# Patient Record
Sex: Female | Born: 1975 | Race: Black or African American | Hispanic: No | Marital: Married | Smoking: Never smoker
Health system: Southern US, Community
[De-identification: ages and names within clinical notes are randomized; demographics above are authoritative.]

## PROBLEM LIST (undated history)

## (undated) DIAGNOSIS — D573 Sickle-cell trait: Secondary | ICD-10-CM

## (undated) DIAGNOSIS — IMO0002 Reserved for concepts with insufficient information to code with codable children: Secondary | ICD-10-CM

## (undated) DIAGNOSIS — M1990 Unspecified osteoarthritis, unspecified site: Secondary | ICD-10-CM

## (undated) DIAGNOSIS — G43909 Migraine, unspecified, not intractable, without status migrainosus: Secondary | ICD-10-CM

## (undated) DIAGNOSIS — H269 Unspecified cataract: Secondary | ICD-10-CM

## (undated) DIAGNOSIS — K219 Gastro-esophageal reflux disease without esophagitis: Secondary | ICD-10-CM

## (undated) DIAGNOSIS — N63 Unspecified lump in unspecified breast: Secondary | ICD-10-CM

## (undated) HISTORY — PX: WISDOM TOOTH EXTRACTION: SHX21

## (undated) HISTORY — DX: Migraine, unspecified, not intractable, without status migrainosus: G43.909

## (undated) HISTORY — DX: Sickle-cell trait: D57.3

## (undated) HISTORY — DX: Unspecified osteoarthritis, unspecified site: M19.90

## (undated) HISTORY — DX: Unspecified cataract: H26.9

---

## 1999-06-09 ENCOUNTER — Encounter: Admission: RE | Admit: 1999-06-09 | Discharge: 1999-06-23 | Payer: Self-pay | Admitting: Internal Medicine

## 1999-09-04 ENCOUNTER — Encounter (INDEPENDENT_AMBULATORY_CARE_PROVIDER_SITE_OTHER): Payer: Self-pay | Admitting: *Deleted

## 1999-09-04 ENCOUNTER — Ambulatory Visit (HOSPITAL_BASED_OUTPATIENT_CLINIC_OR_DEPARTMENT_OTHER): Admission: RE | Admit: 1999-09-04 | Discharge: 1999-09-04 | Payer: Self-pay | Admitting: Orthopedic Surgery

## 1999-09-04 HISTORY — PX: WRIST GANGLION EXCISION: SHX840

## 2000-09-26 ENCOUNTER — Encounter: Payer: Self-pay | Admitting: Internal Medicine

## 2000-09-26 ENCOUNTER — Ambulatory Visit (HOSPITAL_COMMUNITY): Admission: RE | Admit: 2000-09-26 | Discharge: 2000-09-26 | Payer: Self-pay | Admitting: Internal Medicine

## 2001-09-18 ENCOUNTER — Other Ambulatory Visit: Admission: RE | Admit: 2001-09-18 | Discharge: 2001-09-18 | Payer: Self-pay | Admitting: Obstetrics and Gynecology

## 2002-01-14 ENCOUNTER — Inpatient Hospital Stay (HOSPITAL_COMMUNITY): Admission: AD | Admit: 2002-01-14 | Discharge: 2002-01-17 | Payer: Self-pay | Admitting: Obstetrics and Gynecology

## 2002-03-17 ENCOUNTER — Emergency Department (HOSPITAL_COMMUNITY): Admission: EM | Admit: 2002-03-17 | Discharge: 2002-03-17 | Payer: Self-pay | Admitting: Emergency Medicine

## 2002-08-25 ENCOUNTER — Other Ambulatory Visit: Admission: RE | Admit: 2002-08-25 | Discharge: 2002-08-25 | Payer: Self-pay | Admitting: Obstetrics and Gynecology

## 2003-05-25 ENCOUNTER — Ambulatory Visit (HOSPITAL_BASED_OUTPATIENT_CLINIC_OR_DEPARTMENT_OTHER): Admission: RE | Admit: 2003-05-25 | Discharge: 2003-05-25 | Payer: Self-pay | Admitting: Neurology

## 2003-09-06 ENCOUNTER — Encounter (INDEPENDENT_AMBULATORY_CARE_PROVIDER_SITE_OTHER): Payer: Self-pay | Admitting: *Deleted

## 2003-09-06 ENCOUNTER — Ambulatory Visit (HOSPITAL_BASED_OUTPATIENT_CLINIC_OR_DEPARTMENT_OTHER): Admission: RE | Admit: 2003-09-06 | Discharge: 2003-09-06 | Payer: Self-pay | Admitting: Orthopedic Surgery

## 2003-09-06 ENCOUNTER — Ambulatory Visit (HOSPITAL_COMMUNITY): Admission: RE | Admit: 2003-09-06 | Discharge: 2003-09-06 | Payer: Self-pay | Admitting: Orthopedic Surgery

## 2003-09-06 HISTORY — PX: WRIST GANGLION EXCISION: SHX840

## 2003-09-06 HISTORY — PX: DE QUERVAIN'S RELEASE: SHX1439

## 2003-09-09 ENCOUNTER — Other Ambulatory Visit: Admission: RE | Admit: 2003-09-09 | Discharge: 2003-09-09 | Payer: Self-pay | Admitting: Obstetrics and Gynecology

## 2004-06-28 ENCOUNTER — Ambulatory Visit: Payer: Self-pay | Admitting: Internal Medicine

## 2004-07-21 ENCOUNTER — Ambulatory Visit: Payer: Self-pay | Admitting: Internal Medicine

## 2004-07-28 ENCOUNTER — Ambulatory Visit: Payer: Self-pay | Admitting: Internal Medicine

## 2004-09-11 ENCOUNTER — Other Ambulatory Visit: Admission: RE | Admit: 2004-09-11 | Discharge: 2004-09-11 | Payer: Self-pay | Admitting: Obstetrics and Gynecology

## 2004-09-13 ENCOUNTER — Ambulatory Visit: Payer: Self-pay | Admitting: Internal Medicine

## 2004-10-27 ENCOUNTER — Ambulatory Visit: Payer: Self-pay | Admitting: Internal Medicine

## 2005-01-01 ENCOUNTER — Ambulatory Visit: Payer: Self-pay | Admitting: Internal Medicine

## 2005-01-17 ENCOUNTER — Ambulatory Visit: Payer: Self-pay | Admitting: Internal Medicine

## 2005-06-08 ENCOUNTER — Ambulatory Visit: Payer: Self-pay | Admitting: Internal Medicine

## 2005-12-26 ENCOUNTER — Inpatient Hospital Stay (HOSPITAL_COMMUNITY): Admission: AD | Admit: 2005-12-26 | Discharge: 2005-12-28 | Payer: Self-pay | Admitting: Obstetrics and Gynecology

## 2005-12-27 ENCOUNTER — Encounter (INDEPENDENT_AMBULATORY_CARE_PROVIDER_SITE_OTHER): Payer: Self-pay | Admitting: Specialist

## 2005-12-27 HISTORY — PX: TUBAL LIGATION: SHX77

## 2006-02-05 ENCOUNTER — Other Ambulatory Visit: Admission: RE | Admit: 2006-02-05 | Discharge: 2006-02-05 | Payer: Self-pay | Admitting: Obstetrics and Gynecology

## 2006-02-14 ENCOUNTER — Encounter: Admission: RE | Admit: 2006-02-14 | Discharge: 2006-02-14 | Payer: Self-pay | Admitting: Obstetrics and Gynecology

## 2006-04-03 ENCOUNTER — Ambulatory Visit: Payer: Self-pay | Admitting: Internal Medicine

## 2006-09-05 ENCOUNTER — Encounter: Admission: RE | Admit: 2006-09-05 | Discharge: 2006-09-05 | Payer: Self-pay | Admitting: Obstetrics and Gynecology

## 2006-10-02 ENCOUNTER — Ambulatory Visit: Payer: Self-pay | Admitting: Internal Medicine

## 2006-10-02 LAB — CONVERTED CEMR LAB
ALT: 14 units/L (ref 0–40)
Alkaline Phosphatase: 39 units/L (ref 39–117)
BUN: 11 mg/dL (ref 6–23)
Bilirubin Urine: NEGATIVE
Bilirubin, Direct: 0.1 mg/dL (ref 0.0–0.3)
Calcium: 9.2 mg/dL (ref 8.4–10.5)
Cholesterol: 155 mg/dL (ref 0–200)
Eosinophils Absolute: 0.1 10*3/uL (ref 0.0–0.6)
GFR calc Af Amer: 95 mL/min
GFR calc non Af Amer: 78 mL/min
Glucose, Bld: 95 mg/dL (ref 70–99)
Hemoglobin, Urine: NEGATIVE
Lymphocytes Relative: 34.1 % (ref 12.0–46.0)
MCV: 88.8 fL (ref 78.0–100.0)
Monocytes Relative: 6.7 % (ref 3.0–11.0)
Neutro Abs: 2.3 10*3/uL (ref 1.4–7.7)
Platelets: 212 10*3/uL (ref 150–400)
Potassium: 4.3 meq/L (ref 3.5–5.1)
Total CHOL/HDL Ratio: 2.5
Triglycerides: 31 mg/dL (ref 0–149)
Urine Glucose: NEGATIVE mg/dL

## 2006-10-07 ENCOUNTER — Ambulatory Visit: Payer: Self-pay | Admitting: Internal Medicine

## 2006-10-16 ENCOUNTER — Ambulatory Visit: Payer: Self-pay | Admitting: Internal Medicine

## 2006-10-16 LAB — CONVERTED CEMR LAB
Rhuematoid fact SerPl-aCnc: <20 intl units/mL — ABNORMAL LOW (ref 0.0–20.0)
Sed Rate: 9 mm/hr (ref 0–25)
Total CK: 66 units/L (ref 7–177)

## 2007-01-02 ENCOUNTER — Ambulatory Visit: Payer: Self-pay | Admitting: Endocrinology

## 2007-01-03 ENCOUNTER — Encounter: Payer: Self-pay | Admitting: Endocrinology

## 2007-03-06 ENCOUNTER — Ambulatory Visit: Payer: Self-pay | Admitting: Internal Medicine

## 2007-03-27 ENCOUNTER — Ambulatory Visit: Payer: Self-pay | Admitting: Internal Medicine

## 2007-05-20 ENCOUNTER — Ambulatory Visit: Payer: Self-pay | Admitting: Internal Medicine

## 2007-05-20 ENCOUNTER — Encounter: Payer: Self-pay | Admitting: Internal Medicine

## 2007-05-20 DIAGNOSIS — D573 Sickle-cell trait: Secondary | ICD-10-CM | POA: Insufficient documentation

## 2007-05-20 DIAGNOSIS — J309 Allergic rhinitis, unspecified: Secondary | ICD-10-CM | POA: Insufficient documentation

## 2007-05-20 DIAGNOSIS — G43009 Migraine without aura, not intractable, without status migrainosus: Secondary | ICD-10-CM | POA: Insufficient documentation

## 2007-05-31 ENCOUNTER — Encounter: Payer: Self-pay | Admitting: Endocrinology

## 2007-05-31 DIAGNOSIS — G4700 Insomnia, unspecified: Secondary | ICD-10-CM | POA: Insufficient documentation

## 2007-06-04 ENCOUNTER — Encounter: Payer: Self-pay | Admitting: Internal Medicine

## 2007-06-04 ENCOUNTER — Ambulatory Visit: Payer: Self-pay | Admitting: Internal Medicine

## 2007-06-18 ENCOUNTER — Telehealth (INDEPENDENT_AMBULATORY_CARE_PROVIDER_SITE_OTHER): Payer: Self-pay | Admitting: *Deleted

## 2007-07-03 ENCOUNTER — Ambulatory Visit: Payer: Self-pay | Admitting: Internal Medicine

## 2007-07-03 DIAGNOSIS — J019 Acute sinusitis, unspecified: Secondary | ICD-10-CM | POA: Insufficient documentation

## 2007-07-24 ENCOUNTER — Ambulatory Visit: Payer: Self-pay | Admitting: Internal Medicine

## 2007-07-31 ENCOUNTER — Encounter: Payer: Self-pay | Admitting: Internal Medicine

## 2007-09-01 ENCOUNTER — Telehealth (INDEPENDENT_AMBULATORY_CARE_PROVIDER_SITE_OTHER): Payer: Self-pay | Admitting: *Deleted

## 2007-09-03 ENCOUNTER — Ambulatory Visit: Payer: Self-pay | Admitting: Internal Medicine

## 2007-09-03 DIAGNOSIS — E559 Vitamin D deficiency, unspecified: Secondary | ICD-10-CM

## 2007-09-03 HISTORY — DX: Vitamin D deficiency, unspecified: E55.9

## 2007-09-03 LAB — CONVERTED CEMR LAB
ALT: 14 units/L (ref 0–35)
AST: 14 units/L (ref 0–37)
Albumin: 3.9 g/dL (ref 3.5–5.2)
Basophils Absolute: 0 10*3/uL (ref 0.0–0.1)
Bilirubin Urine: NEGATIVE
Bilirubin, Direct: 0.1 mg/dL (ref 0.0–0.3)
Calcium: 9.4 mg/dL (ref 8.4–10.5)
Chloride: 105 meq/L (ref 96–112)
Eosinophils Absolute: 0.1 10*3/uL (ref 0.0–0.6)
GFR calc Af Amer: 94 mL/min
GFR calc non Af Amer: 78 mL/min
Glucose, Bld: 113 mg/dL — ABNORMAL HIGH (ref 70–99)
Ketones, ur: NEGATIVE mg/dL
Lymphocytes Relative: 35.6 % (ref 12.0–46.0)
MCHC: 34.9 g/dL (ref 30.0–36.0)
MCV: 87.9 fL (ref 78.0–100.0)
Monocytes Relative: 6.5 % (ref 3.0–11.0)
Neutro Abs: 3.3 10*3/uL (ref 1.4–7.7)
Nitrite: NEGATIVE
Platelets: 242 10*3/uL (ref 150–400)
RBC: 3.98 M/uL (ref 3.87–5.11)
Urine Glucose: NEGATIVE mg/dL
Vitamin B-12: 415 pg/mL (ref 211–911)
WBC: 5.9 10*3/uL (ref 4.5–10.5)

## 2007-09-06 ENCOUNTER — Encounter: Payer: Self-pay | Admitting: Internal Medicine

## 2007-09-09 LAB — CONVERTED CEMR LAB
ALT: 14 units/L (ref 0–35)
AST: 14 units/L (ref 0–37)
Alkaline Phosphatase: 46 units/L (ref 39–117)
BUN: 10 mg/dL (ref 6–23)
Basophils Relative: 0.5 % (ref 0.0–1.0)
Bilirubin Urine: NEGATIVE
CO2: 28 meq/L (ref 19–32)
Calcium: 9.4 mg/dL (ref 8.4–10.5)
Chloride: 105 meq/L (ref 96–112)
Creatinine, Ser: 0.9 mg/dL (ref 0.4–1.2)
Crystals: NEGATIVE
Eosinophils Relative: 1.6 % (ref 0.0–5.0)
GFR calc Af Amer: 94 mL/min
Glucose, Bld: 113 mg/dL — ABNORMAL HIGH (ref 70–99)
Monocytes Relative: 6.5 % (ref 3.0–11.0)
Mucus, UA: NEGATIVE
Nitrite: NEGATIVE
Platelets: 242 10*3/uL (ref 150–400)
RBC: 3.98 M/uL (ref 3.87–5.11)
RDW: 12.8 % (ref 11.5–14.6)
Specific Gravity, Urine: 1.025 (ref 1.000–1.03)
Total Protein, Urine: NEGATIVE mg/dL
Total Protein: 6.5 g/dL (ref 6.0–8.3)
Urine Glucose: NEGATIVE mg/dL
Vit D, 1,25-Dihydroxy: 7 — ABNORMAL LOW (ref 30–89)
WBC: 5.9 10*3/uL (ref 4.5–10.5)
pH: 5 (ref 5.0–8.0)

## 2007-09-10 ENCOUNTER — Encounter: Payer: Self-pay | Admitting: Internal Medicine

## 2007-09-10 ENCOUNTER — Telehealth: Payer: Self-pay | Admitting: Internal Medicine

## 2007-10-21 ENCOUNTER — Telehealth: Payer: Self-pay | Admitting: Internal Medicine

## 2007-12-10 ENCOUNTER — Ambulatory Visit: Payer: Self-pay | Admitting: Internal Medicine

## 2007-12-10 DIAGNOSIS — R635 Abnormal weight gain: Secondary | ICD-10-CM | POA: Insufficient documentation

## 2008-01-23 ENCOUNTER — Ambulatory Visit: Payer: Self-pay | Admitting: Internal Medicine

## 2008-01-23 DIAGNOSIS — R110 Nausea: Secondary | ICD-10-CM | POA: Insufficient documentation

## 2008-03-26 ENCOUNTER — Ambulatory Visit: Payer: Self-pay | Admitting: Internal Medicine

## 2008-03-26 DIAGNOSIS — R079 Chest pain, unspecified: Secondary | ICD-10-CM | POA: Insufficient documentation

## 2008-05-10 ENCOUNTER — Ambulatory Visit: Payer: Self-pay | Admitting: Internal Medicine

## 2008-05-10 DIAGNOSIS — R059 Cough, unspecified: Secondary | ICD-10-CM | POA: Insufficient documentation

## 2008-05-10 DIAGNOSIS — R05 Cough: Secondary | ICD-10-CM | POA: Insufficient documentation

## 2008-05-10 DIAGNOSIS — J158 Pneumonia due to other specified bacteria: Secondary | ICD-10-CM | POA: Insufficient documentation

## 2008-05-11 ENCOUNTER — Encounter: Payer: Self-pay | Admitting: Internal Medicine

## 2008-05-19 ENCOUNTER — Telehealth: Payer: Self-pay | Admitting: Internal Medicine

## 2008-05-21 ENCOUNTER — Ambulatory Visit: Payer: Self-pay | Admitting: Internal Medicine

## 2008-05-21 ENCOUNTER — Encounter: Payer: Self-pay | Admitting: Internal Medicine

## 2008-06-15 ENCOUNTER — Telehealth: Payer: Self-pay | Admitting: Internal Medicine

## 2008-06-15 ENCOUNTER — Ambulatory Visit: Payer: Self-pay | Admitting: Internal Medicine

## 2008-07-02 ENCOUNTER — Encounter: Admission: RE | Admit: 2008-07-02 | Discharge: 2008-07-02 | Payer: Self-pay | Admitting: Internal Medicine

## 2008-08-01 ENCOUNTER — Telehealth: Payer: Self-pay | Admitting: Family Medicine

## 2008-09-21 ENCOUNTER — Telehealth: Payer: Self-pay | Admitting: Internal Medicine

## 2008-10-26 ENCOUNTER — Telehealth: Payer: Self-pay | Admitting: Internal Medicine

## 2008-10-27 ENCOUNTER — Ambulatory Visit: Payer: Self-pay | Admitting: Internal Medicine

## 2008-10-28 LAB — CONVERTED CEMR LAB
Albumin: 3.7 g/dL (ref 3.5–5.2)
Alkaline Phosphatase: 39 units/L (ref 39–117)
BUN: 9 mg/dL (ref 6–23)
Basophils Absolute: 0 10*3/uL (ref 0.0–0.1)
Basophils Relative: 0.3 % (ref 0.0–3.0)
CO2: 30 meq/L (ref 19–32)
Calcium: 9 mg/dL (ref 8.4–10.5)
Eosinophils Absolute: 0.1 10*3/uL (ref 0.0–0.7)
Glucose, Bld: 93 mg/dL (ref 70–99)
HCT: 34.3 % — ABNORMAL LOW (ref 36.0–46.0)
HDL: 70.7 mg/dL (ref >39.00)
Hemoglobin, Urine: NEGATIVE
Leukocytes, UA: NEGATIVE
Lymphocytes Relative: 49.7 % — ABNORMAL HIGH (ref 12.0–46.0)
Lymphs Abs: 1.7 10*3/uL (ref 0.7–4.0)
MCHC: 33.8 g/dL (ref 30.0–36.0)
Monocytes Absolute: 0.2 10*3/uL (ref 0.1–1.0)
Neutro Abs: 1.3 10*3/uL — ABNORMAL LOW (ref 1.4–7.7)
RBC: 3.86 M/uL — ABNORMAL LOW (ref 3.87–5.11)
RDW: 13.1 % (ref 11.5–14.6)
Sodium: 142 meq/L (ref 135–145)
Specific Gravity, Urine: 1.01 (ref 1.000–1.030)
TSH: 1.51 microintl units/mL (ref 0.35–5.50)
Total CHOL/HDL Ratio: 2
Total Protein: 6.5 g/dL (ref 6.0–8.3)
Urobilinogen, UA: 0.2 (ref 0.0–1.0)
VLDL: 6.2 mg/dL (ref 0.0–40.0)
WBC: 3.3 10*3/uL — ABNORMAL LOW (ref 4.5–10.5)

## 2008-11-01 ENCOUNTER — Ambulatory Visit: Payer: Self-pay | Admitting: Internal Medicine

## 2008-11-01 DIAGNOSIS — R209 Unspecified disturbances of skin sensation: Secondary | ICD-10-CM | POA: Insufficient documentation

## 2008-11-02 LAB — CONVERTED CEMR LAB
Folate: 9 ng/mL
Vitamin B-12: 417 pg/mL (ref 211–911)

## 2008-12-08 ENCOUNTER — Ambulatory Visit: Payer: Self-pay | Admitting: Internal Medicine

## 2009-01-05 ENCOUNTER — Encounter: Admission: RE | Admit: 2009-01-05 | Discharge: 2009-01-05 | Payer: Self-pay | Admitting: Obstetrics and Gynecology

## 2009-04-29 ENCOUNTER — Telehealth: Payer: Self-pay | Admitting: Internal Medicine

## 2009-05-28 ENCOUNTER — Ambulatory Visit: Payer: Self-pay | Admitting: Family Medicine

## 2009-06-20 ENCOUNTER — Ambulatory Visit: Payer: Self-pay | Admitting: Internal Medicine

## 2009-06-30 ENCOUNTER — Ambulatory Visit: Payer: Self-pay | Admitting: Internal Medicine

## 2009-06-30 DIAGNOSIS — J40 Bronchitis, not specified as acute or chronic: Secondary | ICD-10-CM | POA: Insufficient documentation

## 2009-10-14 ENCOUNTER — Ambulatory Visit: Payer: Self-pay | Admitting: Internal Medicine

## 2010-01-20 ENCOUNTER — Ambulatory Visit: Payer: Self-pay | Admitting: Internal Medicine

## 2010-02-16 ENCOUNTER — Ambulatory Visit: Payer: Self-pay | Admitting: Internal Medicine

## 2010-02-16 DIAGNOSIS — J069 Acute upper respiratory infection, unspecified: Secondary | ICD-10-CM | POA: Insufficient documentation

## 2010-02-16 DIAGNOSIS — R350 Frequency of micturition: Secondary | ICD-10-CM | POA: Insufficient documentation

## 2010-02-16 LAB — CONVERTED CEMR LAB
Bilirubin Urine: NEGATIVE
Hemoglobin, Urine: NEGATIVE
Nitrite: NEGATIVE
Total Protein, Urine: NEGATIVE mg/dL
Urine Glucose: NEGATIVE mg/dL
pH: 6 (ref 5.0–8.0)

## 2010-05-22 ENCOUNTER — Ambulatory Visit: Payer: Self-pay | Admitting: Internal Medicine

## 2010-05-22 LAB — CONVERTED CEMR LAB
BUN: 14 mg/dL (ref 6–23)
Basophils Relative: 0.7 % (ref 0.0–3.0)
CO2: 27 meq/L (ref 19–32)
Chloride: 107 meq/L (ref 96–112)
Creatinine, Ser: 0.9 mg/dL (ref 0.4–1.2)
Eosinophils Absolute: 0.1 10*3/uL (ref 0.0–0.7)
Eosinophils Relative: 1.8 % (ref 0.0–5.0)
HCT: 35.2 % — ABNORMAL LOW (ref 36.0–46.0)
Lymphs Abs: 1.7 10*3/uL (ref 0.7–4.0)
MCHC: 34.1 g/dL (ref 30.0–36.0)
MCV: 90.3 fL (ref 78.0–100.0)
Monocytes Absolute: 0.5 10*3/uL (ref 0.1–1.0)
Potassium: 4.8 meq/L (ref 3.5–5.1)
RBC: 3.89 M/uL (ref 3.87–5.11)
WBC: 5.9 10*3/uL (ref 4.5–10.5)

## 2010-05-26 ENCOUNTER — Ambulatory Visit: Payer: Self-pay | Admitting: Internal Medicine

## 2010-06-21 ENCOUNTER — Telehealth: Payer: Self-pay | Admitting: Internal Medicine

## 2010-08-30 ENCOUNTER — Telehealth: Payer: Self-pay | Admitting: Internal Medicine

## 2010-09-03 ENCOUNTER — Encounter: Payer: Self-pay | Admitting: Obstetrics and Gynecology

## 2010-09-14 NOTE — Assessment & Plan Note (Signed)
Summary: 4 MTH FU  STC   Vital Signs:  Patient profile:   35 year old female Height:      66 inches Weight:      176 pounds BMI:     28.51 Temp:     98.5 degrees F oral Pulse rate:   84 / minute Pulse rhythm:   regular Resp:     16 per minute BP sitting:   110 / 60  (left arm) Cuff size:   regular  Vitals Entered By: Lanier Prude, CMA(AAMA) (May 26, 2010 8:56 AM) CC: 4 mo f/u Is Patient Diabetic? No   Primary Care Provider:  Tresa Garter MD  CC:  4 mo f/u.  History of Present Illness: The patient presents for a follow up ofallergies, insomnia and headaches. Doing well   Current Medications (verified): 1)  Vitamin D3 1000 Unit  Tabs (Cholecalciferol) .Marland Kitchen.. 1 Once Daily By Mouth 2)  Tramadol Hcl 50 Mg  Tabs (Tramadol Hcl) .Marland Kitchen.. 1-2 By Mouth Two Times A Day As Needed Pain 3)  Triazolam 0.25 Mg Tabs (Triazolam) .Marland Kitchen.. 1 or 2 Tabs By Mouth At Bedtime As Needed Insomnia 4)  Cetirizine Hcl 10 Mg Tabs (Cetirizine Hcl) .Marland Kitchen.. 1 By Mouth Once Daily As Needed Allergies  Allergies (verified): 1)  Topamax (Topiramate) 2)  Imitrex (Sumatriptan Succinate) 3)  Naprosyn 4)  Trazodone Hcl (Trazodone Hcl) 5)  Phentermine Hcl (Phentermine Hcl)  Past History:  Past Medical History: Last updated: 12/10/2007 Allergic rhinitis Migraine HA Sickle cell trait Insomnia Vit D def 2009  Gyn Dr Su Hilt  Social History: Last updated: 12/08/2008 Never Smoked Alcohol use-no Married, 2 kids w/SS  Review of Systems  The patient denies fever, weight loss, weight gain, chest pain, abdominal pain, and depression.    Physical Exam  General:  alert and overweight-appearing, fatigued, mild ill .   Nose:  nasal dischargemucosal pallor and mucosal edema.   Mouth:  pharyngeal erythema and fair dentition.   Neck:  supple and cervical lymphadenopathy.   Lungs:  normal respiratory effort and normal breath sounds.   Heart:  normal rate and regular rhythm.   Abdomen:  soft and normal  bowel sounds.  with low mid abd tender without guarding or rebound Msk:  No deformity or scoliosis noted of thoracic or lumbar spine.   Neurologic:  No cranial nerve deficits noted. Station and gait are normal. Plantar reflexes are down-going bilaterally. DTRs are symmetrical throughout. Sensory, motor and coordinative functions appear intact. Skin:  Intact without suspicious lesions or rashes Psych:  Oriented X3, normally interactive, good eye contact, and subdued.     Impression & Recommendations:  Problem # 1:  INSOMNIA (ICD-780.52) Assessment Improved  Her updated medication list for this problem includes:    Triazolam 0.25 Mg Tabs (Triazolam) .Marland Kitchen... 1 or 2 tabs by mouth at bedtime as needed insomnia  Problem # 2:  ALLERGIC RHINITIS (ICD-477.9) Assessment: Unchanged  Her updated medication list for this problem includes:    Cetirizine Hcl 10 Mg Tabs (Cetirizine hcl) .Marland Kitchen... 1 by mouth once daily as needed allergies  Problem # 3:  FATIGUE (ICD-780.79) Assessment: Improved  Problem # 4:  COMMON MIGRAINE (ICD-346.10) Assessment: Improved  Her updated medication list for this problem includes:    Tramadol Hcl 50 Mg Tabs (Tramadol hcl) .Marland Kitchen... 1-2 by mouth two times a day as needed pain  Complete Medication List: 1)  Vitamin D3 1000 Unit Tabs (Cholecalciferol) .Marland Kitchen.. 1 once daily by mouth 2)  Tramadol Hcl  50 Mg Tabs (Tramadol hcl) .Marland Kitchen.. 1-2 by mouth two times a day as needed pain 3)  Triazolam 0.25 Mg Tabs (Triazolam) .Marland Kitchen.. 1 or 2 tabs by mouth at bedtime as needed insomnia 4)  Cetirizine Hcl 10 Mg Tabs (Cetirizine hcl) .Marland Kitchen.. 1 by mouth once daily as needed allergies  Other Orders: Admin 1st Vaccine (71062) Flu Vaccine 25yrs + (69485)  Patient Instructions: 1)  Please schedule a follow-up appointment in 6 months well w/labs. Prescriptions: CETIRIZINE HCL 10 MG TABS (CETIRIZINE HCL) 1 by mouth once daily as needed allergies  #90 x 3   Entered and Authorized by:   Tresa Garter  MD   Signed by:   Tresa Garter MD on 05/26/2010   Method used:   Print then Give to Patient   RxID:   [id number]   .lbflu   Flu Vaccine Consent Questions     Do you have a history of severe allergic reactions to this vaccine? no    Any prior history of allergic reactions to egg and/or gelatin? no    Do you have a sensitivity to the preservative Thimersol? no    Do you have a past history of Guillan-Barre Syndrome? no    Do you currently have an acute febrile illness? no    Have you ever had a severe reaction to latex? no    Vaccine information given and explained to patient? yes    Are you currently pregnant? no    Lot Number:[id number]   Exp Date:02/10/2011   Site Given  Left Deltoid IM Lanier Prude, [facility])  May 26, 2010 9:14 AM

## 2010-09-14 NOTE — Assessment & Plan Note (Signed)
Summary: 3 MO ROV /NWS #   Vital Signs:  Patient profile:   35 year old female Weight:      170 pounds Temp:     98.3 degrees F Pulse rate:   88 / minute BP sitting:   106 / 66  Primary Care Provider:  Tresa Garter MD   History of Present Illness: The patient presents for a follow up of hypertension, diabetes, hyperlipidemia   Allergies: 1)  Topamax (Topiramate) 2)  Imitrex (Sumatriptan Succinate) 3)  Naprosyn 4)  Trazodone Hcl (Trazodone Hcl) 5)  Phentermine Hcl (Phentermine Hcl)  Past History:  Past Medical History: Last updated: 12/10/2007 Allergic rhinitis Migraine HA Sickle cell trait Insomnia Vit D def 2009  Gyn Dr Su Hilt  Social History: Last updated: 12/08/2008 Never Smoked Alcohol use-no Married, 2 kids w/SS  Review of Systems  The patient denies fever, dyspnea on exertion, and abdominal pain.    Physical Exam  General:  overweight-appearing.   Nose:  WNL Mouth:  WNL Lungs:  normal respiratory effort, no intercostal retractions or use of accessory muscles; few scattered rhonchi but no crackles and no wheezes.    Heart:  normal rate, regular rhythm, no murmur, and no rub. BLE without edema.  Abdomen:  Bowel sounds positive,abdomen soft and non-tender without masses, organomegaly or hernias noted. Msk:  No deformity or scoliosis noted of thoracic or lumbar spine.   Neurologic:  No cranial nerve deficits noted. Station and gait are normal. Plantar reflexes are down-going bilaterally. DTRs are symmetrical throughout. Sensory, motor and coordinative functions appear intact. Skin:  Intact without suspicious lesions or rashes Psych:  Oriented X3, normally interactive, good eye contact, and subdued.     Impression & Recommendations:  Problem # 1:  SICKLE-CELL TRAIT (ICD-282.5) Assessment Unchanged  Problem # 2:  WEIGHT GAIN (ICD-783.1) Assessment: Improved On diet  Problem # 3:  VITAMIN D DEFICIENCY (ICD-268.9) Assessment:  Unchanged  Problem # 4:  INSOMNIA (ICD-780.52) Assessment: Unchanged  Her updated medication list for this problem includes:    Triazolam 0.25 Mg Tabs (Triazolam) .Marland Kitchen... 1 or 2 tabs by mouth at bedtime as needed insomnia  Complete Medication List: 1)  Vitamin D3 1000 Unit Tabs (Cholecalciferol) .Marland Kitchen.. 1 once daily by mouth 2)  Tramadol Hcl 50 Mg Tabs (Tramadol hcl) .Marland Kitchen.. 1-2 by mouth two times a day as needed pain 3)  Triazolam 0.25 Mg Tabs (Triazolam) .Marland Kitchen.. 1 or 2 tabs by mouth at bedtime as needed insomnia 4)  Cetirizine Hcl 10 Mg Tabs (Cetirizine hcl) .Marland Kitchen.. 1 by mouth once daily as needed allergies  Patient Instructions: 1)  Please schedule a follow-up appointment in 4 months. 2)  BMP prior to visit, ICD-9: 3)  CBC w/ Diff prior to visit, ICD-9:995.20 Prescriptions: VITAMIN D3 1000 UNIT  TABS (CHOLECALCIFEROL) 1 once daily by mouth  #100 x 3   Entered and Authorized by:   Tresa Garter MD   Signed by:   Tresa Garter MD on 01/20/2010   Method used:   Print then Give to Patient   RxID:   [id number] CETIRIZINE HCL 10 MG TABS (CETIRIZINE HCL) 1 by mouth once daily as needed allergies  #90 x 3   Entered and Authorized by:   Tresa Garter MD   Signed by:   Tresa Garter MD on 01/20/2010   Method used:   Print then Give to Patient   RxID:   [id number] TRIAZOLAM 0.25 MG TABS (TRIAZOLAM) 1 or 2 tabs by  mouth at bedtime as needed insomnia  #60 x 6   Entered and Authorized by:   Tresa Garter MD   Signed by:   Tresa Garter MD on 01/20/2010   Method used:   Print then Give to Patient   RxID:   [id number]

## 2010-09-14 NOTE — Assessment & Plan Note (Signed)
Summary: 4 MO ROV /NWS #   Vital Signs:  Patient profile:   35 year old female Weight:      174 pounds Temp:     97.6 degrees F oral Pulse rate:   59 / minute BP sitting:   92 / 70  (left arm)  Vitals Entered By: Tora Perches (October 14, 2009 8:52 AM) CC: f/u Is Patient Diabetic? No   Primary Care Provider:  Tresa Garter MD  CC:  f/u.  History of Present Illness: The patient presents for a follow up of insomnia and fatigue. C/o wt gain   Preventive Screening-Counseling & Management  Alcohol-Tobacco     Smoking Status: never  Current Medications (verified): 1)  Loratadine 10 Mg  Tabs (Loratadine) .... Once Daily As Needed Allergies 2)  Vitamin D3 1000 Unit  Tabs (Cholecalciferol) .Marland Kitchen.. 1 Qd 3)  Tramadol Hcl 50 Mg  Tabs (Tramadol Hcl) .Marland Kitchen.. 1-2 By Mouth Two Times A Day As Needed Pain  Allergies: 1)  Topamax (Topiramate) 2)  Imitrex (Sumatriptan Succinate) 3)  Naprosyn 4)  Trazodone Hcl (Trazodone Hcl)  Past History:  Past Medical History: Last updated: 12/10/2007 Allergic rhinitis Migraine HA Sickle cell trait Insomnia Vit D def 2009  Gyn Dr Su Hilt  Social History: Last updated: 12/08/2008 Never Smoked Alcohol use-no Married, 2 kids w/SS  Review of Systems       The patient complains of weight gain.  The patient denies fever, chest pain, and abdominal pain.    Physical Exam  General:  overweight-appearing.   Nose:  WNL Mouth:  WNL Neck:  No deformities, masses, or tenderness noted. Lungs:  normal respiratory effort, no intercostal retractions or use of accessory muscles; few scattered rhonchi but no crackles and no wheezes.    Heart:  normal rate, regular rhythm, no murmur, and no rub. BLE without edema.  Neurologic:  No cranial nerve deficits noted. Station and gait are normal. Plantar reflexes are down-going bilaterally. DTRs are symmetrical throughout. Sensory, motor and coordinative functions appear intact. Skin:  Intact without  suspicious lesions or rashes   Impression & Recommendations:  Problem # 1:  FATIGUE (ICD-780.79) Assessment Unchanged Rest more  Problem # 2:  INSOMNIA (ICD-780.52) Assessment: Unchanged  Her updated medication list for this problem includes:    Triazolam 0.25 Mg Tabs (Triazolam) .Marland Kitchen... 1 by mouth at bedtime as needed insomnia  Problem # 3:  SICKLE-CELL TRAIT (ICD-282.5) Assessment: Unchanged  Problem # 4:  COMMON MIGRAINE (ICD-346.10) Assessment: Improved  The following medications were removed from the medication list:    Tylenol 325 Mg Tabs (Acetaminophen) .Marland Kitchen... As needed Her updated medication list for this problem includes:    Tramadol Hcl 50 Mg Tabs (Tramadol hcl) .Marland Kitchen... 1-2 by mouth two times a day as needed pain  Problem # 5:  WEIGHT GAIN (ICD-783.1) Assessment: Deteriorated Given Phentermine Risks vs benefits and controversies of a long term wt loss meds use were discussed.  See "Patient Instructions".   Complete Medication List: 1)  Loratadine 10 Mg Tabs (Loratadine) .... Once daily as needed allergies 2)  Vitamin D3 1000 Unit Tabs (Cholecalciferol) .Marland Kitchen.. 1 qd 3)  Tramadol Hcl 50 Mg Tabs (Tramadol hcl) .Marland Kitchen.. 1-2 by mouth two times a day as needed pain 4)  Phentermine Hcl 37.5 Mg Tabs (Phentermine hcl) .Marland Kitchen.. 1 by mouth qam 5)  Triazolam 0.25 Mg Tabs (Triazolam) .Marland Kitchen.. 1 by mouth at bedtime as needed insomnia  Patient Instructions: 1)  Please schedule a follow-up appointment in  3 months. 2)  Try to eat more raw plant food, fresh and dry fruit, raw almonds, leafy vegetables, whole foods and less red meat, less animal fat. Poultry and fish is better for you than pork and beef. Avoid processed foods (canned soups, hot dogs, sausage, bacon , frozen dinners). Avoid corn syrup, high fructose syrup or aspartam and Splenda  containing drinks. Honey, Agave and Stevia are better sweeteners. Make your own  dressing with olive oil, wine vinegar, lemon juce, garlic etc. for your  salads. Prescriptions: TRIAZOLAM 0.25 MG TABS (TRIAZOLAM) 1 by mouth at bedtime as needed insomnia  #30 x 5   Entered and Authorized by:   Tresa Garter MD   Signed by:   Tresa Garter MD on 10/14/2009   Method used:   Print then Give to Patient   RxID:   [id number] PHENTERMINE HCL 37.5 MG TABS (PHENTERMINE HCL) 1 by mouth qam  #30 x 2   Entered and Authorized by:   Tresa Garter MD   Signed by:   Tresa Garter MD on 10/14/2009   Method used:   Print then Give to Patient   RxID:   [id number]

## 2010-09-14 NOTE — Letter (Signed)
Summary: Generic Letter  [facility]  [address] [place], [place] [address]   Phone: [phone]  Fax: [phone]    05/26/2010  Angel [name] [address] [place], [place]  [address]  The above named patient received her flu vaccine in our office on 05/26/10.  If you have any questions please contact our office.    Sincerely,   Lanier Prude, CMA(AAMA)for Dr. Mervyn Skeeters. Plotnikov

## 2010-09-14 NOTE — Progress Notes (Signed)
Summary: Diarrhea  Phone Note Call from Patient   Caller: Patient Summary of Call: c/o diarrhea after meals since eating [place] food monday.  She also c/o some abd cramping after meals. I advised pt to get Immodium and try and if this did not help or she develops new symptoms to call us. Pt understands and agrees. Initial call taken by: Lanier Prude, [facility]),  June 21, 2010 10:35 AM  Follow-up for Phone Call        agree Thank you!  Follow-up by: Tresa Garter MD,  June 22, 2010 1:39 PM

## 2010-09-14 NOTE — Assessment & Plan Note (Signed)
Summary: DIZZY--LOW BP--DR AVP PT/NO SLOT--STC   Vital Signs:  Patient profile:   35 year old female Height:      66 inches Weight:      173.25 pounds BMI:     28.06 O2 Sat:      98 % on Room air Temp:     99 degrees F oral Pulse rate:   75 / minute BP supine:   110 / 72 BP sitting:   100 / 64  (left arm) BP standing:   100 / 68 Cuff size:   regular  Vitals Entered By: Zella Ball Ewing CMA (AAMA) (February 16, 2010 4:26 PM)  O2 Flow:  Room air CC: Dizzy, Low BP, fatigue for 1 week/RE   Primary Care Provider:  Georgina Quint Plotnikov MD  CC:  Dizzy, Low BP, and fatigue for 1 week/RE.  History of Present Illness: here with acute onset fatigue and dizziness for approx 1 wk;  son with strep throat on antibx per peds, has felt somewhat warm but was unaware of any fever until she arrived here;  has some mild sinus pressure but no headache or earache;  did have some ST to start but improved;  has had some decreased by mouth intake as well, and today with mild orthostasis not on diuretic or recent outdoor activities to cause sweating.  no n/v/d, chills, abd pain , flank pain or bowel change or blood, but has had some mild urinary freq as well for several days, assoc with an episode of urinary incontinence when she awoke 2 days ago;  no clear dysuria, urgency or hematuria, flank pain or back pain.  Pt denies CP, sob, doe, wheezing, orthopnea, pnd, worsening LE edema, palps, or syncope .  Pt denies new neuro symptoms such as headache, facial or extremity weakness Overall good complaicne with meds, good tolerability.  Problems Prior to Update: 1)  Dehydration  (ICD-276.51) 2)  Fatigue  (ICD-780.79) 3)  Frequency, Urinary  (ICD-788.41) 4)  Uri  (ICD-465.9) 5)  Bronchitis  (ICD-490) 6)  Fatigue  (ICD-780.79) 7)  Paresthesia  (ICD-782.0) 8)  Well Adult Exam  (ICD-V70.0) 9)  Abdominal Pain, Right Upper Quadrant  (ICD-789.01) 10)  Pneumonia Due To Other Specified Bacteria  (ICD-482.89) 11)  Cough   (ICD-786.2) 12)  Chest Pain, Unspecified  (ICD-786.50) 13)  Nausea Alone  (ICD-787.02) 14)  Weight Gain  (ICD-783.1) 15)  Vitamin D Deficiency  (ICD-268.9) 16)  Fatigue  (ICD-780.79) 17)  Sinusitis- Acute-nos  (ICD-461.9) 18)  Sinusitis- Acute-nos  (ICD-461.9) 19)  Insomnia  (ICD-780.52) 20)  Sickle-cell Trait  (ICD-282.5) 21)  Common Migraine  (ICD-346.10) 22)  Allergic Rhinitis  (ICD-477.9)  Medications Prior to Update: 1)  Vitamin D3 1000 Unit  Tabs (Cholecalciferol) .Marland Kitchen.. 1 Once Daily By Mouth 2)  Tramadol Hcl 50 Mg  Tabs (Tramadol Hcl) .Marland Kitchen.. 1-2 By Mouth Two Times A Day As Needed Pain 3)  Triazolam 0.25 Mg Tabs (Triazolam) .Marland Kitchen.. 1 or 2 Tabs By Mouth At Bedtime As Needed Insomnia 4)  Cetirizine Hcl 10 Mg Tabs (Cetirizine Hcl) .Marland Kitchen.. 1 By Mouth Once Daily As Needed Allergies  Current Medications (verified): 1)  Vitamin D3 1000 Unit  Tabs (Cholecalciferol) .Marland Kitchen.. 1 Once Daily By Mouth 2)  Tramadol Hcl 50 Mg  Tabs (Tramadol Hcl) .Marland Kitchen.. 1-2 By Mouth Two Times A Day As Needed Pain 3)  Triazolam 0.25 Mg Tabs (Triazolam) .Marland Kitchen.. 1 or 2 Tabs By Mouth At Bedtime As Needed Insomnia 4)  Cetirizine Hcl 10 Mg  Tabs (Cetirizine Hcl) .Marland Kitchen.. 1 By Mouth Once Daily As Needed Allergies 5)  Cephalexin 500 Mg Caps (Cephalexin) .Marland Kitchen.. 1po Three Times A Day  Allergies (verified): 1)  Topamax (Topiramate) 2)  Imitrex (Sumatriptan Succinate) 3)  Naprosyn 4)  Trazodone Hcl (Trazodone Hcl) 5)  Phentermine Hcl (Phentermine Hcl)  Past History:  Past Medical History: Last updated: 12/10/2007 Allergic rhinitis Migraine HA Sickle cell trait Insomnia Vit D def 2009  Gyn Dr Su Hilt  Past Surgical History: Last updated: 05/20/2007 none  Social History: Last updated: 12/08/2008 Never Smoked Alcohol use-no Married, 2 kids w/SS  Risk Factors: Smoking Status: never (10/14/2009)  Review of Systems       all otherwise negative per pt -    Physical Exam  General:  alert and overweight-appearing, fatigued,  mild ill .   Head:  normocephalic and atraumatic.   Eyes:  vision grossly intact, pupils equal, and pupils round.   Ears:  bialt tm's mild erythema, nonbulgin, canals clear Nose:  nasal dischargemucosal pallor and mucosal edema.   Mouth:  pharyngeal erythema and fair dentition.   Neck:  supple and cervical lymphadenopathy.   Lungs:  normal respiratory effort and normal breath sounds.   Heart:  normal rate and regular rhythm.   Abdomen:  soft and normal bowel sounds.  with low mid abd tender without guarding or rebound Extremities:  no edema, no erythema    Impression & Recommendations:  Problem # 1:  URI (ICD-465.9)  Her updated medication list for this problem includes:    Cetirizine Hcl 10 Mg Tabs (Cetirizine hcl) .Marland Kitchen... 1 by mouth once daily as needed allergies treat as above, f/u any worsening signs or symptoms  - c/w prob viral   Problem # 2:  FREQUENCY, URINARY (ICD-788.41)  suspicious for UTI - to check urine studies, tx with cephalexin course  Orders: T-Culture, Urine ([id number]) TLB-Udip w/ Micro ([id number]-[id number])  Problem # 3:  DEHYDRATION (ICD-276.51) mild orthostatic today, taking by mouth well  - to push oral fluids  Problem # 4:  FATIGUE (ICD-780.79) most likely related to above;  declines further labs but should consider if not improved in 1-2 days  Complete Medication List: 1)  Vitamin D3 1000 Unit Tabs (Cholecalciferol) .Marland Kitchen.. 1 once daily by mouth 2)  Tramadol Hcl 50 Mg Tabs (Tramadol hcl) .Marland Kitchen.. 1-2 by mouth two times a day as needed pain 3)  Triazolam 0.25 Mg Tabs (Triazolam) .Marland Kitchen.. 1 or 2 tabs by mouth at bedtime as needed insomnia 4)  Cetirizine Hcl 10 Mg Tabs (Cetirizine hcl) .Marland Kitchen.. 1 by mouth once daily as needed allergies 5)  Cephalexin 500 Mg Caps (Cephalexin) .Marland Kitchen.. 1po three times a day  Patient Instructions: 1)  Please take all new medications as prescribed 2)  Continue all previous medications as before this visit  3)  Please go to the Lab in the basement  for your urine tests today  4)  please drink plenty of fluids over the next several days 5)  Please schedule a follow-up appointment as needed. Prescriptions: CEPHALEXIN 500 MG CAPS (CEPHALEXIN) 1po three times a day  #30 x 0   Entered and Authorized by:   Corwin Levins MD   Signed by:   Corwin Levins MD on 02/16/2010   Method used:   Print then Give to Patient   RxID:   [id number]

## 2010-09-14 NOTE — Progress Notes (Signed)
Summary: Rf Triazolam  Phone Note Refill Request Message from:  Fax from Pharmacy  Refills Requested: Medication #1:  TRIAZOLAM 0.25 MG TABS 1 or 2 tabs by mouth at bedtime as needed insomnia   Dosage confirmed as above?Dosage Confirmed   Supply Requested: 60   Last Refilled: 06/27/2010  Method Requested: Telephone to Pharmacy Next Appointment Scheduled: 07-01-11 Initial call taken by: Lanier Prude, [facility]),  August 30, 2010 11:42 AM  Follow-up for Phone Call        ok 3 ref Follow-up by: Tresa Garter MD,  August 31, 2010 7:41 AM    Prescriptions: TRIAZOLAM 0.25 MG TABS (TRIAZOLAM) 1 or 2 tabs by mouth at bedtime as needed insomnia  #60 x 3   Entered by:   Lamar Sprinkles, CMA   Authorized by:   Tresa Garter MD   Signed by:   Lamar Sprinkles, CMA on 08/31/2010   Method used:   Telephoned to ...       [facility] [phone]* (retail)       [address]       [place], [place]  [address]       Ph: [phone]       Fax: [phone]   RxID:   [id number]

## 2010-11-22 ENCOUNTER — Other Ambulatory Visit: Payer: Self-pay

## 2010-11-29 ENCOUNTER — Encounter: Payer: Self-pay | Admitting: Internal Medicine

## 2010-12-14 ENCOUNTER — Other Ambulatory Visit: Payer: Self-pay | Admitting: Internal Medicine

## 2010-12-14 ENCOUNTER — Other Ambulatory Visit (INDEPENDENT_AMBULATORY_CARE_PROVIDER_SITE_OTHER): Payer: Self-pay

## 2010-12-14 DIAGNOSIS — Z0000 Encounter for general adult medical examination without abnormal findings: Secondary | ICD-10-CM

## 2010-12-14 LAB — CBC WITH DIFFERENTIAL/PLATELET
Basophils Absolute: 0 10*3/uL (ref 0.0–0.1)
Eosinophils Relative: 0.9 % (ref 0.0–5.0)
HCT: 36.6 % (ref 36.0–46.0)
Hemoglobin: 12.4 g/dL (ref 12.0–15.0)
Lymphs Abs: 1.9 10*3/uL (ref 0.7–4.0)
MCV: 89.9 fl (ref 78.0–100.0)
Monocytes Absolute: 0.3 10*3/uL (ref 0.1–1.0)
Monocytes Relative: 5.2 % (ref 3.0–12.0)
Neutro Abs: 3.2 10*3/uL (ref 1.4–7.7)
RDW: 14.1 % (ref 11.5–14.6)

## 2010-12-14 LAB — URINALYSIS
Hgb urine dipstick: NEGATIVE
Nitrite: NEGATIVE
Specific Gravity, Urine: 1.015 (ref 1.000–1.030)
Total Protein, Urine: NEGATIVE
pH: 6 (ref 5.0–8.0)

## 2010-12-14 LAB — TSH: TSH: 1.51 u[IU]/mL (ref 0.35–5.50)

## 2010-12-14 LAB — BASIC METABOLIC PANEL
BUN: 13 mg/dL (ref 6–23)
Calcium: 9.3 mg/dL (ref 8.4–10.5)
Creatinine, Ser: 0.7 mg/dL (ref 0.4–1.2)
GFR: 131.35 mL/min (ref >60.00)
Glucose, Bld: 87 mg/dL (ref 70–99)

## 2010-12-14 LAB — HEPATIC FUNCTION PANEL
AST: 15 U/L (ref 0–37)
Alkaline Phosphatase: 42 U/L (ref 39–117)
Bilirubin, Direct: 0.1 mg/dL (ref 0.0–0.3)
Total Bilirubin: 0.7 mg/dL (ref 0.3–1.2)

## 2010-12-14 LAB — LIPID PANEL: Cholesterol: 174 mg/dL (ref 0–200)

## 2010-12-20 ENCOUNTER — Encounter: Payer: Self-pay | Admitting: Internal Medicine

## 2010-12-20 ENCOUNTER — Ambulatory Visit (INDEPENDENT_AMBULATORY_CARE_PROVIDER_SITE_OTHER): Payer: BC Managed Care – PPO | Admitting: Internal Medicine

## 2010-12-20 DIAGNOSIS — R5383 Other fatigue: Secondary | ICD-10-CM

## 2010-12-20 DIAGNOSIS — Z0000 Encounter for general adult medical examination without abnormal findings: Secondary | ICD-10-CM | POA: Insufficient documentation

## 2010-12-20 DIAGNOSIS — E559 Vitamin D deficiency, unspecified: Secondary | ICD-10-CM

## 2010-12-20 DIAGNOSIS — D573 Sickle-cell trait: Secondary | ICD-10-CM

## 2010-12-20 DIAGNOSIS — R5381 Other malaise: Secondary | ICD-10-CM

## 2010-12-20 DIAGNOSIS — G4700 Insomnia, unspecified: Secondary | ICD-10-CM

## 2010-12-20 DIAGNOSIS — G43009 Migraine without aura, not intractable, without status migrainosus: Secondary | ICD-10-CM

## 2010-12-20 DIAGNOSIS — E538 Deficiency of other specified B group vitamins: Secondary | ICD-10-CM

## 2010-12-20 DIAGNOSIS — R635 Abnormal weight gain: Secondary | ICD-10-CM

## 2010-12-20 MED ORDER — TRIAZOLAM 0.25 MG PO TABS: 0.2500 mg | ORAL_TABLET | Freq: Every evening | ORAL | Status: DC | PRN

## 2010-12-20 MED ORDER — TRAMADOL HCL 50 MG PO TABS: 50.0000 mg | ORAL_TABLET | Freq: Two times a day (BID) | ORAL | Status: DC | PRN

## 2010-12-20 NOTE — Progress Notes (Signed)
  Subjective:    Patient ID: Angel [name], female    DOB: 03-05-1976, 35 y.o.   MRN: [id number]  HPI The patient is here for a wellness exam. The patient has been doing well overall without major physical or psychological issues going on lately.  The patient presents for a follow-up of  chronic insomnia, pains, B12 def controlled with medicines.     Review of Systems Wt Readings from Last 3 Encounters:  12/20/10 170 lb (77.111 kg)  05/26/10 176 lb (79.833 kg)  02/16/10 173 lb 4 oz (78.586 kg)       Objective:   Physical Exam  Constitutional: She appears well-developed and well-nourished. No distress.  HENT:  Head: Normocephalic.  Right Ear: External ear normal.  Left Ear: External ear normal.  Nose: Nose normal.  Mouth/Throat: Oropharynx is clear and moist.  Eyes: Conjunctivae are normal. Pupils are equal, round, and reactive to light. Right eye exhibits no discharge. Left eye exhibits no discharge.  Neck: Normal range of motion. Neck supple. No JVD present. No tracheal deviation present. No thyromegaly present.  Cardiovascular: Normal rate, regular rhythm and normal heart sounds.   Pulmonary/Chest: No stridor. No respiratory distress. She has no wheezes.  Abdominal: Soft. Bowel sounds are normal. She exhibits no distension and no mass. There is no tenderness. There is no rebound and no guarding.  Musculoskeletal: She exhibits no edema and no tenderness.  Lymphadenopathy:    She has no cervical adenopathy.  Neurological: She displays normal reflexes. No cranial nerve deficit. She exhibits normal muscle tone. Coordination normal.  Skin: No rash noted. No erythema.  Psychiatric: She has a normal mood and affect. Her behavior is normal. Judgment and thought content normal.          Assessment & Plan:  COMMON MIGRAINE On Rx  SICKLE-CELL TRAIT Doing well  FATIGUE Chronic  WEIGHT GAIN Lost wt on diet  INSOMNIA On Rx   WELL EXAM We discussed age appropriate  health related issues, including available/recomended screening tests and vaccinations. We discussed a need for adhering to healthy diet and exercise. Labs/EKG were reviewed/ordered. All questions were answered.

## 2010-12-20 NOTE — Assessment & Plan Note (Signed)
On Rx 

## 2010-12-20 NOTE — Assessment & Plan Note (Signed)
Chronic. 

## 2010-12-20 NOTE — Assessment & Plan Note (Signed)
Doing well 

## 2010-12-20 NOTE — Assessment & Plan Note (Signed)
Lost wt on diet 

## 2010-12-20 NOTE — Patient Instructions (Signed)
Stretch neck  Yoga  Contour pillow

## 2010-12-21 ENCOUNTER — Encounter: Payer: Self-pay | Admitting: Internal Medicine

## 2010-12-26 NOTE — Assessment & Plan Note (Signed)
[facility]                                 ON-CALL NOTE   NAME:Choplin, Raylynne                       MRN:          [id number]  DATE:02/22/2007                            DOB:          16-Aug-1975    PRIMARY CARE PHYSICIAN:  Dr. Posey Rea   The patient calls in today complaining of congestion and sinus pressure  for the last week. She reports significant face pain for the last 2  days. The patient reports that she is currently at [facility]  with her child, who has been admitted to the pediatric service. The  patient reports that she has tried over-the-counter medication with no  significant improvement. I advised the patient to go ahead and go to the  [facility] to be assessed as she is nearby. The patient  expressed understanding and will head there in a few minutes.     Leanne Chang, M.D.  Electronically Signed    LA/MedQ  DD: 02/22/2007  DT: 02/24/2007  Job #: [id number]

## 2010-12-29 NOTE — Assessment & Plan Note (Signed)
[facility]                           PRIMARY CARE OFFICE NOTE   NAME:Angel [name]                     MRN:          [id number]  DATE:10/07/2006                            DOB:          07-Feb-1976    The patient is a 35 year old female who presents for a wellness  examination.   Past medical history, family history and social history as per September 25, 2000 note. 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 Married, does not smoke or drink.   CURRENT MEDICATIONS:  Zyrtec 10 mg a day, p.r.n. Darvocet was not  working for her back pain and thigh pain.   FAMILY HISTORY:  Both of her children have sickle cell and are monitored  at [facility].   REVIEW OF SYSTEMS:  Pain in both thighs and back usually at the end of  the day, quite severe at times. Worse with range of motion. Difficulty  sleeping. Occasionally the pain is very severe, Darvocet was not  helping. The rest of the 18 point review of systems is negative  including the absence of urinary symptoms.   PHYSICAL EXAMINATION:  VITAL SIGNS: Blood pressure 100/68, pulse 60,  temperature 98.7. Weight 182 pounds.  GENERAL APPEARANCE: She is in no acute distress, looks well.  HEENT: Moist mucosa.  NECK: Supple, no thyromegaly or bruits.  LUNGS: Clear to auscultation and percussion. No wheezes or rales.  HEART: S1, S2 no murmur, no gallop.  ABDOMEN: Soft, nontender, no organomegaly, no mass felt.  LOWER EXTREMITIES: Without edema.  NEURO: She is alert and oriented, and cooperative. Denies being  depressed.  LUMBOSACRAL SPINE: Without deformities. Decreased range of motion.  Tender over paraspinal muscles.   LABS:  On October 02, 2006: CBC normal. CMET normal. Cholesterol 165,  TSH normal. Urinalysis normal.   ASSESSMENT AND PLAN:  1. Normal wellness examination. Age/health-related issues discussed.      Healthy lifestyle discussed.  2. Regular GYN care with [facility]. To be reexamined in      12 months.  3. Low back pain and bilateral thigh pain, musculoskeletal; recurrent;      of unknown etiology. Possibly mechanical. Vicoprofen 7.5 mg/200 1-2      t.i.d. p.r.n. #90 with 3 refills. She will use it infrequently.  4. Osteopathic consultation with Dr. Artist Pais. Exercise, full range of      motion, stretching.  5. Insomnia. Ambien 10 mg 1/2-1 at h.s. p.r.n. Advised to have 7-8      hours of sleep per night.   I will see her back in 6 months.     Angel Quint. Plotnikov, MD  Electronically Signed    AVP/MedQ  DD: 10/10/2006  DT: 10/10/2006  Job #: [id number]

## 2010-12-29 NOTE — Assessment & Plan Note (Signed)
[facility]                                 ON-CALL NOTE   NAME:Angel [name], Angel [name]                     MRN:          [id number]  DATE:03/05/2008                            DOB:          Jan 13, 1976    REGULAR DOCTOR:  Georgina Quint. Plotnikov, MD.   TIME:  6:10 p.m.   CHIEF COMPLAINT:  Spider bite.   The patient said that she was folding clothes several hours ago, she saw  a spider land on the floor and she killed that.  She thinks she might  have gotten a bite on her arm, it just feels funny and a little tight.  There is a little bump there, but it is not any bigger than a dime, it  is not very swollen.  She has no itching, she has no shortness of  breath, swollen tongue, or swollen throat.  She wanted to know what to  do.  I advised her that if the bite gets much bigger or she develops  shortness of breath, throat swelling, increased pain, fever or any other  symptoms, to go on to the emergency room and get checked out tonight.  Otherwise, I instructed her to use some cold compresses like ice on it,  to take Benadryl over-the-counter orally if she develops itching, and if  it is still bothersome in the morning, to come into the [facility]  where I can evaluate her.  I also encouraged her to call back if she has  any more questions.     Marne A. Tower, MD  Electronically Signed    MAT/MedQ  DD: 03/05/2008  DT: 03/06/2008  Job #: [id number]
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
## 2010-12-29 NOTE — Op Note (Signed)
NAMETorre, Pikus Lyndsee                          ACCOUNT NO.:  [id number]   MEDICAL RECORD NO.:  [id number]                   PATIENT TYPE:  AMB   LOCATION:  DSC                                  FACILITY:  [facility]   PHYSICIAN:  Feliberto Gottron. Turner Daniels, M.D.                DATE OF BIRTH:  [birth year]   DATE OF PROCEDURE:  09/06/2003  DATE OF DISCHARGE:                                 OPERATIVE REPORT   PREOPERATIVE DIAGNOSES:  Right wrist de Quervain's stenosing tenosynovitis  and ganglion.   POSTOPERATIVE DIAGNOSES:  Right wrist de Quervain's stenosing tenosynovitis  and ganglion.   PROCEDURE:  Right wrist excision of a ganglion that was coming off of the  sheath over the extensor tendons of the first dorsal compartment, followed  by a formal de Quervain's release.   SURGEON:  Feliberto Gottron. Turner Daniels, M.D.   ASSISTANTLaural Benes. Su Hilt, P.A.-C.   ANESTHESIA:  General LMA.   ESTIMATED BLOOD LOSS:  Minimal.   FLUIDS REPLACED:  Ws 500 mL of crystalloid.   DRAINS PLACED:  None.   TOURNIQUET TIME:  Was 25 minutes.   INDICATIONS FOR PROCEDURE:  This 35 year old woman followed for a right  wrist de Quervain's stenosing tenosynovitis, with what appears to be a  ganglion present that is palpable and causing pain.  She has failed  conservative treatment and desires elective removal of the same.   DESCRIPTION OF PROCEDURE:  The patient is identified by arm band and was  taken to the operating room at [facility].  Appropriate anesthetic monitors were attached.  General LMA  anesthesia was induced with the patient in the supine position.  A  tourniquet was applied to the right forearm and the right upper extremity  prepped and draped in the usual sterile fashion from the finger tips to the  tourniquet.  The limb was wrapped with an Esmarch bandage.  The tourniquet  was inflated to 300 mmHg.  We began the procedure by making a 1.5 cm  incision over the first dorsal  compartment where there was the ganglion  palpable.  We immediately identified the ganglion which seemed to be filled  with blood about 0.5 cm in diameter, and this was shelled out and then  shaved off the sheath of the first dorsal compartment.  This was sent to  pathology for evaluation.  We then positively identified the pulley over the  first dorsal compartment and longitudinally incised it with a #15 blade and  took out a rectangular section of  the pulley, exposing the tendons of the  EPB and the APL which were noted to move freely, although there was some  evidence of inflammation as they went under the inflamed pulley.  The wound  was then washed out with normal saline solution and the skin only closed  with a  running #5-0 nylon suture.  A dressing of Xeroform, 4 x 4 dressings,  sponges, Webril and an Ace wrap were applied.  The patient was then awakened and taken to the recovery room without  difficulty.                                               Feliberto Gottron. Turner Daniels, M.D.    Ovid Curd  D:  09/06/2003  T:  09/06/2003  Job:  [id number]

## 2010-12-29 NOTE — Discharge Summary (Signed)
[name], [name]              ACCOUNT NO.:  [id number]   MEDICAL RECORD NO.:  [id number]          PATIENT TYPE:  INP   LOCATION:  9124                          FACILITY:  [facility]   PHYSICIAN:  Janine Limbo, M.D.DATE OF BIRTH:  1976/04/04   DATE OF ADMISSION:  12/26/2005  DATE OF DISCHARGE:  12/28/2005                                 DISCHARGE SUMMARY   ADMITTING DIAGNOSES:  1.  Intrauterine pregnancy at term.  2.  Early labor.  3.  The patient with sickle cell trait, father of baby with sickle trait,      and history of son with sickle cell disease.  4.  Desires tubal sterilization.   DISCHARGE DIAGNOSES:  1.  Intrauterine pregnancy at term.  2.  The patient with history of sickle cell trait and previous child with      sickle cell disease.  3.  Desires tubal sterilization.   PROCEDURES:  1.  Normal spontaneous vaginal birth.  2.  Repair first-degree perineal laceration.  3.  Bilateral tubal sterilization.  4.  Epidural anesthesia.   HOSPITAL COURSE:  Angel [name] is a 35 year old gravida 2, para 1-0-0-1 at  23 and 6/7 weeks who presented with uterine contractions in early labor  early morning of Dec 26, 2005.  Her pregnancy had been remarkable for:  #1.  History of sickle cell trait with the father of the baby with sickle  trait and the patient having a son with sickle cell disease.  She had  declined amino for sickle cell determination.  #2.  History of cryosurgery.  #3.  Desires tubal sterilization.  The patient was monitored in maternity admissions unit.  Her cervix was then  3-4 cm, and she was advancing in her contractions.  She also desired cord  blood collection via [employer].  Artificial rupture of membranes was  accomplished at 6:40 in the morning with clear fluid noted.  At that time,  she was 4 cm.  She received some Stadol with good relief.  Epidural was  placed as labor progressed.  She was placed on Pitocin for labor support as  her contractions spaced  out, approximately 9 a.m.  At 12:30, she was 4-5 cm,  90% vertex anterior.  She was complete at 1:30 p.m. with vertex at a +2  station, pushing was begun secondary to sensation of needing to push.  She  then delivered at 1:52 p.m. a viable female by the name of Angel [name].  This was a spontaneous vaginal delivery.  Apgar's were 9 and 9, weight was 6  pounds 12 ounces.  Cord blood was collected for private donation.  There was  a first-degree perineal laceration noted that was repaired in the usual  fashion.  The patient did continue to desire tubal sterilization.  She was  made n.p.o. after midnight and this was performed of postpartum day 1.  This  was performed by Dr. Osborn Coho.  The patient's hemoglobin on day 1  postpartum was 11.8, white blood cell count was 9.7, and platelets were 197.  The patient tolerated the tubal sterilization without difficulty and  was  taken to recovery in good condition.  By postpartum day 2 and postop day 1,  the patient was doing well.  She was up at ad lib, she was tolerating a  regular diet.  Her tubal incision was clean, dry, and intact.  She was  having minimal bleeding.  She was bottle feeding.  She was deemed to have  received the full benefit of her hospital stay and was discharged home.   DISCHARGE INSTRUCTIONS:  Per [facility] handout.   DISCHARGE MEDICATIONS:  1.  Motrin 600 mg p.o. q.6 h. p.r.n. pain.  2.  Tylox 1-2 q.3-4 h. p.r.n. pain.   FOLLOW UP:  Discharge followup will occur in 6 weeks at [facility]  or p.r.n.      Angel [name], C.N.M.      Janine Limbo, M.D.  Electronically Signed    VLL/MEDQ  D:  12/28/2005  T:  12/28/2005  Job:  [id number]

## 2010-12-29 NOTE — Op Note (Signed)
Angel [name], Angel [name]              ACCOUNT NO.:  [id number]   MEDICAL RECORD NO.:  [id number]          PATIENT TYPE:  INP   LOCATION:  9124                          FACILITY:  [facility]   PHYSICIAN:  Osborn Coho, M.D.   DATE OF BIRTH:  07/12/1976   DATE OF PROCEDURE:  12/27/2005  DATE OF DISCHARGE:                                 OPERATIVE REPORT   PREOPERATIVE DIAGNOSIS:  1.  Status post normal spontaneous vaginal delivery.  2.  Desires permanent sterilization.   POSTOPERATIVE DIAGNOSIS:  1.  Status post normal spontaneous vaginal delivery.  2.  Desires permanent sterilization.   PROCEDURE:  Postpartum bilateral tubal ligation.   ANESTHESIA:  Epidural.   SURGEON:  Osborn Coho, M.D.   FLUIDS:  600 mL.   URINE OUTPUT:  Voided prior to procedure.   ESTIMATED BLOOD LOSS:  Minimal, less than 10 mL   COMPLICATIONS:  None.   FINDINGS:  Normal appearing bilateral fallopian tubes.   PROCEDURE:  The patient was taken to the operating room after the risks,  benefits, and alternatives were reviewed with the patient.  The patient  verbalized understanding and consent signed and witnessed.  The patient was  given a surgical level of the epidural and prepped and draped in a normal  sterile fashion in the dorsal supine position.  An approximately 10 mm  incision was made at the umbilicus and carried down to the underlying layer  of fascia via sharp and blunt dissection.  The fascia was incised with the  scalpel and the peritoneum entered bluntly.  The right fallopian tube was  identified and carried out to its fimbriated end.  The tube was grasped in  its mid portion and ligated twice with 0 plain ties.  The tube was excised.  The remaining stump was cauterized with the Bovie.  The portion of the tube  was sent to pathology.  The same was done on the left fallopian tube after  carrying it out to its fimbriated end.  All instruments were removed and the  fascia was repaired with 0  Vicryl in a running fashion.  The  subcutaneous tissue was also reapproximated using 0 Vicryl.  The skin was  reapproximated using 3-0 Monocryl in a subcuticular stitch.  Sponge, lap and  needle count was correct.  The patient tolerated the procedure well and was  returned to the recovery room in good condition.      Osborn Coho, M.D.  Electronically Signed     AR/MEDQ  D:  12/27/2005  T:  12/27/2005  Job:  [id number]

## 2010-12-29 NOTE — Op Note (Signed)
[place]  Patient:    Angel [name]                          MRN: [id number] Proc. Date: 09/04/99 Adm. Date:  [id number] Attending:  Alinda Deem                           Operative Report  PREOPERATIVE DIAGNOSIS:  Left dorsal wrist ganglion.  POSTOPERATIVE DIAGNOSIS:  Left dorsal wrist ganglion.  OPERATION:  Excision of left dorsal wrist ganglion.  SURGEON:  Alinda Deem, M.D.  ASSISTANT:  None.  ANESTHESIA:  Local with IV sedation.  ESTIMATED BLOOD LOSS:  Minimal.  FLUID REPLACEMENT:  800 cc of crystalloid.  DRAINS:  None.  TOURNIQUET TIME:  12 minutes.  INDICATION:  A 35 year old woman with a six-month history of a left dorsal wrist ganglion that has gotten slowing bigger over time, although it does wax and wane some in size.  There is pain with dorsiflexion or palmar flexion and she desires elective removal of the mass.  She declined aspiration and cortisone injection.  Said that her mom had the same procedure and it needed surgery.  She wants the same.  This is reasonable.  Risks and benefits of the surgery are discussed with the patient.  DESCRIPTION OF PROCEDURE:  The patient identified by arm band.  Taken to the operating room at [facility].  Appropriate anesthetic monitors were attached and IV regional anesthesia with IV sedation was induced.  Left hand was then prepped and draped in the usual sterile fashion from the fingertips to a tourniquet at the midforearm.  We began the procedure by making a 2 cm longitudinal insertion centered over the dorsal mass which is near the scapholunate joint.  Small bleeders and subcutaneous tissue were identified and cauterized.  The extensor tendons were taken medially and laterally exposing the ganglion which was then excised with tenotomy scissors and traced back to the scapholunate joint.  Here the tissue was cauterized and sutured closed with 4-0  Vicryl suture, one zip one mattress-type stitch.  The wound was then washed out with normal saline solution and the skin only closed with running 5-0 nylon suture.  Dressing of xerofoam, 4 by 4 dressing, sponges,  Webril, and a two inch Ace wrap applied, followed by a Velcro splint.  The patient tolerated the procedure well.  She was then taken to the recovery room without difficulty. DD:  09/04/99 TD:  09/05/99 Job: [id number]

## 2010-12-29 NOTE — H&P (Signed)
NAMEKALICIA, [name]              ACCOUNT NO.:  [id number]   MEDICAL RECORD NO.:  [id number]          PATIENT TYPE:  INP   LOCATION:  9166                          FACILITY:  [facility]   PHYSICIAN:  Osborn Coho, M.D.   DATE OF BIRTH:  02/27/1976   DATE OF ADMISSION:  12/26/2005  DATE OF DISCHARGE:                                HISTORY & PHYSICAL   HISTORY OF PRESENT ILLNESS:  [name] is a 35 year old gravida 2, para 1-  0-0-1, at 37-6/7 weeks, who presented with uterine contractions of gradually  increasing frequency; initially on admission, they were 6-7 minutes.  She  denied leaking or bleeding and reported positive fetal movement.  Cervix had  been 3 cm in the office on Dec 25, 2005.  Pregnancy has been remarkable for:  #1 - History of sickle cell trait with father of the baby with sickle cell  trait, #2 - son with sickle cell disease, #3 - history of cryo, #4 - patient  declined amniocentesis for sickle cell determination of this baby.   PRENATAL LABORATORY DATA:  Blood type is A-positive, Rh-antibody negative.  Sickle cell trait is positive with positive hemoglobin electrophoresis.  RPR  is nonreactive.  Rubella titer positive.  Hepatitis B surface antigen  negative.  Hemoglobin upon entry into practice was 11.9; it was 10.3 at 26  weeks.  Glucola was normal.  RPR was nonreactive.  Group B strep culture was  negative at 36 weeks.  EDC of Jan 08, 2006 was established by last menstrual  period and is in agreement with ultrasound at approximately 7 and 18 weeks.  GC and Chlamydia cultures were negative in the first trimester.  Cystic  fibrosis and HIV were declined.  Quadruple screen was declined.   HISTORY OF PRESENT PREGNANCY:  The patient entered care at approximately 7  weeks.  She declined amniocentesis, despite the history of patient having  sickle cell trait and father of the baby with sickle cell trait and having a  son with sickle cell disease.  She declined quadruple  screen.  She had an  ultrasound at 17-18 weeks showing normal growth and development, no previa,  normal anatomy.  She had some right-sided abdominal pain at 20 weeks.  Her  hemoglobin was 10.3 at 26 weeks.  She was given samples of iron to take.  Her Glucola was normal.  She was considering tubal and did meet with Dr.  Su Hilt during her pregnancy; she did elect to desire tubal.   OBSTETRICAL HISTORY:  In 2003, she had a vaginal birth of a female infant,  weight 5 pounds 14 ounces, at 36 weeks.  She was in labor 26 hours.  She had  epidural anesthesia.  She had no complications.  That child does have sickle  cell anemia.   MEDICAL HISTORY:  1.  She had an IUD for about 3 years.  2.  She had an abnormal Pap over 10 years ago.  3.  She had bacterial vaginosis in 2003.  4.  She had occasional yeast infections.  5.  She reports the usual childhood illnesses.  6.  She does have sickle cell trait.  7.  She had 2 cysts removed off her hands in the past.   ALLERGIES:  None.   FAMILY HISTORY:  Paternal grandfather had heart disease.  Her father and  both sets of grandparents had hypertension.  Her son has sickle cell anemia.  Her paternal grandmother has Alzheimer's; maternal grandmother had throat  cancer and was treated.  Paternal grandfather had prostate cancer.  Maternal  first cousin had breast cancer at age 22.   GENETIC HISTORY:  Genetic history is remarkable for her uncle born with a  heart murmur, father has 2 cousins with mental retardation and the  previously noted father of the baby and the patient having sickle cell trait  and son having disease.  Father of the baby's nephew also has sickle cell  disease.   SOCIAL HISTORY:  The patient is married to the father of the baby; he is  involved and supportive; his name is Tamsen Snider.  The patient is college-  educated; she is employed as a Sports coach.  Her husband has 12 years of  education and he is employed as a Magazine features editor.  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  She has been followed by the certified nurse midwife  service at [facility].  She denies any alcohol, drug or tobacco use  during this pregnancy.   PHYSICAL EXAMINATION:  VITAL SIGNS:  Stable.  The patient is afebrile.  HEENT:  Within normal limits.  LUNGS:  Bilateral breath sounds are clear.  HEART:  Regular rate and rhythm without murmur.  BREASTS:  Soft and nontender.  ABDOMEN:  Fundal height is approximately 39 cm.  Estimated fetal weight is 7  to 7-1/2 pounds.  Uterine contractions are every 2-5 minutes, of moderate  quality.  Fetal monitoring shows a reactive fetal heart rate tracing with no  decelerations and uterine contractions now every 2-5 minutes.  PELVIC:  Cervical exam initially was 3 cm, posterior, 60%, vertex at a -1  station.  After walking for an hour, cervix was now 3-4 cm, 70%, vertex at a  -1 station.  EXTREMITIES:  Deep tendon reflexes are 2+ without clonus.  There is trace  edema noted.   IMPRESSION:  1.  Intrauterine pregnancy at 37-6/7 weeks.  2.  Early labor.  3.  Patient with sickle cell trait and father of the baby with sickle cell      trait, but declined amniocentesis.  4.  Desires tubal sterilization.  5.  Negative group B Streptococcus.   PLAN:  1.  Admit to birthing suite per consult with Dr. Osborn Coho as attending      physician.  2.  Routine certified nurse midwife orders.  3.  Plan IV pain medications or epidural as the patient desires.  4.  If no advancement of cervix over the next 2 hours, we will plan      artificial rupture of membranes.      Renaldo Reel Emilee Hero, C.N.M.      Osborn Coho, M.D.  Electronically Signed    VLL/MEDQ  D:  12/26/2005  T:  12/26/2005  Job:  [id number]

## 2011-05-16 ENCOUNTER — Other Ambulatory Visit: Payer: Self-pay | Admitting: Obstetrics and Gynecology

## 2011-05-16 DIAGNOSIS — N63 Unspecified lump in unspecified breast: Secondary | ICD-10-CM

## 2011-05-28 ENCOUNTER — Ambulatory Visit
Admission: RE | Admit: 2011-05-28 | Discharge: 2011-05-28 | Disposition: A | Discharge status: Home / Self Care | Payer: BC Managed Care – PPO | Source: Ambulatory Visit | Attending: Obstetrics and Gynecology | Admitting: Obstetrics and Gynecology

## 2011-05-28 DIAGNOSIS — N63 Unspecified lump in unspecified breast: Secondary | ICD-10-CM

## 2011-05-31 ENCOUNTER — Ambulatory Visit: Payer: BC Managed Care – PPO

## 2011-06-05 ENCOUNTER — Encounter: Payer: Self-pay | Admitting: *Deleted

## 2011-06-05 ENCOUNTER — Ambulatory Visit (INDEPENDENT_AMBULATORY_CARE_PROVIDER_SITE_OTHER): Payer: BC Managed Care – PPO

## 2011-06-05 DIAGNOSIS — Z23 Encounter for immunization: Secondary | ICD-10-CM

## 2011-06-18 ENCOUNTER — Ambulatory Visit (INDEPENDENT_AMBULATORY_CARE_PROVIDER_SITE_OTHER): Payer: BC Managed Care – PPO | Admitting: Internal Medicine

## 2011-06-18 ENCOUNTER — Encounter: Payer: Self-pay | Admitting: Internal Medicine

## 2011-06-18 ENCOUNTER — Other Ambulatory Visit (INDEPENDENT_AMBULATORY_CARE_PROVIDER_SITE_OTHER): Payer: BC Managed Care – PPO

## 2011-06-18 VITALS — BP 110/68 | HR 80 | Temp 98.4°F | Resp 16 | Wt 174.0 lb

## 2011-06-18 DIAGNOSIS — R5383 Other fatigue: Secondary | ICD-10-CM

## 2011-06-18 DIAGNOSIS — G4700 Insomnia, unspecified: Secondary | ICD-10-CM

## 2011-06-18 DIAGNOSIS — F4323 Adjustment disorder with mixed anxiety and depressed mood: Secondary | ICD-10-CM

## 2011-06-18 DIAGNOSIS — R5381 Other malaise: Secondary | ICD-10-CM

## 2011-06-18 DIAGNOSIS — F419 Anxiety disorder, unspecified: Secondary | ICD-10-CM | POA: Insufficient documentation

## 2011-06-18 DIAGNOSIS — N920 Excessive and frequent menstruation with regular cycle: Secondary | ICD-10-CM

## 2011-06-18 LAB — BASIC METABOLIC PANEL
BUN: 15 mg/dL (ref 6–23)
Calcium: 8.9 mg/dL (ref 8.4–10.5)
Creatinine, Ser: 0.8 mg/dL (ref 0.4–1.2)
GFR: 100.53 mL/min (ref >60.00)
Glucose, Bld: 91 mg/dL (ref 70–99)
Potassium: 3.9 mEq/L (ref 3.5–5.1)

## 2011-06-18 LAB — CBC WITH DIFFERENTIAL/PLATELET
Basophils Absolute: 0 10*3/uL (ref 0.0–0.1)
Basophils Relative: 0.2 % (ref 0.0–3.0)
Eosinophils Absolute: 0.1 10*3/uL (ref 0.0–0.7)
Eosinophils Relative: 1.5 % (ref 0.0–5.0)
HCT: 35.4 % — ABNORMAL LOW (ref 36.0–46.0)
Hemoglobin: 11.8 g/dL — ABNORMAL LOW (ref 12.0–15.0)
Lymphocytes Relative: 30.3 % (ref 12.0–46.0)
Lymphs Abs: 1.5 10*3/uL (ref 0.7–4.0)
MCHC: 33.3 g/dL (ref 30.0–36.0)
MCV: 90.6 fl (ref 78.0–100.0)
Monocytes Absolute: 0.4 10*3/uL (ref 0.1–1.0)
Monocytes Relative: 7.3 % (ref 3.0–12.0)
Neutro Abs: 3 10*3/uL (ref 1.4–7.7)
Neutrophils Relative %: 60.7 % (ref 43.0–77.0)
Platelets: 256 10*3/uL (ref 150.0–400.0)
RBC: 3.91 Mil/uL (ref 3.87–5.11)
RDW: 14 % (ref 11.5–14.6)
WBC: 5 10*3/uL (ref 4.5–10.5)

## 2011-06-18 MED ORDER — CLONAZEPAM 0.25 MG PO TBDP: 0.2500 mg | ORAL_TABLET | Freq: Two times a day (BID) | ORAL | Status: AC | PRN

## 2011-06-18 NOTE — Assessment & Plan Note (Signed)
Klonopin wafer prn

## 2011-06-18 NOTE — Progress Notes (Signed)
  Subjective:    Patient ID: Angel [name], female    DOB: August 03, 1976, 35 y.o.   MRN: [id number]  HPI  C/o lightheadedness and dizziness  X 1-2 d off and on BP was 94/67. She got better after eating a sandwich. She is stressed out about her 35 yo dtr's upcoming splenectomy on Fri. C/o heavier periods   Review of Systems  Constitutional: Negative for chills, activity change, appetite change, fatigue and unexpected weight change.  HENT: Negative for congestion, mouth sores and sinus pressure.   Eyes: Negative for visual disturbance.  Respiratory: Negative for cough and chest tightness.   Gastrointestinal: Negative for nausea and abdominal pain.  Genitourinary: Negative for frequency, difficulty urinating and vaginal pain.  Musculoskeletal: Negative for back pain and gait problem.  Skin: Negative for pallor and rash.  Neurological: Negative for dizziness, tremors, weakness, numbness and headaches.  Psychiatric/Behavioral: Negative for confusion and sleep disturbance. The patient is nervous/anxious (dtr is to have splenectomy).        Objective:   Physical Exam  Constitutional: She appears well-developed. No distress.       Obese  HENT:  Head: Normocephalic.  Right Ear: External ear normal.  Left Ear: External ear normal.  Nose: Nose normal.  Mouth/Throat: Oropharynx is clear and moist.  Eyes: Conjunctivae are normal. Pupils are equal, round, and reactive to light. Right eye exhibits no discharge. Left eye exhibits no discharge.  Neck: Normal range of motion. Neck supple. No JVD present. No tracheal deviation present. No thyromegaly present.  Cardiovascular: Normal rate, regular rhythm and normal heart sounds.   Pulmonary/Chest: No stridor. No respiratory distress. She has no wheezes.  Abdominal: Soft. Bowel sounds are normal. She exhibits no distension and no mass. There is no tenderness. There is no rebound and no guarding.  Musculoskeletal: She exhibits no edema and no  tenderness.  Lymphadenopathy:    She has no cervical adenopathy.  Neurological: She displays normal reflexes. No cranial nerve deficit. She exhibits normal muscle tone. Coordination normal.  Skin: No rash noted. No erythema.  Psychiatric: She has a normal mood and affect. Her behavior is normal. Judgment and thought content normal.   BP 115/75 supine and standing       Assessment & Plan:

## 2011-06-18 NOTE — Assessment & Plan Note (Signed)
Likely stress related Check labs

## 2011-06-18 NOTE — Assessment & Plan Note (Signed)
Ibuprofen prn Check labs

## 2011-06-18 NOTE — Assessment & Plan Note (Signed)
Continue with current prescription therapy as reflected on the Med list.  

## 2011-06-19 ENCOUNTER — Telehealth: Payer: Self-pay | Admitting: Internal Medicine

## 2011-06-19 NOTE — Telephone Encounter (Signed)
Angel [name], please, inform patient that all labs are normal except for a very mild anemia Thx

## 2011-06-19 NOTE — Telephone Encounter (Signed)
Pt informed

## 2011-06-23 ENCOUNTER — Encounter: Payer: Self-pay | Admitting: Internal Medicine

## 2011-08-30 ENCOUNTER — Telehealth: Payer: Self-pay | Admitting: *Deleted

## 2011-08-30 DIAGNOSIS — R040 Epistaxis: Secondary | ICD-10-CM

## 2011-08-30 NOTE — Telephone Encounter (Signed)
Called pt to see if she already has a ENT doctor. No answer/ Left mess for patient to call back.

## 2011-08-30 NOTE — Telephone Encounter (Signed)
ENT Referral entered.

## 2011-08-30 NOTE — Telephone Encounter (Signed)
Pt aware of below.

## 2011-08-30 NOTE — Telephone Encounter (Signed)
Left vm c/o frequent nose bleeds. She states it started Sunday while in church and lasted for approx 10 min. She had a warm (hot flash) when it happened. Everyday since then the right side bleeds for a few minutes then she is able to get it to stop. I advised her to not hold her head back but to place pressure on the nostril that is bleeding. Does she need any further evaluation/advisement?

## 2011-08-30 NOTE — Telephone Encounter (Signed)
Pls call ENT - they should work her in and fix it Thx

## 2011-09-11 ENCOUNTER — Telehealth: Payer: Self-pay | Admitting: *Deleted

## 2011-09-11 NOTE — Telephone Encounter (Signed)
Patient called and left voice message stating she has had a headache for the past 2 days., and she is requesting a return phone call.  Call returned to patient and she states she has had a bad headache since yesterday. She c/o blurred vision. She states she has tried ibuprofen and tylenol, and denies light and sound sensitivity. She is requesting evaluation.

## 2011-09-11 NOTE — Telephone Encounter (Signed)
Try Exedrin 2 tabs prn OV w/any MD I could see her on Thur- 1:30 pm or so Thx

## 2011-09-12 NOTE — Telephone Encounter (Signed)
Call placed to patient at [phone], she was informed per Dr Rodena Medin instructions, and has verbalized understanding. She was offered appointment and states she has an appointment scheduled for Monday already that she will keep.

## 2011-09-17 ENCOUNTER — Ambulatory Visit (INDEPENDENT_AMBULATORY_CARE_PROVIDER_SITE_OTHER): Payer: BC Managed Care – PPO | Admitting: Internal Medicine

## 2011-09-17 ENCOUNTER — Encounter: Payer: Self-pay | Admitting: Internal Medicine

## 2011-09-17 VITALS — BP 100/60 | HR 80 | Temp 99.3°F | Resp 16 | Wt 180.0 lb

## 2011-09-17 DIAGNOSIS — M79672 Pain in left foot: Secondary | ICD-10-CM

## 2011-09-17 DIAGNOSIS — M79609 Pain in unspecified limb: Secondary | ICD-10-CM

## 2011-09-17 DIAGNOSIS — J069 Acute upper respiratory infection, unspecified: Secondary | ICD-10-CM

## 2011-09-17 MED ORDER — PROMETHAZINE-CODEINE 6.25-10 MG/5ML PO SYRP: 5.0000 mL | ORAL_SOLUTION | ORAL | Status: AC | PRN

## 2011-09-17 MED ORDER — AZITHROMYCIN 250 MG PO TABS: ORAL_TABLET | ORAL | Status: AC

## 2011-09-17 NOTE — Patient Instructions (Signed)
Use over-the-counter  "cold" medicines  such as "Tylenol cold" , "Advil cold",  "Mucinex" or" Mucinex D"  for cough and congestion.   Avoid decongestants if you have high blood pressure and use "Afrin" nasal spray for nasal congestion as directed instead. Use" Delsym" or" Robitussin" cough syrup varietis for cough.  You can use plain "Tylenol" or "Advil" for fever, chills and achyness.  Please, make an appointment if you are not better or if you're worse.  

## 2011-09-17 NOTE — Assessment & Plan Note (Addendum)
Zpac Prom-cod 

## 2011-09-19 ENCOUNTER — Encounter: Payer: Self-pay | Admitting: Internal Medicine

## 2011-09-19 DIAGNOSIS — M79672 Pain in left foot: Secondary | ICD-10-CM | POA: Insufficient documentation

## 2011-09-19 NOTE — Assessment & Plan Note (Signed)
NSAID trial Podiatry consult

## 2011-09-19 NOTE — Progress Notes (Signed)
  Subjective:    Patient ID: Angel [name], female    DOB: 1976-02-03, 36 y.o.   MRN: [id number]  HPI C/o fatigue and URI sx's x 3 d - feeling bad. OTC meds did not help. Getting worse C/o L big toe hurts lately w/walking   .Review of Systems  Constitutional: Positive for fever, chills and fatigue.  HENT: Positive for ear pain, congestion, rhinorrhea, postnasal drip and sinus pressure.   Respiratory: Positive for cough.   Musculoskeletal: Positive for arthralgias and gait problem.       Objective:   Physical Exam  Constitutional: She appears well-developed. No distress.  HENT:  Head: Normocephalic.  Right Ear: External ear normal.  Left Ear: External ear normal.  Nose: Nose normal.       eryth throat   Eyes: Conjunctivae are normal. Pupils are equal, round, and reactive to light. Right eye exhibits no discharge. Left eye exhibits no discharge.  Neck: Normal range of motion. Neck supple. No JVD present. No tracheal deviation present. No thyromegaly present.  Cardiovascular: Normal rate, regular rhythm and normal heart sounds.   Pulmonary/Chest: No stridor. No respiratory distress. She has no wheezes.  Abdominal: Soft. Bowel sounds are normal. She exhibits no distension and no mass. There is no tenderness. There is no rebound and no guarding.  Musculoskeletal: She exhibits tenderness (L 1st MTP is tender, clicking). She exhibits no edema.  Lymphadenopathy:    She has no cervical adenopathy.  Neurological: She displays normal reflexes. No cranial nerve deficit. She exhibits normal muscle tone. Coordination normal.  Skin: No rash noted. No erythema.  Psychiatric: She has a normal mood and affect. Her behavior is normal. Judgment and thought content normal.          Assessment & Plan:

## 2011-11-23 ENCOUNTER — Telehealth: Payer: Self-pay | Admitting: *Deleted

## 2011-11-23 NOTE — Telephone Encounter (Signed)
Rf req for Triazolam 0.25 mg 1-2 po qhs prn. Ok to Rf?

## 2011-11-24 NOTE — Telephone Encounter (Signed)
OK to fill this prescription with additional refills x3 Thank you!  

## 2011-11-26 MED ORDER — TRIAZOLAM 0.25 MG PO TABS: 0.2500 mg | ORAL_TABLET | Freq: Every evening | ORAL | Status: DC | PRN

## 2011-11-26 NOTE — Telephone Encounter (Signed)
Done

## 2011-12-31 ENCOUNTER — Telehealth: Payer: Self-pay

## 2011-12-31 NOTE — Telephone Encounter (Signed)
[facility] Triage Call Report Triage Record Num: [id number] Operator: Judeen Hammans Patient Name: Angel [name] Call Date & Time: 12/28/2011 9:00:23PM Patient Phone: PCP: Sonda Primes Patient Gender: Female PCP Fax : [phone] Patient DOB: 1976/04/24 [facility] Name: [facility] Reason for Call: Caller: Synthia/Patient; PCP: Sonda Primes; CB#: [phone]; Call regarding Bad Headache; Onset 12/28/11. Afebrile. Pt took 2 Aleve this morning with no relief- was able to work through the day. Head was pounding with dizziness and lightheadednss; throbbing behind left eye and nose bleed (Pt has had a hx of nose bleeds). Pt states vision was a bit blurry as well. Took 4 Ibuprofen this evening with no relief. States she has never had a headache this severe before - has new onset of tenderness over her temple area- particularly the left side. All emergent sxs r/o per Headache Guideline except for "New tenderness over temporal area." Advised pt to be seen by MD within 4 hours per care advice - most likely will have to be the ED. Pt agrees to plan of care. Protocol(s) Used: Headache Recommended Outcome per Protocol: See Provider within 4 hours Reason for Outcome: New tenderness over temporal area Care Advice: ~ Another adult should drive. ~ Do not give the patient anything to eat or drink. ~ Do not take aspirin for headache until discussing with your provider. Call EMS 911 if any of the following develop: sudden changes in vision including blindness or double vision; new confusion or disorientation; any difficulty speaking, difficulty walking or using an arm or leg, severe numbness on one side of the face, body or any extremities. ~ ~ IMMEDIATE ACTION 12/28/2011 9:14:23PM Page 1 of 1 [id number]

## 2012-01-16 ENCOUNTER — Telehealth: Payer: Self-pay | Admitting: Obstetrics and Gynecology

## 2012-01-16 NOTE — Telephone Encounter (Signed)
TC to pt.  States is having increased vag D/C since 01/15/12.  Slight odor.   No itching. Sched for eval with EP 01/17/12. Marland Kitchen

## 2012-01-16 NOTE — Telephone Encounter (Signed)
Triage/cht received/pt last seen on 03/2011

## 2012-01-17 ENCOUNTER — Encounter: Payer: Self-pay | Admitting: Obstetrics and Gynecology

## 2012-01-17 ENCOUNTER — Ambulatory Visit (INDEPENDENT_AMBULATORY_CARE_PROVIDER_SITE_OTHER): Payer: BC Managed Care – PPO | Admitting: Obstetrics and Gynecology

## 2012-01-17 VITALS — BP 100/72 | HR 74 | Ht 66.0 in | Wt 183.0 lb

## 2012-01-17 DIAGNOSIS — N898 Other specified noninflammatory disorders of vagina: Secondary | ICD-10-CM

## 2012-01-17 DIAGNOSIS — G43909 Migraine, unspecified, not intractable, without status migrainosus: Secondary | ICD-10-CM

## 2012-01-17 DIAGNOSIS — D573 Sickle-cell trait: Secondary | ICD-10-CM

## 2012-01-17 DIAGNOSIS — G4700 Insomnia, unspecified: Secondary | ICD-10-CM

## 2012-01-17 LAB — POCT WET PREP (WET MOUNT): Whiff Test: NEGATIVE

## 2012-01-17 MED ORDER — AMBULATORY NON FORMULARY MEDICATION: 1.0000 | Status: DC

## 2012-01-17 NOTE — Progress Notes (Signed)
Odor: yes Fever: no Pelvic Pain: no  Itching: no Dyspareunia: no Desires GC/CT: no  Thin: yes History of PID: no Desires HIV,RPR,HbsAG: no  Thick: no History of STD: no Other: WHITE IN COLOR

## 2012-01-17 NOTE — Progress Notes (Signed)
35 YO complains of vaginal odor x 3 days. Denies itching or burning.   O: Pelvic: EGBUS-wnl, vagina-moderate beige discharge, cervix-no lesions, uteurs-normal size, adnexae-no masses or tenderness  Wet Prep:  pH 5.0  ; whiff-negative;   no clue, trich or yeast  A:  Shift in vaginal pH  P: Boric Acid Capsules 600 mg # 30 1 pv twice weekly x 8 weeks then prn  RTO-as scheduled   Angel Oconnor, PA-C

## 2012-01-17 NOTE — Patient Instructions (Signed)
Avoid: - excess soap on genital area (consider using plain oatmeal soap) - use of powder or sprays in genital area - douching - wearing underwear to bed (except with menses) - using more than is directed detergent when washing clothes - tight fitting garments around genital area - excess sugar intake  Use Boric Acid Capsules as directed twice weekly for 8 weeks then as needed.  [facility] pharmacies that carry Boric Acid Capsules/Suppositories:  [facility] [address] (only)  [phone]  [facility]  [address] [facility])  [phone]  [facility])  [address] [phone]  [facility]  [address] [phone]

## 2012-01-23 ENCOUNTER — Telehealth: Payer: Self-pay | Admitting: Obstetrics and Gynecology

## 2012-01-23 NOTE — Telephone Encounter (Signed)
Triage/epic 

## 2012-01-23 NOTE — Telephone Encounter (Signed)
Patient seen recently with complaints of vaginal odor and findings of an alkaline vaginal discharge complains of continued symptoms in spite of using Boric Acid Capsules prescribed to correct the vaginal pH.  Patient may have Tinidazole 500 mg  # 14 bid x 7 day with no refills to manage persistent BV symptoms. Effie Janoski,PA-C

## 2012-01-23 NOTE — Telephone Encounter (Signed)
Pt had appointment 01/17/12 and states that the Boric Acid does not seem to be helping. Can something different be prescribed or does pt need another appointment?

## 2012-01-24 ENCOUNTER — Other Ambulatory Visit: Payer: Self-pay

## 2012-01-24 ENCOUNTER — Telehealth: Payer: Self-pay

## 2012-01-24 DIAGNOSIS — N898 Other specified noninflammatory disorders of vagina: Secondary | ICD-10-CM

## 2012-01-24 MED ORDER — TINIDAZOLE 500 MG PO TABS: ORAL_TABLET | ORAL | Status: DC

## 2012-01-24 MED ORDER — TINIDAZOLE 500 MG PO TABS: ORAL_TABLET | ORAL | Status: AC

## 2012-01-24 NOTE — Telephone Encounter (Signed)
Ok to call in Tinidazole 500mg  per EP. Rx sent [facility] [address] per pt request. Pt notified and voiced understanding.

## 2012-04-30 ENCOUNTER — Other Ambulatory Visit (INDEPENDENT_AMBULATORY_CARE_PROVIDER_SITE_OTHER): Payer: BC Managed Care – PPO

## 2012-04-30 ENCOUNTER — Encounter: Payer: Self-pay | Admitting: Internal Medicine

## 2012-04-30 ENCOUNTER — Ambulatory Visit (INDEPENDENT_AMBULATORY_CARE_PROVIDER_SITE_OTHER): Payer: BC Managed Care – PPO | Admitting: Internal Medicine

## 2012-04-30 VITALS — BP 100/72 | HR 84 | Temp 98.5°F | Resp 16 | Wt 181.8 lb

## 2012-04-30 DIAGNOSIS — R140 Abdominal distension (gaseous): Secondary | ICD-10-CM

## 2012-04-30 DIAGNOSIS — R197 Diarrhea, unspecified: Secondary | ICD-10-CM

## 2012-04-30 DIAGNOSIS — D573 Sickle-cell trait: Secondary | ICD-10-CM

## 2012-04-30 DIAGNOSIS — R1011 Right upper quadrant pain: Secondary | ICD-10-CM

## 2012-04-30 DIAGNOSIS — R109 Unspecified abdominal pain: Secondary | ICD-10-CM

## 2012-04-30 DIAGNOSIS — R141 Gas pain: Secondary | ICD-10-CM

## 2012-04-30 DIAGNOSIS — G43009 Migraine without aura, not intractable, without status migrainosus: Secondary | ICD-10-CM

## 2012-04-30 DIAGNOSIS — F4323 Adjustment disorder with mixed anxiety and depressed mood: Secondary | ICD-10-CM

## 2012-04-30 DIAGNOSIS — R5383 Other fatigue: Secondary | ICD-10-CM

## 2012-04-30 DIAGNOSIS — R5381 Other malaise: Secondary | ICD-10-CM

## 2012-04-30 HISTORY — DX: Abdominal distension (gaseous): R14.0

## 2012-04-30 LAB — CBC WITH DIFFERENTIAL/PLATELET
Basophils Absolute: 0 10*3/uL (ref 0.0–0.1)
Basophils Relative: 0.4 % (ref 0.0–3.0)
Eosinophils Relative: 2.1 % (ref 0.0–5.0)
HCT: 38.3 % (ref 36.0–46.0)
Hemoglobin: 12.4 g/dL (ref 12.0–15.0)
Lymphocytes Relative: 26.9 % (ref 12.0–46.0)
Lymphs Abs: 1.6 10*3/uL (ref 0.7–4.0)
Monocytes Relative: 8.7 % (ref 3.0–12.0)
Neutro Abs: 3.6 10*3/uL (ref 1.4–7.7)
RBC: 4.21 Mil/uL (ref 3.87–5.11)
WBC: 5.8 10*3/uL (ref 4.5–10.5)

## 2012-04-30 LAB — URINALYSIS, ROUTINE W REFLEX MICROSCOPIC
Bilirubin Urine: NEGATIVE
Hgb urine dipstick: NEGATIVE
Nitrite: NEGATIVE
Total Protein, Urine: NEGATIVE
Urobilinogen, UA: 0.2 (ref 0.0–1.0)

## 2012-04-30 MED ORDER — ALIGN 4 MG PO CAPS: 1.0000 | ORAL_CAPSULE | Freq: Every day | ORAL | Status: DC

## 2012-04-30 MED ORDER — METRONIDAZOLE 500 MG PO TABS: 500.0000 mg | ORAL_TABLET | Freq: Three times a day (TID) | ORAL | Status: DC

## 2012-04-30 MED ORDER — OXYCODONE HCL 5 MG PO TABS: 5.0000 mg | ORAL_TABLET | ORAL | Status: DC | PRN

## 2012-04-30 NOTE — Progress Notes (Signed)
Patient ID: Angel [name], female   DOB: 03-21-1976, 36 y.o.   MRN: [id number]  Subjective:    Patient ID: Angel [name], female    DOB: [birth year], 36 y.o.   MRN: [id number]  Abdominal Pain This is a recurrent problem. The current episode started in the past 7 days. The onset quality is sudden. The most recent episode lasted 2 hours. The problem has been unchanged. The pain is located in the periumbilical region and generalized abdominal region. The pain is at a severity of 10/10. The pain is severe. The quality of the pain is colicky and sharp. The abdominal pain does not radiate. Associated symptoms include diarrhea. Pertinent negatives include no frequency, headaches or nausea. There is no history of abdominal surgery. tubal ligation 2006  Diarrhea  This is a new problem. The current episode started in the past 7 days. The problem has been unchanged. Associated symptoms include abdominal pain. Pertinent negatives include no chills, coughing or headaches. tubal ligation 2006     C/o heavier periods - LMP 8/28  Review of Systems  Constitutional: Negative for chills, activity change, appetite change, fatigue and unexpected weight change.  HENT: Negative for congestion, mouth sores and sinus pressure.   Eyes: Negative for visual disturbance.  Respiratory: Negative for cough and chest tightness.   Gastrointestinal: Positive for abdominal pain and diarrhea. Negative for nausea.  Genitourinary: Negative for frequency, difficulty urinating and vaginal pain.  Musculoskeletal: Negative for back pain and gait problem.  Skin: Negative for pallor and rash.  Neurological: Negative for dizziness, tremors, weakness, numbness and headaches.  Psychiatric/Behavioral: Negative for confusion and disturbed wake/sleep cycle. The patient is nervous/anxious (dtr is to have splenectomy).        Objective:   Physical Exam  Constitutional: She appears well-developed. No distress.       Obese  HENT:  Head:  Normocephalic.  Right Ear: External ear normal.  Left Ear: External ear normal.  Nose: Nose normal.  Mouth/Throat: Oropharynx is clear and moist.  Eyes: Conjunctivae normal are normal. Pupils are equal, round, and reactive to light. Right eye exhibits no discharge. Left eye exhibits no discharge.  Neck: Normal range of motion. Neck supple. No JVD present. No tracheal deviation present. No thyromegaly present.  Cardiovascular: Normal rate, regular rhythm and normal heart sounds.   Pulmonary/Chest: No stridor. No respiratory distress. She has no wheezes. She exhibits no tenderness.  Abdominal: Soft. Bowel sounds are normal. She exhibits distension (mild). She exhibits no mass. There is no tenderness. There is no rebound and no guarding.  Musculoskeletal: She exhibits no edema and no tenderness.  Lymphadenopathy:    She has no cervical adenopathy.  Neurological: She displays normal reflexes. No cranial nerve deficit. She exhibits normal muscle tone. Coordination normal.  Skin: No rash noted. No erythema.  Psychiatric: She has a normal mood and affect. Her behavior is normal. Judgment and thought content normal.          Assessment & Plan:

## 2012-04-30 NOTE — Assessment & Plan Note (Deleted)
9/13 2 hrs long cramping episodes associated w/diarrhea x 3 episodes

## 2012-04-30 NOTE — Assessment & Plan Note (Addendum)
9/13 2 hrs long cramping episodes associated w/diarrhea x 3 episodes Labs Flagyl [employer]

## 2012-04-30 NOTE — Assessment & Plan Note (Addendum)
9/13 2 hrs long cramping episodes associated w/diarrhea x 3 episodes Oxycodone prn R/o a viral illness, GS, pancreatitis and other dx's

## 2012-05-01 DIAGNOSIS — R109 Unspecified abdominal pain: Secondary | ICD-10-CM | POA: Insufficient documentation

## 2012-05-01 LAB — HEPATIC FUNCTION PANEL
ALT: 12 U/L (ref 0–35)
AST: 14 U/L (ref 0–37)
Total Bilirubin: 0.4 mg/dL (ref 0.3–1.2)
Total Protein: 7 g/dL (ref 6.0–8.3)

## 2012-05-01 LAB — BASIC METABOLIC PANEL
BUN: 14 mg/dL (ref 6–23)
CO2: 27 mEq/L (ref 19–32)
Chloride: 108 mEq/L (ref 96–112)
Glucose, Bld: 83 mg/dL (ref 70–99)
Potassium: 5.6 mEq/L — ABNORMAL HIGH (ref 3.5–5.1)

## 2012-05-01 LAB — LIPASE: Lipase: 30 U/L (ref 11.0–59.0)

## 2012-05-01 NOTE — Assessment & Plan Note (Signed)
9/13 2 hrs long cramping episodes associated w/diarrhea x 3 episodes Labs [place] if needed Oxycodone prn Prilosec

## 2012-05-01 NOTE — Assessment & Plan Note (Signed)
Continue with current prescription therapy as reflected on the Med list.  

## 2012-05-01 NOTE — Assessment & Plan Note (Signed)
I doubt abd pain is related 9/13

## 2012-05-01 NOTE — Assessment & Plan Note (Signed)
Discussed.

## 2012-05-06 ENCOUNTER — Encounter: Payer: Self-pay | Admitting: Internal Medicine

## 2012-05-07 ENCOUNTER — Ambulatory Visit (INDEPENDENT_AMBULATORY_CARE_PROVIDER_SITE_OTHER): Payer: BC Managed Care – PPO | Admitting: *Deleted

## 2012-05-07 ENCOUNTER — Telehealth: Payer: Self-pay

## 2012-05-07 DIAGNOSIS — Z23 Encounter for immunization: Secondary | ICD-10-CM

## 2012-05-07 NOTE — Telephone Encounter (Signed)
A user error has taken place: encounter opened in error, closed for administrative reasons.

## 2012-06-13 ENCOUNTER — Other Ambulatory Visit: Payer: Self-pay | Admitting: Internal Medicine

## 2012-06-13 NOTE — Telephone Encounter (Signed)
Last written 11/26/2011 #30 with 3 refills-please advise.

## 2012-06-20 MED ORDER — TRIAZOLAM 0.25 MG PO TABS: ORAL_TABLET | ORAL | Status: DC

## 2012-06-20 NOTE — Addendum Note (Signed)
Addended by: Carin Primrose on: 06/20/2012 09:18 AM   Modules accepted: Orders

## 2012-06-20 NOTE — Telephone Encounter (Signed)
Rx printed, awaiting MD's signature.  

## 2012-06-27 ENCOUNTER — Encounter: Payer: Self-pay | Admitting: Internal Medicine

## 2012-06-27 ENCOUNTER — Other Ambulatory Visit (INDEPENDENT_AMBULATORY_CARE_PROVIDER_SITE_OTHER): Payer: BC Managed Care – PPO

## 2012-06-27 ENCOUNTER — Ambulatory Visit (INDEPENDENT_AMBULATORY_CARE_PROVIDER_SITE_OTHER): Payer: BC Managed Care – PPO | Admitting: Internal Medicine

## 2012-06-27 VITALS — BP 100/70 | HR 80 | Temp 97.6°F | Resp 16 | Wt 183.0 lb

## 2012-06-27 DIAGNOSIS — M545 Low back pain, unspecified: Secondary | ICD-10-CM

## 2012-06-27 LAB — URINALYSIS, ROUTINE W REFLEX MICROSCOPIC
Bilirubin Urine: NEGATIVE
Hgb urine dipstick: NEGATIVE
Ketones, ur: NEGATIVE
Specific Gravity, Urine: 1.015 (ref 1.000–1.030)
Urine Glucose: NEGATIVE
Urobilinogen, UA: 0.2 (ref 0.0–1.0)

## 2012-06-27 MED ORDER — TRAMADOL HCL 50 MG PO TABS: 50.0000 mg | ORAL_TABLET | Freq: Two times a day (BID) | ORAL | Status: DC | PRN

## 2012-06-27 MED ORDER — TRIAZOLAM 0.25 MG PO TABS: ORAL_TABLET | ORAL | Status: DC

## 2012-06-27 NOTE — Assessment & Plan Note (Signed)
UA Abd US 

## 2012-06-27 NOTE — Progress Notes (Signed)
   Subjective:    Patient ID: Angel [name], female    DOB: [birth year], 36 y.o.   MRN: [id number]  Back Pain This is a recurrent problem. The current episode started 1 to 4 weeks ago. The problem occurs intermittently. The problem has been gradually worsening since onset. Pain location: L flank. The quality of the pain is described as aching. The pain does not radiate. The pain is at a severity of 8/10. The pain is moderate. Worse during: worse in am before urinating. Pertinent negatives include no numbness or weakness.     C/o heavier periods - LMP 8/28  Review of Systems  Constitutional: Negative for activity change, appetite change, fatigue and unexpected weight change.  HENT: Negative for congestion, mouth sores and sinus pressure.   Eyes: Negative for visual disturbance.  Respiratory: Negative for chest tightness.   Genitourinary: Negative for difficulty urinating and vaginal pain.  Musculoskeletal: Positive for back pain. Negative for gait problem.  Skin: Negative for pallor and rash.  Neurological: Negative for dizziness, tremors, weakness and numbness.  Psychiatric/Behavioral: Negative for confusion and sleep disturbance. The patient is nervous/anxious (dtr is to have splenectomy).        Objective:   Physical Exam  Constitutional: She appears well-developed. No distress.       Obese  HENT:  Head: Normocephalic.  Right Ear: External ear normal.  Left Ear: External ear normal.  Nose: Nose normal.  Mouth/Throat: Oropharynx is clear and moist.  Eyes: Conjunctivae normal are normal. Pupils are equal, round, and reactive to light. Right eye exhibits no discharge. Left eye exhibits no discharge.  Neck: Normal range of motion. Neck supple. No JVD present. No tracheal deviation present. No thyromegaly present.  Cardiovascular: Normal rate, regular rhythm and normal heart sounds.   Pulmonary/Chest: No stridor. No respiratory distress. She has no wheezes. She exhibits no  tenderness.  Abdominal: Soft. Bowel sounds are normal. She exhibits distension (mild). She exhibits no mass. There is no tenderness. There is no rebound and no guarding.  Musculoskeletal: She exhibits no edema and no tenderness.  Lymphadenopathy:    She has no cervical adenopathy.  Neurological: She displays normal reflexes. No cranial nerve deficit. She exhibits normal muscle tone. Coordination normal.  Skin: No rash noted. No erythema.  Psychiatric: She has a normal mood and affect. Her behavior is normal. Judgment and thought content normal.          Assessment & Plan:

## 2012-06-28 ENCOUNTER — Encounter: Payer: Self-pay | Admitting: Internal Medicine

## 2012-06-29 ENCOUNTER — Telehealth: Payer: Self-pay | Admitting: Internal Medicine

## 2012-06-29 DIAGNOSIS — M545 Low back pain, unspecified: Secondary | ICD-10-CM

## 2012-06-29 DIAGNOSIS — R350 Frequency of micturition: Secondary | ICD-10-CM

## 2012-06-29 DIAGNOSIS — E559 Vitamin D deficiency, unspecified: Secondary | ICD-10-CM

## 2012-06-29 MED ORDER — CIPROFLOXACIN HCL 500 MG PO TABS: 500.0000 mg | ORAL_TABLET | Freq: Two times a day (BID) | ORAL | Status: DC

## 2012-06-29 NOTE — Telephone Encounter (Signed)
Angel [name], please, inform patient that she may have a UTI - ols take Cipro Thx

## 2012-06-29 NOTE — Assessment & Plan Note (Signed)
UA abd [place] Abx if needed

## 2012-06-29 NOTE — Assessment & Plan Note (Signed)
UA

## 2012-06-29 NOTE — Assessment & Plan Note (Signed)
Continue with current prescription therapy as reflected on the Med list.  

## 2012-06-30 NOTE — Telephone Encounter (Signed)
Pt informed

## 2012-07-02 ENCOUNTER — Telehealth: Payer: Self-pay | Admitting: Internal Medicine

## 2012-07-02 ENCOUNTER — Ambulatory Visit
Admission: RE | Admit: 2012-07-02 | Discharge: 2012-07-02 | Disposition: A | Discharge status: Home / Self Care | Payer: BC Managed Care – PPO | Source: Ambulatory Visit | Attending: Internal Medicine | Admitting: Internal Medicine

## 2012-07-02 NOTE — Telephone Encounter (Signed)
Angel [name], please, inform patient that her abd [place] was nl Thx

## 2012-07-02 NOTE — Telephone Encounter (Signed)
Pt informed

## 2012-08-13 HISTORY — PX: BREAST CYST EXCISION: SHX579

## 2012-08-15 ENCOUNTER — Other Ambulatory Visit: Payer: Self-pay | Admitting: *Deleted

## 2012-08-15 NOTE — Telephone Encounter (Signed)
C/o back pain. NO relieve with Tramadol. She is taking 4 Ibuprofen. She is requesting something else to help. Please advise

## 2012-08-15 NOTE — Telephone Encounter (Signed)
Ok Vicodin #60. OV if not better Thx

## 2012-08-18 MED ORDER — HYDROCODONE-ACETAMINOPHEN 5-325 MG PO TABS
1.0000 | ORAL_TABLET | Freq: Two times a day (BID) | ORAL | Status: DC | PRN
Start: 2012-08-15 — End: 2012-10-22

## 2012-08-18 NOTE — Telephone Encounter (Signed)
Left detailed mess informing pt of below.  

## 2012-08-29 ENCOUNTER — Telehealth: Payer: Self-pay | Admitting: *Deleted

## 2012-08-29 DIAGNOSIS — Z0000 Encounter for general adult medical examination without abnormal findings: Secondary | ICD-10-CM

## 2012-08-29 NOTE — Telephone Encounter (Signed)
Message copied by Merrilyn Puma on Fri Aug 29, 2012 11:14 AM ------      Message from: Etheleen Sia      Created: Wed Aug 27, 2012 11:10 AM      Regarding: LABS       PHYSICAL LABS FOR FEB

## 2012-08-29 NOTE — Telephone Encounter (Signed)
Done

## 2012-09-22 ENCOUNTER — Other Ambulatory Visit (INDEPENDENT_AMBULATORY_CARE_PROVIDER_SITE_OTHER): Payer: BC Managed Care – PPO

## 2012-09-22 DIAGNOSIS — Z0000 Encounter for general adult medical examination without abnormal findings: Secondary | ICD-10-CM

## 2012-09-22 DIAGNOSIS — E538 Deficiency of other specified B group vitamins: Secondary | ICD-10-CM

## 2012-09-22 LAB — CBC WITH DIFFERENTIAL/PLATELET
Basophils Absolute: 0 10*3/uL (ref 0.0–0.1)
Eosinophils Absolute: 0 10*3/uL (ref 0.0–0.7)
Eosinophils Relative: 1.1 % (ref 0.0–5.0)
HCT: 38.3 % (ref 36.0–46.0)
Lymphs Abs: 1.6 10*3/uL (ref 0.7–4.0)
MCHC: 33 g/dL (ref 30.0–36.0)
MCV: 89.5 fl (ref 78.0–100.0)
Monocytes Absolute: 0.3 10*3/uL (ref 0.1–1.0)
Platelets: 232 10*3/uL (ref 150.0–400.0)
RDW: 13.7 % (ref 11.5–14.6)

## 2012-09-22 LAB — BASIC METABOLIC PANEL
BUN: 10 mg/dL (ref 6–23)
CO2: 26 mEq/L (ref 19–32)
Chloride: 104 mEq/L (ref 96–112)
Potassium: 3.5 mEq/L (ref 3.5–5.1)

## 2012-09-22 LAB — URINALYSIS, ROUTINE W REFLEX MICROSCOPIC
Bilirubin Urine: NEGATIVE
Nitrite: NEGATIVE
Total Protein, Urine: NEGATIVE
Urine Glucose: NEGATIVE

## 2012-09-22 LAB — HEPATIC FUNCTION PANEL
ALT: 13 U/L (ref 0–35)
AST: 17 U/L (ref 0–37)
Total Bilirubin: 0.6 mg/dL (ref 0.3–1.2)

## 2012-09-22 LAB — LIPID PANEL
Cholesterol: 171 mg/dL (ref 0–200)
LDL Cholesterol: 90 mg/dL (ref 0–99)

## 2012-09-22 LAB — VITAMIN B12: Vitamin B-12: 446 pg/mL (ref 211–911)

## 2012-09-23 ENCOUNTER — Telehealth: Payer: Self-pay | Admitting: Internal Medicine

## 2012-09-23 MED ORDER — ERGOCALCIFEROL 1.25 MG (50000 UT) PO CAPS: 50000.0000 [IU] | ORAL_CAPSULE | ORAL | Status: DC

## 2012-09-23 MED ORDER — CHOLECALCIFEROL 25 MCG (1000 UT) PO TABS: 2000.0000 [IU] | ORAL_TABLET | Freq: Every day | ORAL | Status: DC

## 2012-09-23 NOTE — Telephone Encounter (Signed)
Left detailed mess informing pt of below.  

## 2012-09-23 NOTE — Telephone Encounter (Signed)
Angel [name], please, inform patient that her vit d was low Stat Vit D Rx Thx

## 2012-09-26 ENCOUNTER — Ambulatory Visit: Payer: BC Managed Care – PPO | Admitting: Internal Medicine

## 2012-09-30 ENCOUNTER — Ambulatory Visit (INDEPENDENT_AMBULATORY_CARE_PROVIDER_SITE_OTHER): Payer: BC Managed Care – PPO | Admitting: Internal Medicine

## 2012-09-30 ENCOUNTER — Encounter: Payer: Self-pay | Admitting: Internal Medicine

## 2012-09-30 VITALS — BP 102/60 | HR 72 | Temp 97.0°F | Resp 16 | Ht 66.0 in | Wt 175.0 lb

## 2012-09-30 DIAGNOSIS — M545 Low back pain, unspecified: Secondary | ICD-10-CM

## 2012-09-30 DIAGNOSIS — E559 Vitamin D deficiency, unspecified: Secondary | ICD-10-CM

## 2012-09-30 DIAGNOSIS — F4323 Adjustment disorder with mixed anxiety and depressed mood: Secondary | ICD-10-CM

## 2012-09-30 DIAGNOSIS — Z0000 Encounter for general adult medical examination without abnormal findings: Secondary | ICD-10-CM

## 2012-09-30 DIAGNOSIS — G43009 Migraine without aura, not intractable, without status migrainosus: Secondary | ICD-10-CM

## 2012-09-30 DIAGNOSIS — R5381 Other malaise: Secondary | ICD-10-CM

## 2012-09-30 MED ORDER — TRIAZOLAM 0.25 MG PO TABS: ORAL_TABLET | ORAL | Status: DC

## 2012-09-30 NOTE — Assessment & Plan Note (Signed)
Started on Rx

## 2012-09-30 NOTE — Progress Notes (Signed)
Subjective:   The patient is here for a wellness exam. The patient has been doing well overall without major physical or psychological issues going on lately  .Back Pain This is a recurrent problem. The current episode started more than 1 year ago. The problem occurs intermittently. The problem has been gradually worsening since onset. The pain is present in the lumbar spine (L flank). The quality of the pain is described as aching. The pain does not radiate. The pain is at a severity of 8/10. The pain is moderate. Worse during: worse in am before urinating. Pertinent negatives include no abdominal pain, chest pain, dysuria, fever, numbness or weakness. The treatment provided moderate relief.     C/o heavier periods - first day  Wt Readings from Last 3 Encounters:  09/30/12 175 lb (79.379 kg)  06/27/12 183 lb (83.008 kg)  04/30/12 181 lb 12 oz (82.441 kg)   BP Readings from Last 3 Encounters:  09/30/12 102/60  06/27/12 100/70  04/30/12 100/72      Review of Systems  Constitutional: Negative for fever, chills, diaphoresis, activity change, appetite change, fatigue and unexpected weight change.  HENT: Negative for hearing loss, ear pain, congestion, sore throat, sneezing, mouth sores, neck pain, dental problem, voice change, postnasal drip and sinus pressure.   Eyes: Negative for pain and visual disturbance.  Respiratory: Negative for cough, chest tightness, wheezing and stridor.   Cardiovascular: Negative for chest pain, palpitations and leg swelling.  Gastrointestinal: Negative for nausea, vomiting, abdominal pain, blood in stool, abdominal distention and rectal pain.  Genitourinary: Negative for dysuria, hematuria, decreased urine volume, vaginal bleeding, vaginal discharge, difficulty urinating, vaginal pain and menstrual problem.  Musculoskeletal: Positive for back pain. Negative for joint swelling and gait problem.  Skin: Negative for color change, pallor, rash and wound.   Neurological: Negative for dizziness, tremors, syncope, speech difficulty, weakness, light-headedness and numbness.  Hematological: Negative for adenopathy.  Psychiatric/Behavioral: Negative for suicidal ideas, hallucinations, behavioral problems, confusion, sleep disturbance, dysphoric mood and decreased concentration. The patient is nervous/anxious (dtr is to have splenectomy). The patient is not hyperactive.        Objective:   Physical Exam  Constitutional: She appears well-developed. No distress.  Obese  HENT:  Head: Normocephalic.  Right Ear: External ear normal.  Left Ear: External ear normal.  Nose: Nose normal.  Mouth/Throat: Oropharynx is clear and moist.  Eyes: Conjunctivae are normal. Pupils are equal, round, and reactive to light. Right eye exhibits no discharge. Left eye exhibits no discharge.  Neck: Normal range of motion. Neck supple. No JVD present. No tracheal deviation present. No thyromegaly present.  Cardiovascular: Normal rate, regular rhythm and normal heart sounds.   Pulmonary/Chest: No stridor. No respiratory distress. She has no wheezes. She exhibits no tenderness.  Abdominal: Soft. Bowel sounds are normal. She exhibits distension (mild). She exhibits no mass. There is no tenderness. There is no rebound and no guarding.  Musculoskeletal: She exhibits no edema and no tenderness.  Lymphadenopathy:    She has no cervical adenopathy.  Neurological: She displays normal reflexes. No cranial nerve deficit. She exhibits normal muscle tone. Coordination normal.  Skin: No rash noted. No erythema.  Psychiatric: She has a normal mood and affect. Her behavior is normal. Judgment and thought content normal.    Lab Results  Component Value Date   WBC 4.3* 09/22/2012   HGB 12.7 09/22/2012   HCT 38.3 09/22/2012   PLT 232.0 09/22/2012   GLUCOSE 87 09/22/2012   CHOL  171 09/22/2012   TRIG 37.0 09/22/2012   HDL 73.50 09/22/2012   LDLCALC 90 09/22/2012   ALT 13 09/22/2012   AST  17 09/22/2012   NA 138 09/22/2012   K 3.5 09/22/2012   CL 104 09/22/2012   CREATININE 0.8 09/22/2012   BUN 10 09/22/2012   CO2 26 09/22/2012   TSH 1.64 09/22/2012         Assessment & Plan:

## 2012-09-30 NOTE — Assessment & Plan Note (Signed)
Recurrent multifactorial

## 2012-09-30 NOTE — Assessment & Plan Note (Signed)
Continue with current prescription therapy as reflected on the Med list.  

## 2012-09-30 NOTE — Assessment & Plan Note (Signed)
We discussed age appropriate health related issues, including available/recomended screening tests and vaccinations. We discussed a need for adhering to healthy diet and exercise. Labs/EKG were reviewed/ordered. All questions were answered.   

## 2012-09-30 NOTE — Assessment & Plan Note (Signed)
MSK recurrent Continue with current prescription therapy as reflected on the Med list.

## 2012-10-22 ENCOUNTER — Encounter: Payer: Self-pay | Admitting: Obstetrics and Gynecology

## 2012-10-22 ENCOUNTER — Ambulatory Visit: Payer: BC Managed Care – PPO | Admitting: Obstetrics and Gynecology

## 2012-10-22 VITALS — BP 110/62 | Temp 98.0°F | Ht 66.0 in | Wt 180.0 lb

## 2012-10-22 DIAGNOSIS — Z01419 Encounter for gynecological examination (general) (routine) without abnormal findings: Secondary | ICD-10-CM

## 2012-10-22 DIAGNOSIS — Z124 Encounter for screening for malignant neoplasm of cervix: Secondary | ICD-10-CM

## 2012-10-22 NOTE — Progress Notes (Signed)
Subjective:    Angel [name] is a 37 y.o. female, G2P2002, who presents for an annual exam. The patient reports dysmenorrhea x 1 year (seen).  Additionally has some discomfort with ovulation lasting x several days. Denies urinary tract or bowel symptoms, dyspareunia or history of fibroids.  Menstrual cycle:   LMP: Patient's last menstrual period was 10/17/2012.  flow x 2 days with tampon change every 3 hours with cramps rated 9/10 on a 10 point scale,  Ibuprofen 800 mg will decrease to 4/10              Review of Systems Pertinent items are noted in HPI. Denies pelvic pain, urinary tract symptoms, vaginitis symptoms, irregular bleeding, menopausal symptoms, change in bowel habits or rectal bleeding   Objective:    BP 110/62  Temp(Src) 98 F (36.7 C) (Oral)  Ht 5\' 6"  (1.676 m)  Wt 180 lb (81.647 kg)  BMI 29.07 kg/m2  LMP 10/17/2012    Wt Readings from Last 1 Encounters:  10/22/12 180 lb (81.647 kg)   Body mass index is 29.07 kg/(m^2). General Appearance: Alert, no acute distress HEENT: Grossly normal Neck / Thyroid: Supple, no thyromegaly or cervical adenopathy Lungs: Clear to auscultation bilaterally Back: No CVA tenderness Breast Exam: No masses or nodes.No dimpling, nipple retraction or discharge. Cardiovascular: Regular rate and rhythm.  Gastrointestinal: Soft, non-tender, no masses or organomegaly Pelvic Exam: EGBUS-wnl, vagina-normal rugae, cervix- without lesions or tenderness, uterus appears normal size shape and consistency, adnexae-no masses or tenderness Lymphatic Exam: Non-palpable nodes in neck, clavicular,  axillary, or inguinal regions  Skin: no rashes or abnormalities Extremities: no clubbing cyanosis or edema  Neurologic: grossly normal Psychiatric: Alert and oriented  Assessment:   Routine GYN Exam Dysmenorrhea   Plan:  Calcium with magnesium for menstrual cramps  PAP sent  RTO 1 year or prn  POWELL,ELMIRAPA-C

## 2012-10-22 NOTE — Progress Notes (Signed)
Regular Periods: yes Mammogram: yes  Monthly Breast Ex.: yes Exercise: yes 2 x weekly 30 min-1 hour  Tetanus < 10 years: yes2006 Seatbelts: yes  NI. Bladder Functn.: yes Abuse at home: no  Daily BM's: yes Stressful Work: yes  Healthy Diet: yes Sigmoid-Colonoscopy: no  Calcium: no Medical problems this year: pt states the cramps with cycle are very painful.   LAST PAP:11/12  Contraception: btl  Mammogram:  2013  Had 4 cyst in left breast nl  PCP: Dr. Posey Rea  PMH: no change  FMH: no change  Last Bone Scan: no  Pt is married

## 2012-10-28 LAB — PAP IG W/ RFLX HPV ASCU

## 2012-11-24 ENCOUNTER — Other Ambulatory Visit: Payer: Self-pay | Admitting: Internal Medicine

## 2012-11-29 ENCOUNTER — Other Ambulatory Visit: Payer: Self-pay | Admitting: Internal Medicine

## 2012-12-02 NOTE — Telephone Encounter (Signed)
Triazolam called to pharmacy  

## 2013-01-09 ENCOUNTER — Ambulatory Visit (INDEPENDENT_AMBULATORY_CARE_PROVIDER_SITE_OTHER): Payer: BC Managed Care – PPO | Admitting: Internal Medicine

## 2013-01-09 ENCOUNTER — Other Ambulatory Visit: Payer: Self-pay | Admitting: Internal Medicine

## 2013-01-09 ENCOUNTER — Encounter: Payer: Self-pay | Admitting: Internal Medicine

## 2013-01-09 ENCOUNTER — Telehealth: Payer: Self-pay | Admitting: *Deleted

## 2013-01-09 VITALS — BP 110/78 | HR 80 | Temp 97.9°F | Ht 66.0 in | Wt 179.0 lb

## 2013-01-09 DIAGNOSIS — G43009 Migraine without aura, not intractable, without status migrainosus: Secondary | ICD-10-CM

## 2013-01-09 MED ORDER — METHYLPREDNISOLONE ACETATE 80 MG/ML IJ SUSP
80.0000 mg | Freq: Once | INTRAMUSCULAR | Status: AC
  Administered 2013-01-09: 80 mg via INTRAMUSCULAR

## 2013-01-09 MED ORDER — KETOROLAC TROMETHAMINE 30 MG/ML IJ SOLN
30.0000 mg | Freq: Once | INTRAMUSCULAR | Status: AC
  Administered 2013-01-09: 30 mg via INTRAMUSCULAR

## 2013-01-09 NOTE — Progress Notes (Signed)
Subjective:    Patient ID: Angel [name], female    DOB: [birth year], 37 y.o.   MRN: [id number]  HPI  Pt presents to the clinic today with c/o headache. This started yesterday morning. It has been a constant ache 7/10. She denies sensitivity to light or sound. She has not nausea or vomiting but she has no appetite. She has taken Ibuprofen for the pain but it has not relieved her headache. She does have a history of migraines. It has been a while since she had a migraine but she reports this feels the same. She has tried to sleep it off but it has not helped.  Review of Systems      Past Medical History  Diagnosis Date  . Allergic rhinitis   . Migraine headache   . Sickle cell trait   . Insomnia   . Vitamin D deficiency 2009    Current Outpatient Prescriptions  Medication Sig Dispense Refill  . cetirizine (ZYRTEC) 10 MG tablet Take 10 mg by mouth daily.        . Cholecalciferol 1000 UNITS tablet Take 2 tablets (2,000 Units total) by mouth daily.  100 tablet  3  . [facility] IBUPROFEN IB PO Take by mouth.      . Dapsone (ACZONE EX) Apply topically 2 (two) times daily.      Marland Kitchen doxycycline (VIBRA-TABS) 100 MG tablet Take 100 mg by mouth daily.      . traMADol (ULTRAM) 50 MG tablet Take 1-2 tablets (50-100 mg total) by mouth 2 (two) times daily as needed.  100 tablet  3  . triazolam (HALCION) 0.25 MG tablet TAKE 1 TO 2 TABLETS BY MOUTH AT BEDTIME AS NEEDED  30 tablet  2   No current facility-administered medications for this visit.    Allergies  Allergen Reactions  . Naproxen     REACTION: upset stomach  . Phentermine Hcl     REACTION: HAs  . Sumatriptan     REACTION: upset stomach  . Topiramate     REACTION: ?  . Trazodone Hcl     REACTION: too strong    Family History  Problem Relation Age of Onset  . Hypertension Father   . Diabetes Father   . Heart disease Father   . Sickle cell anemia Daughter   . Sickle cell anemia Son     History   Social History  . Marital  Status: Married    Spouse Name: N/A    Number of Children: 2  . Years of Education: N/A   Occupational History  . Not on file.   Social History Main Topics  . Smoking status: Never Smoker   . Smokeless tobacco: Never Used  . Alcohol Use: No  . Drug Use: No  . Sexually Active: Yes    Birth Control/ Protection: Surgical     Comment: BTL   Other Topics Concern  . Not on file   Social History Narrative  . No narrative on file     Constitutional: Pt reports headache. Denies fever, malaise, fatigue, or abrupt weight changes.  HEENT: Denies eye pain, eye redness, ear pain, ringing in the ears, wax buildup, runny nose, nasal congestion, bloody nose, or sore throat. Neurological: Denies dizziness, difficulty with memory, difficulty with speech or problems with balance and coordination.   No other specific complaints in a complete review of systems (except as listed in HPI above).  Objective:   Physical Exam  BP 110/78  Pulse 80  Temp(Src) 97.9 F (36.6 C) (Oral)  Ht 5\' 6"  (1.676 m)  Wt 179 lb (81.194 kg)  BMI 28.91 kg/m2  SpO2 99%  LMP 12/30/2012 Wt Readings from Last 3 Encounters:  01/09/13 179 lb (81.194 kg)  10/22/12 180 lb (81.647 kg)  09/30/12 175 lb (79.379 kg)    General: Appears her stated age, well developed, well nourished in NAD. HEENT: Head: normal shape and size; Eyes: sclera white, no icterus, conjunctiva pink, PERRLA and EOMs intact; Ears: Tm's gray and intact, normal light reflex; Nose: mucosa pink and moist, septum midline; Throat/Mouth: Teeth present, mucosa pink and moist, no exudate, lesions or ulcerations noted. .  Cardiovascular: Normal rate and rhythm. S1,S2 noted.  No murmur, rubs or gallops noted. No JVD or BLE edema. No carotid bruits noted. Pulmonary/Chest: Normal effort and positive vesicular breath sounds. No respiratory distress. No wheezes, rales or ronchi noted.  Neurological: Alert and oriented. Cranial nerves II-XII intact. Coordination  normal. +DTRs bilaterally.        Assessment & Plan:   Migraine without status migrainosus:  Will give 80 mg Depo IM and 30 mg Toradol IM Work note provided Go home and try to get some rest, drink plenty of H20  RTC if pain persist or worsens or if you have associated dizziness or changes in vision

## 2013-01-09 NOTE — Telephone Encounter (Signed)
Pt calling c/o [facility] since yesterday. She states she has taken ibuprofen 800 mg with no relieve. I advised OV today with Nicki Reaper, NP. Transferred to scheduler for appt.

## 2013-01-09 NOTE — Addendum Note (Signed)
Addended by: Brenton Grills C on: 01/09/2013 12:01 PM   Modules accepted: Orders

## 2013-01-09 NOTE — Patient Instructions (Signed)
Migraine Headache A migraine headache is an intense, throbbing pain on one or both sides of your head. A migraine can last for 30 minutes to several hours. CAUSES  The exact cause of a migraine headache is not always known. However, a migraine may be caused when nerves in the brain become irritated and release chemicals that cause inflammation. This causes pain. SYMPTOMS  Pain on one or both sides of your head.  Pulsating or throbbing pain.  Severe pain that prevents daily activities.  Pain that is aggravated by any physical activity.  Nausea, vomiting, or both.  Dizziness.  Pain with exposure to bright lights, loud noises, or activity.  General sensitivity to bright lights, loud noises, or smells. Before you get a migraine, you may get warning signs that a migraine is coming (aura). An aura may include:  Seeing flashing lights.  Seeing bright spots, halos, or zig-zag lines.  Having tunnel vision or blurred vision.  Having feelings of numbness or tingling.  Having trouble talking.  Having muscle weakness. MIGRAINE TRIGGERS  Alcohol.  Smoking.  Stress.  Menstruation.  Aged cheeses.  Foods or drinks that contain nitrates, glutamate, aspartame, or tyramine.  Lack of sleep.  Chocolate.  Caffeine.  Hunger.  Physical exertion.  Fatigue.  Medicines used to treat chest pain (nitroglycerine), birth control pills, estrogen, and some blood pressure medicines. DIAGNOSIS  A migraine headache is often diagnosed based on:  Symptoms.  Physical examination.  A CT scan or MRI of your head. TREATMENT Medicines may be given for pain and nausea. Medicines can also be given to help prevent recurrent migraines.  HOME CARE INSTRUCTIONS  Only take over-the-counter or prescription medicines for pain or discomfort as directed by your caregiver. The use of long-term narcotics is not recommended.  Lie down in a dark, quiet room when you have a migraine.  Keep a journal  to find out what may trigger your migraine headaches. For example, write down:  What you eat and drink.  How much sleep you get.  Any change to your diet or medicines.  Limit alcohol consumption.  Quit smoking if you smoke.  Get 7 to 9 hours of sleep, or as recommended by your caregiver.  Limit stress.  Keep lights dim if bright lights bother you and make your migraines worse. SEEK IMMEDIATE MEDICAL CARE IF:   Your migraine becomes severe.  You have a fever.  You have a stiff neck.  You have vision loss.  You have muscular weakness or loss of muscle control.  You start losing your balance or have trouble walking.  You feel faint or pass out.  You have severe symptoms that are different from your first symptoms. MAKE SURE YOU:   Understand these instructions.  Will watch your condition.  Will get help right away if you are not doing well or get worse. Document Released: 07/30/2005 Document Revised: 10/22/2011 Document Reviewed: 07/20/2011 [employer] Patient Information 2014 [employer].  

## 2013-02-06 ENCOUNTER — Telehealth: Payer: Self-pay

## 2013-02-06 DIAGNOSIS — R51 Headache: Secondary | ICD-10-CM

## 2013-02-06 NOTE — Telephone Encounter (Signed)
Referral done

## 2013-02-06 NOTE — Telephone Encounter (Signed)
Phone call from Dr Plotnikov's patient stating she has had a migraine since yesterday along with some dizziness. She took Exedrin Migraine yesterday and Advil migrain today and this has not helped. Please advise. thanks

## 2013-02-06 NOTE — Telephone Encounter (Signed)
I would try excedrin migraine or tylenol migraine as these can help

## 2013-02-06 NOTE — Telephone Encounter (Signed)
Sorry, she has already taken this  Not sure what to say as she is allergic to other meds  I can refer to [facility] if she wants

## 2013-02-06 NOTE — Telephone Encounter (Signed)
Referral is ok

## 2013-03-25 ENCOUNTER — Other Ambulatory Visit: Payer: Self-pay | Admitting: Internal Medicine

## 2013-03-31 ENCOUNTER — Encounter: Payer: Self-pay | Admitting: Internal Medicine

## 2013-03-31 ENCOUNTER — Ambulatory Visit (INDEPENDENT_AMBULATORY_CARE_PROVIDER_SITE_OTHER): Payer: 59 | Admitting: Internal Medicine

## 2013-03-31 VITALS — BP 104/72 | HR 64 | Temp 99.4°F | Resp 16 | Wt 170.0 lb

## 2013-03-31 DIAGNOSIS — M545 Low back pain, unspecified: Secondary | ICD-10-CM

## 2013-03-31 DIAGNOSIS — J069 Acute upper respiratory infection, unspecified: Secondary | ICD-10-CM

## 2013-03-31 DIAGNOSIS — R5381 Other malaise: Secondary | ICD-10-CM

## 2013-03-31 DIAGNOSIS — G43009 Migraine without aura, not intractable, without status migrainosus: Secondary | ICD-10-CM

## 2013-03-31 NOTE — Patient Instructions (Addendum)

## 2013-03-31 NOTE — Assessment & Plan Note (Signed)
Better  

## 2013-03-31 NOTE — Assessment & Plan Note (Signed)
Continue with current prescription therapy as reflected on the Med list.  

## 2013-03-31 NOTE — Assessment & Plan Note (Signed)
Resolved

## 2013-03-31 NOTE — Progress Notes (Signed)
Subjective:    HPI C/o URI sx's x 2 d Pt presents to the clinic today to f/u on headaches - better on Topamax.  She does have a history of migraines.   Review of Systems      Past Medical History  Diagnosis Date  . Allergic rhinitis   . Migraine headache   . Sickle cell trait   . Insomnia   . Vitamin D deficiency 2009    Current Outpatient Prescriptions  Medication Sig Dispense Refill  . cetirizine (ZYRTEC) 10 MG tablet Take 10 mg by mouth daily.        . Cholecalciferol 1000 UNITS tablet Take 2 tablets (2,000 Units total) by mouth daily.  100 tablet  3  . [facility] IBUPROFEN IB PO Take by mouth.      . Dapsone (ACZONE EX) Apply topically 2 (two) times daily.      Marland Kitchen topiramate (TOPAMAX) 25 MG tablet Take 75 mg by mouth at bedtime as needed.      . traMADol (ULTRAM) 50 MG tablet Take 1-2 tablets (50-100 mg total) by mouth 2 (two) times daily as needed.  100 tablet  3  . triazolam (HALCION) 0.25 MG tablet TAKE 1 TO 2 TABLETS BY MOUTH AT BEDTIME AS NEEDED  30 tablet  2  . Vitamin D, Ergocalciferol, (DRISDOL) 50000 UNITS CAPS capsule TAKE 1 CAPSULE (50,000 UNITS TOTAL) BY MOUTH ONCE A WEEK.  6 capsule  3   No current facility-administered medications for this visit.    Allergies  Allergen Reactions  . Naproxen     REACTION: upset stomach  . Phentermine Hcl     REACTION: HAs  . Sumatriptan     REACTION: upset stomach  . Topiramate     REACTION: ?  . Trazodone Hcl     REACTION: too strong    Family History  Problem Relation Age of Onset  . Hypertension Father   . Diabetes Father   . Heart disease Father   . Sickle cell anemia Daughter   . Sickle cell anemia Son     History   Social History  . Marital Status: Married    Spouse Name: N/A    Number of Children: 2  . Years of Education: N/A   Occupational History  . Not on file.   Social History Main Topics  . Smoking status: Never Smoker   . Smokeless tobacco: Never Used  . Alcohol Use: No  . Drug Use: No   . Sexual Activity: Yes    Birth Control/ Protection: Surgical     Comment: BTL   Other Topics Concern  . Not on file   Social History Narrative  . No narrative on file     Constitutional: Pt reports headache. Denies fever, malaise, fatigue, or abrupt weight changes.  HEENT: Denies eye pain, eye redness, ear pain, ringing in the ears, wax buildup, runny nose, nasal congestion, bloody nose, or sore throat. Neurological: Denies dizziness, difficulty with memory, difficulty with speech or problems with balance and coordination.   No other specific complaints in a complete review of systems (except as listed in HPI above).  Objective:   Physical Exam  BP 104/72  Pulse 64  Temp(Src) 99.4 F (37.4 C) (Oral)  Resp 16  Wt 170 lb (77.111 kg)  BMI 27.45 kg/m2 Wt Readings from Last 3 Encounters:  03/31/13 170 lb (77.111 kg)  01/09/13 179 lb (81.194 kg)  10/22/12 180 lb (81.647 kg)    General: Appears  her stated age, well developed, well nourished in NAD. HEENT: Head: normal shape and size; Eyes: sclera white, no icterus, conjunctiva pink, PERRLA and EOMs intact; Ears: Tm's gray and intact, normal light reflex; Nose: mucosa pink and moist, septum midline; Throat/Mouth: Teeth present, mucosa pink and moist, no exudate, lesions or ulcerations noted. .  Cardiovascular: Normal rate and rhythm. S1,S2 noted.  No murmur, rubs or gallops noted. No JVD or BLE edema. No carotid bruits noted. Pulmonary/Chest: Normal effort and positive vesicular breath sounds. No respiratory distress. No wheezes, rales or ronchi noted.  Neurological: Alert and oriented. Cranial nerves II-XII intact. Coordination normal. +DTRs bilaterally.        Assessment & Plan:

## 2013-03-31 NOTE — Assessment & Plan Note (Signed)
OTC meds 

## 2013-04-22 ENCOUNTER — Ambulatory Visit (INDEPENDENT_AMBULATORY_CARE_PROVIDER_SITE_OTHER): Payer: 59 | Admitting: Internal Medicine

## 2013-04-22 ENCOUNTER — Encounter: Payer: Self-pay | Admitting: Internal Medicine

## 2013-04-22 VITALS — BP 106/62 | HR 80 | Temp 98.8°F | Ht 66.0 in | Wt 170.5 lb

## 2013-04-22 DIAGNOSIS — M5416 Radiculopathy, lumbar region: Secondary | ICD-10-CM

## 2013-04-22 DIAGNOSIS — IMO0002 Reserved for concepts with insufficient information to code with codable children: Secondary | ICD-10-CM

## 2013-04-22 MED ORDER — CYCLOBENZAPRINE HCL 5 MG PO TABS: 5.0000 mg | ORAL_TABLET | Freq: Three times a day (TID) | ORAL | Status: DC | PRN

## 2013-04-22 MED ORDER — PREDNISONE 10 MG PO TABS: ORAL_TABLET | ORAL | Status: DC

## 2013-04-22 MED ORDER — OXYCODONE HCL 5 MG PO TABS: ORAL_TABLET | ORAL | Status: DC

## 2013-04-22 NOTE — Progress Notes (Signed)
Subjective:    Patient ID: Angel [name], female    DOB: 1975/12/08, 37 y.o.   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  No prior hx of same, though has some minor pain intermittent for short time to the right lower back only for many years.  Pt states no bowel or bladder change, fever, wt loss, or falls.  Right handed  Past Medical History  Diagnosis Date  . Allergic rhinitis   . Migraine headache   . Sickle cell trait   . Insomnia   . Vitamin D deficiency 2009   Past Surgical History  Procedure Laterality Date  . Hand surgery    . Wisdom tooth extraction      reports that she has never smoked. She has never used smokeless tobacco. She reports that she does not drink alcohol or use illicit drugs. family history includes Diabetes in her father; Heart disease in her father; Hypertension in her father; Sickle cell anemia in her daughter and son. Allergies  Allergen Reactions  . Naproxen     REACTION: upset stomach  . Phentermine Hcl     REACTION: HAs  . Sumatriptan     REACTION: upset stomach  . Topiramate     REACTION: ?  . Trazodone Hcl     REACTION: too strong   Current Outpatient Prescriptions on File Prior to Visit  Medication Sig Dispense Refill  . cetirizine (ZYRTEC) 10 MG tablet Take 10 mg by mouth daily.        . Cholecalciferol 1000 UNITS tablet Take 2 tablets (2,000 Units total) by mouth daily.  100 tablet  3  . CVS IBUPROFEN IB PO Take by mouth.      . Dapsone (ACZONE EX) Apply topically 2 (two) times daily.      Marland Kitchen topiramate (TOPAMAX) 25 MG tablet Take 75 mg by mouth at bedtime as needed.      . triazolam (HALCION) 0.25 MG tablet TAKE 1 TO 2 TABLETS BY MOUTH AT BEDTIME AS NEEDED  30 tablet  2  . Vitamin D, Ergocalciferol, (DRISDOL) 50000 UNITS CAPS capsule TAKE 1 CAPSULE (50,000 UNITS TOTAL) BY MOUTH ONCE  A WEEK.  6 capsule  3   No current facility-administered medications on file prior to visit.   Review of Systems  Constitutional: Negative for unexpected weight change, or unusual diaphoresis  HENT: Negative for tinnitus.   Eyes: Negative for photophobia and visual disturbance.  Respiratory: Negative for choking and stridor.   Gastrointestinal: Negative for vomiting and blood in stool.  Genitourinary: Negative for hematuria and decreased urine volume.  Musculoskeletal: Negative for acute joint swelling Skin: Negative for color change and wound.  Neurological: Negative for tremors and numbness other than noted  Psychiatric/Behavioral: Negative for decreased concentration or  hyperactivity.       Objective:   Physical Exam BP 106/62  Pulse 80  Temp(Src) 98.8 F (37.1 C) (Oral)  Ht 5\' 6"  (1.676 m)  Wt 170 lb 8 oz (77.338 kg)  BMI 27.53 kg/m2  SpO2 97% VS noted,  Constitutional: Pt appears well-developed and well-nourished.  HENT: Head: NCAT.  Right Ear: External ear normal.  Left Ear: External ear normal.  Eyes: Conjunctivae and EOM are normal. Pupils are equal, round, and reactive to light.  Neck: Normal range of motion. Neck supple.  Cardiovascular: Normal rate and regular rhythm.   Pulmonary/Chest: Effort normal and breath sounds normal.  Abd:  Soft, NT, non-distended, + BS Neurological: Pt is alert. Not confused , cn 2-12 intact, motor 5/5 except for 4/5 distal RLE, decr sens to l5 area, and diminished achilles reflex Skin: Skin is warm. No erythema. No edema Psychiatric: Pt behavior is normal. Thought content normal.     Assessment & Plan:

## 2013-04-22 NOTE — Patient Instructions (Signed)
OK to stop the tramadol Please take all new medication as prescribed - the oxycodone for pain, flexeril (generic) for muscle relaxer, and prednisone for anti-inflammatory Please continue all other medications as before  You will be contacted regarding the referral for: MRI for the lower back, and orthopedic

## 2013-04-23 NOTE — Assessment & Plan Note (Signed)
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

## 2013-04-28 ENCOUNTER — Other Ambulatory Visit: Payer: 59

## 2013-04-30 ENCOUNTER — Telehealth: Payer: Self-pay | Admitting: Internal Medicine

## 2013-04-30 NOTE — Telephone Encounter (Signed)
Rec'd from [place] forward 3 pages to Dr. Posey Rea

## 2013-05-26 ENCOUNTER — Other Ambulatory Visit: Payer: Self-pay | Admitting: Obstetrics and Gynecology

## 2013-05-26 DIAGNOSIS — N63 Unspecified lump in unspecified breast: Secondary | ICD-10-CM

## 2013-06-05 ENCOUNTER — Ambulatory Visit
Admission: RE | Admit: 2013-06-05 | Discharge: 2013-06-05 | Disposition: A | Discharge status: Home / Self Care | Payer: 59 | Source: Ambulatory Visit | Attending: Obstetrics and Gynecology | Admitting: Obstetrics and Gynecology

## 2013-06-05 DIAGNOSIS — N63 Unspecified lump in unspecified breast: Secondary | ICD-10-CM

## 2013-06-08 ENCOUNTER — Telehealth (INDEPENDENT_AMBULATORY_CARE_PROVIDER_SITE_OTHER): Payer: Self-pay | Admitting: Surgery

## 2013-06-08 ENCOUNTER — Encounter (INDEPENDENT_AMBULATORY_CARE_PROVIDER_SITE_OTHER): Payer: Self-pay | Admitting: Surgery

## 2013-06-08 ENCOUNTER — Ambulatory Visit (INDEPENDENT_AMBULATORY_CARE_PROVIDER_SITE_OTHER): Payer: 59 | Admitting: Surgery

## 2013-06-08 VITALS — BP 122/82 | HR 64 | Resp 16 | Ht 66.0 in | Wt 174.0 lb

## 2013-06-08 DIAGNOSIS — N63 Unspecified lump in unspecified breast: Secondary | ICD-10-CM

## 2013-06-08 DIAGNOSIS — N6320 Unspecified lump in the left breast, unspecified quadrant: Secondary | ICD-10-CM | POA: Insufficient documentation

## 2013-06-08 DIAGNOSIS — N6310 Unspecified lump in the right breast, unspecified quadrant: Secondary | ICD-10-CM

## 2013-06-08 HISTORY — DX: Unspecified lump in unspecified breast: N63.0

## 2013-06-08 NOTE — Progress Notes (Signed)
Patient ID: ALOHA BARTOK, female   DOB: [birth year], 37 y.o.   MRN: [id number]  Chief Complaint  Patient presents with  . New Evaluation    eval lt br mass x2    HPI Angel [name] is a 37 y.o. female.   HPI This is a very pleasant female referred by Dr. Stefano [name] and Dr. Posey [name] for evaluation of bilateral breast masses. She's had a small subcutaneous nodules for some time. 1 most recently appeared several months ago and has become increasingly uncomfortable. She has had some skin changes but no drainage. She denies nipple discharge. She has no previous history of breast cancer or need for biopsies. She is otherwise without complaints. Past Medical History  Diagnosis Date  . Allergic rhinitis   . Migraine headache   . Sickle cell trait   . Insomnia   . Vitamin D deficiency 2009    Past Surgical History  Procedure Laterality Date  . Hand surgery    . Wisdom tooth extraction    . Tubal ligation      Family History  Problem Relation Age of Onset  . Hypertension Father   . Diabetes Father   . Heart disease Father   . Cancer Father     prostate  . Sickle cell anemia Daughter   . Sickle cell anemia Son     Social History History  Substance Use Topics  . Smoking status: Never Smoker   . Smokeless tobacco: Never Used  . Alcohol Use: No    Allergies  Allergen Reactions  . Naproxen     REACTION: upset stomach  . Phentermine Hcl     REACTION: HAs  . Sumatriptan     REACTION: upset stomach  . Topiramate     REACTION: ?  . Trazodone Hcl     REACTION: too strong    Current Outpatient Prescriptions  Medication Sig Dispense Refill  . cephALEXin (KEFLEX) 500 MG capsule       . cetirizine (ZYRTEC) 10 MG tablet Take 10 mg by mouth daily.        . Cholecalciferol 1000 UNITS tablet Take 2 tablets (2,000 Units total) by mouth daily.  100 tablet  3  . CVS IBUPROFEN IB PO Take by mouth.      . cyclobenzaprine (FLEXERIL) 5 MG tablet Take 1 tablet (5 mg total) by mouth 3  (three) times daily as needed for muscle spasms.  60 tablet  1  . Dapsone (ACZONE EX) Apply topically 2 (two) times daily.      . predniSONE (DELTASONE) 10 MG tablet 3 tabs by mouth per day for 3 days,2tabs per day for 3 days,1tab per day for 3 days  18 tablet  0  . topiramate (TOPAMAX) 25 MG tablet Take 75 mg by mouth at bedtime as needed.      . triazolam (HALCION) 0.25 MG tablet TAKE 1 TO 2 TABLETS BY MOUTH AT BEDTIME AS NEEDED  30 tablet  2  . Vitamin D, Ergocalciferol, (DRISDOL) 50000 UNITS CAPS capsule TAKE 1 CAPSULE (50,000 UNITS TOTAL) BY MOUTH ONCE A WEEK.  6 capsule  3  . oxyCODONE (OXY IR/ROXICODONE) 5 MG immediate release tablet 1-2 tabs by mouth every 6 hrs as needed for pain  60 tablet  0   No current facility-administered medications for this visit.    Review of Systems Review of Systems  Constitutional: Negative for fever, chills and unexpected weight change.  HENT: Negative for congestion, hearing loss, sore throat, trouble  swallowing and voice change.   Eyes: Negative for visual disturbance.  Respiratory: Negative for cough and wheezing.   Cardiovascular: Negative for chest pain, palpitations and leg swelling.  Gastrointestinal: Negative for nausea, vomiting, abdominal pain, diarrhea, constipation, blood in stool, abdominal distention and anal bleeding.  Genitourinary: Negative for hematuria, vaginal bleeding and difficulty urinating.  Musculoskeletal: Negative for arthralgias.  Skin: Negative for rash and wound.  Neurological: Negative for seizures, syncope and headaches.  Hematological: Negative for adenopathy. Does not bruise/bleed easily.  Psychiatric/Behavioral: Negative for confusion.    Blood pressure 122/82, pulse 64, resp. rate 16, height 5\' 6"  (1.676 m), weight 174 lb (78.926 kg), last menstrual period 05/22/2013.  Physical Exam Physical Exam  Constitutional: She is oriented to person, place, and time. She appears well-developed and well-nourished. No  distress.  HENT:  Head: Normocephalic and atraumatic.  Right Ear: External ear normal.  Left Ear: External ear normal.  Nose: Nose normal.  Mouth/Throat: Oropharynx is clear and moist. No oropharyngeal exudate.  Eyes: Conjunctivae are normal. Pupils are equal, round, and reactive to light. Right eye exhibits no discharge. Left eye exhibits no discharge. No scleral icterus.  Neck: Normal range of motion. Neck supple. No tracheal deviation present.  Cardiovascular: Normal rate, regular rhythm, normal heart sounds and intact distal pulses.   No murmur heard. Pulmonary/Chest: Effort normal and breath sounds normal. No respiratory distress. She has no wheezes.  Abdominal: Soft. Bowel sounds are normal. She exhibits no distension. There is no tenderness.  Musculoskeletal: Normal range of motion. She exhibits no edema and no tenderness.  Lymphadenopathy:    She has no cervical adenopathy.    She has no axillary adenopathy.  Neurological: She is alert and oriented to person, place, and time.  Skin: Skin is warm and dry. No rash noted. She is not diaphoretic. No erythema.  Psychiatric: Her behavior is normal. Judgment normal.  Breasts:  The left breast, there is a 1 cm mass at the 9:00 of the breast. Just inferior to this is a 4 mm mass. There is also a less than 1 centimeter mass at the 12:00 position just below the clavicle. She also has a 4 mm nodule at the 3:00 position of the right breast. There are skin changes at the 9:00 mass On the left breast.  Data Reviewed I have reviewed the patient's mammograms and ultrasounds demonstrating the masses  Assessment    Bilateral breast masses     Plan    Surgical excision in the operating room is recommended for all masses for histologic evaluation to rule out malignancy. I do suspect several of these may be sebaceous cysts but given the location in the breast, excision is highly recommended. I discussed the risks of surgery which includes but is  not limited to bleeding, infection, need for further surgery should malignancy be present, recurrence, etc. She understands and wishes to proceed. Surgery will be scheduled        Angel [name] A 06/08/2013, 9:13 AM

## 2013-06-08 NOTE — Telephone Encounter (Signed)
Patient met with surgery scheduling went over financial responsibilities, patient will call back to schedule, face sheet placed in pending °

## 2013-06-13 DIAGNOSIS — N63 Unspecified lump in unspecified breast: Secondary | ICD-10-CM

## 2013-06-13 HISTORY — DX: Unspecified lump in unspecified breast: N63.0

## 2013-06-16 ENCOUNTER — Ambulatory Visit (INDEPENDENT_AMBULATORY_CARE_PROVIDER_SITE_OTHER): Payer: 59

## 2013-06-16 DIAGNOSIS — Z23 Encounter for immunization: Secondary | ICD-10-CM

## 2013-07-03 ENCOUNTER — Encounter (HOSPITAL_BASED_OUTPATIENT_CLINIC_OR_DEPARTMENT_OTHER): Payer: Self-pay | Admitting: *Deleted

## 2013-07-13 ENCOUNTER — Encounter (HOSPITAL_BASED_OUTPATIENT_CLINIC_OR_DEPARTMENT_OTHER)
Admission: RE | Disposition: A | Discharge status: Home / Self Care | Payer: Self-pay | Source: Ambulatory Visit | Attending: Surgery

## 2013-07-13 ENCOUNTER — Ambulatory Visit (HOSPITAL_BASED_OUTPATIENT_CLINIC_OR_DEPARTMENT_OTHER)
Admission: RE | Admit: 2013-07-13 | Discharge: 2013-07-13 | Disposition: A | Discharge status: Home / Self Care | Payer: 59 | Source: Ambulatory Visit | Attending: Surgery | Admitting: Surgery

## 2013-07-13 ENCOUNTER — Ambulatory Visit (HOSPITAL_BASED_OUTPATIENT_CLINIC_OR_DEPARTMENT_OTHER): Payer: 59 | Admitting: Anesthesiology

## 2013-07-13 ENCOUNTER — Encounter (HOSPITAL_BASED_OUTPATIENT_CLINIC_OR_DEPARTMENT_OTHER): Payer: 59 | Admitting: Anesthesiology

## 2013-07-13 ENCOUNTER — Encounter (HOSPITAL_BASED_OUTPATIENT_CLINIC_OR_DEPARTMENT_OTHER): Payer: Self-pay | Admitting: *Deleted

## 2013-07-13 DIAGNOSIS — N6009 Solitary cyst of unspecified breast: Secondary | ICD-10-CM | POA: Insufficient documentation

## 2013-07-13 DIAGNOSIS — D573 Sickle-cell trait: Secondary | ICD-10-CM | POA: Insufficient documentation

## 2013-07-13 HISTORY — DX: Reserved for concepts with insufficient information to code with codable children: IMO0002

## 2013-07-13 HISTORY — DX: Unspecified lump in unspecified breast: N63.0

## 2013-07-13 HISTORY — PX: BREAST BIOPSY: SHX20

## 2013-07-13 SURGERY — BREAST BIOPSY
Anesthesia: General | Site: Breast | Laterality: Bilateral | Wound class: Clean

## 2013-07-13 MED ORDER — OXYCODONE HCL 5 MG PO TABS
ORAL_TABLET | ORAL | Status: AC
  Filled 2013-07-13: qty 1

## 2013-07-13 MED ORDER — HYDROMORPHONE HCL PF 1 MG/ML IJ SOLN
INTRAMUSCULAR | Status: AC
  Filled 2013-07-13: qty 1

## 2013-07-13 MED ORDER — HYDROMORPHONE HCL PF 1 MG/ML IJ SOLN
0.2500 mg | INTRAMUSCULAR | Status: DC | PRN
  Administered 2013-07-13: 0.5 mg via INTRAVENOUS

## 2013-07-13 MED ORDER — OXYCODONE HCL 5 MG/5ML PO SOLN: 5.0000 mg | Freq: Once | ORAL | Status: AC | PRN

## 2013-07-13 MED ORDER — PROMETHAZINE HCL 25 MG/ML IJ SOLN: 6.2500 mg | INTRAMUSCULAR | Status: DC | PRN

## 2013-07-13 MED ORDER — CEFAZOLIN SODIUM-DEXTROSE 2-3 GM-% IV SOLR: 2.0000 g | INTRAVENOUS | Status: DC

## 2013-07-13 MED ORDER — MIDAZOLAM HCL 2 MG/2ML IJ SOLN: 1.0000 mg | INTRAMUSCULAR | Status: DC | PRN

## 2013-07-13 MED ORDER — ONDANSETRON HCL 4 MG/2ML IJ SOLN
INTRAMUSCULAR | Status: DC | PRN
  Administered 2013-07-13: 4 mg via INTRAVENOUS

## 2013-07-13 MED ORDER — PROPOFOL 10 MG/ML IV BOLUS
INTRAVENOUS | Status: DC | PRN
  Administered 2013-07-13: 200 mg via INTRAVENOUS

## 2013-07-13 MED ORDER — FENTANYL CITRATE 0.05 MG/ML IJ SOLN
INTRAMUSCULAR | Status: DC | PRN
  Administered 2013-07-13 (×4): 25 ug via INTRAVENOUS
  Administered 2013-07-13: 100 ug via INTRAVENOUS

## 2013-07-13 MED ORDER — HYDROCODONE-ACETAMINOPHEN 5-325 MG PO TABS: 1.0000 | ORAL_TABLET | ORAL | Status: DC | PRN

## 2013-07-13 MED ORDER — MIDAZOLAM HCL 2 MG/2ML IJ SOLN
INTRAMUSCULAR | Status: AC
  Filled 2013-07-13: qty 2

## 2013-07-13 MED ORDER — FENTANYL CITRATE 0.05 MG/ML IJ SOLN: 50.0000 ug | INTRAMUSCULAR | Status: DC | PRN

## 2013-07-13 MED ORDER — DEXAMETHASONE SODIUM PHOSPHATE 4 MG/ML IJ SOLN
INTRAMUSCULAR | Status: DC | PRN
  Administered 2013-07-13: 10 mg via INTRAVENOUS

## 2013-07-13 MED ORDER — CEFAZOLIN SODIUM-DEXTROSE 2-3 GM-% IV SOLR
INTRAVENOUS | Status: AC
  Filled 2013-07-13: qty 50

## 2013-07-13 MED ORDER — MIDAZOLAM HCL 5 MG/5ML IJ SOLN
INTRAMUSCULAR | Status: DC | PRN
  Administered 2013-07-13: 2 mg via INTRAVENOUS

## 2013-07-13 MED ORDER — BUPIVACAINE-EPINEPHRINE PF 0.5-1:200000 % IJ SOLN
INTRAMUSCULAR | Status: AC
  Filled 2013-07-13: qty 30

## 2013-07-13 MED ORDER — MIDAZOLAM HCL 2 MG/ML PO SYRP: 12.0000 mg | ORAL_SOLUTION | Freq: Once | ORAL | Status: DC | PRN

## 2013-07-13 MED ORDER — OXYCODONE HCL 5 MG PO TABS
5.0000 mg | ORAL_TABLET | Freq: Once | ORAL | Status: AC | PRN
  Administered 2013-07-13: 5 mg via ORAL

## 2013-07-13 MED ORDER — LACTATED RINGERS IV SOLN
INTRAVENOUS | Status: DC
  Administered 2013-07-13 (×2): via INTRAVENOUS

## 2013-07-13 MED ORDER — BUPIVACAINE-EPINEPHRINE 0.5% -1:200000 IJ SOLN
INTRAMUSCULAR | Status: DC | PRN
  Administered 2013-07-13: 8 mL

## 2013-07-13 MED ORDER — FENTANYL CITRATE 0.05 MG/ML IJ SOLN
INTRAMUSCULAR | Status: AC
  Filled 2013-07-13: qty 6

## 2013-07-13 MED ORDER — LIDOCAINE HCL (CARDIAC) 20 MG/ML IV SOLN
INTRAVENOUS | Status: DC | PRN
  Administered 2013-07-13: 100 mg via INTRAVENOUS

## 2013-07-13 SURGICAL SUPPLY — 40 items
APL SKNCLS STERI-STRIP NONHPOA (GAUZE/BANDAGES/DRESSINGS) ×1
BENZOIN TINCTURE PRP APPL 2/3 (GAUZE/BANDAGES/DRESSINGS) ×2 IMPLANT
BLADE HEX COATED 2.75 (ELECTRODE) ×2 IMPLANT
BLADE SURG 15 STRL LF DISP TIS (BLADE) ×1 IMPLANT
BLADE SURG 15 STRL SS (BLADE) ×2
CANISTER SUCT 1200ML W/VALVE (MISCELLANEOUS) IMPLANT
CHLORAPREP W/TINT 26ML (MISCELLANEOUS) ×2 IMPLANT
CLIP TI WIDE RED SMALL 6 (CLIP) IMPLANT
COVER MAYO STAND STRL (DRAPES) ×2 IMPLANT
COVER TABLE BACK 60X90 (DRAPES) ×2 IMPLANT
DECANTER SPIKE VIAL GLASS SM (MISCELLANEOUS) IMPLANT
DEVICE DUBIN W/COMP PLATE 8390 (MISCELLANEOUS) IMPLANT
DRAPE PED LAPAROTOMY (DRAPES) ×2 IMPLANT
DRAPE UTILITY XL STRL (DRAPES) ×2 IMPLANT
DRSG TEGADERM 4X4.75 (GAUZE/BANDAGES/DRESSINGS) ×2 IMPLANT
ELECT REM PT RETURN 9FT ADLT (ELECTROSURGICAL) ×2
ELECTRODE REM PT RTRN 9FT ADLT (ELECTROSURGICAL) ×1 IMPLANT
GAUZE SPONGE 4X4 12PLY STRL LF (GAUZE/BANDAGES/DRESSINGS) ×2 IMPLANT
GLOVE ECLIPSE 6.5 STRL STRAW (GLOVE) ×1 IMPLANT
GLOVE SURG SIGNA 7.5 PF LTX (GLOVE) ×2 IMPLANT
GOWN PREVENTION PLUS XLARGE (GOWN DISPOSABLE) ×2 IMPLANT
GOWN PREVENTION PLUS XXLARGE (GOWN DISPOSABLE) ×2 IMPLANT
KIT MARKER MARGIN INK (KITS) ×2 IMPLANT
NDL HYPO 25X1 1.5 SAFETY (NEEDLE) ×1 IMPLANT
NEEDLE HYPO 25X1 1.5 SAFETY (NEEDLE) ×2 IMPLANT
NS IRRIG 1000ML POUR BTL (IV SOLUTION) ×2 IMPLANT
PACK BASIN DAY SURGERY FS (CUSTOM PROCEDURE TRAY) ×2 IMPLANT
PENCIL BUTTON HOLSTER BLD 10FT (ELECTRODE) ×2 IMPLANT
SLEEVE SCD COMPRESS KNEE MED (MISCELLANEOUS) IMPLANT
SPONGE LAP 4X18 X RAY DECT (DISPOSABLE) ×2 IMPLANT
STRIP CLOSURE SKIN 1/2X4 (GAUZE/BANDAGES/DRESSINGS) ×2 IMPLANT
SUT MNCRL AB 4-0 PS2 18 (SUTURE) ×2 IMPLANT
SUT SILK 2 0 SH (SUTURE) ×2 IMPLANT
SUT VIC AB 3-0 SH 27 (SUTURE) ×2
SUT VIC AB 3-0 SH 27X BRD (SUTURE) ×1 IMPLANT
SYR CONTROL 10ML LL (SYRINGE) ×2 IMPLANT
TOWEL OR 17X24 6PK STRL BLUE (TOWEL DISPOSABLE) ×2 IMPLANT
TOWEL OR NON WOVEN STRL DISP B (DISPOSABLE) ×2 IMPLANT
TUBE CONNECTING 20X1/4 (TUBING) IMPLANT
YANKAUER SUCT BULB TIP NO VENT (SUCTIONS) IMPLANT

## 2013-07-13 NOTE — Anesthesia Postprocedure Evaluation (Signed)
  Anesthesia Post-op Note  Patient: Angel [name]  Procedure(s) Performed: Procedure(s): BILATERAL EXCISION BREAS MASSES (Bilateral)  Patient Location: PACU  Anesthesia Type:General  Level of Consciousness: awake  Airway and Oxygen Therapy: Patient Spontanous Breathing  Post-op Pain: mild  Post-op Assessment: Post-op Vital signs reviewed  Post-op Vital Signs: stable  Complications: No apparent anesthesia complications

## 2013-07-13 NOTE — Op Note (Signed)
BILATERAL EXCISION BREAS MASSES  Procedure Note  Angel [name] 07/13/2013   Pre-op Diagnosis: bilateral breast masses     Post-op Diagnosis: same  Procedure(s): BILATERAL EXCISION BREAS MASSES  Surgeon(s): Shelly Rubenstein, MD  Anesthesia: General  Staff:  Circulator: Aleen Sells, RN Relief Circulator: Lorenza Evangelist, RN Scrub Person: Michelle Nasuti, RN  Estimated Blood Loss: Minimal               Specimens: sent to path          [facility] A   Date: 07/13/2013  Time: 9:36 AM

## 2013-07-13 NOTE — Anesthesia Procedure Notes (Signed)
Procedure Name: LMA Insertion Date/Time: 07/13/2013 9:12 AM Performed by: Caren Macadam Pre-anesthesia Checklist: Patient identified, Emergency Drugs available, Suction available and Patient being monitored Patient Re-evaluated:Patient Re-evaluated prior to inductionOxygen Delivery Method: Circle System Utilized Preoxygenation: Pre-oxygenation with 100% oxygen Intubation Type: IV induction Ventilation: Mask ventilation without difficulty LMA: LMA inserted LMA Size: 4.0 Number of attempts: 1 Airway Equipment and Method: bite block Placement Confirmation: positive ETCO2 and breath sounds checked- equal and bilateral Tube secured with: Tape Dental Injury: Teeth and Oropharynx as per pre-operative assessment

## 2013-07-13 NOTE — Anesthesia Preprocedure Evaluation (Signed)
Anesthesia Evaluation  Patient identified by MRN, date of birth, ID band Patient awake    Reviewed: Allergy & Precautions, H&P , NPO status , Patient's Chart, lab work & pertinent test results  Airway Mallampati: I  Neck ROM: Full    Dental   Pulmonary  breath sounds clear to auscultation        Cardiovascular negative cardio ROS  Rhythm:Regular Rate:Normal     Neuro/Psych  Headaches, PSYCHIATRIC DISORDERS  Neuromuscular disease    GI/Hepatic Neg liver ROS,   Endo/Other    Renal/GU      Musculoskeletal  (+) Fibromyalgia -  Abdominal   Peds  Hematology  (+) Sickle cell trait ,   Anesthesia Other Findings   Reproductive/Obstetrics                           Anesthesia Physical Anesthesia Plan  ASA: II  Anesthesia Plan: General   Post-op Pain Management:    Induction: Intravenous  Airway Management Planned: LMA  Additional Equipment:   Intra-op Plan:   Post-operative Plan: Extubation in OR  Informed Consent: I have reviewed the patients History and Physical, chart, labs and discussed the procedure including the risks, benefits and alternatives for the proposed anesthesia with the patient or authorized representative who has indicated his/her understanding and acceptance.   Dental advisory given  Plan Discussed with: CRNA  Anesthesia Plan Comments:         Anesthesia Quick Evaluation

## 2013-07-13 NOTE — Transfer of Care (Signed)
Immediate Anesthesia Transfer of Care Note  Patient: Angel [name]  Procedure(s) Performed: Procedure(s): BILATERAL EXCISION BREAS MASSES (Bilateral)  Patient Location: PACU  Anesthesia Type:General  Level of Consciousness: awake and alert   Airway & Oxygen Therapy: Patient Spontanous Breathing and Patient connected to face mask oxygen  Post-op Assessment: Report given to PACU RN and Post -op Vital signs reviewed and stable  Post vital signs: Reviewed and stable  Complications: No apparent anesthesia complications

## 2013-07-13 NOTE — H&P (Signed)
Patient ID: Angel [name], female DOB: 02-26-1976, 37 y.o. MRN: [id number]  Chief Complaint   Patient presents with   .  New Evaluation     eval lt br mass x2   HPI  Angel [name] is a 37 y.o. female.  HPI  This is a very pleasant female referred by Dr. Stefano Gaul and Dr. Posey Rea for evaluation of bilateral breast masses. She's had a small subcutaneous nodules for some time. 1 most recently appeared several months ago and has become increasingly uncomfortable. She has had some skin changes but no drainage. She denies nipple discharge. She has no previous history of breast cancer or need for biopsies. She is otherwise without complaints.  Past Medical History   Diagnosis  Date   .  Allergic rhinitis    .  Migraine headache    .  Sickle cell trait    .  Insomnia    .  Vitamin D deficiency  2009    Past Surgical History   Procedure  Laterality  Date   .  Hand surgery     .  Wisdom tooth extraction     .  Tubal ligation      Family History   Problem  Relation  Age of Onset   .  Hypertension  Father    .  Diabetes  Father    .  Heart disease  Father    .  Cancer  Father      prostate   .  Sickle cell anemia  Daughter    .  Sickle cell anemia  Son    Social History  History   Substance Use Topics   .  Smoking status:  Never Smoker   .  Smokeless tobacco:  Never Used   .  Alcohol Use:  No    Allergies   Allergen  Reactions   .  Naproxen      REACTION: upset stomach   .  Phentermine Hcl      REACTION: HAs   .  Sumatriptan      REACTION: upset stomach   .  Topiramate      REACTION: ?   .  Trazodone Hcl      REACTION: too strong    Current Outpatient Prescriptions   Medication  Sig  Dispense  Refill   .  cephALEXin (KEFLEX) 500 MG capsule      .  cetirizine (ZYRTEC) 10 MG tablet  Take 10 mg by mouth daily.     .  Cholecalciferol 1000 UNITS tablet  Take 2 tablets (2,000 Units total) by mouth daily.  100 tablet  3   .  [employer]S IBUPROFEN IB PO  Take by mouth.     .   cyclobenzaprine (FLEXERIL) 5 MG tablet  Take 1 tablet (5 mg total) by mouth 3 (three) times daily as needed for muscle spasms.  60 tablet  1   .  Dapsone (ACZONE EX)  Apply topically 2 (two) times daily.     .  predniSONE (DELTASONE) 10 MG tablet  3 tabs by mouth per day for 3 days,2tabs per day for 3 days,1tab per day for 3 days  18 tablet  0   .  topiramate (TOPAMAX) 25 MG tablet  Take 75 mg by mouth at bedtime as needed.     .  triazolam (HALCION) 0.25 MG tablet  TAKE 1 TO 2 TABLETS BY MOUTH AT BEDTIME AS NEEDED  30 tablet  2   .  Vitamin D, Ergocalciferol, (DRISDOL) 50000 UNITS CAPS capsule  TAKE 1 CAPSULE (50,000 UNITS TOTAL) BY MOUTH ONCE A WEEK.  6 capsule  3   .  oxyCODONE (OXY IR/ROXICODONE) 5 MG immediate release tablet  1-2 tabs by mouth every 6 hrs as needed for pain  60 tablet  0    No current facility-administered medications for this visit.   Review of Systems  Review of Systems  Constitutional: Negative for fever, chills and unexpected weight change.  HENT: Negative for congestion, hearing loss, sore throat, trouble swallowing and voice change.  Eyes: Negative for visual disturbance.  Respiratory: Negative for cough and wheezing.  Cardiovascular: Negative for chest pain, palpitations and leg swelling.  Gastrointestinal: Negative for nausea, vomiting, abdominal pain, diarrhea, constipation, blood in stool, abdominal distention and anal bleeding.  Genitourinary: Negative for hematuria, vaginal bleeding and difficulty urinating.  Musculoskeletal: Negative for arthralgias.  Skin: Negative for rash and wound.  Neurological: Negative for seizures, syncope and headaches.  Hematological: Negative for adenopathy. Does not bruise/bleed easily.  Psychiatric/Behavioral: Negative for confusion.  Blood pressure 122/82, pulse 64, resp. rate 16, height 5\' 6"  (1.676 m), weight 174 lb (78.926 kg), last menstrual period 05/22/2013.   Physical Exam  Physical Exam  Constitutional: She is  oriented to person, place, and time. She appears well-developed and well-nourished. No distress.  HENT:  Head: Normocephalic and atraumatic.  Right Ear: External ear normal.  Left Ear: External ear normal.  Nose: Nose normal.  Mouth/Throat: Oropharynx is clear and moist. No oropharyngeal exudate.  Eyes: Conjunctivae are normal. Pupils are equal, round, and reactive to light. Right eye exhibits no discharge. Left eye exhibits no discharge. No scleral icterus.  Neck: Normal range of motion. Neck supple. No tracheal deviation present.  Cardiovascular: Normal rate, regular rhythm, normal heart sounds and intact distal pulses.  No murmur heard.  Pulmonary/Chest: Effort normal and breath sounds normal. No respiratory distress. She has no wheezes.  Abdominal: Soft. Bowel sounds are normal. She exhibits no distension. There is no tenderness.  Musculoskeletal: Normal range of motion. She exhibits no edema and no tenderness.  Lymphadenopathy:  She has no cervical adenopathy.  She has no axillary adenopathy.  Neurological: She is alert and oriented to person, place, and time.  Skin: Skin is warm and dry. No rash noted. She is not diaphoretic. No erythema.  Psychiatric: Her behavior is normal. Judgment normal.  Breasts: The left breast, there is a 1 cm mass at the 9:00 of the breast. Just inferior to this is a 4 mm mass. There is also a less than 1 centimeter mass at the 12:00 position just below the clavicle. She also has a 4 mm nodule at the 3:00 position of the right breast. There are skin changes at the 9:00 mass On the left breast.   Data Reviewed  I have reviewed the patient's mammograms and ultrasounds demonstrating the masses  Assessment   Bilateral breast masses   Plan  Surgical excision in the operating room is recommended for all masses for histologic evaluation to rule out malignancy. I do suspect several of these may be sebaceous cysts but given the location in the breast, excision is  highly recommended. I discussed the risks of surgery which includes but is not limited to bleeding, infection, need for further surgery should malignancy be present, recurrence, etc. She understands and wishes to proceed. Surgery will be scheduled

## 2013-07-14 ENCOUNTER — Encounter (HOSPITAL_BASED_OUTPATIENT_CLINIC_OR_DEPARTMENT_OTHER): Payer: Self-pay | Admitting: Surgery

## 2013-07-14 NOTE — Op Note (Signed)
[name], [name]              ACCOUNT NO.:  [id number]  MEDICAL RECORD NO.:  [id number]  LOCATION:                               FACILITY:  [facility]  PHYSICIAN:  Abigail Miyamoto, M.D. DATE OF BIRTH:  03/01/1976  DATE OF PROCEDURE:  07/13/2013 DATE OF DISCHARGE:  07/13/2013                              OPERATIVE REPORT   PREOPERATIVE DIAGNOSIS:  Bilateral breast mass.  POSTOPERATIVE DIAGNOSIS:  Bilateral breast mass.  PROCEDURE:  Excision of bilateral breast masses.  SURGEON:  Abigail Miyamoto, M.D.  ANESTHESIA:  General and 0.5% Marcaine.  ESTIMATED BLOOD LOSS:  Minimal.  INDICATIONS:  This is a 37 year old female who has had multiple masses which appear in the skin on both her breast. Decision was made to proceed with excision.  FINDINGS:  The patient was found to have 3 separate breast masses, 1 on the right breast and 2 in the left breast.  All appeared consistent with benign cysts.  PROCEDURE IN DETAIL:  The patient was brought to the operating room, identified as Angel [name].  She was placed supine on the operating room table and general anesthesia was induced.  Her bilateral breasts and chest were then prepped and draped in the usual sterile fashion. The first mass was in the right breast.  This was medial in the breast. I anesthetized the skin with Marcaine.  I then made a small incision with a scalpel and went straight to the mass which appeared consistent with a small cyst with the yellow fluid in the cyst.  I excised the cyst in its entirety with a scalpel and sent to the Pathology for evaluation. Next, I anesthetized the skin in 3 separate areas on the left breast.  I first made an incision at the 12 o'clock position over palpable mass.  I took this down to the breast tissue with the cautery and again with a hemostat, I was able to identify the small cyst which I again removed in its entirety and sent to Pathology.  Next, I anesthetized the skin at the 9  o'clock position with the Marcaine.  I made a small incision with a scalpel and identified the cyst.  I actually removed an ellipse of skin.  There appeared to be 2 cysts in this area on physical examination, but once I removed the cyst it began apparent there was one large cyst at the 9 and 8 o'clock positions.  Once I removed this cyst with a scalpel, I sent to Pathology and thoroughly explored the area and found no other palpable masses.  Again, all 3 were consistent with small cyst.  I anesthetized the wounds further with Marcaine.  Hemostasis was achieved with the cautery.  I then closed the incisions with interrupted 3-0 Vicryl sutures, 4-0 Monocryl and Dermabond.  Steri- Strips were then applied.  The patient tolerated the procedure well. All the counts were correct at the end of the procedure.  The patient was then extubated in the operating room and taken in stable condition to recovery room.     Abigail Miyamoto, M.D.   ______________________________ Abigail Miyamoto, M.D.    DB/MEDQ  D:  07/13/2013  T:  07/14/2013  Job:  [id number] 

## 2013-07-22 ENCOUNTER — Telehealth (INDEPENDENT_AMBULATORY_CARE_PROVIDER_SITE_OTHER): Payer: Self-pay | Admitting: *Deleted

## 2013-07-22 NOTE — Telephone Encounter (Signed)
Patient called to report that she has some blood coming from around the glue at the incision site.  I explained to patient this is normal and nothing to be concerned about.  Encouraged patient to keep an eye on the area and if she notices a white colored drainage to give [place] a call back.  Patient denies any redness or fevers at this time.  Patient states understanding and agreeable at this time.

## 2013-07-29 ENCOUNTER — Encounter (INDEPENDENT_AMBULATORY_CARE_PROVIDER_SITE_OTHER): Payer: Self-pay | Admitting: Surgery

## 2013-07-29 ENCOUNTER — Ambulatory Visit (INDEPENDENT_AMBULATORY_CARE_PROVIDER_SITE_OTHER): Payer: 59 | Admitting: Surgery

## 2013-07-29 VITALS — BP 110/70 | Resp 16 | Ht 66.0 in | Wt 177.0 lb

## 2013-07-29 DIAGNOSIS — Z09 Encounter for follow-up examination after completed treatment for conditions other than malignant neoplasm: Secondary | ICD-10-CM

## 2013-07-29 NOTE — Progress Notes (Signed)
Subjective:     Patient ID: Angel [name], female   DOB: 02-10-1976, 37 y.o.   MRN: [id number]  HPI  She is here for first postop visit status post excision of bilateral breast masses. She is doing well Review of Systems     Objective:   Physical Exam On exam, all incisions are healing well. The final pathology showed all to be benign cysts with no evidence of malignancy    Assessment:     Patient stable postop     Plan:     I explained the pathology to her. There is nothing further to do given the benign nature. I would still recommend excision if these occur again on the breast areas. She will come back and see me for any breast problems

## 2013-08-03 ENCOUNTER — Ambulatory Visit
Admission: RE | Admit: 2013-08-03 | Discharge: 2013-08-03 | Disposition: A | Discharge status: Home / Self Care | Payer: 59 | Source: Ambulatory Visit | Attending: Internal Medicine | Admitting: Internal Medicine

## 2013-08-03 DIAGNOSIS — M5416 Radiculopathy, lumbar region: Secondary | ICD-10-CM

## 2013-10-01 ENCOUNTER — Ambulatory Visit: Payer: 59 | Admitting: Internal Medicine

## 2013-10-07 ENCOUNTER — Ambulatory Visit (INDEPENDENT_AMBULATORY_CARE_PROVIDER_SITE_OTHER): Payer: 59 | Admitting: Internal Medicine

## 2013-10-07 ENCOUNTER — Other Ambulatory Visit (INDEPENDENT_AMBULATORY_CARE_PROVIDER_SITE_OTHER): Payer: 59

## 2013-10-07 ENCOUNTER — Encounter: Payer: Self-pay | Admitting: Internal Medicine

## 2013-10-07 VITALS — BP 118/74 | HR 76 | Temp 98.1°F | Resp 16 | Wt 178.0 lb

## 2013-10-07 DIAGNOSIS — G4700 Insomnia, unspecified: Secondary | ICD-10-CM

## 2013-10-07 DIAGNOSIS — E559 Vitamin D deficiency, unspecified: Secondary | ICD-10-CM

## 2013-10-07 DIAGNOSIS — G43009 Migraine without aura, not intractable, without status migrainosus: Secondary | ICD-10-CM

## 2013-10-07 HISTORY — DX: Insomnia, unspecified: G47.00

## 2013-10-07 LAB — BASIC METABOLIC PANEL
BUN: 14 mg/dL (ref 6–23)
CALCIUM: 9.2 mg/dL (ref 8.4–10.5)
CHLORIDE: 106 meq/L (ref 96–112)
CO2: 27 mEq/L (ref 19–32)
Creatinine, Ser: 0.8 mg/dL (ref 0.4–1.2)
GFR: 103.55 mL/min (ref >60.00)
Glucose, Bld: 90 mg/dL (ref 70–99)
Potassium: 3.9 mEq/L (ref 3.5–5.1)
SODIUM: 138 meq/L (ref 135–145)

## 2013-10-07 LAB — URINALYSIS
Bilirubin Urine: NEGATIVE
Hgb urine dipstick: NEGATIVE
Leukocytes, UA: NEGATIVE
Nitrite: NEGATIVE
PH: 6 (ref 5.0–8.0)
Specific Gravity, Urine: 1.015 (ref 1.000–1.030)
TOTAL PROTEIN, URINE-UPE24: NEGATIVE
URINE GLUCOSE: NEGATIVE
UROBILINOGEN UA: 0.2 (ref 0.0–1.0)

## 2013-10-07 LAB — CBC WITH DIFFERENTIAL/PLATELET
BASOS PCT: 1 % (ref 0.0–3.0)
Basophils Absolute: 0.1 10*3/uL (ref 0.0–0.1)
EOS ABS: 0 10*3/uL (ref 0.0–0.7)
EOS PCT: 0.7 % (ref 0.0–5.0)
HCT: 38.2 % (ref 36.0–46.0)
HEMOGLOBIN: 12.4 g/dL (ref 12.0–15.0)
LYMPHS PCT: 30.1 % (ref 12.0–46.0)
Lymphs Abs: 1.7 10*3/uL (ref 0.7–4.0)
MCHC: 32.5 g/dL (ref 30.0–36.0)
MCV: 90.8 fl (ref 78.0–100.0)
Monocytes Absolute: 0.4 10*3/uL (ref 0.1–1.0)
Monocytes Relative: 6.2 % (ref 3.0–12.0)
Neutro Abs: 3.6 10*3/uL (ref 1.4–7.7)
Neutrophils Relative %: 62 % (ref 43.0–77.0)
Platelets: 237 10*3/uL (ref 150.0–400.0)
RBC: 4.21 Mil/uL (ref 3.87–5.11)
RDW: 13.5 % (ref 11.5–14.6)
WBC: 5.8 10*3/uL (ref 4.5–10.5)

## 2013-10-07 LAB — HEPATIC FUNCTION PANEL
ALT: 16 U/L (ref 0–35)
AST: 16 U/L (ref 0–37)
Albumin: 4.1 g/dL (ref 3.5–5.2)
Alkaline Phosphatase: 46 U/L (ref 39–117)
Bilirubin, Direct: 0 mg/dL (ref 0.0–0.3)
Total Bilirubin: 0.6 mg/dL (ref 0.3–1.2)
Total Protein: 7.4 g/dL (ref 6.0–8.3)

## 2013-10-07 LAB — TSH: TSH: 1.06 u[IU]/mL (ref 0.35–5.50)

## 2013-10-07 LAB — VITAMIN B12: Vitamin B-12: 385 pg/mL (ref 211–911)

## 2013-10-07 MED ORDER — ZOLPIDEM TARTRATE 10 MG PO TABS: 5.0000 mg | ORAL_TABLET | Freq: Every evening | ORAL | Status: DC | PRN

## 2013-10-07 MED ORDER — DAPSONE 5 % EX GEL: CUTANEOUS | Status: DC

## 2013-10-07 NOTE — Assessment & Plan Note (Signed)
Labs  Continue with current prescription therapy as reflected on the Med list.  

## 2013-10-07 NOTE — Progress Notes (Deleted)
Pre visit review using our clinic review tool, if applicable. No additional management support is needed unless otherwise documented below in the visit note. 

## 2013-10-07 NOTE — Progress Notes (Signed)
Subjective:    HPI F/u LBP - she had an MRI and saw a sports med specialist  Pt presents to the clinic today to f/u on headaches - better on Topamax.  She does have a history of migraines.   Review of Systems      Past Medical History  Diagnosis Date  . Sickle cell trait   . Vitamin D deficiency 2009  . Migraine headache   . Herniated disc   . Breast mass 06/2013    bilateral    Current Outpatient Prescriptions  Medication Sig Dispense Refill  . [facility] IBUPROFEN IB PO Take by mouth.      . cyclobenzaprine (FLEXERIL) 5 MG tablet Take 1 tablet (5 mg total) by mouth 3 (three) times daily as needed for muscle spasms.  60 tablet  1  . Dapsone (ACZONE) 5 % topical gel Apply topically 2 (two) times daily.      Marland Kitchen HYDROcodone-acetaminophen (NORCO) 5-325 MG per tablet Take 1-2 tablets by mouth every 4 (four) hours as needed for moderate pain.  30 tablet  0  . Vitamin D, Ergocalciferol, (DRISDOL) 50000 UNITS CAPS capsule TAKE 1 CAPSULE (50,000 UNITS TOTAL) BY MOUTH ONCE A WEEK.  6 capsule  3   No current facility-administered medications for this visit.    Allergies  Allergen Reactions  . Naproxen Other (See Comments)    GI UPSET  . Phentermine Hcl Other (See Comments)    GI UPSET  . Sumatriptan Other (See Comments)    GI UPSET  . Trazodone Hcl Other (See Comments)    GI UPSET    Family History  Problem Relation Age of Onset  . Hypertension Father   . Diabetes Father   . Heart disease Father   . Cancer Father     prostate  . Sickle cell anemia Daughter   . Sickle cell anemia Son     History   Social History  . Marital Status: Married    Spouse Name: N/A    Number of Children: 2  . Years of Education: N/A   Occupational History  . Not on file.   Social History Main Topics  . Smoking status: Never Smoker   . Smokeless tobacco: Never Used  . Alcohol Use: No  . Drug Use: No  . Sexual Activity: Yes    Birth Control/ Protection: Surgical     Comment: BTL    Other Topics Concern  . Not on file   Social History Narrative  . No narrative on file     Constitutional: Pt reports headache. Denies fever, malaise, fatigue, or abrupt weight changes.  HEENT: Denies eye pain, eye redness, ear pain, ringing in the ears, wax buildup, runny nose, nasal congestion, bloody nose, or sore throat. Neurological: Denies dizziness, difficulty with memory, difficulty with speech or problems with balance and coordination.   No other specific complaints in a complete review of systems (except as listed in HPI above).  Objective:   Physical Exam  BP 118/74  Pulse 76  Temp(Src) 98.1 F (36.7 C) (Oral)  Resp 16  Wt 178 lb (80.74 kg) Wt Readings from Last 3 Encounters:  10/07/13 178 lb (80.74 kg)  07/29/13 177 lb (80.287 kg)  07/13/13 177 lb 4 oz (80.4 kg)    General: Appears her stated age, well developed, well nourished in NAD. HEENT: Head: normal shape and size; Eyes: sclera white, no icterus, conjunctiva pink, PERRLA and EOMs intact; Ears: Tm's gray and intact, normal light  reflex; Nose: mucosa pink and moist, septum midline; Throat/Mouth: Teeth present, mucosa pink and moist, no exudate, lesions or ulcerations noted. .  Cardiovascular: Normal rate and rhythm. S1,S2 noted.  No murmur, rubs or gallops noted. No JVD or BLE edema. No carotid bruits noted. Pulmonary/Chest: Normal effort and positive vesicular breath sounds. No respiratory distress. No wheezes, rales or ronchi noted.  Neurological: Alert and oriented. Cranial nerves II-XII intact. Coordination normal. +DTRs bilaterally. LS NT B        Assessment & Plan:

## 2013-10-07 NOTE — Assessment & Plan Note (Signed)
Zolpidem prn 

## 2013-10-07 NOTE — Assessment & Plan Note (Signed)
Continue with current prescription therapy as reflected on the Med list.  

## 2013-10-12 ENCOUNTER — Encounter: Payer: Self-pay | Admitting: Internal Medicine

## 2013-10-16 ENCOUNTER — Ambulatory Visit (HOSPITAL_BASED_OUTPATIENT_CLINIC_OR_DEPARTMENT_OTHER)
Admission: RE | Admit: 2013-10-16 | Discharge: 2013-10-16 | Disposition: A | Discharge status: Home / Self Care | Payer: 59 | Source: Ambulatory Visit | Attending: Physician Assistant | Admitting: Physician Assistant

## 2013-10-16 ENCOUNTER — Encounter: Payer: Self-pay | Admitting: Physician Assistant

## 2013-10-16 ENCOUNTER — Ambulatory Visit (INDEPENDENT_AMBULATORY_CARE_PROVIDER_SITE_OTHER): Payer: 59 | Admitting: Physician Assistant

## 2013-10-16 VITALS — BP 121/72 | HR 73 | Temp 98.7°F | Wt 177.8 lb

## 2013-10-16 DIAGNOSIS — R1031 Right lower quadrant pain: Secondary | ICD-10-CM

## 2013-10-16 DIAGNOSIS — R109 Unspecified abdominal pain: Secondary | ICD-10-CM

## 2013-10-16 LAB — COMPREHENSIVE METABOLIC PANEL
ALBUMIN: 4.1 g/dL (ref 3.5–5.2)
ALT: 11 U/L (ref 0–35)
AST: 12 U/L (ref 0–37)
Alkaline Phosphatase: 41 U/L (ref 39–117)
BILIRUBIN TOTAL: 0.5 mg/dL (ref 0.3–1.2)
BUN: 11 mg/dL (ref 6–23)
CO2: 26 meq/L (ref 19–32)
Calcium: 9.3 mg/dL (ref 8.4–10.5)
Chloride: 104 mEq/L (ref 96–112)
Creatinine, Ser: 0.8 mg/dL (ref 0.4–1.2)
GFR: 102.06 mL/min (ref >60.00)
Glucose, Bld: 76 mg/dL (ref 70–99)
Potassium: 3.3 mEq/L — ABNORMAL LOW (ref 3.5–5.1)
SODIUM: 138 meq/L (ref 135–145)
TOTAL PROTEIN: 6.9 g/dL (ref 6.0–8.3)

## 2013-10-16 LAB — CBC WITH DIFFERENTIAL/PLATELET
Basophils Absolute: 0 10*3/uL (ref 0.0–0.1)
Basophils Relative: 0.5 % (ref 0.0–3.0)
EOS PCT: 0.7 % (ref 0.0–5.0)
Eosinophils Absolute: 0 10*3/uL (ref 0.0–0.7)
HCT: 36.9 % (ref 36.0–46.0)
HEMOGLOBIN: 12.1 g/dL (ref 12.0–15.0)
Lymphocytes Relative: 36.2 % (ref 12.0–46.0)
Lymphs Abs: 1.8 10*3/uL (ref 0.7–4.0)
MCHC: 32.8 g/dL (ref 30.0–36.0)
MCV: 90.2 fl (ref 78.0–100.0)
MONOS PCT: 6.7 % (ref 3.0–12.0)
Monocytes Absolute: 0.3 10*3/uL (ref 0.1–1.0)
Neutro Abs: 2.8 10*3/uL (ref 1.4–7.7)
Neutrophils Relative %: 55.9 % (ref 43.0–77.0)
PLATELETS: 244 10*3/uL (ref 150.0–400.0)
RBC: 4.09 Mil/uL (ref 3.87–5.11)
RDW: 13.9 % (ref 11.5–14.6)
WBC: 5 10*3/uL (ref 4.5–10.5)

## 2013-10-16 LAB — POCT URINALYSIS DIPSTICK
BILIRUBIN UA: NEGATIVE
Blood, UA: NEGATIVE
GLUCOSE UA: NEGATIVE
LEUKOCYTES UA: NEGATIVE
Nitrite, UA: NEGATIVE
Protein, UA: NEGATIVE
SPEC GRAV UA: 1.02
Urobilinogen, UA: 0.2
pH, UA: 6

## 2013-10-16 MED ORDER — IOHEXOL 300 MG/ML  SOLN
100.0000 mL | Freq: Once | INTRAMUSCULAR | Status: AC | PRN
  Administered 2013-10-16: 100 mL via INTRAVENOUS

## 2013-10-16 MED ORDER — TRAMADOL HCL 50 MG PO TABS: 50.0000 mg | ORAL_TABLET | Freq: Three times a day (TID) | ORAL | Status: DC | PRN

## 2013-10-16 NOTE — Progress Notes (Signed)
Pre visit review using our clinic review tool, if applicable. No additional management support is needed unless otherwise documented below in the visit note. 

## 2013-10-16 NOTE — Patient Instructions (Addendum)
Please take tylenol for pain. Tramadol for severe pain.Keep well hydrated.  Please obtain labs.  I will call you with your results.  We will tailor treatment according to your results.  If pain becomes intense or fever, chills, vomiting occur, please proceed directly to the ER.

## 2013-10-16 NOTE — Progress Notes (Signed)
Patient presents to clinic today c/o 2 days of worsening RLQ pain with nausea and anorexia. Patient denies fever, chills, sweats.  Denies emesis.  Denies constipation, diarrhea, tenesmus, melena or hematochezia.  Pain is 8/10.  Patient was more diffuse before localizing to RLQ. Patient denies hx of abdominal surgery.  States pain is sometimes worse after eating.  Denies recent alcohol consumption or hx of gallstone.  Past Medical History  Diagnosis Date  . Sickle cell trait   . Vitamin D deficiency 2009  . Migraine headache   . Herniated disc   . Breast mass 06/2013    bilateral    Current Outpatient Prescriptions on File Prior to Visit  Medication Sig Dispense Refill  . CVS IBUPROFEN IB PO Take by mouth.      . cyclobenzaprine (FLEXERIL) 5 MG tablet Take 1 tablet (5 mg total) by mouth 3 (three) times daily as needed for muscle spasms.  60 tablet  1  . Dapsone (ACZONE) 5 % topical gel Use bid  60 g  3  . HYDROcodone-acetaminophen (NORCO) 5-325 MG per tablet Take 1-2 tablets by mouth every 4 (four) hours as needed for moderate pain.  30 tablet  0  . Vitamin D, Ergocalciferol, (DRISDOL) 50000 UNITS CAPS capsule TAKE 1 CAPSULE (50,000 UNITS TOTAL) BY MOUTH ONCE A WEEK.  6 capsule  3  . zolpidem (AMBIEN) 10 MG tablet Take 0.5-1 tablets (5-10 mg total) by mouth at bedtime as needed for sleep.  90 tablet  1   No current facility-administered medications on file prior to visit.    Allergies  Allergen Reactions  . Naproxen Other (See Comments)    GI UPSET  . Phentermine Hcl Other (See Comments)    GI UPSET  . Sumatriptan Other (See Comments)    GI UPSET  . Trazodone Hcl Other (See Comments)    GI UPSET    Family History  Problem Relation Age of Onset  . Hypertension Father   . Diabetes Father   . Heart disease Father   . Cancer Father     prostate  . Sickle cell anemia Daughter   . Sickle cell anemia Son     History   Social History  . Marital Status: Married    Spouse  Name: N/A    Number of Children: 2  . Years of Education: N/A   Social History Main Topics  . Smoking status: Never Smoker   . Smokeless tobacco: Never Used  . Alcohol Use: No  . Drug Use: No  . Sexual Activity: Yes    Birth Control/ Protection: Surgical     Comment: BTL   Other Topics Concern  . Not on file   Social History Narrative  . No narrative on file    Review of Systems - See HPI.  All other ROS are negative.  BP 121/72  Pulse 73  Temp(Src) 98.7 F (37.1 C) (Oral)  Wt 177 lb 12.8 oz (80.65 kg)  SpO2 100%  Physical Exam  Constitutional: She is oriented to person, place, and time and well-developed, well-nourished, and in no distress.  HENT:  Head: Normocephalic and atraumatic.  Eyes: Conjunctivae are normal. Pupils are equal, round, and reactive to light.  Neck: Neck supple.  Cardiovascular: Normal rate, regular rhythm, normal heart sounds and intact distal pulses.   Pulmonary/Chest: Effort normal and breath sounds normal. No respiratory distress. She has no wheezes. She has no rales. She exhibits no tenderness.  Abdominal: Soft. Normal appearance and bowel  sounds are normal. There is no hepatosplenomegaly. There is tenderness in the right upper quadrant, right lower quadrant and periumbilical area. There is tenderness at McBurney's point. There is no rigidity, no rebound, no guarding and negative Murphy's sign. No hernia.  Neurological: She is alert and oriented to person, place, and time.  Skin: Skin is warm and dry. No rash noted.  Psychiatric: Affect normal.   Recent Results (from the past 2160 hour(s))  VITAMIN B12     Status: None   Collection Time    10/07/13  3:45 PM      Result Value Ref Range   Vitamin B-12 385  211 - 911 pg/mL  TSH     Status: None   Collection Time    10/07/13  3:45 PM      Result Value Ref Range   TSH 1.06  0.35 - 5.50 uIU/mL  HEPATIC FUNCTION PANEL     Status: None   Collection Time    10/07/13  3:45 PM      Result Value  Ref Range   Total Bilirubin 0.6  0.3 - 1.2 mg/dL   Bilirubin, Direct 0.0  0.0 - 0.3 mg/dL   Alkaline Phosphatase 46  39 - 117 U/L   AST 16  0 - 37 U/L   ALT 16  0 - 35 U/L   Total Protein 7.4  6.0 - 8.3 g/dL   Albumin 4.1  3.5 - 5.2 g/dL  BASIC METABOLIC PANEL     Status: None   Collection Time    10/07/13  3:45 PM      Result Value Ref Range   Sodium 138  135 - 145 mEq/L   Potassium 3.9  3.5 - 5.1 mEq/L   Chloride 106  96 - 112 mEq/L   CO2 27  19 - 32 mEq/L   Glucose, Bld 90  70 - 99 mg/dL   BUN 14  6 - 23 mg/dL   Creatinine, Ser 0.8  0.4 - 1.2 mg/dL   Calcium 9.2  8.4 - 40.9 mg/dL   GFR 811.91  >47.82 mL/min  CBC WITH DIFFERENTIAL     Status: None   Collection Time    10/07/13  3:45 PM      Result Value Ref Range   WBC 5.8  4.5 - 10.5 K/uL   RBC 4.21  3.87 - 5.11 Mil/uL   Hemoglobin 12.4  12.0 - 15.0 g/dL   HCT 95.6  21.3 - 08.6 %   MCV 90.8  78.0 - 100.0 fl   MCHC 32.5  30.0 - 36.0 g/dL   RDW 57.8  46.9 - 62.9 %   Platelets 237.0  150.0 - 400.0 K/uL   Neutrophils Relative % 62.0  43.0 - 77.0 %   Lymphocytes Relative 30.1  12.0 - 46.0 %   Monocytes Relative 6.2  3.0 - 12.0 %   Eosinophils Relative 0.7  0.0 - 5.0 %   Basophils Relative 1.0  0.0 - 3.0 %   Neutro Abs 3.6  1.4 - 7.7 K/uL   Lymphs Abs 1.7  0.7 - 4.0 K/uL   Monocytes Absolute 0.4  0.1 - 1.0 K/uL   Eosinophils Absolute 0.0  0.0 - 0.7 K/uL   Basophils Absolute 0.1  0.0 - 0.1 K/uL  URINALYSIS     Status: Abnormal   Collection Time    10/07/13  3:45 PM      Result Value Ref Range   Color, Urine YELLOW  Yellow;Lt. Yellow  APPearance CLEAR  Clear   Specific Gravity, Urine 1.015  1.000-1.030   pH 6.0  5.0 - 8.0   Total Protein, Urine NEGATIVE  Negative   Urine Glucose NEGATIVE  Negative   Ketones, ur TRACE (*) Negative   Bilirubin Urine NEGATIVE  Negative   Hgb urine dipstick NEGATIVE  Negative   Urobilinogen, UA 0.2  0.0 - 1.0   Leukocytes, UA NEGATIVE  Negative   Nitrite NEGATIVE  Negative  POCT  URINALYSIS DIPSTICK     Status: Normal   Collection Time    10/16/13  2:17 PM      Result Value Ref Range   Color, UA yellow     Clarity, UA clear     Glucose, UA Negative     Bilirubin, UA Negative     Ketones, UA Moderate     Spec Grav, UA 1.020     Blood, UA Negative     pH, UA 6.0     Protein, UA Negative     Urobilinogen, UA 0.2     Nitrite, UA Negative     Leukocytes, UA Negative      Assessment/Plan: Pain, abdominal, RLQ Concern for appendicitis giving diffuse pain now localized to RLQ.  Also must consider pelvic source of pain.  Will obtain CBC, BMP.  Urine dipstick unremarkable.  Urine pregnancy negative.  Order for STAT CT Abdomen/Pelvis W contrast placed.  Patient given Rx for tramadol.  Imaging is to hold patient and call this provider with the results of CT scan. Patient instructed if pain acutely worsens or if new symptoms develop before workup is complete, she is to proceed immediately to ER.   Patient voices understanding.

## 2013-10-16 NOTE — Assessment & Plan Note (Addendum)
Concern for appendicitis giving diffuse pain now localized to RLQ.  Also must consider pelvic source of pain.  Will obtain CBC, BMP.  Urine dipstick unremarkable.  Urine pregnancy negative.  Order for STAT CT Abdomen/Pelvis W contrast placed.  Patient given Rx for tramadol.  Imaging is to hold patient and call this provider with the results of CT scan. Patient instructed if pain acutely worsens or if new symptoms develop before workup is complete, she is to proceed immediately to ER.   Patient voices understanding.

## 2013-12-03 ENCOUNTER — Other Ambulatory Visit: Payer: Self-pay | Admitting: Internal Medicine

## 2013-12-16 ENCOUNTER — Other Ambulatory Visit: Payer: Self-pay | Admitting: Internal Medicine

## 2014-02-03 ENCOUNTER — Encounter: Payer: Self-pay | Admitting: Internal Medicine

## 2014-02-03 ENCOUNTER — Ambulatory Visit (INDEPENDENT_AMBULATORY_CARE_PROVIDER_SITE_OTHER): Payer: 59 | Admitting: Internal Medicine

## 2014-02-03 VITALS — BP 110/58 | HR 80 | Temp 98.0°F | Resp 16 | Wt 183.0 lb

## 2014-02-03 DIAGNOSIS — M545 Low back pain, unspecified: Secondary | ICD-10-CM

## 2014-02-03 DIAGNOSIS — G43009 Migraine without aura, not intractable, without status migrainosus: Secondary | ICD-10-CM

## 2014-02-03 DIAGNOSIS — E559 Vitamin D deficiency, unspecified: Secondary | ICD-10-CM

## 2014-02-03 MED ORDER — RANITIDINE HCL 150 MG PO TABS: 150.0000 mg | ORAL_TABLET | Freq: Two times a day (BID) | ORAL | Status: DC

## 2014-02-03 MED ORDER — DICLOFENAC SODIUM 75 MG PO TBEC: 75.0000 mg | DELAYED_RELEASE_TABLET | Freq: Two times a day (BID) | ORAL | Status: DC | PRN

## 2014-02-03 NOTE — Assessment & Plan Note (Signed)
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 No lumbar spinal  stenosis or convincing right side neural impingement.  Electronically Signed  By: Augusto GambleLee Hall M.D.  On: 08/03/2013 10:26

## 2014-02-03 NOTE — Assessment & Plan Note (Signed)
Continue with current prescription therapy as reflected on the Med list.  

## 2014-02-03 NOTE — Patient Instructions (Signed)
Memory foam mattress pillow-top Try restorative yoga for your back

## 2014-02-03 NOTE — Progress Notes (Signed)
Subjective:    HPI F/u LBP - she had an MRI and saw a sports med specialist  Pt presents to the clinic today to f/u on headaches - better on Topamax.  She does have a history of migraines.   Review of Systems      Past Medical History  Diagnosis Date  . Sickle cell trait   . Vitamin D deficiency 2009  . Migraine headache   . Herniated disc   . Breast mass 06/2013    bilateral    Current Outpatient Prescriptions  Medication Sig Dispense Refill  . [facility]S IBUPROFEN IB PO Take by mouth.      . cyclobenzaprine (FLEXERIL) 5 MG tablet Take 1 tablet (5 mg total) by mouth 3 (three) times daily as needed for muscle spasms.  60 tablet  1  . Dapsone (ACZONE) 5 % topical gel Use bid  60 g  3  . HYDROcodone-acetaminophen (NORCO) 5-325 MG per tablet Take 1-2 tablets by mouth every 4 (four) hours as needed for moderate pain.  30 tablet  0  . traMADol (ULTRAM) 50 MG tablet Take 1 tablet (50 mg total) by mouth every 8 (eight) hours as needed.  30 tablet  0  . Vitamin D, Ergocalciferol, (DRISDOL) 50000 UNITS CAPS capsule TAKE 1 CAPSULE (50,000 UNITS TOTAL) BY MOUTH ONCE A WEEK.  6 capsule  3  . metroNIDAZOLE (FLAGYL) 500 MG tablet TAKE 1 TABLET BY MOUTH THREE TIMES DAILY  21 tablet  00  . zolpidem (AMBIEN) 10 MG tablet Take 0.5-1 tablets (5-10 mg total) by mouth at bedtime as needed for sleep.  90 tablet  1   No current facility-administered medications for this visit.    Allergies  Allergen Reactions  . Naproxen Other (See Comments)    GI UPSET  . Phentermine Hcl Other (See Comments)    GI UPSET  . Sumatriptan Other (See Comments)    GI UPSET  . Trazodone Hcl Other (See Comments)    GI UPSET    Family History  Problem Relation Age of Onset  . Hypertension Father   . Diabetes Father   . Heart disease Father   . Cancer Father     prostate  . Sickle cell anemia Daughter   . Sickle cell anemia Son     History   Social History  . Marital Status: Married    Spouse Name: N/A    Number of Children: 2  . Years of Education: N/A   Occupational History  . Not on file.   Social History Main Topics  . Smoking status: Never Smoker   . Smokeless tobacco: Never Used  . Alcohol Use: No  . Drug Use: No  . Sexual Activity: Yes    Birth Control/ Protection: Surgical     Comment: BTL   Other Topics Concern  . Not on file   Social History Narrative  . No narrative on file     Constitutional: Pt reports headache. Denies fever, malaise, fatigue, or abrupt weight changes.  HEENT: Denies eye pain, eye redness, ear pain, ringing in the ears, wax buildup, runny nose, nasal congestion, bloody nose, or sore throat. Neurological: Denies dizziness, difficulty with memory, difficulty with speech or problems with balance and coordination.   No other specific complaints in a complete review of systems (except as listed in HPI above).  Objective:   Physical Exam  BP 110/58  Pulse 80  Temp(Src) 98 F (36.7 C) (Oral)  Resp 16  Wt  183 lb (83.008 kg) Wt Readings from Last 3 Encounters:  02/03/14 183 lb (83.008 kg)  10/16/13 177 lb 12.8 oz (80.65 kg)  10/07/13 178 lb (80.74 kg)    General: Appears her stated age, well developed, well nourished in NAD. HEENT: Head: normal shape and size; Eyes: sclera white, no icterus, conjunctiva pink, PERRLA and EOMs intact; Ears: Tm's gray and intact, normal light reflex; Nose: mucosa pink and moist, septum midline; Throat/Mouth: Teeth present, mucosa pink and moist, no exudate, lesions or ulcerations noted. .  Cardiovascular: Normal rate and rhythm. S1,S2 noted.  No murmur, rubs or gallops noted. No JVD or BLE edema. No carotid bruits noted. Pulmonary/Chest: Normal effort and positive vesicular breath sounds. No respiratory distress. No wheezes, rales or ronchi noted.  Neurological: Alert and oriented. Cranial nerves II-XII intact. Coordination normal. +DTRs bilaterally. LS NT B        Assessment & Plan:

## 2014-02-03 NOTE — Assessment & Plan Note (Signed)
Continue with current prn prescription therapy as reflected on the Med list.  

## 2014-02-03 NOTE — Progress Notes (Signed)
Pre visit review using our clinic review tool, if applicable. No additional management support is needed unless otherwise documented below in the visit note. 

## 2014-03-04 ENCOUNTER — Telehealth: Payer: Self-pay | Admitting: Internal Medicine

## 2014-03-04 MED ORDER — HYDROCORTISONE ACETATE 25 MG RE SUPP: 25.0000 mg | Freq: Two times a day (BID) | RECTAL | Status: DC

## 2014-03-04 MED ORDER — POLYETHYLENE GLYCOL 3350 17 GM/SCOOP PO POWD: 17.0000 g | Freq: Two times a day (BID) | ORAL | Status: DC | PRN

## 2014-03-04 NOTE — Telephone Encounter (Signed)
Called pt back she stated that 3 weeks ago she had a flare up of hemorrhoids lasted about a week. She was using Preparation H for the last 2 weeks she is having a hard time with constipation. Requesting md advisement on what she can take...Angel [name]/lmb

## 2014-03-04 NOTE — Telephone Encounter (Signed)
Patient wants to speak with assistant about change she is having with her bowels

## 2014-03-04 NOTE — Telephone Encounter (Signed)
Pick up Rx's - Anusol HC supp and Miralax (emailed) AP

## 2014-03-05 NOTE — Telephone Encounter (Signed)
Notified pt with md response.../lmb 

## 2014-03-24 ENCOUNTER — Other Ambulatory Visit: Payer: Self-pay | Admitting: Internal Medicine

## 2014-04-20 ENCOUNTER — Ambulatory Visit (INDEPENDENT_AMBULATORY_CARE_PROVIDER_SITE_OTHER): Payer: 59

## 2014-04-20 DIAGNOSIS — Z23 Encounter for immunization: Secondary | ICD-10-CM

## 2014-05-18 ENCOUNTER — Other Ambulatory Visit: Payer: Self-pay | Admitting: Internal Medicine

## 2014-06-08 ENCOUNTER — Encounter: Payer: Self-pay | Admitting: Internal Medicine

## 2014-06-08 ENCOUNTER — Ambulatory Visit (INDEPENDENT_AMBULATORY_CARE_PROVIDER_SITE_OTHER): Payer: 59 | Admitting: Internal Medicine

## 2014-06-08 VITALS — BP 110/62 | HR 70 | Temp 98.6°F | Wt 185.0 lb

## 2014-06-08 DIAGNOSIS — L02229 Furuncle of trunk, unspecified: Secondary | ICD-10-CM

## 2014-06-08 DIAGNOSIS — J019 Acute sinusitis, unspecified: Secondary | ICD-10-CM | POA: Insufficient documentation

## 2014-06-08 DIAGNOSIS — J0100 Acute maxillary sinusitis, unspecified: Secondary | ICD-10-CM

## 2014-06-08 MED ORDER — AMOXICILLIN-POT CLAVULANATE 875-125 MG PO TABS: 1.0000 | ORAL_TABLET | Freq: Two times a day (BID) | ORAL | Status: DC

## 2014-06-08 MED ORDER — FLUCONAZOLE 150 MG PO TABS: 150.0000 mg | ORAL_TABLET | Freq: Once | ORAL | Status: DC

## 2014-06-08 MED ORDER — PROMETHAZINE-CODEINE 6.25-10 MG/5ML PO SYRP: 5.0000 mL | ORAL_SOLUTION | ORAL | Status: DC | PRN

## 2014-06-08 NOTE — Progress Notes (Signed)
Subjective:    HPI  C/o  Boil in R groin x 1 d C/o URI sx's x 1 week - coughing F/u LBP - she had an MRI and saw a sports med specialist  Pt presents to the clinic today to f/u on headaches - better on Topamax.  She does have a history of migraines.   Review of Systems      Past Medical History  Diagnosis Date  . Sickle cell trait   . Vitamin D deficiency 2009  . Migraine headache   . Herniated disc   . Breast mass 06/2013    bilateral    Current Outpatient Prescriptions  Medication Sig Dispense Refill  . [facility]S IBUPROFEN IB PO Take by mouth.      . cyclobenzaprine (FLEXERIL) 5 MG tablet TAKE 1 TABLET (5 MG TOTAL) BY MOUTH 3 (THREE) TIMES DAILY AS NEEDED FOR MUSCLE SPASMS.  60 tablet  1  . Dapsone (ACZONE) 5 % topical gel Use bid  60 g  3  . diclofenac (VOLTAREN) 75 MG EC tablet Take 1 tablet (75 mg total) by mouth 2 (two) times daily as needed.  60 tablet  3  . hydrocortisone (ANUSOL-HC) 25 MG suppository Place 1 suppository (25 mg total) rectally 2 (two) times daily.  20 suppository  1  . polyethylene glycol powder (GLYCOLAX/MIRALAX) powder Take 17 g by mouth 2 (two) times daily as needed for mild constipation or moderate constipation.  500 g  1  . ranitidine (ZANTAC) 150 MG tablet Take 1 tablet (150 mg total) by mouth 2 (two) times daily.  60 tablet  11  . Vitamin D, Ergocalciferol, (DRISDOL) 50000 UNITS CAPS capsule TAKE 1 CAPSULE (50,000 UNITS TOTAL) BY MOUTH ONCE A WEEK.  6 capsule  2  . zolpidem (AMBIEN) 10 MG tablet Take 0.5-1 tablets (5-10 mg total) by mouth at bedtime as needed for sleep.  90 tablet  1   No current facility-administered medications for this visit.    Allergies  Allergen Reactions  . Naproxen Other (See Comments)    GI UPSET  . Phentermine Hcl Other (See Comments)    GI UPSET  . Sumatriptan Other (See Comments)    GI UPSET  . Trazodone Hcl Other (See Comments)    GI UPSET    Family History  Problem Relation Age of Onset  . Hypertension  Father   . Diabetes Father   . Heart disease Father   . Cancer Father     prostate  . Sickle cell anemia Daughter   . Sickle cell anemia Son     History   Social History  . Marital Status: Married    Spouse Name: N/A    Number of Children: 2  . Years of Education: N/A   Occupational History  . Not on file.   Social History Main Topics  . Smoking status: Never Smoker   . Smokeless tobacco: Never Used  . Alcohol Use: No  . Drug Use: No  . Sexual Activity: Yes    Birth Control/ Protection: Surgical     Comment: BTL   Other Topics Concern  . Not on file   Social History Narrative  . No narrative on file     Constitutional: Pt reports headache. Denies fever, malaise, fatigue, or abrupt weight changes.  HEENT: Denies eye pain, eye redness, ear pain, ringing in the ears, wax buildup, runny nose, nasal congestion, bloody nose, or sore throat. Neurological: Denies dizziness, difficulty with memory, difficulty with  speech or problems with balance and coordination.   No other specific complaints in a complete review of systems (except as listed in HPI above).  Objective:   Physical Exam  BP 110/62  Pulse 70  Temp(Src) 98.6 F (37 C) (Oral)  Wt 185 lb (83.915 kg)  SpO2 99% Wt Readings from Last 3 Encounters:  06/08/14 185 lb (83.915 kg)  02/03/14 183 lb (83.008 kg)  10/16/13 177 lb 12.8 oz (80.65 kg)    General: Appears her stated age, well developed, well nourished in NAD. HEENT: Head: normal shape and size; Eyes: sclera white, no icterus, conjunctiva pink, PERRLA and EOMs intact; Ears: Tm's gray and intact, normal light reflex; Nose: mucosa pink and moist, septum midline; Throat/Mouth: Teeth present, mucosa eryth, no exudate, lesions or ulcerations noted. .  Cardiovascular: Normal rate and rhythm. S1,S2 noted.  No murmur, rubs or gallops noted. No JVD or BLE edema. No carotid bruits noted. Pulmonary/Chest: Normal effort and positive vesicular breath sounds. No  respiratory distress. No wheezes, rales or ronchi noted.  Neurological: Alert and oriented. Cranial nerves II-XII intact. Coordination normal. +DTRs bilaterally. LS NT B  5 mm boil R groin       Assessment & Plan:

## 2014-06-08 NOTE — Assessment & Plan Note (Signed)
Augmentin po Prom-cod syr Rx

## 2014-06-08 NOTE — Assessment & Plan Note (Signed)
R groin 10/15 Augmentin RTC if not better

## 2014-06-08 NOTE — Progress Notes (Signed)
Pre visit review using our clinic review tool, if applicable. No additional management support is needed unless otherwise documented below in the visit note. 

## 2014-06-14 ENCOUNTER — Encounter: Payer: Self-pay | Admitting: Internal Medicine

## 2014-07-19 ENCOUNTER — Other Ambulatory Visit: Payer: Self-pay | Admitting: Internal Medicine

## 2014-08-13 DIAGNOSIS — K219 Gastro-esophageal reflux disease without esophagitis: Secondary | ICD-10-CM

## 2014-08-13 HISTORY — DX: Gastro-esophageal reflux disease without esophagitis: K21.9

## 2014-08-31 ENCOUNTER — Telehealth: Payer: Self-pay | Admitting: *Deleted

## 2014-08-31 NOTE — Telephone Encounter (Signed)
[facility] - Client TELEPHONE ADVICE RECORD [facility] Patient Name: Angel [name] Gender: Female DOB: 1976-06-24 Age: 39 Y 4 M Return Phone Number: [phone] (Primary) Address: [address]State[place]Zip: [place] [address] Client [facility] - Client Client Site [facility] Night Physician Plotnikov, Alex Contact Type Call Call Type Triage / Clinical Relationship To Patient Self Return Phone Number [phone] (Primary) Chief Complaint Headache Initial Comment Caller says she has a really bad migraine PreDisposition InappropriateToAsk Nurse Assessment Nurse: Chrys RacerKoenig, RN, Alexia FreestoneAnna Marie Date/Time Lamount Cohen(Eastern Time): 08/28/2014 10:12:35 AM Confirm and document reason for call. If symptomatic, describe symptoms. ---Caller says she has a really bad migraine . States she hasn't had a migraine for 2 years. . Photophobia, loud noises. Nauseous, no vomiting Has the patient traveled out of the country within the last 30 days? ---No Does the patient require triage? ---Yes Related visit to physician within the last 2 weeks? ---No Does the PT have any chronic conditions? (i.e. diabetes, asthma, etc.) ---Yes List chronic conditions. ---migraines, Did the patient indicate they were pregnant? ---No Guidelines Guideline Title Affirmed Question Affirmed Notes Nurse Date/Time (Eastern Time) Headache [1] SEVERE headache (e.g., excruciating) AND [2] not improved after 2 hours of pain medicine Chrys RacerKoenig, RN, Alexia Freestonenna Marie 08/28/2014 10:16:13 AM Disp. Time Lamount Cohen(Eastern Time) Disposition Final User 08/28/2014 10:21:30 AM See Physician within 4 Hours (or PCP triage) Yes Chrys RacerKoenig, RN, Alexia FreestoneAnna Marie Caller Understands: Yes Disagree/Comply: Comply PLEASE NOTE: All timestamps contained within this report are represented as [place] Standard Time. CONFIDENTIALTY NOTICE: This fax transmission is intended only for the addressee. It  contains information that is legally privileged, confidential or otherwise protected from use or disclosure. If you are not the intended recipient, you are strictly prohibited from reviewing, disclosing, copying using or disseminating any of this information or taking any action in reliance on or regarding this information. If you have received this fax in error, please notify us immediately by telephone so that we can arrange for its return to us. Phone: [phone], Toll-Free: [phone], Fax: [phone] Page: 2 of 2 Call Id: [id number] Care Advice Given Per Guideline SEE PHYSICIAN WITHIN 4 HOURS (or PCP triage): PAIN MEDICINES: IBUPROFEN (E.G., MOTRIN, ADVIL): * Take 400 mg (two 200 mg pills) by mouth every 6 hours as needed. CAUTION - NSAIDS (E.G., IBUPROFEN, NAPROXEN): * GASTROINTESTINAL RISK: There is an increased risk of stomach ulcers, GI bleeding, perforation. DRIVING: Another adult should drive. CALL BACK IF: * You become worse. CARE ADVICE given per Headache (Adult) guideline. * You may take this medicine with or without food. Taking it with food or milk may lessen the chance the drug will upset your stomach. After Care Instructions Given Call Event Type User Date / Time Description Referrals GO TO FACILITY UNDECIDED

## 2014-09-15 ENCOUNTER — Encounter: Payer: Self-pay | Admitting: Internal Medicine

## 2014-09-15 ENCOUNTER — Ambulatory Visit (INDEPENDENT_AMBULATORY_CARE_PROVIDER_SITE_OTHER): Payer: 59 | Admitting: Internal Medicine

## 2014-09-15 ENCOUNTER — Other Ambulatory Visit (INDEPENDENT_AMBULATORY_CARE_PROVIDER_SITE_OTHER): Payer: 59

## 2014-09-15 VITALS — BP 122/64 | HR 80 | Temp 98.5°F | Resp 16 | Ht 66.0 in | Wt 189.0 lb

## 2014-09-15 DIAGNOSIS — G43011 Migraine without aura, intractable, with status migrainosus: Secondary | ICD-10-CM

## 2014-09-15 LAB — COMPREHENSIVE METABOLIC PANEL
ALK PHOS: 46 U/L (ref 39–117)
ALT: 14 U/L (ref 0–35)
AST: 14 U/L (ref 0–37)
Albumin: 4.1 g/dL (ref 3.5–5.2)
BUN: 15 mg/dL (ref 6–23)
CO2: 28 meq/L (ref 19–32)
Calcium: 9.3 mg/dL (ref 8.4–10.5)
Chloride: 106 mEq/L (ref 96–112)
Creatinine, Ser: 0.91 mg/dL (ref 0.40–1.20)
GFR: 88.79 mL/min (ref >60.00)
Glucose, Bld: 116 mg/dL — ABNORMAL HIGH (ref 70–99)
POTASSIUM: 3.9 meq/L (ref 3.5–5.1)
Sodium: 138 mEq/L (ref 135–145)
Total Bilirubin: 0.4 mg/dL (ref 0.2–1.2)
Total Protein: 6.9 g/dL (ref 6.0–8.3)

## 2014-09-15 LAB — CBC WITH DIFFERENTIAL/PLATELET
BASOS ABS: 0.1 10*3/uL (ref 0.0–0.1)
BASOS PCT: 1 % (ref 0.0–3.0)
Eosinophils Absolute: 0.1 10*3/uL (ref 0.0–0.7)
Eosinophils Relative: 1.5 % (ref 0.0–5.0)
HCT: 36.9 % (ref 36.0–46.0)
Hemoglobin: 12.5 g/dL (ref 12.0–15.0)
LYMPHS ABS: 1.4 10*3/uL (ref 0.7–4.0)
Lymphocytes Relative: 23.7 % (ref 12.0–46.0)
MCHC: 33.8 g/dL (ref 30.0–36.0)
MCV: 86.5 fl (ref 78.0–100.0)
MONOS PCT: 6.6 % (ref 3.0–12.0)
Monocytes Absolute: 0.4 10*3/uL (ref 0.1–1.0)
Neutro Abs: 3.8 10*3/uL (ref 1.4–7.7)
Neutrophils Relative %: 67.2 % (ref 43.0–77.0)
Platelets: 212 10*3/uL (ref 150.0–400.0)
RBC: 4.26 Mil/uL (ref 3.87–5.11)
RDW: 13.6 % (ref 11.5–15.5)
WBC: 5.7 10*3/uL (ref 4.0–10.5)

## 2014-09-15 LAB — SEDIMENTATION RATE: SED RATE: 18 mm/h (ref 0–22)

## 2014-09-15 LAB — TSH: TSH: 1.61 u[IU]/mL (ref 0.35–4.50)

## 2014-09-15 MED ORDER — SUMATRIPTAN-NAPROXEN SODIUM 85-500 MG PO TABS: 1.0000 | ORAL_TABLET | ORAL | Status: DC | PRN

## 2014-09-15 MED ORDER — PROCHLORPERAZINE MALEATE 10 MG PO TABS: 10.0000 mg | ORAL_TABLET | Freq: Four times a day (QID) | ORAL | Status: DC | PRN

## 2014-09-15 NOTE — Patient Instructions (Signed)

## 2014-09-15 NOTE — Progress Notes (Signed)
Subjective:    Patient ID: Angel [name], female    DOB: [birth year], 39 y.o.   MRN: [id number]  Headache  This is a recurrent problem. The current episode started 1 to 4 weeks ago. The problem occurs intermittently. The problem has been unchanged. The pain is located in the right unilateral, parietal and retro-orbital region. The pain radiates to the right neck. The pain quality is similar to prior headaches. The quality of the pain is described as stabbing, sharp and shooting. The pain is at a severity of 4/10. The pain is moderate. Associated symptoms include nausea, phonophobia and photophobia. Pertinent negatives include no abdominal pain, abnormal behavior, anorexia, back pain, blurred vision, coughing, dizziness, drainage, ear pain, eye pain, eye redness, eye watering, facial sweating, fever, hearing loss, insomnia, loss of balance, muscle aches, neck pain, numbness, rhinorrhea, scalp tenderness, seizures, sinus pressure, sore throat, swollen glands, tingling, tinnitus, visual change, vomiting, weakness or weight loss. The symptoms are aggravated by emotional stress. She has tried NSAIDs for the symptoms. The treatment provided mild relief. Her past medical history is significant for migraine headaches and migraines in the family. There is no history of cancer, cluster headaches, hypertension, immunosuppression, obesity, pseudotumor cerebri, recent head traumas, sinus disease or TMJ.      Review of Systems  Constitutional: Negative.  Negative for fever, chills, weight loss, diaphoresis, appetite change and fatigue.  HENT: Negative for ear pain, hearing loss, rhinorrhea, sinus pressure, sore throat and tinnitus.   Eyes: Positive for photophobia. Negative for blurred vision, pain and redness.  Respiratory: Negative.  Negative for cough, choking, chest tightness and stridor.   Cardiovascular: Negative.  Negative for chest pain, palpitations and leg swelling.  Gastrointestinal: Positive for  nausea. Negative for vomiting, abdominal pain, diarrhea, constipation and anorexia.  Endocrine: Negative.   Genitourinary: Negative.   Musculoskeletal: Negative.  Negative for myalgias, back pain and neck pain.  Skin: Negative.  Negative for rash.  Allergic/Immunologic: Negative.   Neurological: Positive for headaches. Negative for dizziness, tingling, seizures, weakness, numbness and loss of balance.  Hematological: Negative.  Negative for adenopathy. Does not bruise/bleed easily.  Psychiatric/Behavioral: Negative.  The patient does not have insomnia.        Objective:   Physical Exam  Constitutional: She is oriented to person, place, and time. She appears well-developed and well-nourished. No distress.  HENT:  Head: Normocephalic and atraumatic.  Mouth/Throat: Oropharynx is clear and moist. No oropharyngeal exudate.  Eyes: Conjunctivae and EOM are normal. Pupils are equal, round, and reactive to light. Right eye exhibits no discharge. Left eye exhibits no discharge. No scleral icterus.  Neck: Normal range of motion. Neck supple. No JVD present. No tracheal deviation present. No thyromegaly present.  Cardiovascular: Normal rate, regular rhythm, normal heart sounds and intact distal pulses.  Exam reveals no gallop and no friction rub.   No murmur heard. Pulmonary/Chest: Effort normal and breath sounds normal. No stridor. No respiratory distress. She has no wheezes. She has no rales. She exhibits no tenderness.  Abdominal: Soft. Bowel sounds are normal. She exhibits no distension and no mass. There is no tenderness. There is no rebound and no guarding.  Musculoskeletal: Normal range of motion. She exhibits no edema or tenderness.  Lymphadenopathy:    She has no cervical adenopathy.  Neurological: She is alert and oriented to person, place, and time. She has normal reflexes. She displays normal reflexes. No cranial nerve deficit. She exhibits normal muscle tone. Coordination normal.  Skin:  Skin  is warm and dry. No rash noted. She is not diaphoretic. No erythema. No pallor.  Psychiatric: She has a normal mood and affect. Her behavior is normal. Judgment and thought content normal.     Lab Results  Component Value Date   WBC 5.0 10/16/2013   HGB 12.1 10/16/2013   HCT 36.9 10/16/2013   PLT 244.0 10/16/2013   GLUCOSE 76 10/16/2013   CHOL 171 09/22/2012   TRIG 37.0 09/22/2012   HDL 73.50 09/22/2012   LDLCALC 90 09/22/2012   ALT 11 10/16/2013   AST 12 10/16/2013   NA 138 10/16/2013   K 3.3* 10/16/2013   CL 104 10/16/2013   CREATININE 0.8 10/16/2013   BUN 11 10/16/2013   CO2 26 10/16/2013   TSH 1.06 10/07/2013       Assessment & Plan:

## 2014-09-15 NOTE — Progress Notes (Signed)
Pre visit review using our clinic review tool, if applicable. No additional management support is needed unless otherwise documented below in the visit note. 

## 2014-09-16 ENCOUNTER — Other Ambulatory Visit: Payer: Self-pay | Admitting: Internal Medicine

## 2014-09-16 DIAGNOSIS — G43001 Migraine without aura, not intractable, with status migrainosus: Secondary | ICD-10-CM

## 2014-09-16 MED ORDER — SUMATRIPTAN SUCCINATE 100 MG PO TABS: 100.0000 mg | ORAL_TABLET | ORAL | Status: DC | PRN

## 2014-09-20 ENCOUNTER — Encounter: Payer: Self-pay | Admitting: Internal Medicine

## 2014-09-20 NOTE — Assessment & Plan Note (Signed)
Her exam is nromal, labs are normal so I don't think there is a metabolic or inflammatory cause for the HA Will get an MRI to see if there is a structural cause for the pain like an aneurysm, mass, bleed, etc Will start a triptan and compazine for symptom relief

## 2014-09-22 ENCOUNTER — Ambulatory Visit: Payer: Self-pay | Admitting: Internal Medicine

## 2014-10-06 ENCOUNTER — Telehealth: Payer: Self-pay | Admitting: *Deleted

## 2014-10-06 NOTE — Telephone Encounter (Signed)
Left smg on triage stating been trying to get her migraine med pharmacy stated she need PA have fax several times no response. Called pt no answer LMOM md assistant is not in the office this week, but we have the PA will go ahead and initiate PA. Called [facility] gave representative clinical info. PA was sent to clinica review she stated md & pt will be contact once a decision has been made...Raechel Chute/lmb

## 2014-10-11 NOTE — Telephone Encounter (Signed)
Called to check status on PA spke with Kate/representative she stated PA was denied. Letter was sent to md & pt with final decision....Raechel Chute/lmb

## 2014-10-11 NOTE — Telephone Encounter (Signed)
Fax of denial received.

## 2014-10-18 ENCOUNTER — Ambulatory Visit (INDEPENDENT_AMBULATORY_CARE_PROVIDER_SITE_OTHER): Payer: 59 | Admitting: Internal Medicine

## 2014-10-18 ENCOUNTER — Encounter: Payer: Self-pay | Admitting: Internal Medicine

## 2014-10-18 ENCOUNTER — Other Ambulatory Visit (INDEPENDENT_AMBULATORY_CARE_PROVIDER_SITE_OTHER): Payer: 59

## 2014-10-18 VITALS — BP 114/70 | HR 84 | Temp 98.7°F | Ht 66.0 in | Wt 191.0 lb

## 2014-10-18 DIAGNOSIS — M5440 Lumbago with sciatica, unspecified side: Secondary | ICD-10-CM | POA: Diagnosis not present

## 2014-10-18 DIAGNOSIS — R7309 Other abnormal glucose: Secondary | ICD-10-CM

## 2014-10-18 DIAGNOSIS — Z0000 Encounter for general adult medical examination without abnormal findings: Secondary | ICD-10-CM

## 2014-10-18 DIAGNOSIS — Z23 Encounter for immunization: Secondary | ICD-10-CM

## 2014-10-18 LAB — URINALYSIS, ROUTINE W REFLEX MICROSCOPIC
Bilirubin Urine: NEGATIVE
HGB URINE DIPSTICK: NEGATIVE
Ketones, ur: NEGATIVE
Nitrite: NEGATIVE
RBC / HPF: NONE SEEN (ref 0–?)
SPECIFIC GRAVITY, URINE: 1.01 (ref 1.000–1.030)
Total Protein, Urine: NEGATIVE
URINE GLUCOSE: NEGATIVE
Urobilinogen, UA: 0.2 (ref 0.0–1.0)
pH: 6 (ref 5.0–8.0)

## 2014-10-18 LAB — CBC WITH DIFFERENTIAL/PLATELET
BASOS ABS: 0 10*3/uL (ref 0.0–0.1)
BASOS PCT: 0.8 % (ref 0.0–3.0)
EOS ABS: 0.1 10*3/uL (ref 0.0–0.7)
EOS PCT: 2 % (ref 0.0–5.0)
HCT: 36.4 % (ref 36.0–46.0)
Hemoglobin: 12.4 g/dL (ref 12.0–15.0)
LYMPHS ABS: 1.6 10*3/uL (ref 0.7–4.0)
Lymphocytes Relative: 29.1 % (ref 12.0–46.0)
MCHC: 34.1 g/dL (ref 30.0–36.0)
MCV: 87.1 fl (ref 78.0–100.0)
Monocytes Absolute: 0.3 10*3/uL (ref 0.1–1.0)
Monocytes Relative: 6.2 % (ref 3.0–12.0)
NEUTROS ABS: 3.5 10*3/uL (ref 1.4–7.7)
Neutrophils Relative %: 61.9 % (ref 43.0–77.0)
Platelets: 235 10*3/uL (ref 150.0–400.0)
RBC: 4.17 Mil/uL (ref 3.87–5.11)
RDW: 13.4 % (ref 11.5–15.5)
WBC: 5.7 10*3/uL (ref 4.0–10.5)

## 2014-10-18 LAB — BASIC METABOLIC PANEL
BUN: 14 mg/dL (ref 6–23)
CO2: 31 meq/L (ref 19–32)
Calcium: 9.3 mg/dL (ref 8.4–10.5)
Chloride: 104 mEq/L (ref 96–112)
Creatinine, Ser: 0.9 mg/dL (ref 0.40–1.20)
GFR: 89.89 mL/min (ref >60.00)
GLUCOSE: 100 mg/dL — AB (ref 70–99)
Potassium: 4.1 mEq/L (ref 3.5–5.1)
SODIUM: 137 meq/L (ref 135–145)

## 2014-10-18 LAB — LIPID PANEL
CHOLESTEROL: 171 mg/dL (ref 0–200)
HDL: 67.8 mg/dL (ref >39.00)
LDL Cholesterol: 93 mg/dL (ref 0–99)
NONHDL: 103.2
Total CHOL/HDL Ratio: 3
Triglycerides: 50 mg/dL (ref 0.0–149.0)
VLDL: 10 mg/dL (ref 0.0–40.0)

## 2014-10-18 LAB — HEPATIC FUNCTION PANEL
ALT: 11 U/L (ref 0–35)
AST: 12 U/L (ref 0–37)
Albumin: 4.1 g/dL (ref 3.5–5.2)
Alkaline Phosphatase: 43 U/L (ref 39–117)
BILIRUBIN DIRECT: 0 mg/dL (ref 0.0–0.3)
BILIRUBIN TOTAL: 0.3 mg/dL (ref 0.2–1.2)
Total Protein: 7.1 g/dL (ref 6.0–8.3)

## 2014-10-18 LAB — VITAMIN D 25 HYDROXY (VIT D DEFICIENCY, FRACTURES): VITD: 31.64 ng/mL (ref 30.00–100.00)

## 2014-10-18 LAB — TSH: TSH: 1.66 u[IU]/mL (ref 0.35–4.50)

## 2014-10-18 LAB — HEMOGLOBIN A1C: HEMOGLOBIN A1C: 5.6 % (ref 4.6–6.5)

## 2014-10-18 LAB — VITAMIN B12: Vitamin B-12: 411 pg/mL (ref 211–911)

## 2014-10-18 MED ORDER — TRAMADOL HCL 50 MG PO TABS: 50.0000 mg | ORAL_TABLET | Freq: Two times a day (BID) | ORAL | Status: DC | PRN

## 2014-10-18 MED ORDER — VITAMIN D (ERGOCALCIFEROL) 1.25 MG (50000 UNIT) PO CAPS: ORAL_CAPSULE | ORAL | Status: DC

## 2014-10-18 NOTE — Progress Notes (Signed)
Subjective:   The patient is here for a wellness exam. The patient has been doing well overall without major physical or psychological issues going on lately  .Back Pain This is a chronic problem. The pain is present in the lumbar spine. The pain does not radiate. The pain is severe. The symptoms are aggravated by lying down. Pertinent negatives include no abdominal pain, chest pain, dysuria, fever, numbness or weakness. Risk factors include lack of exercise. The treatment provided no relief.   C/o migraines   C/o heavier periods - first day  Wt Readings from Last 3 Encounters:  10/18/14 191 lb (86.637 kg)  09/15/14 189 lb (85.73 kg)  06/08/14 185 lb (83.915 kg)   BP Readings from Last 3 Encounters:  10/18/14 114/70  09/15/14 122/64  06/08/14 110/62      Review of Systems  Constitutional: Negative for fever, chills, diaphoresis, activity change, appetite change, fatigue and unexpected weight change.  HENT: Negative for congestion, dental problem, ear pain, hearing loss, mouth sores, postnasal drip, sinus pressure, sneezing, sore throat and voice change.   Eyes: Negative for pain and visual disturbance.  Respiratory: Negative for cough, chest tightness, wheezing and stridor.   Cardiovascular: Negative for chest pain, palpitations and leg swelling.  Gastrointestinal: Negative for nausea, vomiting, abdominal pain, blood in stool, abdominal distention and rectal pain.  Genitourinary: Negative for dysuria, hematuria, decreased urine volume, vaginal bleeding, vaginal discharge, difficulty urinating, vaginal pain and menstrual problem.  Musculoskeletal: Positive for back pain. Negative for joint swelling, gait problem and neck pain.  Skin: Negative for color change, pallor, rash and wound.  Neurological: Negative for dizziness, tremors, syncope, speech difficulty, weakness, light-headedness and numbness.  Hematological: Negative for adenopathy.  Psychiatric/Behavioral: Negative for  suicidal ideas, hallucinations, behavioral problems, confusion, sleep disturbance, dysphoric mood and decreased concentration. The patient is nervous/anxious (dtr is to have splenectomy). The patient is not hyperactive.        Objective:   Physical Exam  Constitutional: She appears well-developed. No distress.  Obese  HENT:  Head: Normocephalic.  Right Ear: External ear normal.  Left Ear: External ear normal.  Nose: Nose normal.  Mouth/Throat: Oropharynx is clear and moist.  Eyes: Conjunctivae are normal. Pupils are equal, round, and reactive to light. Right eye exhibits no discharge. Left eye exhibits no discharge.  Neck: Normal range of motion. Neck supple. No JVD present. No tracheal deviation present. No thyromegaly present.  Cardiovascular: Normal rate, regular rhythm and normal heart sounds.   Pulmonary/Chest: No stridor. No respiratory distress. She has no wheezes. She exhibits no tenderness.  Abdominal: Soft. Bowel sounds are normal. She exhibits distension (mild). She exhibits no mass. There is no tenderness. There is no rebound and no guarding.  Musculoskeletal: She exhibits no edema or tenderness.  Lymphadenopathy:    She has no cervical adenopathy.  Neurological: She displays normal reflexes. No cranial nerve deficit. She exhibits normal muscle tone. Coordination normal.  Skin: No rash noted. No erythema.  Psychiatric: She has a normal mood and affect. Her behavior is normal. Judgment and thought content normal.  LS is tender   Lab Results  Component Value Date   WBC 5.7 09/15/2014   HGB 12.5 09/15/2014   HCT 36.9 09/15/2014   PLT 212.0 09/15/2014   GLUCOSE 116* 09/15/2014   CHOL 171 09/22/2012   TRIG 37.0 09/22/2012   HDL 73.50 09/22/2012   LDLCALC 90 09/22/2012   ALT 14 09/15/2014   AST 14 09/15/2014   NA 138 09/15/2014  K 3.9 09/15/2014   CL 106 09/15/2014   CREATININE 0.91 09/15/2014   BUN 15 09/15/2014   CO2 28 09/15/2014   TSH 1.61 09/15/2014          Assessment & Plan:  Patient ID: Angel [name], female   DOB: May 07, 1976, 39 y.o.   MRN: [id number]

## 2014-10-18 NOTE — Patient Instructions (Signed)
Stretch  

## 2014-10-18 NOTE — Progress Notes (Signed)
Pre visit review using our clinic review tool, if applicable. No additional management support is needed unless otherwise documented below in the visit note. 

## 2014-10-18 NOTE — Assessment & Plan Note (Signed)
Tramadol prn  Potential benefits of a long term opioids use as well as potential risks (i.e. addiction risk, apnea etc) and complications (i.e. Somnolence, constipation and others) were explained to the patient and were aknowledged. Stretching

## 2014-10-18 NOTE — Assessment & Plan Note (Signed)
We discussed age appropriate health related issues, including available/recomended screening tests and vaccinations. We discussed a need for adhering to healthy diet and exercise. Labs/EKG were reviewed/ordered. All questions were answered.   

## 2014-10-18 NOTE — Assessment & Plan Note (Signed)
A1c

## 2014-10-20 ENCOUNTER — Encounter: Payer: Self-pay | Admitting: Internal Medicine

## 2014-10-20 ENCOUNTER — Ambulatory Visit
Admission: RE | Admit: 2014-10-20 | Discharge: 2014-10-20 | Disposition: A | Discharge status: Home / Self Care | Payer: 59 | Source: Ambulatory Visit | Attending: Internal Medicine | Admitting: Internal Medicine

## 2014-10-20 DIAGNOSIS — G43011 Migraine without aura, intractable, with status migrainosus: Secondary | ICD-10-CM

## 2014-10-26 DIAGNOSIS — M5459 Other low back pain: Secondary | ICD-10-CM | POA: Insufficient documentation

## 2014-10-26 DIAGNOSIS — M461 Sacroiliitis, not elsewhere classified: Secondary | ICD-10-CM | POA: Insufficient documentation

## 2014-11-12 ENCOUNTER — Other Ambulatory Visit: Payer: Self-pay | Admitting: Internal Medicine

## 2014-11-17 DIAGNOSIS — L249 Irritant contact dermatitis, unspecified cause: Secondary | ICD-10-CM | POA: Insufficient documentation

## 2014-11-17 HISTORY — DX: Irritant contact dermatitis, unspecified cause: L24.9

## 2014-12-08 ENCOUNTER — Encounter: Payer: Self-pay | Admitting: Internal Medicine

## 2014-12-30 ENCOUNTER — Other Ambulatory Visit: Payer: Self-pay | Admitting: Internal Medicine

## 2015-01-03 ENCOUNTER — Other Ambulatory Visit: Payer: Self-pay | Admitting: Obstetrics & Gynecology

## 2015-01-03 DIAGNOSIS — N644 Mastodynia: Secondary | ICD-10-CM

## 2015-01-03 DIAGNOSIS — R2231 Localized swelling, mass and lump, right upper limb: Secondary | ICD-10-CM

## 2015-01-05 ENCOUNTER — Other Ambulatory Visit: Payer: 59

## 2015-01-06 ENCOUNTER — Ambulatory Visit
Admission: RE | Admit: 2015-01-06 | Discharge: 2015-01-06 | Disposition: A | Discharge status: Home / Self Care | Payer: 59 | Source: Ambulatory Visit | Attending: Obstetrics & Gynecology | Admitting: Obstetrics & Gynecology

## 2015-01-06 ENCOUNTER — Other Ambulatory Visit: Payer: Self-pay | Admitting: Obstetrics & Gynecology

## 2015-01-06 DIAGNOSIS — R2231 Localized swelling, mass and lump, right upper limb: Secondary | ICD-10-CM

## 2015-01-06 DIAGNOSIS — N644 Mastodynia: Secondary | ICD-10-CM

## 2015-04-20 ENCOUNTER — Ambulatory Visit: Payer: 59 | Admitting: Internal Medicine

## 2015-05-10 ENCOUNTER — Ambulatory Visit: Payer: 59

## 2015-05-20 ENCOUNTER — Ambulatory Visit (INDEPENDENT_AMBULATORY_CARE_PROVIDER_SITE_OTHER): Payer: Commercial Managed Care - HMO

## 2015-05-20 DIAGNOSIS — Z23 Encounter for immunization: Secondary | ICD-10-CM

## 2015-06-02 ENCOUNTER — Encounter: Payer: Self-pay | Admitting: Internal Medicine

## 2015-06-02 ENCOUNTER — Ambulatory Visit (INDEPENDENT_AMBULATORY_CARE_PROVIDER_SITE_OTHER): Payer: Commercial Managed Care - HMO | Admitting: Internal Medicine

## 2015-06-02 ENCOUNTER — Other Ambulatory Visit: Payer: Self-pay | Admitting: Internal Medicine

## 2015-06-02 ENCOUNTER — Other Ambulatory Visit (INDEPENDENT_AMBULATORY_CARE_PROVIDER_SITE_OTHER): Payer: Commercial Managed Care - HMO

## 2015-06-02 ENCOUNTER — Ambulatory Visit (INDEPENDENT_AMBULATORY_CARE_PROVIDER_SITE_OTHER)
Admission: RE | Admit: 2015-06-02 | Discharge: 2015-06-02 | Disposition: A | Discharge status: Home / Self Care | Payer: Commercial Managed Care - HMO | Source: Ambulatory Visit | Attending: Internal Medicine | Admitting: Internal Medicine

## 2015-06-02 VITALS — BP 110/72 | HR 73 | Temp 97.5°F | Wt 193.0 lb

## 2015-06-02 DIAGNOSIS — R7309 Other abnormal glucose: Secondary | ICD-10-CM

## 2015-06-02 DIAGNOSIS — M545 Low back pain, unspecified: Secondary | ICD-10-CM

## 2015-06-02 DIAGNOSIS — R1031 Right lower quadrant pain: Secondary | ICD-10-CM

## 2015-06-02 DIAGNOSIS — K219 Gastro-esophageal reflux disease without esophagitis: Secondary | ICD-10-CM | POA: Diagnosis not present

## 2015-06-02 DIAGNOSIS — N83201 Unspecified ovarian cyst, right side: Secondary | ICD-10-CM

## 2015-06-02 LAB — CBC WITH DIFFERENTIAL/PLATELET
Basophils Absolute: 0 10*3/uL (ref 0.0–0.1)
Basophils Relative: 0.6 % (ref 0.0–3.0)
EOS PCT: 1 % (ref 0.0–5.0)
Eosinophils Absolute: 0.1 10*3/uL (ref 0.0–0.7)
HEMATOCRIT: 40.9 % (ref 36.0–46.0)
HEMOGLOBIN: 13.5 g/dL (ref 12.0–15.0)
LYMPHS PCT: 30.7 % (ref 12.0–46.0)
Lymphs Abs: 1.5 10*3/uL (ref 0.7–4.0)
MCHC: 32.9 g/dL (ref 30.0–36.0)
MCV: 90.7 fl (ref 78.0–100.0)
MONOS PCT: 8 % (ref 3.0–12.0)
Monocytes Absolute: 0.4 10*3/uL (ref 0.1–1.0)
Neutro Abs: 3 10*3/uL (ref 1.4–7.7)
Neutrophils Relative %: 59.7 % (ref 43.0–77.0)
Platelets: 270 10*3/uL (ref 150.0–400.0)
RBC: 4.51 Mil/uL (ref 3.87–5.11)
RDW: 13.8 % (ref 11.5–15.5)
WBC: 5 10*3/uL (ref 4.0–10.5)

## 2015-06-02 LAB — HEPATIC FUNCTION PANEL
ALBUMIN: 4.1 g/dL (ref 3.5–5.2)
ALT: 16 U/L (ref 0–35)
AST: 16 U/L (ref 0–37)
Alkaline Phosphatase: 48 U/L (ref 39–117)
BILIRUBIN TOTAL: 0.4 mg/dL (ref 0.2–1.2)
Bilirubin, Direct: 0.1 mg/dL (ref 0.0–0.3)
Total Protein: 7.4 g/dL (ref 6.0–8.3)

## 2015-06-02 LAB — URINALYSIS, ROUTINE W REFLEX MICROSCOPIC
Bilirubin Urine: NEGATIVE
HGB URINE DIPSTICK: NEGATIVE
Ketones, ur: NEGATIVE
NITRITE: NEGATIVE
RBC / HPF: NONE SEEN (ref 0–?)
Specific Gravity, Urine: 1.01 (ref 1.000–1.030)
Total Protein, Urine: NEGATIVE
URINE GLUCOSE: NEGATIVE
Urobilinogen, UA: 0.2 (ref 0.0–1.0)
pH: 6 (ref 5.0–8.0)

## 2015-06-02 LAB — BASIC METABOLIC PANEL
BUN: 19 mg/dL (ref 6–23)
CALCIUM: 9.6 mg/dL (ref 8.4–10.5)
CO2: 28 mEq/L (ref 19–32)
Chloride: 105 mEq/L (ref 96–112)
Creatinine, Ser: 0.92 mg/dL (ref 0.40–1.20)
GFR: 87.36 mL/min (ref >60.00)
Glucose, Bld: 91 mg/dL (ref 70–99)
Potassium: 4.2 mEq/L (ref 3.5–5.1)
Sodium: 139 mEq/L (ref 135–145)

## 2015-06-02 LAB — LIPASE: LIPASE: 21 U/L (ref 11.0–59.0)

## 2015-06-02 MED ORDER — PANTOPRAZOLE SODIUM 40 MG PO TBEC: 40.0000 mg | DELAYED_RELEASE_TABLET | Freq: Every day | ORAL | Status: DC

## 2015-06-02 MED ORDER — IOHEXOL 300 MG/ML  SOLN
100.0000 mL | Freq: Once | INTRAMUSCULAR | Status: AC | PRN
  Administered 2015-06-02: 100 mL via INTRAVENOUS

## 2015-06-02 NOTE — Progress Notes (Signed)
Subjective:    Patient ID: Angel [name], female    DOB: 29-Feb-1976, 39 y.o.   MRN: [id number]  HPI  Here with 3 days onset RLQ pain, mod to severe on occasion, constant, may radiate to the right lower back b/c also some discomfort there; no fever but dedreased appetite, some nausea, no vomiting  n Has had mld worsening reflux but seems different and takes generic zantac, but no  dysphagia, bowel change or blood, .but has some bloating.  Denies urinary symptoms such as dysuria, frequency, urgency, flank pain, hematuria or n/v, fever, chills. No personal or FH renal stones.  + hx of ovary cyst, but asympt, found on exam confirmed by us/, then resolved.  Laying down and getting up can aggrevate  Nothing makes better, except ibprofen . No change with eating or other positions. Past Medical History  Diagnosis Date  . Sickle cell trait (HCC)   . Vitamin D deficiency 2009  . Migraine headache   . Herniated disc   . Breast mass 06/2013    bilateral   Past Surgical History  Procedure Laterality Date  . Wisdom tooth extraction    . Tubal ligation  12/27/2005  . Wrist ganglion excision Right 09/06/2003  . Wrist ganglion excision Left 09/04/1999  . De quervain's release Right 09/06/2003  . Breast biopsy Bilateral 07/13/2013    Procedure: BILATERAL EXCISION BREAS MASSES;  Surgeon: Shelly Rubensteinouglas A Blackman, MD;  Location: [facility];  Service: General;  Laterality: Bilateral;    reports that she has never smoked. She has never used smokeless tobacco. She reports that she does not drink alcohol or use illicit drugs. family history includes Cancer in her father; Diabetes in her father; Heart disease in her father; Hypertension in her father; Sickle cell anemia in her daughter and son. Allergies  Allergen Reactions  . Naproxen Other (See Comments)    GI UPSET  . Phentermine Hcl Other (See Comments)    GI UPSET  . Sumatriptan Other (See Comments)    GI UPSET  . Trazodone Hcl Other (See  Comments)    GI UPSET   Current Outpatient Prescriptions on File Prior to Visit  Medication Sig Dispense Refill  . clindamycin (CLINDAGEL) 1 % gel   11  . cyclobenzaprine (FLEXERIL) 5 MG tablet Take 1 tablet (5 mg total) by mouth 3 (three) times daily. 60 tablet 1  . hydrocortisone (ANUSOL-HC) 25 MG suppository Place 1 suppository (25 mg total) rectally 2 (two) times daily. 20 suppository 1  . polyethylene glycol powder (GLYCOLAX/MIRALAX) powder Take 17 g by mouth 2 (two) times daily as needed for mild constipation or moderate constipation. 500 g 1  . prochlorperazine (COMPAZINE) 10 MG tablet Take 1 tablet (10 mg total) by mouth every 6 (six) hours as needed for nausea or vomiting. 30 tablet 1  . ranitidine (ZANTAC) 150 MG tablet Take 1 tablet (150 mg total) by mouth 2 (two) times daily. 60 tablet 11  . spironolactone (ALDACTONE) 50 MG tablet Take 50 mg by mouth daily.  3  . SUMAtriptan (IMITREX) 100 MG tablet Take 1 tablet (100 mg total) by mouth every 2 (two) hours as needed for migraine or headache. May repeat in 2 hours if headache persists or recurs. 10 tablet 2  . tretinoin (RETIN-A) 0.025 % cream   11  . Vitamin D, Ergocalciferol, (DRISDOL) 50000 UNITS CAPS capsule TAKE 1 CAPSULE (50,000 UNITS TOTAL) BY MOUTH ONCE A WEEK. 6 capsule 2  . traMADol (ULTRAM)  50 MG tablet Take 1-2 tablets (50-100 mg total) by mouth 2 (two) times daily as needed for moderate pain or severe pain. (Patient not taking: Reported on 06/02/2015) 100 tablet 3  . Vitamin D, Ergocalciferol, (DRISDOL) 50000 UNITS CAPS capsule TAKE 1 CAPSULE (50,000 UNITS TOTAL) BY MOUTH ONCE A WEEK. (Patient not taking: Reported on 06/02/2015) 6 capsule 3  . zolpidem (AMBIEN) 10 MG tablet Take 0.5-1 tablets (5-10 mg total) by mouth at bedtime as needed for sleep. 90 tablet 1   No current facility-administered medications on file prior to visit.     Review of Systems  Constitutional: Negative for unusual diaphoresis or night  sweats HENT: Negative for ringing in ear or discharge Eyes: Negative for double vision or worsening visual disturbance.  Respiratory: Negative for choking and stridor.   Gastrointestinal: Negative for vomiting or other signifcant bowel change Genitourinary: Negative for hematuria or change in urine volume.  Musculoskeletal: Negative for other MSK pain or swelling Skin: Negative for color change and worsening wound.  Neurological: Negative for tremors and numbness other than noted  Psychiatric/Behavioral: Negative for decreased concentration or agitation other than above       Objective:   Physical Exam  BP 110/72 mmHg  Pulse 73  Temp(Src) 97.5 F (36.4 C)  Wt 193 lb (87.544 kg)  SpO2 98% VS noted,  Constitutional: Pt appears in no significant distress HENT: Head: NCAT.  Right Ear: External ear normal.  Left Ear: External ear normal.  Eyes: . Pupils are equal, round, and reactive to light. Conjunctivae and EOM are normal Neck: Normal range of motion. Neck supple.  Cardiovascular: Normal rate and regular rhythm.   Pulmonary/Chest: Effort normal and breath sounds without rales or wheezing.  Abd:  Soft, NT, ND, + BS except for RLQ tender moderate, no guarding or rebound Neurological: Pt is alert. Not confused , motor grossly intact 5/5, sens/dtr intact Skin: Skin is warm. No rash, no LE edema Psychiatric: Pt behavior is normal. No agitation.     Assessment & Plan:

## 2015-06-02 NOTE — Assessment & Plan Note (Signed)
Cant r/o appendix vs other , ct for similar syjmptoms mar 2015 neg for acute, appears uncomfortable but nontoxic, but will need to r/o appendix - for ct abd/pelvis with CM, also labs including cr, UA and cbc; further tx pending results

## 2015-06-02 NOTE — Patient Instructions (Addendum)
Please take all new medication as prescribed - the Protonix  Please continue all other medications as before, and refills have been done if requested.  Please have the pharmacy call with any other refills you may need.  Please continue your efforts at being more active, low cholesterol diet, and weight control.  Please keep your appointments with your specialists as you may have planned  You will be contacted regarding the referral for: CT scan - to see PCC's now  Please go to the LAB in the Basement (turn left off the elevator) for the tests to be done today  You will be contacted by phone if any changes need to be made immediately.  Otherwise, you will receive a letter about your results with an explanation, but please check with MyChart first.  Please remember to sign up for MyChart if you have not done so, as this will be important to you in the future with finding out test results, communicating by private email, and scheduling acute appointments online when needed.

## 2015-06-02 NOTE — Assessment & Plan Note (Signed)
Asympt,  Lab Results  Component Value Date   HGBA1C 5.6 10/18/2014   stable overall by history and exam, recent data reviewed with pt, and pt to continue medical treatment as before,  to f/u any worsening symptoms or concerns

## 2015-06-02 NOTE — Assessment & Plan Note (Signed)
To add protonix 40 qd,  to f/u any worsening symptoms or concerns

## 2015-06-02 NOTE — Assessment & Plan Note (Addendum)
?   msk vs radiating pain from rlq, no neuro changes -  Mild, ok to cont ibuprofen prn

## 2015-06-02 NOTE — Progress Notes (Signed)
Patient received education resource, including the self-management goal and tool. Patient verbalized understanding. 

## 2015-06-03 ENCOUNTER — Encounter: Payer: Self-pay | Admitting: Internal Medicine

## 2015-06-03 ENCOUNTER — Telehealth: Payer: Self-pay | Admitting: Internal Medicine

## 2015-06-03 NOTE — Telephone Encounter (Signed)
I called pt- she was just requesting a copy of her CT abd/pelvis. She is going to see her GYN Monday. I advised her I can give her report. CT and labs are upfront in envelope for pt to p/u. Pt informed

## 2015-06-03 NOTE — Telephone Encounter (Signed)
Routed to tamara in error

## 2015-06-03 NOTE — Telephone Encounter (Signed)
Please contact patient regarding tests and her referral. Patient became upset with me when i tried to ask questions to assist. i do not know exactly what her question is.

## 2015-06-19 ENCOUNTER — Other Ambulatory Visit: Payer: Self-pay | Admitting: Internal Medicine

## 2015-06-20 ENCOUNTER — Other Ambulatory Visit: Payer: Self-pay | Admitting: *Deleted

## 2015-06-20 MED ORDER — HYDROCORTISONE ACETATE 25 MG RE SUPP: 25.0000 mg | Freq: Two times a day (BID) | RECTAL | Status: DC | PRN

## 2015-06-27 ENCOUNTER — Other Ambulatory Visit: Payer: Self-pay | Admitting: Internal Medicine

## 2015-07-12 ENCOUNTER — Other Ambulatory Visit: Payer: Self-pay | Admitting: Obstetrics and Gynecology

## 2015-07-13 ENCOUNTER — Ambulatory Visit: Payer: Commercial Managed Care - HMO | Admitting: Physician Assistant

## 2015-07-24 ENCOUNTER — Other Ambulatory Visit: Payer: Self-pay | Admitting: Internal Medicine

## 2015-07-29 ENCOUNTER — Ambulatory Visit: Admit: 2015-07-29 | Payer: Commercial Managed Care - HMO | Admitting: Obstetrics and Gynecology

## 2015-07-29 SURGERY — EXCISION, CYST, OVARY, LAPAROSCOPIC
Anesthesia: General | Laterality: Right

## 2015-09-14 ENCOUNTER — Other Ambulatory Visit: Payer: Self-pay | Admitting: Obstetrics and Gynecology

## 2015-09-15 ENCOUNTER — Other Ambulatory Visit (HOSPITAL_COMMUNITY): Payer: Self-pay | Admitting: Obstetrics and Gynecology

## 2015-09-15 NOTE — H&P (Signed)
Angel [name] is a 39 y.o. female  P: 1-1-0-2 presents for laparoscopic right ovarian cystectomy, bilateral salpingectomy and endometrial ablation because of pelvic pain, a right ovarian cyst, and irregular bleeding.  Over the past several years the patient has experienced random pelvic pain described as sharp and shooting and lasting for several hours.  In the past year,  these pain episodes have increased along with post coital pain and occasional pain in rectal area.   Over the past three months the patient experiences pelvic pain most days of the week and describes the pain as achy/sharp without any initiating or alleviating factors.   She denies post coital bleeding, vaginitis symptoms or problems with urination.  She does admit to constipation in recent months. Her menstrual flow lasts for 4 days and is preceded by several days of spotting.  She changes a super tampon 7 times a day and for her cramps (rated 8/10 on a 10 point pain scale) she takes Ibuprofen 800 mg,  relieving them to 5/10.  A pelvic ultrasound in January 2017 showed: uterus: 6.98 x 3.87 x 6.16 cm,  left intramural fibroid: 1.1 x 1.0 x 0.8 cm, normal left ovary and right ovary with a complex cystic structure: 2.0 x 1.5 x 1.9  cm.   The size of the right ovarian cysts showed a slight increase in size from previous imaging in November 2016 when a CA-125 was found to be normal.  A CBC and TSH done in [place] were also normal.    A review of both medical and surgical management options were given to the patient however she has decided to proceed with surgical evaluation of her pelvic pain, removal of the right ovarian cyst,  removal of both tubes and endometrial ablation.  Past Medical History  OB History: G: 2;    P: 1-1-0-2;  SVB 2003 and 2007  GYN History: menarche: 40 YO    LMP: 08/31/2015    Contracepton condoms  The patient denies history of sexually transmitted disease.  Has a history of abnormal PAP smear treated with Cryotherapy;    Last PAP smear: April 2016-normal  Medical History: Vitamin D Deficiency, GERD, Insomnia,  Left Breast Cyst, Lumbar Disc Herniation, Hemorrhoids and Paresthesias   Surgical History:  1996 Cervical Cryotherapy;   2000 De Quervain's Release;  2001 Left Ganglion Cyst Removal;  2005 Right Ganglion Cyst Removal; 2007 Tubal Sterilization;  2014 Left Breast Lumpectomy (benign) Denies problems with anesthesia or history of blood transfusions  Family History: Cardiovascular Disease, Hypertension, Diabetes Mellitus, Alzheimers, Breast, Prostate and Pharygeal Cancer and Sickle Cell Anemia  Social History:   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 clindamycin (CLINDAGEL) 1 % gel Apply 1 application topically daily.   . cyclobenzaprine (FLEXERIL) 5 MG tablet TAKE 1 TABLET BY MOUTH 3 TIMES A DAY (Patient taking differently: TAKE 1 TABLET BY MOUTH 3 TIMES A DAY AS NEEDED FOR MUSCLE SPASMS)  . gabapentin (NEURONTIN) 300 MG capsule Take 300 mg by mouth daily.  . hydrocortisone (ANUSOL-HC) 25 MG suppository Place 1 suppository (25 mg total) rectally 2 (two) times daily as needed for hemorrhoids or itching.  . ibuprofen (ADVIL,MOTRIN) 800 MG tablet Take 800 mg by mouth every 8 (eight) hours as needed for headache, mild pain or moderate pain.   . pantoprazole (PROTONIX) 40 MG tablet Take   1 tablet (40 mg total) by mouth daily.  . polyethylene glycol powder (GLYCOLAX/MIRALAX) powder Take 17 g by mouth 2 (two) times daily as needed for mild constipation or moderate constipation.  . prochlorperazine (COMPAZINE) 10 MG tablet Take 1 tablet (10 mg total) by mouth every 6 (six) hours as needed for nausea or vomiting.  Marland Kitchen spironolactone (ALDACTONE) 50 MG tablet Take 50 mg by mouth daily.  Marland Kitchen tretinoin (RETIN-A) 0.025 % cream Apply 1 application topically at  bedtime.   . Vitamin D, Ergocalciferol, (DRISDOL) 50000 UNITS CAPS capsule TAKE 1 CAPSULE (50,000 UNITS TOTAL) BY MOUTH ONCE A WEEK.   No facility-administered encounter medications on file as of 09/15/2015.    No Known Allergies    Denies sensitivity to peanuts, shellfish, soy, latex or adhesives.  ROS: Admits to constipation, lower back pain and right sciatica;  Denies headache, vision changes, nasal congestion, dysphagia, tinnitus, dizziness, hoarseness, cough,  chest pain, shortness of breath, nausea, vomiting, diarrhea, urinary frequency, urgency  dysuria, hematuria, vaginitis symptoms, pelvic pain, swelling of joints,easy bruising,  myalgias,  skin rashes, unexplained weight loss and except as is mentioned in the history of present illness, patient's review of systems is otherwise negative.   Physical Exam  Bp: 122/62          P: 80    R: 20    Temperature: 98.9 degrees F orally     Weight: 200 lbs.  Height: 5'6"  BMI: 32.3  Neck: supple without masses or thyromegaly Lungs: clear to auscultation Heart: regular rate and rhythm Abdomen: soft, non-tender and no organomegaly Pelvic:EGBUS- wnl; vagina-normal rugae; uterus-normal size, cervix without lesions or motion tenderness; adnexae-mild bilateral tenderness or masses Extremities:  no clubbing, cyanosis or edema   Assesment: Pelvic Pain           Right Ovarian Cyst           Irregular Bleeding   Disposition:  A discussion was held with patient regarding the indication for her procedure(s) along with the risks, which include but are not limited to: reaction to anesthesia, damage to adjacent organs (ureters, bladder, uterus and colon), infection and excessive bleeding.  The patient verbalized understanding of these risks and has consented to proceed with a Laparoscopic Right Ovarian Cystectomy, Bilateral Salpingectomy, Hysteroscopy, Dilatation, Curettage and Endometrial Ablation at [facility] [place] on September 26, 2015  at 10 a.m.   CSN# [id number]   Aalijah Lanphere J. Lowell Guitar, PA-C  for Dr.  Woodroe Mode. Su Hilt

## 2015-09-26 ENCOUNTER — Encounter (HOSPITAL_COMMUNITY)
Admission: RE | Disposition: A | Discharge status: Home / Self Care | Payer: Self-pay | Source: Ambulatory Visit | Attending: Obstetrics and Gynecology

## 2015-09-26 ENCOUNTER — Ambulatory Visit (HOSPITAL_COMMUNITY): Payer: Commercial Managed Care - HMO | Admitting: Anesthesiology

## 2015-09-26 ENCOUNTER — Encounter (HOSPITAL_COMMUNITY): Payer: Self-pay

## 2015-09-26 ENCOUNTER — Ambulatory Visit (HOSPITAL_COMMUNITY)
Admission: RE | Admit: 2015-09-26 | Discharge: 2015-09-26 | Disposition: A | Discharge status: Home / Self Care | Payer: Commercial Managed Care - HMO | Source: Ambulatory Visit | Attending: Obstetrics and Gynecology | Admitting: Obstetrics and Gynecology

## 2015-09-26 DIAGNOSIS — R102 Pelvic and perineal pain: Secondary | ICD-10-CM | POA: Insufficient documentation

## 2015-09-26 DIAGNOSIS — M25551 Pain in right hip: Secondary | ICD-10-CM

## 2015-09-26 DIAGNOSIS — N939 Abnormal uterine and vaginal bleeding, unspecified: Secondary | ICD-10-CM | POA: Diagnosis not present

## 2015-09-26 DIAGNOSIS — N83201 Unspecified ovarian cyst, right side: Secondary | ICD-10-CM | POA: Diagnosis not present

## 2015-09-26 DIAGNOSIS — N920 Excessive and frequent menstruation with regular cycle: Secondary | ICD-10-CM

## 2015-09-26 HISTORY — PX: BILATERAL SALPINGECTOMY: SHX5743

## 2015-09-26 HISTORY — PX: LAPAROSCOPIC OVARIAN CYSTECTOMY: SHX6248

## 2015-09-26 HISTORY — DX: Gastro-esophageal reflux disease without esophagitis: K21.9

## 2015-09-26 HISTORY — PX: DILITATION & CURRETTAGE/HYSTROSCOPY WITH NOVASURE ABLATION: SHX5568

## 2015-09-26 LAB — CBC
HCT: 35.6 % — ABNORMAL LOW (ref 36.0–46.0)
HEMOGLOBIN: 12.1 g/dL (ref 12.0–15.0)
MCH: 29.4 pg (ref 26.0–34.0)
MCHC: 34 g/dL (ref 30.0–36.0)
MCV: 86.4 fL (ref 78.0–100.0)
PLATELETS: 285 10*3/uL (ref 150–400)
RBC: 4.12 MIL/uL (ref 3.87–5.11)
RDW: 14.4 % (ref 11.5–15.5)
WBC: 5.4 10*3/uL (ref 4.0–10.5)

## 2015-09-26 LAB — PREGNANCY, URINE: PREG TEST UR: NEGATIVE

## 2015-09-26 SURGERY — EXCISION, CYST, OVARY, LAPAROSCOPIC
Anesthesia: General | Site: Abdomen | Laterality: Right

## 2015-09-26 MED ORDER — ROCURONIUM BROMIDE 100 MG/10ML IV SOLN
INTRAVENOUS | Status: DC | PRN
  Administered 2015-09-26: 40 mg via INTRAVENOUS

## 2015-09-26 MED ORDER — GLYCOPYRROLATE 0.2 MG/ML IJ SOLN
INTRAMUSCULAR | Status: AC
  Filled 2015-09-26: qty 1

## 2015-09-26 MED ORDER — LACTATED RINGERS IR SOLN
Status: DC | PRN
  Administered 2015-09-26: 3000 mL

## 2015-09-26 MED ORDER — BUPIVACAINE HCL (PF) 0.25 % IJ SOLN
INTRAMUSCULAR | Status: DC | PRN
  Administered 2015-09-26: 16 mL

## 2015-09-26 MED ORDER — PROPOFOL 10 MG/ML IV BOLUS
INTRAVENOUS | Status: AC
  Filled 2015-09-26: qty 20

## 2015-09-26 MED ORDER — NEOSTIGMINE METHYLSULFATE 10 MG/10ML IV SOLN
INTRAVENOUS | Status: AC
  Filled 2015-09-26: qty 1

## 2015-09-26 MED ORDER — FENTANYL CITRATE (PF) 100 MCG/2ML IJ SOLN
INTRAMUSCULAR | Status: DC | PRN
Start: 2015-09-26 — End: 2015-09-26
  Administered 2015-09-26 (×3): 50 ug via INTRAVENOUS
  Administered 2015-09-26: 100 ug via INTRAVENOUS

## 2015-09-26 MED ORDER — LACTATED RINGERS IV SOLN
INTRAVENOUS | Status: DC
  Administered 2015-09-26: 09:00:00 via INTRAVENOUS

## 2015-09-26 MED ORDER — MIDAZOLAM HCL 2 MG/2ML IJ SOLN
INTRAMUSCULAR | Status: DC | PRN
  Administered 2015-09-26: 2 mg via INTRAVENOUS

## 2015-09-26 MED ORDER — GLYCOPYRROLATE 0.2 MG/ML IJ SOLN
INTRAMUSCULAR | Status: AC
  Filled 2015-09-26: qty 2

## 2015-09-26 MED ORDER — LIDOCAINE HCL 1 % IJ SOLN
INTRAMUSCULAR | Status: DC | PRN
  Administered 2015-09-26: 10 mL

## 2015-09-26 MED ORDER — HEPARIN SODIUM (PORCINE) 5000 UNIT/ML IJ SOLN
INTRAMUSCULAR | Status: AC
  Filled 2015-09-26: qty 1

## 2015-09-26 MED ORDER — OXYCODONE-ACETAMINOPHEN 5-325 MG PO TABS: 1.0000 | ORAL_TABLET | Freq: Four times a day (QID) | ORAL | Status: DC | PRN

## 2015-09-26 MED ORDER — IBUPROFEN 600 MG PO TABS: ORAL_TABLET | ORAL | Status: DC

## 2015-09-26 MED ORDER — SCOPOLAMINE 1 MG/3DAYS TD PT72
1.0000 | MEDICATED_PATCH | Freq: Once | TRANSDERMAL | Status: DC
  Administered 2015-09-26: 1.5 mg via TRANSDERMAL

## 2015-09-26 MED ORDER — DEXAMETHASONE SODIUM PHOSPHATE 4 MG/ML IJ SOLN
INTRAMUSCULAR | Status: AC
  Filled 2015-09-26: qty 1

## 2015-09-26 MED ORDER — MIDAZOLAM HCL 2 MG/2ML IJ SOLN
INTRAMUSCULAR | Status: AC
  Filled 2015-09-26: qty 2

## 2015-09-26 MED ORDER — CEFAZOLIN SODIUM-DEXTROSE 2-3 GM-% IV SOLR
2.0000 g | INTRAVENOUS | Status: AC
  Administered 2015-09-26: 2 g via INTRAVENOUS

## 2015-09-26 MED ORDER — FENTANYL CITRATE (PF) 100 MCG/2ML IJ SOLN
INTRAMUSCULAR | Status: AC
  Administered 2015-09-26: 50 ug via INTRAVENOUS
  Filled 2015-09-26: qty 2

## 2015-09-26 MED ORDER — SCOPOLAMINE 1 MG/3DAYS TD PT72
MEDICATED_PATCH | TRANSDERMAL | Status: AC
  Administered 2015-09-26: 1.5 mg via TRANSDERMAL
  Filled 2015-09-26: qty 1

## 2015-09-26 MED ORDER — ONDANSETRON HCL 4 MG/2ML IJ SOLN
INTRAMUSCULAR | Status: AC
  Filled 2015-09-26: qty 2

## 2015-09-26 MED ORDER — KETOROLAC TROMETHAMINE 30 MG/ML IJ SOLN
INTRAMUSCULAR | Status: AC
  Filled 2015-09-26: qty 1

## 2015-09-26 MED ORDER — DEXAMETHASONE SODIUM PHOSPHATE 10 MG/ML IJ SOLN
INTRAMUSCULAR | Status: DC | PRN
  Administered 2015-09-26: 4 mg via INTRAVENOUS

## 2015-09-26 MED ORDER — FENTANYL CITRATE (PF) 100 MCG/2ML IJ SOLN
25.0000 ug | INTRAMUSCULAR | Status: DC | PRN
  Administered 2015-09-26 (×2): 50 ug via INTRAVENOUS

## 2015-09-26 MED ORDER — ACETAMINOPHEN 160 MG/5ML PO SOLN
ORAL | Status: AC
  Administered 2015-09-26: 975 mg via ORAL
  Filled 2015-09-26: qty 40.6

## 2015-09-26 MED ORDER — BUPIVACAINE HCL (PF) 0.25 % IJ SOLN
INTRAMUSCULAR | Status: AC
  Filled 2015-09-26: qty 30

## 2015-09-26 MED ORDER — PROPOFOL 10 MG/ML IV BOLUS
INTRAVENOUS | Status: DC | PRN
  Administered 2015-09-26: 200 mg via INTRAVENOUS

## 2015-09-26 MED ORDER — LIDOCAINE HCL (CARDIAC) 20 MG/ML IV SOLN
INTRAVENOUS | Status: DC | PRN
  Administered 2015-09-26: 60 mg via INTRAVENOUS

## 2015-09-26 MED ORDER — CEFAZOLIN SODIUM-DEXTROSE 2-3 GM-% IV SOLR
INTRAVENOUS | Status: AC
  Filled 2015-09-26: qty 50

## 2015-09-26 MED ORDER — ROCURONIUM BROMIDE 100 MG/10ML IV SOLN
INTRAVENOUS | Status: AC
  Filled 2015-09-26: qty 1

## 2015-09-26 MED ORDER — GLYCOPYRROLATE 0.2 MG/ML IJ SOLN
INTRAMUSCULAR | Status: DC | PRN
  Administered 2015-09-26: 0.2 mg via INTRAVENOUS
  Administered 2015-09-26: 0.4 mg via INTRAVENOUS

## 2015-09-26 MED ORDER — NEOSTIGMINE METHYLSULFATE 10 MG/10ML IV SOLN
INTRAVENOUS | Status: DC | PRN
Start: 2015-09-26 — End: 2015-09-26
  Administered 2015-09-26: 3 mg via INTRAVENOUS

## 2015-09-26 MED ORDER — FENTANYL CITRATE (PF) 250 MCG/5ML IJ SOLN
INTRAMUSCULAR | Status: AC
  Filled 2015-09-26: qty 5

## 2015-09-26 MED ORDER — LIDOCAINE HCL (CARDIAC) 20 MG/ML IV SOLN
INTRAVENOUS | Status: AC
  Filled 2015-09-26: qty 5

## 2015-09-26 MED ORDER — KETOROLAC TROMETHAMINE 30 MG/ML IJ SOLN
INTRAMUSCULAR | Status: DC | PRN
  Administered 2015-09-26: 30 mg via INTRAVENOUS

## 2015-09-26 MED ORDER — ONDANSETRON HCL 4 MG/2ML IJ SOLN
INTRAMUSCULAR | Status: DC | PRN
  Administered 2015-09-26: 4 mg via INTRAVENOUS

## 2015-09-26 MED ORDER — ACETAMINOPHEN 160 MG/5ML PO SOLN
975.0000 mg | Freq: Once | ORAL | Status: AC
  Administered 2015-09-26: 975 mg via ORAL

## 2015-09-26 MED ORDER — LIDOCAINE HCL 1 % IJ SOLN
INTRAMUSCULAR | Status: AC
  Filled 2015-09-26: qty 20

## 2015-09-26 SURGICAL SUPPLY — 41 items
ABLATOR ENDOMETRIAL BIPOLAR (ABLATOR) ×4 IMPLANT
BAG SPEC RTRVL LRG 6X4 10 (ENDOMECHANICALS)
CABLE HIGH FREQUENCY MONO STRZ (ELECTRODE) ×1 IMPLANT
CATH ROBINSON RED A/P 16FR (CATHETERS) ×3 IMPLANT
CHLORAPREP W/TINT 26ML (MISCELLANEOUS) ×4 IMPLANT
CLOTH BEACON ORANGE TIMEOUT ST (SAFETY) ×4 IMPLANT
CONTAINER PREFILL 10% NBF 60ML (FORM) ×11 IMPLANT
DRSG COVADERM PLUS 2X2 (GAUZE/BANDAGES/DRESSINGS) ×8 IMPLANT
DRSG OPSITE POSTOP 3X4 (GAUZE/BANDAGES/DRESSINGS) ×1 IMPLANT
FORCEPS CUTTING 33CM 5MM (CUTTING FORCEPS) ×1 IMPLANT
GLOVE BIO SURGEON STRL SZ7.5 (GLOVE) ×4 IMPLANT
GLOVE BIOGEL PI IND STRL 7.0 (GLOVE) ×3 IMPLANT
GLOVE BIOGEL PI IND STRL 7.5 (GLOVE) ×3 IMPLANT
GLOVE BIOGEL PI INDICATOR 7.0 (GLOVE) ×1
GLOVE BIOGEL PI INDICATOR 7.5 (GLOVE) ×1
GOWN STRL REUS W/TWL LRG LVL3 (GOWN DISPOSABLE) ×8 IMPLANT
LIQUID BAND (GAUZE/BANDAGES/DRESSINGS) ×4 IMPLANT
NEEDLE INSUFFLATION 120MM (ENDOMECHANICALS) ×1 IMPLANT
NS IRRIG 1000ML POUR BTL (IV SOLUTION) ×4 IMPLANT
PACK LAPAROSCOPY BASIN (CUSTOM PROCEDURE TRAY) ×4 IMPLANT
PACK VAGINAL MINOR WOMEN LF (CUSTOM PROCEDURE TRAY) ×4 IMPLANT
PAD OB MATERNITY 4.3X12.25 (PERSONAL CARE ITEMS) ×4 IMPLANT
PAD PREP 24X48 CUFFED NSTRL (MISCELLANEOUS) ×4 IMPLANT
PAD TRENDELENBURG POSITION (MISCELLANEOUS) ×4 IMPLANT
POUCH SPECIMEN RETRIEVAL 10MM (ENDOMECHANICALS) IMPLANT
SET IRRIG TUBING LAPAROSCOPIC (IRRIGATION / IRRIGATOR) IMPLANT
SLEEVE XCEL OPT CAN 5 100 (ENDOMECHANICALS) ×1 IMPLANT
SOLUTION ELECTROLUBE (MISCELLANEOUS) IMPLANT
SUT MNCRL AB 3-0 PS2 27 (SUTURE) ×4 IMPLANT
SUT VICRYL 0 ENDOLOOP (SUTURE) IMPLANT
SUT VICRYL 0 TIES 12 18 (SUTURE) IMPLANT
SUT VICRYL 0 UR6 27IN ABS (SUTURE) ×4 IMPLANT
SYR 50ML LL SCALE MARK (SYRINGE) IMPLANT
TOWEL OR 17X24 6PK STRL BLUE (TOWEL DISPOSABLE) ×8 IMPLANT
TRAY FOLEY CATH SILVER 14FR (SET/KITS/TRAYS/PACK) ×4 IMPLANT
TROCAR XCEL NON-BLD 11X100MML (ENDOMECHANICALS) ×4 IMPLANT
TROCAR XCEL NON-BLD 5MMX100MML (ENDOMECHANICALS) ×4 IMPLANT
TUBING AQUILEX INFLOW (TUBING) ×4 IMPLANT
TUBING AQUILEX OUTFLOW (TUBING) ×4 IMPLANT
WARMER LAPAROSCOPE (MISCELLANEOUS) ×4 IMPLANT
WATER STERILE IRR 1000ML POUR (IV SOLUTION) ×3 IMPLANT

## 2015-09-26 NOTE — Anesthesia Procedure Notes (Signed)
Procedure Name: Intubation Date/Time: 09/26/2015 10:10 AM Performed by: Shanon Payor Pre-anesthesia Checklist: Patient identified, Emergency Drugs available, Suction available, Timeout performed and Patient being monitored Patient Re-evaluated:Patient Re-evaluated prior to inductionOxygen Delivery Method: Circle system utilized Preoxygenation: Pre-oxygenation with 100% oxygen Intubation Type: IV induction Ventilation: Mask ventilation without difficulty Grade View: Grade III Tube size: 7.0 mm Number of attempts: 1 Airway Equipment and Method: Lighted stylet Placement Confirmation: positive ETCO2 and breath sounds checked- equal and bilateral Secured at: 22 cm Tube secured with: Tape Dental Injury: Teeth and Oropharynx as per pre-operative assessment  Difficulty Due To: Difficulty was unanticipated and Difficult Airway- due to limited oral opening

## 2015-09-26 NOTE — Op Note (Signed)
Preop Diagnosis: 1.Complex Right Ovarian Cyst 2.Menorrhagia 3.Pelvic Pain  Postop Diagnosis: 1.Complex Right Ovarian Cyst 2.Menorrhagia 3.Pelvic Pain 4.Appearance of endometriosis  Procedure: 1.LAPAROSCOPIC RIGHT OVARIAN CYSTECTOMY 2.BILATERAL SALPINGECTOMY 3.HYSTEROSCOPY 4.DILATION and CURETTAGE 5.NOVASURE ABLATION  Anesthesia: General   Anesthesiologist: Dr. Cristela Blue  Attending: Osborn Coho, MD   Assistant:  Henreitta Leber, PA-C  Findings: 1.Small cyst on right ovary 2.Polypoid appearing endometrium 3.Window in posterior cul-de-sac 4.Tubes had appearance of prior BTL 5.Uterus sounded to 9.5cm 6.Cervical length 4.0cm 7.Cavity length 5.5cm 8.Cavity Width 3.8cm 9.Power 145 watts 10.Ablation time and 1second  Pathology: 1.Right Ovarian Cyst 2.Endometrial Curettings 3.Biopsies of the peritoneum bilaterally over the uterosacral ligaments 4.Bilateral fallopian tubes  Fluids: 1800 cc  UOP: 300 cc  EBL: 5 cc  Complications: None  Procedure: The patient was taken to the operating room after the risks, benefits, alternatives, complications, treatment options, and expected outcomes were discussed with the patient. The patient verbalized understanding, the patient concurred with the proposed plan and consent signed and witnessed. The patient was taken to the Operating Room and identified as [name] and the procedure verified as laparoscopic right ovarian cystectomy, bilateral salpingectomy, hysteroscopy, D&C and ablation.  The patient was placed under general anesthesia per anesthesia staff, the patient was placed in modified dorsal lithotomy position and was prepped, draped, and catheterized in the normal, sterile fashion.  A Time Out was held and the above information confirmed.  The cervix was visualized and an intrauterine manipulator was placed.  A 10 mm umbilical incision was then performed. Veress needle was passed and pneumoperitoneum was established. A 10 mm  trocar was advanced into the intraabdominal cavity, the laparoscope was introduced and findings as noted above.  Patient was placed in trendelenburg and marcaine injected in the LLQ and a 5 mm incision was made and 5 mm trocar advanced into the intraabdominal cavity.  The same was done in the suprapubic area with a 5mm trocar.   The gyrus tripolar was used to cauterize and excise the right fallopian tube which was then removed and sent to pathology.  The same was done on the contralateral side.  Monopolar scissors were used to incise over the right ovarian cyst and cyst was dissected out without difficulty.  It actually had the appearance of a corpus luteal cyst.  The ovarian bed was cauterized for hemostasis.   A peritoneal window in the posterior cul-de-sac was identified and biopsies were taken over the uterosacral ligaments to evaluate for endometriosis.  Irrigation was performed of the pelvis and good hemostasis noted.  Suprapubic and LLQ trocars were removed under direct visualization.  Pneumoperitoneum was relieved and umbilical trocar removed under direct visualization.  The umbilical fascia was repaired with 0 vicryl.  The 10 mm skin incision was repaired with 3-0 monocryl via a subcuticular stitch and liquaband applied to all incisions.    Attention was then paid to the perineum where a bivalve speculum was placed in the patient's vagina and the anterior lip of the cervix was grasped with a single tooth tenaculum. A paracervical block was administered using a total of 10 cc of 1% lidocaine. The uterus sounded to 9.5cm. The cervix was dilated for passage of the hysteroscope. The hysteroscope was introduced into the uterine cavity and findings as noted above. Sharp curettage was performed until a gritty texture was noted and currettings sent to pathology. The hysteroscope was reintroduced and no obvious remaining intracavitary lesions were noted. The Novasure instrument was introduced and ablation  performed  without difficulty. Hysteroscope reintroduced and good ablation results were noted. All instruments were removed.   Sponge, instrument, lap and needle counts were correct.  The patient tolerated the procedure well and was awaiting extubation and transfer to the recovery room in good condition.

## 2015-09-26 NOTE — Interval H&P Note (Signed)
History and Physical Interval Note:  09/26/2015 9:53 AM  Angel [name]  has presented today for surgery, with the diagnosis of Complex Right Ovarian Cyst,MENORRHAGIA  The various methods of treatment have been discussed with the patient and family. After consideration of risks, benefits and other options for treatment, the patient has consented to  Procedure(s): LAPAROSCOPIC OVARIAN CYSTECTOMY (Right) DILATATION & CURETTAGE/HYSTEROSCOPY WITH NOVASURE ABLATION (N/A) as a surgical intervention .  The patient's history has been reviewed, patient examined, no change in status, stable for surgery.  I have reviewed the patient's chart and labs.  Questions were answered to the patient's satisfaction.     Purcell Nails

## 2015-09-26 NOTE — Transfer of Care (Signed)
Immediate Anesthesia Transfer of Care Note  Patient: Angel [name]  Procedure(s) Performed: Procedure(s): LAPAROSCOPIC Right OVARIAN CYSTECTOMY, Bilateral Peritonesl Biopsys over Utersacral area (Right) DILATATION & CURETTAGE/HYSTEROSCOPY WITH NOVASURE ABLATION (N/A) BILATERAL SALPINGECTOMY  Patient Location: PACU  Anesthesia Type:General  Level of Consciousness: awake, alert  and oriented  Airway & Oxygen Therapy: Patient Spontanous Breathing and Patient connected to nasal cannula oxygen  Post-op Assessment: Report given to RN and Post -op Vital signs reviewed and stable  Post vital signs: Reviewed and stable  Last Vitals:  Filed Vitals:   09/26/15 0831  BP: 114/61  Pulse: 64  Temp: 36.6 C  Resp: 18    Complications: No apparent anesthesia complications

## 2015-09-26 NOTE — Anesthesia Preprocedure Evaluation (Signed)
Anesthesia Evaluation  Patient identified by MRN, date of birth, ID band Patient awake    Reviewed: Allergy & Precautions, H&P , Patient's Chart, lab work & pertinent test results, reviewed documented beta blocker date and time   Airway Mallampati: II  TM Distance: >3 FB Neck ROM: full    Dental no notable dental hx.    Pulmonary    Pulmonary exam normal breath sounds clear to auscultation       Cardiovascular  Rhythm:regular Rate:Normal     Neuro/Psych    GI/Hepatic GERD  ,  Endo/Other    Renal/GU      Musculoskeletal   Abdominal   Peds  Hematology   Anesthesia Other Findings No diuretics for > 1 month  Reproductive/Obstetrics                             Anesthesia Physical Anesthesia Plan  ASA: II  Anesthesia Plan: General   Post-op Pain Management:    Induction: Intravenous  Airway Management Planned: Oral ETT  Additional Equipment:   Intra-op Plan:   Post-operative Plan: Extubation in OR  Informed Consent: I have reviewed the patients History and Physical, chart, labs and discussed the procedure including the risks, benefits and alternatives for the proposed anesthesia with the patient or authorized representative who has indicated his/her understanding and acceptance.   Dental Advisory Given and Dental advisory given  Plan Discussed with: CRNA and Surgeon  Anesthesia Plan Comments: (  Discussed general anesthesia, including possible nausea, instrumentation of airway, sore throat,pulmonary aspiration, etc. I asked if the were any outstanding questions, or  concerns before we proceeded. )        Anesthesia Quick Evaluation

## 2015-09-26 NOTE — Discharge Instructions (Signed)
Call [facility] @ [phone] if:  You have a temperature greater than or equal to 100.4 degrees Farenheit orally You have pain that is not made better by the pain medication given and taken as directed You have excessive bleeding or problems urinating  Take Colace (Docusate Sodium/Stool Softener) 100 mg 2-3 times daily while taking narcotic pain medicine to avoid constipation or until bowel movements are regular. Take Ibuprofen 600 mg with food every 6 hours for the next 3 days then as needed for pain  You may drive after you are no longer taking narcotic pain medication You may walk up steps  You may shower tomorrow You may resume a regular diet Keep incisions clean and dry  Do Avoid anything in vagina  until after your post-operative visit DISCHARGE INSTRUCTIONS: Laparoscopy  The following instructions have been prepared to help you care for yourself upon your return home today.  Wound care:  Do not get the incision wet for the first 24 hours. The incision should be kept clean and dry.  The Band-Aids or dressings may be removed the day after surgery.  Should the incision become sore, red, and swollen after the first week, check with your doctor.  Personal hygiene:  Shower the day after your procedure.  Activity and limitations:  Do NOT drive or operate any equipment today.  Do NOT lift anything more than 15 pounds for 2-3 weeks after surgery.  Do NOT rest in bed all day.  Walking is encouraged. Walk each day, starting slowly with 5-minute walks 3 or 4 times a day. Slowly increase the length of your walks.  Walk up and down stairs slowly.  Do NOT do strenuous activities, such as golfing, playing tennis, bowling, running, biking, weight lifting, gardening, mowing, or vacuuming for 2-4 weeks. Ask your doctor when it is okay to start.  Diet: Eat a light meal as desired this evening. You may resume your usual diet tomorrow.  Return to work: This is  dependent on the type of work you do. For the most part you can return to a desk job within a week of surgery. If you are more active at work, please discuss this with your doctor.  What to expect after your surgery: You may have a slight burning sensation when you urinate on the first day. You may have a very small amount of blood in the urine. Expect to have a small amount of vaginal discharge/light bleeding for 1-2 weeks. It is not unusual to have abdominal soreness and bruising for up to 2 weeks. You may be tired and need more rest for about 1 week. You may experience shoulder pain for 24-72 hours. Lying flat in bed may relieve it.  Call your doctor for any of the following:  Develop a fever of 100.4 or greater  Inability to urinate 6 hours after discharge from hospital  Severe pain not relieved by pain medications  Persistent of heavy bleeding at incision site  Redness or swelling around incision site after a week  Increasing nausea or vomiting  Patient Signature________________________________________ Nurse Signature_________________________________________

## 2015-09-26 NOTE — Anesthesia Postprocedure Evaluation (Signed)
Anesthesia Post Note  Patient: Angel [name]  Procedure(s) Performed: Procedure(s) (LRB): LAPAROSCOPIC Right OVARIAN CYSTECTOMY, Bilateral Peritoneal Biopsies over Uteralsacral area (Right) DILATATION & CURETTAGE/HYSTEROSCOPY WITH NOVASURE ABLATION (N/A) BILATERAL SALPINGECTOMY  Patient location during evaluation: PACU Anesthesia Type: General Level of consciousness: sedated Pain management: pain level controlled Vital Signs Assessment: post-procedure vital signs reviewed and stable Respiratory status: spontaneous breathing and respiratory function stable Cardiovascular status: stable Anesthetic complications: no    Last Vitals:  Filed Vitals:   09/26/15 1358 09/26/15 1400  BP:    Pulse: 91 93  Temp:    Resp: 18     Last Pain: There were no vitals filed for this visit.               Lorayne Getchell DANIEL

## 2015-09-26 NOTE — H&P (View-Only) (Signed)
Angel [name] is a 40 y.o. female  P: 1-1-0-2 presents for laparoscopic right ovarian cystectomy, bilateral salpingectomy and endometrial ablation because of pelvic pain, a right ovarian cyst, and irregular bleeding.  Over the past several years the patient has experienced random pelvic pain described as sharp and shooting and lasting for several hours.  In the past year,  these pain episodes have increased along with post coital pain and occasional pain in rectal area.   Over the past three months the patient experiences pelvic pain most days of the week and describes the pain as achy/sharp without any initiating or alleviating factors.   She denies post coital bleeding, vaginitis symptoms or problems with urination.  She does admit to constipation in recent months. Her menstrual flow lasts for 4 days and is preceded by several days of spotting.  She changes a super tampon 7 times a day and for her cramps (rated 8/10 on a 10 point pain scale) she takes Ibuprofen 800 mg,  relieving them to 5/10.  A pelvic ultrasound in January 2017 showed: uterus: 6.98 x 3.87 x 6.16 cm,  left intramural fibroid: 1.1 x 1.0 x 0.8 cm, normal left ovary and right ovary with a complex cystic structure: 2.0 x 1.5 x 1.9  cm.   The size of the right ovarian cysts showed a slight increase in size from previous imaging in November 2016 when a CA-125 was found to be normal.  A CBC and TSH done in [place] were also normal.    A review of both medical and surgical management options were given to the patient however she has decided to proceed with surgical evaluation of her pelvic pain, removal of the right ovarian cyst,  removal of both tubes and endometrial ablation.  Past Medical History  OB History: G: 2;    P: 1-1-0-2;  SVB 2003 and 2007  GYN History: menarche: 40 YO    LMP: 08/31/2015    Contracepton condoms  The patient denies history of sexually transmitted disease.  Has a history of abnormal PAP smear treated with Cryotherapy;    Last PAP smear: April 2016-normal  Medical History: Vitamin D Deficiency, GERD, Insomnia,  Left Breast Cyst, Lumbar Disc Herniation, Hemorrhoids and Paresthesias   Surgical History:  1996 Cervical Cryotherapy;   2000 De Quervain's Release;  2001 Left Ganglion Cyst Removal;  2005 Right Ganglion Cyst Removal; 2007 Tubal Sterilization;  2014 Left Breast Lumpectomy (benign) Denies problems with anesthesia or history of blood transfusions  Family History: Cardiovascular Disease, Hypertension, Diabetes Mellitus, Alzheimers, Breast, Prostate and Pharygeal Cancer and Sickle Cell Anemia  Social History:   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 clindamycin (CLINDAGEL) 1 % gel Apply 1 application topically daily.   . cyclobenzaprine (FLEXERIL) 5 MG tablet TAKE 1 TABLET BY MOUTH 3 TIMES A DAY (Patient taking differently: TAKE 1 TABLET BY MOUTH 3 TIMES A DAY AS NEEDED FOR MUSCLE SPASMS)  . gabapentin (NEURONTIN) 300 MG capsule Take 300 mg by mouth daily.  . hydrocortisone (ANUSOL-HC) 25 MG suppository Place 1 suppository (25 mg total) rectally 2 (two) times daily as needed for hemorrhoids or itching.  Marland Kitchen ibuprofen (ADVIL,MOTRIN) 800 MG tablet Take 800 mg by mouth every 8 (eight) hours as needed for headache, mild pain or moderate pain.   . pantoprazole (PROTONIX) 40 MG tablet Take  1 tablet (40 mg total) by mouth daily.  . polyethylene glycol powder (GLYCOLAX/MIRALAX) powder Take 17 g by mouth 2 (two) times daily as needed for mild constipation or moderate constipation.  . prochlorperazine (COMPAZINE) 10 MG tablet Take 1 tablet (10 mg total) by mouth every 6 (six) hours as needed for nausea or vomiting.  Marland Kitchen spironolactone (ALDACTONE) 50 MG tablet Take 50 mg by mouth daily.  Marland Kitchen tretinoin (RETIN-A) 0.025 % cream Apply 1 application topically at  bedtime.   . Vitamin D, Ergocalciferol, (DRISDOL) 50000 UNITS CAPS capsule TAKE 1 CAPSULE (50,000 UNITS TOTAL) BY MOUTH ONCE A WEEK.   No facility-administered encounter medications on file as of 09/15/2015.    No Known Allergies    Denies sensitivity to peanuts, shellfish, soy, latex or adhesives.  ROS: Admits to constipation, lower back pain and right sciatica;  Denies headache, vision changes, nasal congestion, dysphagia, tinnitus, dizziness, hoarseness, cough,  chest pain, shortness of breath, nausea, vomiting, diarrhea, urinary frequency, urgency  dysuria, hematuria, vaginitis symptoms, pelvic pain, swelling of joints,easy bruising,  myalgias,  skin rashes, unexplained weight loss and except as is mentioned in the history of present illness, patient's review of systems is otherwise negative.   Physical Exam  Bp: 122/62          P: 80    R: 20    Temperature: 98.9 degrees F orally     Weight: 200 lbs.  Height: 5'6"  BMI: 32.3  Neck: supple without masses or thyromegaly Lungs: clear to auscultation Heart: regular rate and rhythm Abdomen: soft, non-tender and no organomegaly Pelvic:EGBUS- wnl; vagina-normal rugae; uterus-normal size, cervix without lesions or motion tenderness; adnexae-mild bilateral tenderness or masses Extremities:  no clubbing, cyanosis or edema   Assesment: Pelvic Pain           Right Ovarian Cyst           Irregular Bleeding   Disposition:  A discussion was held with patient regarding the indication for her procedure(s) along with the risks, which include but are not limited to: reaction to anesthesia, damage to adjacent organs (ureters, bladder, uterus and colon), infection and excessive bleeding.  The patient verbalized understanding of these risks and has consented to proceed with a Laparoscopic Right Ovarian Cystectomy, Bilateral Salpingectomy, Hysteroscopy, Dilatation, Curettage and Endometrial Ablation at [facility] [place] on September 26, 2015  at 10 a.m.   CSN# [id number]   Ahlia Lemanski J. Lowell Guitar, PA-C  for Dr.  Woodroe Mode. Su Hilt

## 2015-09-26 NOTE — Interval H&P Note (Signed)
History and Physical Interval Note:  09/26/2015 9:54 AM  Angel [name]  has presented today for surgery, with the diagnosis of Complex Right Ovarian Cyst,MENORRHAGIA  The various methods of treatment have been discussed with the patient and family. After consideration of risks, benefits and other options for treatment, the patient has consented to  Procedure(s): LAPAROSCOPIC OVARIAN CYSTECTOMY (Right), BILATERAL SALPINGECTOMY DILATATION & CURETTAGE/HYSTEROSCOPY WITH NOVASURE ABLATION (N/A) as a surgical intervention .  The patient's history has been reviewed, patient examined, no change in status, stable for surgery.  I have reviewed the patient's chart and labs.  Questions were answered to the patient's satisfaction.     Purcell Nails

## 2015-09-27 ENCOUNTER — Encounter (HOSPITAL_COMMUNITY): Payer: Self-pay | Admitting: Obstetrics and Gynecology

## 2015-11-20 ENCOUNTER — Other Ambulatory Visit: Payer: Self-pay | Admitting: Internal Medicine

## 2015-11-21 NOTE — Telephone Encounter (Signed)
Pt has been on this rx since about 2014. Pls advise if rx rf is appropriate.

## 2015-12-08 ENCOUNTER — Other Ambulatory Visit: Payer: Self-pay | Admitting: Gastroenterology

## 2015-12-12 ENCOUNTER — Other Ambulatory Visit: Payer: Self-pay

## 2015-12-12 DIAGNOSIS — Z1231 Encounter for screening mammogram for malignant neoplasm of breast: Secondary | ICD-10-CM

## 2015-12-12 LAB — HM PAP SMEAR: HM Pap smear: NORMAL

## 2015-12-27 DIAGNOSIS — D229 Melanocytic nevi, unspecified: Secondary | ICD-10-CM | POA: Insufficient documentation

## 2016-01-10 ENCOUNTER — Ambulatory Visit: Payer: Commercial Managed Care - HMO

## 2016-01-10 ENCOUNTER — Ambulatory Visit
Admission: RE | Admit: 2016-01-10 | Discharge: 2016-01-10 | Disposition: A | Discharge status: Home / Self Care | Payer: Commercial Managed Care - HMO | Source: Ambulatory Visit

## 2016-01-10 DIAGNOSIS — Z1231 Encounter for screening mammogram for malignant neoplasm of breast: Secondary | ICD-10-CM

## 2016-01-19 ENCOUNTER — Ambulatory Visit (INDEPENDENT_AMBULATORY_CARE_PROVIDER_SITE_OTHER): Payer: Commercial Managed Care - HMO | Admitting: Internal Medicine

## 2016-01-19 ENCOUNTER — Encounter: Payer: Self-pay | Admitting: Internal Medicine

## 2016-01-19 ENCOUNTER — Other Ambulatory Visit (INDEPENDENT_AMBULATORY_CARE_PROVIDER_SITE_OTHER): Payer: Commercial Managed Care - HMO

## 2016-01-19 VITALS — BP 98/60 | HR 79 | Ht 66.0 in | Wt 197.0 lb

## 2016-01-19 DIAGNOSIS — D573 Sickle-cell trait: Secondary | ICD-10-CM | POA: Diagnosis not present

## 2016-01-19 DIAGNOSIS — M5440 Lumbago with sciatica, unspecified side: Secondary | ICD-10-CM

## 2016-01-19 DIAGNOSIS — E559 Vitamin D deficiency, unspecified: Secondary | ICD-10-CM | POA: Diagnosis not present

## 2016-01-19 DIAGNOSIS — H6121 Impacted cerumen, right ear: Secondary | ICD-10-CM | POA: Diagnosis not present

## 2016-01-19 DIAGNOSIS — Z0000 Encounter for general adult medical examination without abnormal findings: Secondary | ICD-10-CM

## 2016-01-19 DIAGNOSIS — Z23 Encounter for immunization: Secondary | ICD-10-CM

## 2016-01-19 LAB — URINALYSIS
Bilirubin Urine: NEGATIVE
HGB URINE DIPSTICK: NEGATIVE
KETONES UR: 15 — AB
LEUKOCYTES UA: NEGATIVE
Nitrite: NEGATIVE
Specific Gravity, Urine: 1.015 (ref 1.000–1.030)
Total Protein, Urine: NEGATIVE
URINE GLUCOSE: NEGATIVE
UROBILINOGEN UA: 0.2 (ref 0.0–1.0)
pH: 6 (ref 5.0–8.0)

## 2016-01-19 LAB — LIPID PANEL
CHOL/HDL RATIO: 3
Cholesterol: 189 mg/dL (ref 0–200)
HDL: 73.4 mg/dL (ref >39.00)
LDL Cholesterol: 106 mg/dL — ABNORMAL HIGH (ref 0–99)
NONHDL: 115.23
TRIGLYCERIDES: 46 mg/dL (ref 0.0–149.0)
VLDL: 9.2 mg/dL (ref 0.0–40.0)

## 2016-01-19 LAB — CBC WITH DIFFERENTIAL/PLATELET
BASOS PCT: 0.5 % (ref 0.0–3.0)
Basophils Absolute: 0 10*3/uL (ref 0.0–0.1)
EOS PCT: 1.1 % (ref 0.0–5.0)
Eosinophils Absolute: 0.1 10*3/uL (ref 0.0–0.7)
HCT: 38.7 % (ref 36.0–46.0)
HEMOGLOBIN: 12.9 g/dL (ref 12.0–15.0)
LYMPHS ABS: 1.4 10*3/uL (ref 0.7–4.0)
Lymphocytes Relative: 28 % (ref 12.0–46.0)
MCHC: 33.4 g/dL (ref 30.0–36.0)
MCV: 88.8 fl (ref 78.0–100.0)
MONO ABS: 0.4 10*3/uL (ref 0.1–1.0)
MONOS PCT: 8.1 % (ref 3.0–12.0)
Neutro Abs: 3 10*3/uL (ref 1.4–7.7)
Neutrophils Relative %: 62.3 % (ref 43.0–77.0)
Platelets: 281 10*3/uL (ref 150.0–400.0)
RBC: 4.36 Mil/uL (ref 3.87–5.11)
RDW: 13.9 % (ref 11.5–15.5)
WBC: 4.8 10*3/uL (ref 4.0–10.5)

## 2016-01-19 LAB — BASIC METABOLIC PANEL
BUN: 13 mg/dL (ref 6–23)
CHLORIDE: 103 meq/L (ref 96–112)
CO2: 29 mEq/L (ref 19–32)
Calcium: 9.4 mg/dL (ref 8.4–10.5)
Creatinine, Ser: 0.84 mg/dL (ref 0.40–1.20)
GFR: 96.71 mL/min (ref >60.00)
GLUCOSE: 92 mg/dL (ref 70–99)
POTASSIUM: 4.3 meq/L (ref 3.5–5.1)
SODIUM: 138 meq/L (ref 135–145)

## 2016-01-19 LAB — HEPATIC FUNCTION PANEL
ALT: 10 U/L (ref 0–35)
AST: 16 U/L (ref 0–37)
Albumin: 4 g/dL (ref 3.5–5.2)
Alkaline Phosphatase: 46 U/L (ref 39–117)
BILIRUBIN TOTAL: 0.5 mg/dL (ref 0.2–1.2)
Bilirubin, Direct: 0.1 mg/dL (ref 0.0–0.3)
TOTAL PROTEIN: 7 g/dL (ref 6.0–8.3)

## 2016-01-19 LAB — TSH: TSH: 1.3 u[IU]/mL (ref 0.35–4.50)

## 2016-01-19 LAB — VITAMIN D 25 HYDROXY (VIT D DEFICIENCY, FRACTURES): VITD: 60.03 ng/mL (ref 30.00–100.00)

## 2016-01-19 NOTE — Assessment & Plan Note (Signed)
2017 MSK - bulging disks -- [facility] [employer]([address]) - had injections

## 2016-01-19 NOTE — Progress Notes (Signed)
Subjective:  Patient ID: Angel [name], female    DOB: [birth year]  Age: 40 y.o. MRN: [id number]  CC: Annual Exam   HPI Angel [name] presents for a well exam  Outpatient Prescriptions Prior to Visit  Medication Sig Dispense Refill  . clindamycin (CLINDAGEL) 1 % gel Apply 1 application topically daily.   11  . cyclobenzaprine (FLEXERIL) 5 MG tablet TAKE 1 TABLET BY MOUTH 3 TIMES A DAY (Patient taking differently: TAKE 1 TABLET BY MOUTH 3 TIMES A DAY AS NEEDED FOR MUSCLE SPASMS) 60 tablet 1  . hydrocortisone (ANUSOL-HC) 25 MG suppository Place 1 suppository (25 mg total) rectally 2 (two) times daily as needed for hemorrhoids or itching. 20 suppository 0  . ibuprofen (ADVIL,MOTRIN) 600 MG tablet 1 po  pc  every 6 hours for 5 days then as needed for pain 30 tablet 1  . pantoprazole (PROTONIX) 40 MG tablet Take 1 tablet (40 mg total) by mouth daily. 90 tablet 3  . polyethylene glycol powder (GLYCOLAX/MIRALAX) powder Take 17 g by mouth 2 (two) times daily as needed for mild constipation or moderate constipation. 500 g 1  . prochlorperazine (COMPAZINE) 10 MG tablet Take 1 tablet (10 mg total) by mouth every 6 (six) hours as needed for nausea or vomiting. 30 tablet 1  . spironolactone (ALDACTONE) 50 MG tablet Take 50 mg by mouth daily.  3  . tretinoin (RETIN-A) 0.025 % cream Apply 1 application topically at bedtime.   11  . Vitamin D, Ergocalciferol, (DRISDOL) 50000 units CAPS capsule Take 1 capsule (50,000 Units total) by mouth once a week. 12 capsule 1  . gabapentin (NEURONTIN) 300 MG capsule Take 300 mg by mouth daily. Reported on 01/19/2016  0  . oxyCODONE-acetaminophen (PERCOCET/ROXICET) 5-325 MG tablet Take 1-2 tablets by mouth every 6 (six) hours as needed for severe pain. (Patient not taking: Reported on 01/19/2016) 30 tablet 0  . Vitamin D, Ergocalciferol, (DRISDOL) 50000 UNITS CAPS capsule TAKE 1 CAPSULE (50,000 UNITS TOTAL) BY MOUTH ONCE A WEEK. (Patient not taking: Reported on  01/19/2016) 6 capsule 2   No facility-administered medications prior to visit.    ROS Review of Systems  Constitutional: Negative for chills, activity change, appetite change, fatigue and unexpected weight change.  HENT: Negative for congestion, mouth sores and sinus pressure.   Eyes: Negative for visual disturbance.  Respiratory: Negative for cough and chest tightness.   Gastrointestinal: Negative for nausea and abdominal pain.  Genitourinary: Negative for frequency, difficulty urinating and vaginal pain.  Musculoskeletal: Positive for back pain. Negative for gait problem.  Skin: Negative for pallor and rash.  Neurological: Negative for dizziness, tremors, weakness, numbness and headaches.  Psychiatric/Behavioral: Negative for suicidal ideas, confusion and sleep disturbance.    Objective:  BP 98/60 mmHg  Pulse 79  Ht  (1.676 m)  Wt 197 lb (89.359 kg)  BMI 31.81 kg/m2  SpO2 99%  BP Readings from Last 3 Encounters:  01/19/16 98/60  09/26/15 113/58  06/02/15 110/72    Wt Readings from Last 3 Encounters:  01/19/16 197 lb (89.359 kg)  09/26/15 200 lb (90.719 kg)  06/02/15 193 lb (87.544 kg)    Physical Exam  Constitutional: She appears well-developed. No distress.  HENT:  Head: Normocephalic.  Right Ear: External ear normal.  Left Ear: External ear normal.  Nose: Nose normal.  Mouth/Throat: Oropharynx is clear and moist.  Eyes: Conjunctivae are normal. Pupils are equal, round, and reactive to light. Right eye exhibits no discharge. Left  eye exhibits no discharge.  Neck: Normal range of motion. Neck supple. No JVD present. No tracheal deviation present. No thyromegaly present.  Cardiovascular: Normal rate, regular rhythm and normal heart sounds.   Pulmonary/Chest: No stridor. No respiratory distress. She has no wheezes.  Abdominal: Soft. Bowel sounds are normal. She exhibits no distension and no mass. There is no tenderness. There is no rebound and no guarding.    Musculoskeletal: She exhibits tenderness. She exhibits no edema.  Lymphadenopathy:    She has no cervical adenopathy.  Neurological: She displays normal reflexes. No cranial nerve deficit. She exhibits normal muscle tone. Coordination normal.  Skin: No rash noted. No erythema.  Psychiatric: She has a normal mood and affect. Her behavior is normal. Judgment and thought content normal.  wax R ear   Procedure Note :     Procedure :  Ear irrigation   Indication:  Cerumen impaction R   Risks, including pain, dizziness, eardrum perforation, bleeding, infection and others as well as benefits were explained to the patient in detail. Verbal consent was obtained and the patient agreed to proceed.    We used "The Elephant Ear Irrigation Device" filled with lukewarm water for irrigation. A large amount wax was recovered. Procedure has also required manual wax removal with an ear loop.   Tolerated well. Complications: None.   Postprocedure instructions :  Call if problems.    Lab Results  Component Value Date   WBC 5.4 09/26/2015   HGB 12.1 09/26/2015   HCT 35.6* 09/26/2015   PLT 285 09/26/2015   GLUCOSE 91 06/02/2015   CHOL 171 10/18/2014   TRIG 50.0 10/18/2014   HDL 67.80 10/18/2014   LDLCALC 93 10/18/2014   ALT 16 06/02/2015   AST 16 06/02/2015   NA 139 06/02/2015   K 4.2 06/02/2015   CL 105 06/02/2015   CREATININE 0.92 06/02/2015   BUN 19 06/02/2015   CO2 28 06/02/2015   TSH 1.66 10/18/2014   HGBA1C 5.6 10/18/2014    Mm Digital Screening Bilateral  01/11/2016  CLINICAL DATA:  Screening. EXAM: DIGITAL SCREENING BILATERAL MAMMOGRAM WITH CAD COMPARISON:  Previous exam(s). ACR Breast Density Category c: The breast tissue is heterogeneously dense, which may obscure small masses. FINDINGS: There are no findings suspicious for malignancy. Images were processed with CAD. IMPRESSION: No mammographic evidence of malignancy. A result letter of this screening mammogram will be mailed  directly to the patient. RECOMMENDATION: Screening mammogram in one year. (Code:SM-B-01Y) BI-RADS CATEGORY  1: Negative. Electronically Signed   By: Annia Beltrew  Davis M.D.   On: 01/11/2016 16:39    Assessment & Plan:   There are no diagnoses linked to this encounter. I am having Ms. Raburn maintain her polyethylene glycol powder, clindamycin, spironolactone, tretinoin, prochlorperazine, pantoprazole, hydrocortisone, cyclobenzaprine, gabapentin, oxyCODONE-acetaminophen, ibuprofen, and Vitamin D (Ergocalciferol).  No orders of the defined types were placed in this encounter.     Follow-up: No Follow-up on file.  Sonda PrimesAlex Plotnikov, MD

## 2016-01-19 NOTE — Assessment & Plan Note (Signed)
Will irrigate 

## 2016-01-19 NOTE — Assessment & Plan Note (Signed)
We discussed age appropriate health related issues, including available/recomended screening tests and vaccinations. We discussed a need for adhering to healthy diet and exercise. Labs/EKG were reviewed/ordered. All questions were answered.   

## 2016-01-19 NOTE — Progress Notes (Signed)
Pre visit review using our clinic review tool, if applicable. No additional management support is needed unless otherwise documented below in the visit note. 

## 2016-01-19 NOTE — Assessment & Plan Note (Signed)
Pneumovax 

## 2016-01-19 NOTE — Assessment & Plan Note (Signed)
On Vit D 

## 2016-01-19 NOTE — Addendum Note (Signed)
Addended by: Merrilyn PumaSIMMONS, Tiffanyann Deroo N on: 01/19/2016 12:16 PM   Modules accepted: Orders

## 2016-07-12 ENCOUNTER — Ambulatory Visit (INDEPENDENT_AMBULATORY_CARE_PROVIDER_SITE_OTHER): Payer: Commercial Managed Care - HMO | Admitting: Nurse Practitioner

## 2016-07-12 ENCOUNTER — Ambulatory Visit (INDEPENDENT_AMBULATORY_CARE_PROVIDER_SITE_OTHER)
Admission: RE | Admit: 2016-07-12 | Discharge: 2016-07-12 | Disposition: A | Discharge status: Home / Self Care | Payer: Commercial Managed Care - HMO | Source: Ambulatory Visit | Attending: Nurse Practitioner | Admitting: Nurse Practitioner

## 2016-07-12 ENCOUNTER — Encounter: Payer: Self-pay | Admitting: Nurse Practitioner

## 2016-07-12 VITALS — BP 110/78 | HR 66 | Temp 97.7°F | Wt 195.0 lb

## 2016-07-12 DIAGNOSIS — W19XXXA Unspecified fall, initial encounter: Secondary | ICD-10-CM

## 2016-07-12 DIAGNOSIS — M545 Low back pain, unspecified: Secondary | ICD-10-CM

## 2016-07-12 DIAGNOSIS — M1611 Unilateral primary osteoarthritis, right hip: Secondary | ICD-10-CM

## 2016-07-12 MED ORDER — KETOROLAC TROMETHAMINE 60 MG/2ML IM SOLN
60.0000 mg | Freq: Once | INTRAMUSCULAR | Status: AC
  Administered 2016-07-12: 30 mg via INTRAMUSCULAR

## 2016-07-12 MED ORDER — PREDNISONE 10 MG (21) PO TBPK: 10.0000 mg | ORAL_TABLET | ORAL | 0 refills | Status: DC

## 2016-07-12 MED ORDER — CYCLOBENZAPRINE HCL 5 MG PO TABS: 5.0000 mg | ORAL_TABLET | Freq: Every day | ORAL | 0 refills | Status: DC

## 2016-07-12 MED ORDER — KETOROLAC TROMETHAMINE 30 MG/ML IJ SOLN: 30.0000 mg | Freq: Once | INTRAMUSCULAR | Status: DC

## 2016-07-12 NOTE — Patient Instructions (Signed)
Go to basement for hip x-ray. You will be called with results.  Will send prescription for oral prednisone and muscle relaxant if normal x-ray

## 2016-07-12 NOTE — Progress Notes (Signed)
Subjective:  Patient ID: Angel [name], female    DOB: 1976/02/29  Age: 40 y.o. MRN: [id number]  CC: Back Pain (aworse after fall 100month ago, tripped over suitcase at home and fell on right hip.)  Back Pain  Chronicity: acute on chronic. The current episode started 1 to 4 weeks ago. The problem occurs constantly. The problem has been gradually worsening since onset. The pain is present in the lumbar spine and gluteal. The quality of the pain is described as aching and cramping. The pain does not radiate. The symptoms are aggravated by lying down and sitting. Pertinent negatives include no abdominal pain, bladder incontinence, bowel incontinence, dysuria, fever, leg pain, numbness, paresis, paresthesias, pelvic pain, tingling or weakness. Risk factors include recent trauma. She has tried NSAIDs and muscle relaxant for the symptoms. The treatment provided mild relief.    Outpatient Medications Prior to Visit  Medication Sig Dispense Refill  . clindamycin (CLINDAGEL) 1 % gel Apply 1 application topically daily.   11  . gabapentin (NEURONTIN) 300 MG capsule Take 300 mg by mouth daily. Reported on 01/19/2016  0  . hydrocortisone (ANUSOL-HC) 25 MG suppository Place 1 suppository (25 mg total) rectally 2 (two) times daily as needed for hemorrhoids or itching. 20 suppository 0  . ibuprofen (ADVIL,MOTRIN) 600 MG tablet 1 po  pc  every 6 hours for 5 days then as needed for pain 30 tablet 1  . pantoprazole (PROTONIX) 40 MG tablet Take 1 tablet (40 mg total) by mouth daily. 90 tablet 3  . polyethylene glycol powder (GLYCOLAX/MIRALAX) powder Take 17 g by mouth 2 (two) times daily as needed for mild constipation or moderate constipation. 500 g 1  . prochlorperazine (COMPAZINE) 10 MG tablet Take 1 tablet (10 mg total) by mouth every 6 (six) hours as needed for nausea or vomiting. 30 tablet 1  . spironolactone (ALDACTONE) 50 MG tablet Take 50 mg by mouth daily.  3  . tretinoin (RETIN-A) 0.025 % cream Apply 1  application topically at bedtime.   11  . oxyCODONE-acetaminophen (PERCOCET/ROXICET) 5-325 MG tablet Take 1-2 tablets by mouth every 6 (six) hours as needed for severe pain. (Patient not taking: Reported on 07/12/2016) 30 tablet 0  . Vitamin D, Ergocalciferol, (DRISDOL) 50000 units CAPS capsule Take 1 capsule (50,000 Units total) by mouth once a week. (Patient not taking: Reported on 07/12/2016) 12 capsule 1  . cyclobenzaprine (FLEXERIL) 5 MG tablet TAKE 1 TABLET BY MOUTH 3 TIMES A DAY (Patient not taking: Reported on 07/12/2016) 60 tablet 1   No facility-administered medications prior to visit.     ROS See HPI  Objective:  BP 110/78   Pulse 66   Temp 97.7 F (36.5 C)   Wt 195 lb (88.5 kg)   SpO2 99%   BMI 31.47 kg/m   BP Readings from Last 3 Encounters:  07/12/16 110/78  01/19/16 98/60  09/26/15 (!) 113/58    Wt Readings from Last 3 Encounters:  07/12/16 195 lb (88.5 kg)  01/19/16 197 lb (89.4 kg)  09/26/15 200 lb (90.7 kg)    Physical Exam  Constitutional: She is oriented to person, place, and time.  Cardiovascular: Normal rate.   Pulmonary/Chest: Effort normal.  Abdominal: Soft. Bowel sounds are normal.  Musculoskeletal: Normal range of motion. She exhibits tenderness. She exhibits no edema.       Right hip: She exhibits tenderness. She exhibits normal range of motion, normal strength and no bony tenderness.  Lumbar back: She exhibits tenderness and pain. She exhibits normal range of motion, no bony tenderness, no swelling, no edema and no spasm.  Negative straight leg raise.  Neurological: She is alert and oriented to person, place, and time. She has normal reflexes.  Skin: Skin is warm and dry.    Lab Results  Component Value Date   WBC 4.8 01/19/2016   HGB 12.9 01/19/2016   HCT 38.7 01/19/2016   PLT 281.0 01/19/2016   GLUCOSE 92 01/19/2016   CHOL 189 01/19/2016   TRIG 46.0 01/19/2016   HDL 73.40 01/19/2016   LDLCALC 106 (H) 01/19/2016   ALT 10  01/19/2016   AST 16 01/19/2016   NA 138 01/19/2016   K 4.3 01/19/2016   CL 103 01/19/2016   CREATININE 0.84 01/19/2016   BUN 13 01/19/2016   CO2 29 01/19/2016   TSH 1.30 01/19/2016   HGBA1C 5.6 10/18/2014    No results found.  Assessment & Plan:   Angel [name] was seen today for back pain.  Diagnoses and all orders for this visit:  Arthritis of right hip -     predniSONE (STERAPRED UNI-PAK 21 TAB) 10 MG (21) TBPK tablet; Take 1 tablet (10 mg total) by mouth as directed. -     cyclobenzaprine (FLEXERIL) 5 MG tablet; Take 1 tablet (5 mg total) by mouth at bedtime.  Acute right-sided low back pain without sciatica -     DG HIP UNILAT W OR W/O PELVIS 2-3 VIEWS RIGHT; Future -     Discontinue: ketorolac (TORADOL) 30 MG/ML injection 30 mg; Inject 1 mL (30 mg total) into the muscle once. -     ketorolac (TORADOL) injection 60 mg; Inject 2 mLs (60 mg total) into the muscle once. -     predniSONE (STERAPRED UNI-PAK 21 TAB) 10 MG (21) TBPK tablet; Take 1 tablet (10 mg total) by mouth as directed. -     cyclobenzaprine (FLEXERIL) 5 MG tablet; Take 1 tablet (5 mg total) by mouth at bedtime.  Fall, initial encounter -     DG HIP UNILAT W OR W/O PELVIS 2-3 VIEWS RIGHT; Future -     Discontinue: ketorolac (TORADOL) 30 MG/ML injection 30 mg; Inject 1 mL (30 mg total) into the muscle once. -     predniSONE (STERAPRED UNI-PAK 21 TAB) 10 MG (21) TBPK tablet; Take 1 tablet (10 mg total) by mouth as directed. -     cyclobenzaprine (FLEXERIL) 5 MG tablet; Take 1 tablet (5 mg total) by mouth at bedtime.   I have changed Angel [name]'s cyclobenzaprine. I am also having her start on predniSONE. Additionally, I am having her maintain her polyethylene glycol powder, clindamycin, spironolactone, tretinoin, prochlorperazine, pantoprazole, hydrocortisone, gabapentin, oxyCODONE-acetaminophen, ibuprofen, and Vitamin D (Ergocalciferol). We administered ketorolac.  Meds ordered this encounter  Medications  .  DISCONTD: ketorolac (TORADOL) 30 MG/ML injection 30 mg  . ketorolac (TORADOL) injection 60 mg  . predniSONE (STERAPRED UNI-PAK 21 TAB) 10 MG (21) TBPK tablet    Sig: Take 1 tablet (10 mg total) by mouth as directed.    Dispense:  21 tablet    Refill:  0    Order Specific Question:   Supervising Provider    Answer:   Tresa Garter [[id number]]  . cyclobenzaprine (FLEXERIL) 5 MG tablet    Sig: Take 1 tablet (5 mg total) by mouth at bedtime.    Dispense:  14 tablet    Refill:  0    Order Specific Question:  Supervising Provider    Answer:   Tresa Garter [[id number]]    Follow-up: No Follow-up on file.  Alysia Penna, NP

## 2016-07-12 NOTE — Progress Notes (Signed)
Pre visit review using our clinic review tool, if applicable. No additional management support is needed unless otherwise documented below in the visit note. 

## 2016-07-20 ENCOUNTER — Encounter: Payer: Self-pay | Admitting: Internal Medicine

## 2016-07-20 ENCOUNTER — Ambulatory Visit (INDEPENDENT_AMBULATORY_CARE_PROVIDER_SITE_OTHER): Payer: Commercial Managed Care - HMO | Admitting: Internal Medicine

## 2016-07-20 DIAGNOSIS — G8929 Other chronic pain: Secondary | ICD-10-CM

## 2016-07-20 DIAGNOSIS — M545 Low back pain, unspecified: Secondary | ICD-10-CM

## 2016-07-20 DIAGNOSIS — M5440 Lumbago with sciatica, unspecified side: Secondary | ICD-10-CM | POA: Diagnosis not present

## 2016-07-20 DIAGNOSIS — W19XXXA Unspecified fall, initial encounter: Secondary | ICD-10-CM | POA: Diagnosis not present

## 2016-07-20 DIAGNOSIS — M1611 Unilateral primary osteoarthritis, right hip: Secondary | ICD-10-CM | POA: Diagnosis not present

## 2016-07-20 MED ORDER — TRAMADOL HCL 50 MG PO TABS
50.0000 mg | ORAL_TABLET | Freq: Two times a day (BID) | ORAL | 1 refills | Status: DC | PRN
Start: 2016-07-20 — End: 2016-10-26

## 2016-07-20 MED ORDER — CYCLOBENZAPRINE HCL 5 MG PO TABS: 5.0000 mg | ORAL_TABLET | Freq: Every day | ORAL | 3 refills | Status: DC

## 2016-07-20 NOTE — Patient Instructions (Addendum)
Hot yoga Body pillow GERD wedge McKenzie pillow Rice bag TRE (trauma release exercise)

## 2016-07-20 NOTE — Progress Notes (Signed)
Pre visit review using our clinic review tool, if applicable. No additional management support is needed unless otherwise documented below in the visit note. 

## 2016-07-20 NOTE — Progress Notes (Signed)
Subjective:  Patient ID: Angel [name], female    DOB: February 01, 1976  Age: 40 y.o. MRN: [id number]  CC: No chief complaint on file.   HPI SARANN MONTEALEGRE presents for LBP - fell 1 mo ago. R hip X ray w/OA. The pt had another LS MRI in [place] - had 4 inj F/u migraines, Vit D def  Outpatient Medications Prior to Visit  Medication Sig Dispense Refill  . clindamycin (CLINDAGEL) 1 % gel Apply 1 application topically daily.   11  . cyclobenzaprine (FLEXERIL) 5 MG tablet Take 1 tablet (5 mg total) by mouth at bedtime. 14 tablet 0  . hydrocortisone (ANUSOL-HC) 25 MG suppository Place 1 suppository (25 mg total) rectally 2 (two) times daily as needed for hemorrhoids or itching. 20 suppository 0  . ibuprofen (ADVIL,MOTRIN) 600 MG tablet 1 po  pc  every 6 hours for 5 days then as needed for pain 30 tablet 1  . oxyCODONE-acetaminophen (PERCOCET/ROXICET) 5-325 MG tablet Take 1-2 tablets by mouth every 6 (six) hours as needed for severe pain. 30 tablet 0  . pantoprazole (PROTONIX) 40 MG tablet Take 1 tablet (40 mg total) by mouth daily. 90 tablet 3  . polyethylene glycol powder (GLYCOLAX/MIRALAX) powder Take 17 g by mouth 2 (two) times daily as needed for mild constipation or moderate constipation. 500 g 1  . predniSONE (STERAPRED UNI-PAK 21 TAB) 10 MG (21) TBPK tablet Take 1 tablet (10 mg total) by mouth as directed. 21 tablet 0  . prochlorperazine (COMPAZINE) 10 MG tablet Take 1 tablet (10 mg total) by mouth every 6 (six) hours as needed for nausea or vomiting. 30 tablet 1  . spironolactone (ALDACTONE) 50 MG tablet Take 50 mg by mouth daily.  3  . tretinoin (RETIN-A) 0.025 % cream Apply 1 application topically at bedtime.   11  . Vitamin D, Ergocalciferol, (DRISDOL) 50000 units CAPS capsule Take 1 capsule (50,000 Units total) by mouth once a week. 12 capsule 1  . gabapentin (NEURONTIN) 300 MG capsule Take 300 mg by mouth daily. Reported on 01/19/2016  0   No facility-administered medications prior to  visit.     ROS Review of Systems  Objective:  BP (!) 90/58   Pulse 96   Wt 197 lb (89.4 kg)   SpO2 99%   BMI 31.80 kg/m   BP Readings from Last 3 Encounters:  07/20/16 (!) 90/58  07/12/16 110/78  01/19/16 98/60    Wt Readings from Last 3 Encounters:  07/20/16 197 lb (89.4 kg)  07/12/16 195 lb (88.5 kg)  01/19/16 197 lb (89.4 kg)    Physical Exam  Lab Results  Component Value Date   WBC 4.8 01/19/2016   HGB 12.9 01/19/2016   HCT 38.7 01/19/2016   PLT 281.0 01/19/2016   GLUCOSE 92 01/19/2016   CHOL 189 01/19/2016   TRIG 46.0 01/19/2016   HDL 73.40 01/19/2016   LDLCALC 106 (H) 01/19/2016   ALT 10 01/19/2016   AST 16 01/19/2016   NA 138 01/19/2016   K 4.3 01/19/2016   CL 103 01/19/2016   CREATININE 0.84 01/19/2016   BUN 13 01/19/2016   CO2 29 01/19/2016   TSH 1.30 01/19/2016   HGBA1C 5.6 10/18/2014    Dg Hip Unilat W Or W/o Pelvis 2-3 Views Right  Result Date: 07/12/2016 CLINICAL DATA:  Hip pain status post a fall 1 month ago EXAM: DG HIP (WITH OR WITHOUT PELVIS) 2-3V RIGHT COMPARISON:  Coronal and sagittal images from an  abdominal and pelvic CT scan of June 02, 2015 FINDINGS: The bony pelvis is subjectively adequately mineralized. There is no lytic or blastic lesion. No acute or old pelvic fracture is observed. AP and lateral views of the right hip reveal mild symmetric narrowing of the joint space. The articular surfaces of the femoral head and acetabulum remains smoothly rounded. The femoral neck, intertrochanteric, and subtrochanteric regions are normal. The soft tissues are unremarkable. IMPRESSION: Mild narrowing of the right hip joint space consistent with early osteoarthritis. No acute fracture nor dislocation. Electronically Signed   By: David  [place] M.D.   On: 07/12/2016 09:15    Assessment & Plan:   Diagnoses and all orders for this visit:  Acute right-sided low back pain without sciatica -     cyclobenzaprine (FLEXERIL) 5 MG tablet; Take 1  tablet (5 mg total) by mouth at bedtime.  Fall, initial encounter -     cyclobenzaprine (FLEXERIL) 5 MG tablet; Take 1 tablet (5 mg total) by mouth at bedtime.  Arthritis of right hip -     cyclobenzaprine (FLEXERIL) 5 MG tablet; Take 1 tablet (5 mg total) by mouth at bedtime.   I am having Ms. Espina maintain her polyethylene glycol powder, clindamycin, spironolactone, tretinoin, prochlorperazine, pantoprazole, hydrocortisone, gabapentin, oxyCODONE-acetaminophen, ibuprofen, Vitamin D (Ergocalciferol), predniSONE, and cyclobenzaprine.  No orders of the defined types were placed in this encounter.    Follow-up: No Follow-up on file.  Sonda Primes, MD

## 2016-07-20 NOTE — Assessment & Plan Note (Addendum)
Start PT Hot yoga Body pillow GERD wedge McKenzie pillow Rice bag TRE (trauma release exercise)

## 2016-08-15 DIAGNOSIS — M545 Low back pain: Secondary | ICD-10-CM | POA: Diagnosis not present

## 2016-08-27 DIAGNOSIS — M545 Low back pain: Secondary | ICD-10-CM | POA: Diagnosis not present

## 2016-09-04 DIAGNOSIS — M545 Low back pain: Secondary | ICD-10-CM | POA: Diagnosis not present

## 2016-09-14 ENCOUNTER — Other Ambulatory Visit: Payer: Self-pay | Admitting: Obstetrics and Gynecology

## 2016-10-02 ENCOUNTER — Other Ambulatory Visit: Payer: Self-pay | Admitting: Obstetrics and Gynecology

## 2016-10-02 DIAGNOSIS — Z1231 Encounter for screening mammogram for malignant neoplasm of breast: Secondary | ICD-10-CM

## 2016-10-10 ENCOUNTER — Telehealth: Payer: Self-pay | Admitting: Internal Medicine

## 2016-10-10 NOTE — Telephone Encounter (Signed)
Pt called request to speak to the assistant concern about severe leg cramp that she had last night. Please give her a call back.

## 2016-10-11 NOTE — Telephone Encounter (Signed)
Agree Try Tylenol pm, magnesium prn OV if not well Thx

## 2016-10-11 NOTE — Telephone Encounter (Signed)
Called pt and she states that she is not experiencing any cramping now. She states that her legs are sore.   Offered pt an appt to come in.

## 2016-10-15 NOTE — Telephone Encounter (Signed)
Left message advising patient of dr plotnikov's additional note/instructions--call back if any questions

## 2016-10-19 DIAGNOSIS — M545 Low back pain: Secondary | ICD-10-CM | POA: Diagnosis not present

## 2016-10-26 ENCOUNTER — Encounter: Payer: Self-pay | Admitting: Internal Medicine

## 2016-10-26 ENCOUNTER — Ambulatory Visit (INDEPENDENT_AMBULATORY_CARE_PROVIDER_SITE_OTHER): Payer: Commercial Managed Care - HMO | Admitting: Internal Medicine

## 2016-10-26 ENCOUNTER — Other Ambulatory Visit (INDEPENDENT_AMBULATORY_CARE_PROVIDER_SITE_OTHER): Payer: Commercial Managed Care - HMO

## 2016-10-26 DIAGNOSIS — G8929 Other chronic pain: Secondary | ICD-10-CM

## 2016-10-26 DIAGNOSIS — M5440 Lumbago with sciatica, unspecified side: Secondary | ICD-10-CM

## 2016-10-26 LAB — BASIC METABOLIC PANEL
BUN: 15 mg/dL (ref 6–23)
CALCIUM: 9.6 mg/dL (ref 8.4–10.5)
CO2: 28 mEq/L (ref 19–32)
Chloride: 103 mEq/L (ref 96–112)
Creatinine, Ser: 0.92 mg/dL (ref 0.40–1.20)
GFR: 86.73 mL/min (ref >60.00)
Glucose, Bld: 86 mg/dL (ref 70–99)
Potassium: 4.2 mEq/L (ref 3.5–5.1)
SODIUM: 136 meq/L (ref 135–145)

## 2016-10-26 LAB — MAGNESIUM: MAGNESIUM: 2.1 mg/dL (ref 1.5–2.5)

## 2016-10-26 NOTE — Progress Notes (Signed)
Pre-visit discussion using our clinic review tool. No additional management support is needed unless otherwise documented below in the visit note.  

## 2016-10-26 NOTE — Progress Notes (Signed)
Subjective:  Patient ID: Angel [name], female    DOB: Apr 21, 1976  Age: 41 y.o. MRN: [id number]  CC: Follow-up (back pain- she said its okay.PT since december 2017) and Dizziness (bp was low )   HPI Angel [name] presents for LBP, insomnia, GERD f/u In PT  Outpatient Medications Prior to Visit  Medication Sig Dispense Refill  . clindamycin (CLINDAGEL) 1 % gel Apply 1 application topically daily.   11  . cyclobenzaprine (FLEXERIL) 5 MG tablet Take 1 tablet (5 mg total) by mouth at bedtime. 30 tablet 3  . hydrocortisone (ANUSOL-HC) 25 MG suppository Place 1 suppository (25 mg total) rectally 2 (two) times daily as needed for hemorrhoids or itching. 20 suppository 0  . ibuprofen (ADVIL,MOTRIN) 600 MG tablet 1 po  pc  every 6 hours for 5 days then as needed for pain 30 tablet 1  . pantoprazole (PROTONIX) 40 MG tablet Take 1 tablet (40 mg total) by mouth daily. 90 tablet 3  . polyethylene glycol powder (GLYCOLAX/MIRALAX) powder Take 17 g by mouth 2 (two) times daily as needed for mild constipation or moderate constipation. 500 g 1  . prochlorperazine (COMPAZINE) 10 MG tablet Take 1 tablet (10 mg total) by mouth every 6 (six) hours as needed for nausea or vomiting. 30 tablet 1  . spironolactone (ALDACTONE) 50 MG tablet Take 50 mg by mouth daily.  3  . tretinoin (RETIN-A) 0.025 % cream Apply 1 application topically at bedtime.   11  . Vitamin D, Ergocalciferol, (DRISDOL) 50000 units CAPS capsule Take 1 capsule (50,000 Units total) by mouth once a week. 12 capsule 1  . gabapentin (NEURONTIN) 300 MG capsule Take 300 mg by mouth daily. Reported on 01/19/2016  0  . oxyCODONE-acetaminophen (PERCOCET/ROXICET) 5-325 MG tablet Take 1-2 tablets by mouth every 6 (six) hours as needed for severe pain. 30 tablet 0  . traMADol (ULTRAM) 50 MG tablet Take 1-2 tablets (50-100 mg total) by mouth every 12 (twelve) hours as needed. 60 tablet 1   No facility-administered medications prior to visit.      ROS Review of Systems  Constitutional: Negative for activity change, appetite change, chills, fatigue and unexpected weight change.  HENT: Negative for congestion, mouth sores and sinus pressure.   Eyes: Negative for visual disturbance.  Respiratory: Negative for cough and chest tightness.   Gastrointestinal: Negative for abdominal pain and nausea.  Genitourinary: Negative for difficulty urinating, frequency and vaginal pain.  Musculoskeletal: Positive for back pain. Negative for gait problem.  Skin: Negative for pallor and rash.  Neurological: Negative for dizziness, tremors, weakness, numbness and headaches.  Psychiatric/Behavioral: Negative for confusion and sleep disturbance. The patient is not nervous/anxious.     Objective:  BP 108/70   Pulse 80   Temp 97.7 F (36.5 C) (Oral)   Resp 16   Ht 5\' 6"  (1.676 m)   Wt 201 lb (91.2 kg)   SpO2 98%   BMI 32.44 kg/m   BP Readings from Last 3 Encounters:  10/26/16 108/70  07/20/16 (!) 90/58  07/12/16 110/78    Wt Readings from Last 3 Encounters:  10/26/16 201 lb (91.2 kg)  07/20/16 197 lb (89.4 kg)  07/12/16 195 lb (88.5 kg)    Physical Exam  Constitutional: She appears well-developed. No distress.  HENT:  Head: Normocephalic.  Right Ear: External ear normal.  Left Ear: External ear normal.  Nose: Nose normal.  Mouth/Throat: Oropharynx is clear and moist.  Eyes: Conjunctivae are normal. Pupils are  equal, round, and reactive to light. Right eye exhibits no discharge. Left eye exhibits no discharge.  Neck: Normal range of motion. Neck supple. No JVD present. No tracheal deviation present. No thyromegaly present.  Cardiovascular: Normal rate, regular rhythm and normal heart sounds.   Pulmonary/Chest: No stridor. No respiratory distress. She has no wheezes.  Abdominal: Soft. Bowel sounds are normal. She exhibits no distension and no mass. There is no tenderness. There is no rebound and no guarding.  Musculoskeletal: She  exhibits tenderness. She exhibits no edema.  Lymphadenopathy:    She has no cervical adenopathy.  Neurological: She displays normal reflexes. No cranial nerve deficit. She exhibits normal muscle tone.  Skin: No rash noted. No erythema.  Psychiatric: She has a normal mood and affect. Her behavior is normal. Judgment and thought content normal.  LS tender Obese  Lab Results  Component Value Date   WBC 4.8 01/19/2016   HGB 12.9 01/19/2016   HCT 38.7 01/19/2016   PLT 281.0 01/19/2016   GLUCOSE 92 01/19/2016   CHOL 189 01/19/2016   TRIG 46.0 01/19/2016   HDL 73.40 01/19/2016   LDLCALC 106 (H) 01/19/2016   ALT 10 01/19/2016   AST 16 01/19/2016   NA 138 01/19/2016   K 4.3 01/19/2016   CL 103 01/19/2016   CREATININE 0.84 01/19/2016   BUN 13 01/19/2016   CO2 29 01/19/2016   TSH 1.30 01/19/2016   HGBA1C 5.6 10/18/2014    Dg Hip Unilat W Or W/o Pelvis 2-3 Views Right  Result Date: 07/12/2016 CLINICAL DATA:  Hip pain status post a fall 1 month ago EXAM: DG HIP (WITH OR WITHOUT PELVIS) 2-3V RIGHT COMPARISON:  Coronal and sagittal images from an abdominal and pelvic CT scan of June 02, 2015 FINDINGS: The bony pelvis is subjectively adequately mineralized. There is no lytic or blastic lesion. No acute or old pelvic fracture is observed. AP and lateral views of the right hip reveal mild symmetric narrowing of the joint space. The articular surfaces of the femoral head and acetabulum remains smoothly rounded. The femoral neck, intertrochanteric, and subtrochanteric regions are normal. The soft tissues are unremarkable. IMPRESSION: Mild narrowing of the right hip joint space consistent with early osteoarthritis. No acute fracture nor dislocation. Electronically Signed   By: David  [place] M.D.   On: 07/12/2016 09:15    Assessment & Plan:   There are no diagnoses linked to this encounter. I have discontinued Ms. Schaper's gabapentin, oxyCODONE-acetaminophen, and traMADol. I am also having her  maintain her polyethylene glycol powder, clindamycin, spironolactone, tretinoin, prochlorperazine, pantoprazole, hydrocortisone, ibuprofen, Vitamin D (Ergocalciferol), and cyclobenzaprine.  No orders of the defined types were placed in this encounter.    Follow-up: No Follow-up on file.  Sonda PrimesAlex Plotnikov, MD

## 2016-10-26 NOTE — Assessment & Plan Note (Signed)
Labs

## 2016-10-30 DIAGNOSIS — M545 Low back pain: Secondary | ICD-10-CM | POA: Diagnosis not present

## 2016-11-26 ENCOUNTER — Encounter: Payer: Self-pay | Admitting: Nurse Practitioner

## 2016-11-26 ENCOUNTER — Ambulatory Visit (INDEPENDENT_AMBULATORY_CARE_PROVIDER_SITE_OTHER): Payer: Commercial Managed Care - HMO | Admitting: Nurse Practitioner

## 2016-11-26 VITALS — BP 100/68 | HR 68 | Temp 98.3°F | Ht 66.0 in | Wt 202.0 lb

## 2016-11-26 DIAGNOSIS — K644 Residual hemorrhoidal skin tags: Secondary | ICD-10-CM

## 2016-11-26 MED ORDER — IBUPROFEN 800 MG PO TABS: 800.0000 mg | ORAL_TABLET | Freq: Three times a day (TID) | ORAL | 0 refills | Status: DC | PRN

## 2016-11-26 MED ORDER — HYDROCORTISONE ACETATE 25 MG RE SUPP: 25.0000 mg | Freq: Two times a day (BID) | RECTAL | 0 refills | Status: DC | PRN

## 2016-11-26 NOTE — Patient Instructions (Addendum)
Hemorrhoids Hemorrhoids are swollen veins in and around the rectum or anus. There are two types of hemorrhoids:  Internal hemorrhoids. These occur in the veins that are just inside the rectum. They may poke through to the outside and become irritated and painful.  External hemorrhoids. These occur in the veins that are outside of the anus and can be felt as a painful swelling or hard lump near the anus. Most hemorrhoids do not cause serious problems, and they can be managed with home treatments such as diet and lifestyle changes. If home treatments do not help your symptoms, procedures can be done to shrink or remove the hemorrhoids. What are the causes? This condition is caused by increased pressure in the anal area. This pressure may result from various things, including:  Constipation.  Straining to have a bowel movement.  Diarrhea.  Pregnancy.  Obesity.  Sitting for long periods of time.  Heavy lifting or other activity that causes you to strain.  Anal sex. What are the signs or symptoms? Symptoms of this condition include:  Pain.  Anal itching or irritation.  Rectal bleeding.  Leakage of stool (feces).  Anal swelling.  One or more lumps around the anus. How is this diagnosed? This condition can often be diagnosed through a visual exam. Other exams or tests may also be done, such as:  Examination of the rectal area with a gloved hand (digital rectal exam).  Examination of the anal canal using a small tube (anoscope).  A blood test, if you have lost a significant amount of blood.  A test to look inside the colon (sigmoidoscopy or colonoscopy). How is this treated? This condition can usually be treated at home. However, various procedures may be done if dietary changes, lifestyle changes, and other home treatments do not help your symptoms. These procedures can help make the hemorrhoids smaller or remove them completely. Some of these procedures involve surgery,  and others do not. Common procedures include:  Rubber band ligation. Rubber bands are placed at the base of the hemorrhoids to cut off the blood supply to them.  Sclerotherapy. Medicine is injected into the hemorrhoids to shrink them.  Infrared coagulation. A type of light energy is used to get rid of the hemorrhoids.  Hemorrhoidectomy surgery. The hemorrhoids are surgically removed, and the veins that supply them are tied off.  Stapled hemorrhoidopexy surgery. A circular stapling device is used to remove the hemorrhoids and use staples to cut off the blood supply to them. Follow these instructions at home: Eating and drinking   Eat foods that have a lot of fiber in them, such as whole grains, beans, nuts, fruits, and vegetables. Ask your health care provider about taking products that have added fiber (fiber supplements).  Drink enough fluid to keep your urine clear or pale yellow. Managing pain and swelling   Take warm sitz baths for 20 minutes, 3-4 times a day to ease pain and discomfort.  If directed, apply ice to the affected area. Using ice packs between sitz baths may be helpful.  Put ice in a plastic bag.  Place a towel between your skin and the bag.  Leave the ice on for 20 minutes, 2-3 times a day. General instructions   Take over-the-counter and prescription medicines only as told by your health care provider.  Use medicated creams or suppositories as told.  Exercise regularly.  Go to the bathroom when you have the urge to have a bowel movement. Do not wait.  Avoid  straining to have bowel movements.  Keep the anal area dry and clean. Use wet toilet paper or moist towelettes after a bowel movement.  Do not sit on the toilet for long periods of time. This increases blood pooling and pain. Contact a health care provider if:  You have increasing pain and swelling that are not controlled by treatment or medicine.  You have uncontrolled bleeding.  You have  difficulty having a bowel movement, or you are unable to have a bowel movement.  You have pain or inflammation outside the area of the hemorrhoids. This information is not intended to replace advice given to you by your health care provider. Make sure you discuss any questions you have with your health care provider. Document Released: 07/27/2000 Document Revised: 12/28/2015 Document Reviewed: 04/13/2015 Elsevier Interactive Patient Education  2017 [employer].   How to Take a [employer] A sitz bath is a warm water bath that is taken while you are sitting down. The water should only come up to your hips and should cover your buttocks. Your health care provider may recommend a sitz bath to help you:  Clean the lower part of your body, including your genital area.  With itching.  With pain.  With sore muscles or muscles that tighten or spasm. How to take a sitz bath Take 3-4 sitz baths per day or as told by your health care provider. 1. Partially fill a bathtub with warm water. You will only need the water to be deep enough to cover your hips and buttocks when you are sitting in it. 2. If your health care provider told you to put medicine in the water, follow the directions exactly. 3. Sit in the water and open the tub drain a little. 4. Turn on the warm water again to keep the tub at the correct level. Keep the water running constantly. 5. Soak in the water for 15-20 minutes or as told by your health care provider. 6. After the sitz bath, pat the affected area dry first. Do not rub it. 7. Be careful when you stand up after the sitz bath because you may feel dizzy. Contact a health care provider if:  Your symptoms get worse. Do not continue with sitz baths if your symptoms get worse.  You have new symptoms. Do not continue with sitz baths until you talk with your health care provider. This information is not intended to replace advice given to you by your health care provider. Make  sure you discuss any questions you have with your health care provider. Document Released: 04/21/2004 Document Revised: 12/28/2015 Document Reviewed: 07/28/2014 Elsevier Interactive Patient Education  2017 [employer].

## 2016-11-26 NOTE — Progress Notes (Signed)
Pre visit review using our clinic review tool, if applicable. No additional management support is needed unless otherwise documented below in the visit note. 

## 2016-11-26 NOTE — Progress Notes (Signed)
Subjective:  Patient ID: Angel [name], female    DOB: 08/18/1975  Age: 41 y.o. MRN: [id number]  CC: Hemorrhoids (painful hemorrhoid for 2 days, --no student please)   HPI  Rectal Pain: Presents with rectal pain x 3days. She thinks it is related to hemorrhoid. No rectal bleeding or mucus per rectum, no ABD pain, no fever, no N/V, no constipation or diarrhea.  Outpatient Medications Prior to Visit  Medication Sig Dispense Refill  . clindamycin (CLINDAGEL) 1 % gel Apply 1 application topically daily.   11  . cyclobenzaprine (FLEXERIL) 5 MG tablet Take 1 tablet (5 mg total) by mouth at bedtime. 30 tablet 3  . pantoprazole (PROTONIX) 40 MG tablet Take 1 tablet (40 mg total) by mouth daily. 90 tablet 3  . polyethylene glycol powder (GLYCOLAX/MIRALAX) powder Take 17 g by mouth 2 (two) times daily as needed for mild constipation or moderate constipation. 500 g 1  . prochlorperazine (COMPAZINE) 10 MG tablet Take 1 tablet (10 mg total) by mouth every 6 (six) hours as needed for nausea or vomiting. 30 tablet 1  . spironolactone (ALDACTONE) 50 MG tablet Take 50 mg by mouth daily.  3  . tretinoin (RETIN-A) 0.025 % cream Apply 1 application topically at bedtime.   11  . hydrocortisone (ANUSOL-HC) 25 MG suppository Place 1 suppository (25 mg total) rectally 2 (two) times daily as needed for hemorrhoids or itching. 20 suppository 0  . ibuprofen (ADVIL,MOTRIN) 600 MG tablet 1 po  pc  every 6 hours for 5 days then as needed for pain 30 tablet 1  . Vitamin D, Ergocalciferol, (DRISDOL) 50000 units CAPS capsule Take 1 capsule (50,000 Units total) by mouth once a week. (Patient not taking: Reported on 11/26/2016) 12 capsule 1   No facility-administered medications prior to visit.     ROS See HPI  Objective:  BP 100/68   Pulse 68   Temp 98.3 F (36.8 C)   Ht  (1.676 m)   Wt 202 lb (91.6 kg)   SpO2 99%   BMI 32.60 kg/m   BP Readings from Last 3 Encounters:  11/26/16 100/68  10/26/16  108/70  07/20/16 (!) 90/58    Wt Readings from Last 3 Encounters:  11/26/16 202 lb (91.6 kg)  10/26/16 201 lb (91.2 kg)  07/20/16 197 lb (89.4 kg)    Physical Exam  Constitutional: She is oriented to person, place, and time.  Pulmonary/Chest: Effort normal and breath sounds normal.  Abdominal: Soft.  Genitourinary: Rectal exam shows external hemorrhoid and tenderness. Rectal exam shows no internal hemorrhoid and anal tone normal.  Genitourinary Comments: Exam performed with anoscope.   Neurological: She is alert and oriented to person, place, and time.  Vitals reviewed.   Lab Results  Component Value Date   WBC 4.8 01/19/2016   HGB 12.9 01/19/2016   HCT 38.7 01/19/2016   PLT 281.0 01/19/2016   GLUCOSE 86 10/26/2016   CHOL 189 01/19/2016   TRIG 46.0 01/19/2016   HDL 73.40 01/19/2016   LDLCALC 106 (H) 01/19/2016   ALT 10 01/19/2016   AST 16 01/19/2016   NA 136 10/26/2016   K 4.2 10/26/2016   CL 103 10/26/2016   CREATININE 0.92 10/26/2016   BUN 15 10/26/2016   CO2 28 10/26/2016   TSH 1.30 01/19/2016   HGBA1C 5.6 10/18/2014    Dg Hip Unilat W Or W/o Pelvis 2-3 Views Right  Result Date: 07/12/2016 CLINICAL DATA:  Hip pain status post a fall  1 month ago EXAM: DG HIP (WITH OR WITHOUT PELVIS) 2-3V RIGHT COMPARISON:  Coronal and sagittal images from an abdominal and pelvic CT scan of June 02, 2015 FINDINGS: The bony pelvis is subjectively adequately mineralized. There is no lytic or blastic lesion. No acute or old pelvic fracture is observed. AP and lateral views of the right hip reveal mild symmetric narrowing of the joint space. The articular surfaces of the femoral head and acetabulum remains smoothly rounded. The femoral neck, intertrochanteric, and subtrochanteric regions are normal. The soft tissues are unremarkable. IMPRESSION: Mild narrowing of the right hip joint space consistent with early osteoarthritis. No acute fracture nor dislocation. Electronically Signed    By: David  [place] M.D.   On: 07/12/2016 09:15    Assessment & Plan:   Yardley was seen today for hemorrhoids.  Diagnoses and all orders for this visit:  Inflamed external hemorrhoid -     hydrocortisone (ANUSOL-HC) 25 MG suppository; Place 1 suppository (25 mg total) rectally 2 (two) times daily as needed for hemorrhoids or itching. -     ibuprofen (ADVIL,MOTRIN) 800 MG tablet; Take 1 tablet (800 mg total) by mouth every 8 (eight) hours as needed.   I have discontinued Ms. Brazee's ibuprofen. I am also having her start on ibuprofen. Additionally, I am having her maintain her polyethylene glycol powder, clindamycin, spironolactone, tretinoin, prochlorperazine, pantoprazole, Vitamin D (Ergocalciferol), cyclobenzaprine, and hydrocortisone.  Meds ordered this encounter  Medications  . hydrocortisone (ANUSOL-HC) 25 MG suppository    Sig: Place 1 suppository (25 mg total) rectally 2 (two) times daily as needed for hemorrhoids or itching.    Dispense:  20 suppository    Refill:  0    Order Specific Question:   Supervising Provider    Answer:   Tresa Garter [[id number]]  . ibuprofen (ADVIL,MOTRIN) 800 MG tablet    Sig: Take 1 tablet (800 mg total) by mouth every 8 (eight) hours as needed.    Dispense:  30 tablet    Refill:  0    Order Specific Question:   Supervising Provider    Answer:   Tresa Garter [[id number]]    Follow-up: No Follow-up on file.  Alysia Penna, NP

## 2017-01-01 DIAGNOSIS — Z131 Encounter for screening for diabetes mellitus: Secondary | ICD-10-CM | POA: Diagnosis not present

## 2017-01-01 DIAGNOSIS — Z79899 Other long term (current) drug therapy: Secondary | ICD-10-CM | POA: Diagnosis not present

## 2017-01-01 DIAGNOSIS — E559 Vitamin D deficiency, unspecified: Secondary | ICD-10-CM | POA: Diagnosis not present

## 2017-01-01 DIAGNOSIS — R5383 Other fatigue: Secondary | ICD-10-CM | POA: Diagnosis not present

## 2017-01-01 DIAGNOSIS — R0602 Shortness of breath: Secondary | ICD-10-CM | POA: Diagnosis not present

## 2017-01-01 DIAGNOSIS — R5381 Other malaise: Secondary | ICD-10-CM | POA: Diagnosis not present

## 2017-01-02 DIAGNOSIS — Z01419 Encounter for gynecological examination (general) (routine) without abnormal findings: Secondary | ICD-10-CM | POA: Diagnosis not present

## 2017-01-02 DIAGNOSIS — Z124 Encounter for screening for malignant neoplasm of cervix: Secondary | ICD-10-CM | POA: Diagnosis not present

## 2017-01-10 ENCOUNTER — Ambulatory Visit
Admission: RE | Admit: 2017-01-10 | Discharge: 2017-01-10 | Disposition: A | Discharge status: Home / Self Care | Payer: Commercial Managed Care - HMO | Source: Ambulatory Visit | Attending: Obstetrics and Gynecology | Admitting: Obstetrics and Gynecology

## 2017-01-10 DIAGNOSIS — Z1231 Encounter for screening mammogram for malignant neoplasm of breast: Secondary | ICD-10-CM

## 2017-01-14 DIAGNOSIS — Z719 Counseling, unspecified: Secondary | ICD-10-CM | POA: Diagnosis not present

## 2017-01-16 DIAGNOSIS — L700 Acne vulgaris: Secondary | ICD-10-CM | POA: Diagnosis not present

## 2017-01-23 DIAGNOSIS — Z719 Counseling, unspecified: Secondary | ICD-10-CM | POA: Diagnosis not present

## 2017-01-29 DIAGNOSIS — E7800 Pure hypercholesterolemia, unspecified: Secondary | ICD-10-CM | POA: Diagnosis not present

## 2017-01-29 DIAGNOSIS — Z79899 Other long term (current) drug therapy: Secondary | ICD-10-CM | POA: Diagnosis not present

## 2017-01-30 DIAGNOSIS — Z719 Counseling, unspecified: Secondary | ICD-10-CM | POA: Diagnosis not present

## 2017-02-11 DIAGNOSIS — Z719 Counseling, unspecified: Secondary | ICD-10-CM | POA: Diagnosis not present

## 2017-02-20 DIAGNOSIS — Z719 Counseling, unspecified: Secondary | ICD-10-CM | POA: Diagnosis not present

## 2017-02-27 DIAGNOSIS — Z719 Counseling, unspecified: Secondary | ICD-10-CM | POA: Diagnosis not present

## 2017-02-27 DIAGNOSIS — N183 Chronic kidney disease, stage 3 (moderate): Secondary | ICD-10-CM | POA: Diagnosis not present

## 2017-02-27 DIAGNOSIS — E7800 Pure hypercholesterolemia, unspecified: Secondary | ICD-10-CM | POA: Diagnosis not present

## 2017-03-07 DIAGNOSIS — Z719 Counseling, unspecified: Secondary | ICD-10-CM | POA: Diagnosis not present

## 2017-03-20 DIAGNOSIS — Z719 Counseling, unspecified: Secondary | ICD-10-CM | POA: Diagnosis not present

## 2017-03-27 DIAGNOSIS — Z719 Counseling, unspecified: Secondary | ICD-10-CM | POA: Diagnosis not present

## 2017-04-05 ENCOUNTER — Encounter: Payer: Self-pay | Admitting: Family Medicine

## 2017-04-05 ENCOUNTER — Ambulatory Visit (INDEPENDENT_AMBULATORY_CARE_PROVIDER_SITE_OTHER): Payer: 59 | Admitting: Family Medicine

## 2017-04-05 VITALS — BP 110/70 | HR 59 | Temp 98.4°F | Ht 66.0 in | Wt 191.0 lb

## 2017-04-05 DIAGNOSIS — E669 Obesity, unspecified: Secondary | ICD-10-CM | POA: Insufficient documentation

## 2017-04-05 DIAGNOSIS — M545 Low back pain, unspecified: Secondary | ICD-10-CM

## 2017-04-05 DIAGNOSIS — Z23 Encounter for immunization: Secondary | ICD-10-CM | POA: Diagnosis not present

## 2017-04-05 NOTE — Assessment & Plan Note (Addendum)
Acute on Chronic. This appears to be more muscular in nature. She has had several different interventions with no improvement. - Try manipulation follow-up with Dr. Katrinka Blazing - Can follow up for a trigger point injection - Advised to obtain rigid soled insole and try toe spacer

## 2017-04-05 NOTE — Patient Instructions (Addendum)
Thank you for coming in,   Please try obtaining some rigid soled orthotics. You can try a toe spacer for your right foot as well. You can make an appointment with Dr. Katrinka Blazing in a 30 minute appointment for manipulation. You can follow up with me for a trigger point injection if you would like to try that.    Please feel free to call with any questions or concerns at any time, at [phone]. --Dr. Jordan Likes

## 2017-04-05 NOTE — Progress Notes (Signed)
Angel [name] - 41 y.o. female MRN [id number]  Date of birth: 01/01/1976  SUBJECTIVE:  Including CC & ROS.  Chief Complaint  Patient presents with  . Back Pain    Continued back pain patient states she has done PT and it helped a little bit but pain still came back. patient states she has not been able to sleep and it really tired   [name] is a 41 year old female is presenting for acute on chronic low back pain. This is right sided with some radiation that wraps around to her proximal hip. There is nothing that radiates down her leg in a radicular fashion. This pain is worse after she sleeps areas lying on her back at night. The pain seems to improve after she is moving for some time. She has taken several different medications in the form of gabapentin, tramadol, Flexeril, and narcotic with limited improvement. The muscle relaxer seems to help the most. She's also tried several topical ointments. She has been seen at [facility]. She has had SI joint injections and radiofrequency ablation of L3-L5 with no improvement of her pain. The pain is causing her inability to sleep.   She was seen by Dr. Posey Rea on 10/26/16 for back pain. Electrolytes ordered during that visit are normal. She had a lumbar MRI performed on 08/03/13. I have independently reviewed the MRI which shows a small disc herniation at L4-L5. She had an MRI performed at [facility] on 09/06/15 that showed partial involution of the left foraminal disc protrusion at L4-5 without definite residual nerve root encroachment as well as lower lumbar spine facet degenerative changes may contribute to back pain, worse on right at L4-5. X-ray of her lumbar spine from 03/22/15 shows mild degenerative disc disease at L5-S1.  Review of Systems  Musculoskeletal: Positive for back pain. Negative for gait problem and joint swelling.  Skin: Negative for color change.  Neurological: Negative for weakness and numbness.    Hematological: Negative for adenopathy.    HISTORY: Past Medical, Surgical, Social, and Family History Reviewed & Updated per EMR.   Pertinent Historical Findings include:  Past Medical History:  Diagnosis Date  . Breast mass 06/2013   bilateral  . GERD (gastroesophageal reflux disease) 08/2014   takes Protonix daily  . Herniated disc   . Migraine headache   . Sickle cell trait (HCC)   . Vitamin D deficiency 2009    Past Surgical History:  Procedure Laterality Date  . BILATERAL SALPINGECTOMY  09/26/2015   Procedure: BILATERAL SALPINGECTOMY;  Surgeon: Osborn Coho, MD;  Location: [facility];  Service: Gynecology;;  . BREAST BIOPSY Bilateral 07/13/2013   Procedure: BILATERAL EXCISION BREAS MASSES;  Surgeon: Shelly Rubenstein, MD;  Location: [facility];  Service: General;  Laterality: Bilateral;  . DE QUERVAIN'S RELEASE Right 09/06/2003  . DILITATION & CURRETTAGE/HYSTROSCOPY WITH NOVASURE ABLATION N/A 09/26/2015   Procedure: DILATATION & CURETTAGE/HYSTEROSCOPY WITH NOVASURE ABLATION;  Surgeon: Osborn Coho, MD;  Location: [facility];  Service: Gynecology;  Laterality: N/A;  . LAPAROSCOPIC OVARIAN CYSTECTOMY Right 09/26/2015   Procedure: LAPAROSCOPIC Right OVARIAN CYSTECTOMY, Bilateral Peritoneal Biopsies over Uteralsacral area;  Surgeon: Osborn Coho, MD;  Location: [facility];  Service: Gynecology;  Laterality: Right;  . TUBAL LIGATION  12/27/2005  . WISDOM TOOTH EXTRACTION    . WRIST GANGLION EXCISION Right 09/06/2003  . WRIST GANGLION EXCISION Left 09/04/1999    No Known Allergies  Family History  Problem Relation Age of Onset  .  Hypertension Father   . Diabetes Father   . Heart disease Father   . Cancer Father        prostate  . Sickle cell anemia Daughter   . Sickle cell anemia Son      Social History   Social History  . Marital status: Married    Spouse name: N/A  . Number of children: 2  . Years of education: N/A   Occupational History  . Not on file.    Social History Main Topics  . Smoking status: Never Smoker  . Smokeless tobacco: Never Used  . Alcohol use No  . Drug use: No  . Sexual activity: Yes    Birth control/ protection: Surgical     Comment: BTL   Other Topics Concern  . Not on file   Social History Narrative  . No narrative on file     PHYSICAL EXAM:  VS: BP 110/70 (BP Location: Left Arm, Patient Position: Sitting, Cuff Size: Normal)   Pulse (!) 59   Temp 98.4 F (36.9 C) (Oral)   Ht 5\' 6"  (1.676 m)   Wt 191 lb (86.6 kg)   SpO2 98%   BMI 30.83 kg/m  Physical Exam Gen: NAD, alert, cooperative with exam, well-appearing ENT: normal lips, normal nasal mucosa,  Eye: normal EOM, normal conjunctiva and lids CV:  no edema, +2 pedal pulses   Resp: no accessory muscle use, non-labored,  Skin: no rashes, no areas of induration  Neuro: normal tone, normal sensation to touch Psych:  normal insight, alert and oriented MSK:  Back: Some tenderness to palpation over the right SI joint. No tenderness to palpation over the greater trochanter or piriformis. Normal internal and external rotation of the hip. Normal strength with hip flexion. Some reproduction of pain with straight leg raise but very mild in nature. Normal knee flexion and extension. Has some rotation and hallux valgus formation of the right great toe. Some weakness with hip abduction on the right. Neurovascular intact      ASSESSMENT & PLAN:   I spent 25 minutes with this patient, greater than 50% was face-to-face time counseling regarding the below diagnosis.   Obesity (BMI 30-39.9) Likely contributing to her back pain. She is actively trying to lose weight but is difficult with the back pain - Counseled on aquatic therapy  Low back pain Acute on Chronic. This appears to be more muscular in nature. She has had several different interventions with no improvement. - Try manipulation follow-up with Dr. Katrinka Blazing - Can follow up for a trigger point  injection - Advised to obtain rigid soled insole and try toe spacer

## 2017-04-05 NOTE — Assessment & Plan Note (Signed)
Likely contributing to her back pain. She is actively trying to lose weight but is difficult with the back pain - Counseled on aquatic therapy

## 2017-04-16 ENCOUNTER — Ambulatory Visit (INDEPENDENT_AMBULATORY_CARE_PROVIDER_SITE_OTHER): Payer: 59 | Admitting: Family Medicine

## 2017-04-16 ENCOUNTER — Encounter: Payer: Self-pay | Admitting: Family Medicine

## 2017-04-16 ENCOUNTER — Telehealth: Payer: Self-pay | Admitting: Internal Medicine

## 2017-04-16 VITALS — BP 108/60 | HR 87 | Temp 97.7°F | Ht 66.0 in | Wt 193.0 lb

## 2017-04-16 DIAGNOSIS — M545 Low back pain, unspecified: Secondary | ICD-10-CM

## 2017-04-16 DIAGNOSIS — M542 Cervicalgia: Secondary | ICD-10-CM | POA: Diagnosis not present

## 2017-04-16 MED ORDER — MELOXICAM 15 MG PO TABS: 15.0000 mg | ORAL_TABLET | Freq: Every day | ORAL | 1 refills | Status: DC | PRN

## 2017-04-16 NOTE — Assessment & Plan Note (Signed)
We will try a trigger point injection today. She does have sickle cell trait so unclear if this could be related. All other imaging and modalities have not relieved her pain.  - trigger point injection  - mobic sent. Continue flexeril  - will see Dr. Katrinka BlazingSmith for manipulation  - if no improvement can consider getting a CK.

## 2017-04-16 NOTE — Progress Notes (Signed)
Angel [name] - 41 y.o. female MRN [id number]  Date of birth: Jan 09, 1976  SUBJECTIVE:  Including CC & ROS.  Chief Complaint  Patient presents with  . Neck Pain    started thursday patient she is not sure if its from matress or pillow states her neck is stiff and has limited motion. pain is radiating to shoulders  . Injections    trigger poin injection lower back   Angel [name] is a 41 year old female and is presenting for an acute neck pain and following up for her low back pain. She reports neck pain started over the course of this past weekend. She was driving to [facility] and felt a during the drive. This is occurring in the bilateral trapezius. It is pulling down on the back of her neck and causing her to have a headache. She has been taking Flexeril with limited improvement. She denies any injury. She does report increased stress with her son having a facial fracture last week.  Reports no significant change in her ongoing back pain. She has tried several forms of therapy with limited improvement. She would like to try a trigger point injection today.   She was seen by me on 04/05/17.   I have tentatively reviewed the MRI from 08/03/13 that shows a small disc herniation at L4-L5. She had an MRI performed at [facility] on 09/05/12 that showed partial involution of the left foraminal disc protrusion at L4-5 without definite residual nerve root encroachment as well as lower lumbar spine facet degenerative changes may contribute to back pain, worse on right at L4-5. X-ray of her lumbar spine from 03/22/15 shows mild degenerative disc disease at L5-S1.  Review of Systems  Musculoskeletal: Positive for back pain and neck pain. Negative for gait problem.  Skin: Negative for color change.  otherwise negative   HISTORY: Past Medical, Surgical, Social, and Family History Reviewed & Updated per EMR.   Pertinent Historical Findings include:  Past Medical History:  Diagnosis Date  . Breast mass  06/2013   bilateral  . GERD (gastroesophageal reflux disease) 08/2014   takes Protonix daily  . Herniated disc   . Migraine headache   . Sickle cell trait (HCC)   . Vitamin D deficiency 2009    Past Surgical History:  Procedure Laterality Date  . BILATERAL SALPINGECTOMY  09/26/2015   Procedure: BILATERAL SALPINGECTOMY;  Surgeon: Osborn Coho, MD;  Location: [facility];  Service: Gynecology;;  . BREAST BIOPSY Bilateral 07/13/2013   Procedure: BILATERAL EXCISION BREAS MASSES;  Surgeon: Shelly Rubenstein, MD;  Location: [facility];  Service: General;  Laterality: Bilateral;  . DE QUERVAIN'S RELEASE Right 09/06/2003  . DILITATION & CURRETTAGE/HYSTROSCOPY WITH NOVASURE ABLATION N/A 09/26/2015   Procedure: DILATATION & CURETTAGE/HYSTEROSCOPY WITH NOVASURE ABLATION;  Surgeon: Osborn Coho, MD;  Location: [facility];  Service: Gynecology;  Laterality: N/A;  . LAPAROSCOPIC OVARIAN CYSTECTOMY Right 09/26/2015   Procedure: LAPAROSCOPIC Right OVARIAN CYSTECTOMY, Bilateral Peritoneal Biopsies over Uteralsacral area;  Surgeon: Osborn Coho, MD;  Location: [facility];  Service: Gynecology;  Laterality: Right;  . TUBAL LIGATION  12/27/2005  . WISDOM TOOTH EXTRACTION    . WRIST GANGLION EXCISION Right 09/06/2003  . WRIST GANGLION EXCISION Left 09/04/1999    No Known Allergies  Family History  Problem Relation Age of Onset  . Hypertension Father   . Diabetes Father   . Heart disease Father   . Cancer Father        prostate  . Sickle cell  anemia Daughter   . Sickle cell anemia Son      Social History   Social History  . Marital status: Married    Spouse name: N/A  . Number of children: 2  . Years of education: N/A   Occupational History  . Not on file.   Social History Main Topics  . Smoking status: Never Smoker  . Smokeless tobacco: Never Used  . Alcohol use No  . Drug use: No  . Sexual activity: Yes    Birth control/ protection: Surgical     Comment: BTL   Other Topics  Concern  . Not on file   Social History Narrative  . No narrative on file     PHYSICAL EXAM:  VS: BP 108/60 (BP Location: Left Arm, Patient Position: Sitting, Cuff Size: Normal)   Pulse 87   Temp 97.7 F (36.5 C) (Oral)   Ht 5\' 6"  (1.676 m)   Wt 193 lb (87.5 kg)   SpO2 100%   BMI 31.15 kg/m  Physical Exam Gen: NAD, alert, cooperative with exam, well-appearing ENT: normal lips, normal nasal mucosa,  Eye: normal EOM, normal conjunctiva and lids CV:  no edema, +2 pedal pulses   Resp: no accessory muscle use, non-labored,  Skin: no rashes, no areas of induration  Neuro: normal tone, normal sensation to touch Psych:  normal insight, alert and oriented MSK:  Neck: Normal neck range of motion. Tenderness to palpation on the left and right trapezius. No tenderness to palpation over the cervical midline spine. Normal shrug strength.    Aspiration/Injection Procedure Note [name] [birth year]  Procedure: Injection Indications: Right lower back trigger point injection  Procedure Details Consent: Risks of procedure as well as the alternatives and risks of each were explained to the (patient/caregiver).  Consent for procedure obtained. Time Out: Verified patient identification, verified procedure, site/side was marked, verified correct patient position, special equipment/implants available, medications/allergies/relevent history reviewed, required imaging and test results available.  Performed.  The area was cleaned with iodine and alcohol swabs.    The right lower back trigger point was injected using 1 cc's of 40 mg Depomedrol and 4 cc's of 1% lidocaine with a 25 1 1/2" needle.  Ultrasound was used. Images were obtained in Long views showing the injection.    A sterile dressing was applied.  Patient did tolerate procedure well.       ASSESSMENT & PLAN:   Low back pain We will try a trigger point injection today. She does have sickle cell trait so unclear if this  could be related. All other imaging and modalities have not relieved her pain.  - trigger point injection  - mobic sent. Continue flexeril  - will see Dr. Katrinka BlazingSmith for manipulation  - if no improvement can consider getting a CK.   Neck pain Seems to be muscular strain in nature. Had significant stress recently related to her son.  - added mobic  - continue flexeril  - had limited time to PT  - provided HEP

## 2017-04-16 NOTE — Patient Instructions (Addendum)
Thank you for coming in,   Please let me know if you don't have any improvement with the injection   Please try the home exercises.   Please try different other the counter muscles rubs as well.    Please feel free to call with any questions or concerns at any time, at [phone]. --Dr. Jordan LikesSchmitz

## 2017-04-16 NOTE — Assessment & Plan Note (Signed)
Seems to be muscular strain in nature. Had significant stress recently related to her son.  - added mobic  - continue flexeril  - had limited time to PT  - provided HEP

## 2017-04-16 NOTE — Telephone Encounter (Signed)
Error.  Pt needed appt

## 2017-05-03 ENCOUNTER — Ambulatory Visit (INDEPENDENT_AMBULATORY_CARE_PROVIDER_SITE_OTHER): Payer: 59 | Admitting: Internal Medicine

## 2017-05-03 ENCOUNTER — Encounter: Payer: Self-pay | Admitting: Internal Medicine

## 2017-05-03 ENCOUNTER — Other Ambulatory Visit (INDEPENDENT_AMBULATORY_CARE_PROVIDER_SITE_OTHER): Payer: 59

## 2017-05-03 DIAGNOSIS — R140 Abdominal distension (gaseous): Secondary | ICD-10-CM | POA: Diagnosis not present

## 2017-05-03 DIAGNOSIS — E669 Obesity, unspecified: Secondary | ICD-10-CM | POA: Diagnosis not present

## 2017-05-03 DIAGNOSIS — Z0001 Encounter for general adult medical examination with abnormal findings: Secondary | ICD-10-CM

## 2017-05-03 DIAGNOSIS — Z0000 Encounter for general adult medical examination without abnormal findings: Secondary | ICD-10-CM | POA: Diagnosis not present

## 2017-05-03 DIAGNOSIS — M545 Low back pain, unspecified: Secondary | ICD-10-CM

## 2017-05-03 LAB — URINALYSIS
Bilirubin Urine: NEGATIVE
Hgb urine dipstick: NEGATIVE
Ketones, ur: NEGATIVE
LEUKOCYTES UA: NEGATIVE
Nitrite: NEGATIVE
PH: 7 (ref 5.0–8.0)
SPECIFIC GRAVITY, URINE: 1.01 (ref 1.000–1.030)
Total Protein, Urine: NEGATIVE
UROBILINOGEN UA: 0.2 (ref 0.0–1.0)
Urine Glucose: NEGATIVE

## 2017-05-03 LAB — HEPATIC FUNCTION PANEL
ALK PHOS: 44 U/L (ref 39–117)
ALT: 12 U/L (ref 0–35)
AST: 12 U/L (ref 0–37)
Albumin: 4.1 g/dL (ref 3.5–5.2)
BILIRUBIN DIRECT: 0.1 mg/dL (ref 0.0–0.3)
TOTAL PROTEIN: 6.9 g/dL (ref 6.0–8.3)
Total Bilirubin: 0.4 mg/dL (ref 0.2–1.2)

## 2017-05-03 LAB — BASIC METABOLIC PANEL
BUN: 15 mg/dL (ref 6–23)
CALCIUM: 9.3 mg/dL (ref 8.4–10.5)
CHLORIDE: 106 meq/L (ref 96–112)
CO2: 26 meq/L (ref 19–32)
CREATININE: 0.9 mg/dL (ref 0.40–1.20)
GFR: 88.73 mL/min (ref >60.00)
Glucose, Bld: 90 mg/dL (ref 70–99)
Potassium: 4.2 mEq/L (ref 3.5–5.1)
Sodium: 139 mEq/L (ref 135–145)

## 2017-05-03 LAB — CBC WITH DIFFERENTIAL/PLATELET
BASOS ABS: 0 10*3/uL (ref 0.0–0.1)
BASOS PCT: 0.7 % (ref 0.0–3.0)
EOS ABS: 0.1 10*3/uL (ref 0.0–0.7)
Eosinophils Relative: 1.5 % (ref 0.0–5.0)
HEMATOCRIT: 39.7 % (ref 36.0–46.0)
Hemoglobin: 13 g/dL (ref 12.0–15.0)
LYMPHS ABS: 1.5 10*3/uL (ref 0.7–4.0)
LYMPHS PCT: 29.2 % (ref 12.0–46.0)
MCHC: 32.7 g/dL (ref 30.0–36.0)
MCV: 91.4 fl (ref 78.0–100.0)
Monocytes Absolute: 0.4 10*3/uL (ref 0.1–1.0)
Monocytes Relative: 8.3 % (ref 3.0–12.0)
NEUTROS ABS: 3.1 10*3/uL (ref 1.4–7.7)
NEUTROS PCT: 60.3 % (ref 43.0–77.0)
PLATELETS: 227 10*3/uL (ref 150.0–400.0)
RBC: 4.34 Mil/uL (ref 3.87–5.11)
RDW: 13.5 % (ref 11.5–15.5)
WBC: 5.1 10*3/uL (ref 4.0–10.5)

## 2017-05-03 LAB — LIPID PANEL
CHOL/HDL RATIO: 2
Cholesterol: 179 mg/dL (ref 0–200)
HDL: 74.5 mg/dL (ref >39.00)
LDL Cholesterol: 96 mg/dL (ref 0–99)
NONHDL: 104.6
TRIGLYCERIDES: 45 mg/dL (ref 0.0–149.0)
VLDL: 9 mg/dL (ref 0.0–40.0)

## 2017-05-03 LAB — TSH: TSH: 1.94 u[IU]/mL (ref 0.35–4.50)

## 2017-05-03 MED ORDER — VITAMIN D3 50 MCG (2000 UT) PO CAPS: 2000.0000 [IU] | ORAL_CAPSULE | Freq: Every day | ORAL | 3 refills | Status: DC

## 2017-05-03 NOTE — Assessment & Plan Note (Signed)
Will ref to Dr Beasley 

## 2017-05-03 NOTE — Progress Notes (Addendum)
Subjective:  Patient ID: Angel [name], female    DOB: [birth year]  Age: 41 y.o. MRN: [id number]  CC: No chief complaint on file.   HPI Angel [name] presents for a well exam/labs. F/u LBP - better w/a shot, R bunion. Using Meloxicam C/o being overweight. C/o occ abd bloating - ?triggers  Outpatient Medications Prior to Visit  Medication Sig Dispense Refill  . clindamycin (CLINDAGEL) 1 % gel Apply 1 application topically daily.   11  . cyclobenzaprine (FLEXERIL) 5 MG tablet Take 1 tablet (5 mg total) by mouth at bedtime. 30 tablet 3  . hydrocortisone (ANUSOL-HC) 25 MG suppository Place 1 suppository (25 mg total) rectally 2 (two) times daily as needed for hemorrhoids or itching. 20 suppository 0  . ibuprofen (ADVIL,MOTRIN) 800 MG tablet Take 1 tablet (800 mg total) by mouth every 8 (eight) hours as needed. 30 tablet 0  . meloxicam (MOBIC) 15 MG tablet Take 1 tablet (15 mg total) by mouth daily as needed for pain. 30 tablet 1  . pantoprazole (PROTONIX) 40 MG tablet Take 1 tablet (40 mg total) by mouth daily. 90 tablet 3  . polyethylene glycol powder (GLYCOLAX/MIRALAX) powder Take 17 g by mouth 2 (two) times daily as needed for mild constipation or moderate constipation. 500 g 1  . prochlorperazine (COMPAZINE) 10 MG tablet Take 1 tablet (10 mg total) by mouth every 6 (six) hours as needed for nausea or vomiting. 30 tablet 1  . spironolactone (ALDACTONE) 50 MG tablet Take 50 mg by mouth daily.  3  . tretinoin (RETIN-A) 0.025 % cream Apply 1 application topically at bedtime.   11  . Vitamin D, Ergocalciferol, (DRISDOL) 50000 units CAPS capsule Take 1 capsule (50,000 Units total) by mouth once a week. 12 capsule 1   No facility-administered medications prior to visit.     ROS Review of Systems  Constitutional: Negative for activity change, appetite change, chills, fatigue and unexpected weight change.  HENT: Negative for congestion, mouth sores and sinus pressure.   Eyes: Negative  for visual disturbance.  Respiratory: Negative for cough and chest tightness.   Gastrointestinal: Negative for abdominal pain and nausea.  Genitourinary: Negative for difficulty urinating, frequency and vaginal pain.  Musculoskeletal: Positive for back pain. Negative for gait problem.  Skin: Negative for pallor and rash.  Neurological: Negative for dizziness, tremors, weakness, numbness and headaches.  Psychiatric/Behavioral: Negative for confusion and sleep disturbance. The patient is nervous/anxious.     Objective:  BP 108/64 (BP Location: Left Arm, Patient Position: Sitting, Cuff Size: Large)   Pulse 67   Temp 98.4 F (36.9 C) (Oral)   Ht  (1.676 m)   Wt 194 lb (88 kg)   SpO2 99%   BMI 31.31 kg/m   BP Readings from Last 3 Encounters:  05/03/17 108/64  04/16/17 108/60  04/05/17 110/70    Wt Readings from Last 3 Encounters:  05/03/17 194 lb (88 kg)  04/16/17 193 lb (87.5 kg)  04/05/17 191 lb (86.6 kg)    Physical Exam  Constitutional: She appears well-developed. No distress.  HENT:  Head: Normocephalic.  Right Ear: External ear normal.  Left Ear: External ear normal.  Nose: Nose normal.  Mouth/Throat: Oropharynx is clear and moist.  Eyes: Pupils are equal, round, and reactive to light. Conjunctivae are normal. Right eye exhibits no discharge. Left eye exhibits no discharge.  Neck: Normal range of motion. Neck supple. No JVD present. No tracheal deviation present. No thyromegaly present.  Cardiovascular:  Normal rate, regular rhythm and normal heart sounds.   Pulmonary/Chest: No stridor. No respiratory distress. She has no wheezes.  Abdominal: Soft. Bowel sounds are normal. She exhibits no distension and no mass. There is no tenderness. There is no rebound and no guarding.  Musculoskeletal: She exhibits no edema or tenderness.  Lymphadenopathy:    She has no cervical adenopathy.  Neurological: She displays normal reflexes. No cranial nerve deficit. She exhibits  normal muscle tone. Coordination normal.  Skin: No rash noted. No erythema.  Psychiatric: She has a normal mood and affect. Her behavior is normal. Judgment and thought content normal.  R bunion Obese  Lab Results  Component Value Date   WBC 4.8 01/19/2016   HGB 12.9 01/19/2016   HCT 38.7 01/19/2016   PLT 281.0 01/19/2016   GLUCOSE 86 10/26/2016   CHOL 189 01/19/2016   TRIG 46.0 01/19/2016   HDL 73.40 01/19/2016   LDLCALC 106 (H) 01/19/2016   ALT 10 01/19/2016   AST 16 01/19/2016   NA 136 10/26/2016   K 4.2 10/26/2016   CL 103 10/26/2016   CREATININE 0.92 10/26/2016   BUN 15 10/26/2016   CO2 28 10/26/2016   TSH 1.30 01/19/2016   HGBA1C 5.6 10/18/2014    Mm Screening Breast Tomo Bilateral  Result Date: 01/10/2017 CLINICAL DATA:  Screening. EXAM: 2D DIGITAL SCREENING BILATERAL MAMMOGRAM WITH CAD AND ADJUNCT TOMO COMPARISON:  Previous exam(s). ACR Breast Density Category c: The breast tissue is heterogeneously dense, which may obscure small masses. FINDINGS: There are no findings suspicious for malignancy. Images were processed with CAD. IMPRESSION: No mammographic evidence of malignancy. A result letter of this screening mammogram will be mailed directly to the patient. RECOMMENDATION: Screening mammogram in one year. (Code:SM-B-01Y) BI-RADS CATEGORY  1: Negative. Electronically Signed   By: Frederico Hamman M.D.   On: 01/10/2017 13:10    Assessment & Plan:   There are no diagnoses linked to this encounter. I am having Ms. Dome maintain her polyethylene glycol powder, clindamycin, spironolactone, tretinoin, prochlorperazine, pantoprazole, Vitamin D (Ergocalciferol), cyclobenzaprine, hydrocortisone, ibuprofen, and meloxicam.  No orders of the defined types were placed in this encounter.    Follow-up: No Follow-up on file.  Sonda Primes, MD

## 2017-05-03 NOTE — Assessment & Plan Note (Signed)
We discussed age appropriate health related issues, including available/recomended screening tests and vaccinations. We discussed a need for adhering to healthy diet and exercise. Labs were ordered to be later reviewed . All questions were answered.   

## 2017-05-03 NOTE — Assessment & Plan Note (Signed)
Better  

## 2017-05-03 NOTE — Assessment & Plan Note (Signed)
gluten free diet GI ref if needed

## 2017-05-03 NOTE — Patient Instructions (Signed)
  Gluten free trial for 4-6 weeks. OK to use gluten-free bread and gluten-free pasta.    Gluten-Free Diet for Celiac Disease, Adult The gluten-free diet includes all foods that do not contain gluten. Gluten is a protein that is found in wheat, rye, barley, and some other grains. Following the gluten-free diet is the only treatment for people with celiac disease. It helps to prevent damage to the intestines and improves or eliminates the symptoms of celiac disease. Following the gluten-free diet requires some planning. It can be challenging at first, but it gets easier with time and practice. There are more gluten-free options available today than ever before. If you need help finding gluten-free foods or if you have questions, talk with your diet and nutrition specialist (registered dietitian) or your health care provider. What do I need to know about a gluten-free diet?  All fruits, vegetables, and meats are safe to eat and do not contain gluten.  When grocery shopping, start by shopping in the produce, meat, and dairy sections. These sections are more likely to contain gluten-free foods. Then move to the aisles that contain packaged foods if you need to.  Read all food labels. Gluten is often added to foods. Always check the ingredient list and look for warnings, such as "may contain gluten."  Talk with your dietitian or health care provider before taking a gluten-free multivitamin or mineral supplement.  Be aware of gluten-free foods having contact with foods that contain gluten (cross-contamination). This can happen at home and with any processed foods. ? Talk with your health care provider or dietitian about how to reduce the risk of cross-contamination in your home. ? If you have questions about how a food is processed, ask the manufacturer. What key words help to identify gluten? Foods that list any of these key words on the label usually contain gluten:  Wheat, flour, enriched  flour, bromated flour, white flour, durum flour, graham flour, phosphated flour, self-rising flour, semolina, farina, barley (malt), rye, and oats.  Starch, dextrin, modified food starch, or cereal.  Thickening, fillers, or emulsifiers.  Malt flavoring, malt extract, or malt syrup.  Hydrolyzed vegetable protein.  In the [place] packaged foods that are gluten-free are required to be labeled "GF." These foods should be easy to identify and are safe to eat. In the [place] food companies are also required to list common food allergens, including wheat, on their labels. Recommended foods Grains  Amaranth, bean flours, 100% buckwheat flour, corn, millet, nut flours or nut meals, GF oats, quinoa, rice, sorghum, teff, rice wafers, pure cornmeal tortillas, popcorn, and hot cereals made from cornmeal. Hominy, rice, wild rice. Some Asian rice noodles or bean noodles. Arrowroot starch, corn bran, corn flour, corn germ, cornmeal, corn starch, potato flour, potato starch flour, and rice bran. Plain, brown, and sweet rice flours. Rice polish, soy flour, and tapioca starch. Vegetables  All plain fresh, frozen, and canned vegetables. Fruits  All plain fresh, frozen, canned, and dried fruits, and 100% fruit juices. Meats and other protein foods  All fresh beef, pork, poultry, fish, seafood, and eggs. Fish canned in water, oil, brine, or vegetable broth. Plain nuts and seeds, peanut butter. Some lunch meat and some frankfurters. Dried beans, dried peas, and lentils. Dairy  Fresh plain, dry, evaporated, or condensed milk. Cream, butter, sour cream, whipping cream, and most yogurts. Unprocessed cheese, most processed cheeses, some cottage cheese, some cream cheeses. Beverages  Coffee, tea, most herbal teas. Carbonated beverages and some root beers.   Wine, sake, and distilled spirits, such as gin, vodka, and whiskey. Most hard ciders. Fats and oils  Butter, margarine, vegetable oil, hydrogenated butter, olive  oil, shortening, lard, cream, and some mayonnaise. Some commercial salad dressings. Olives. Sweets and desserts  Sugar, honey, some syrups, molasses, jelly, and jam. Plain hard candy, marshmallows, and gumdrops. Pure cocoa powder. Plain chocolate. Custard and some pudding mixes. Gelatin desserts, sorbets, frozen ice pops, and sherbet. Cake, cookies, and other desserts prepared with allowed flours. Some commercial ice creams. Cornstarch, tapioca, and rice puddings. Seasoning and other foods  Some canned or frozen soups. Monosodium glutamate (MSG). Cider, rice, and wine vinegar. Baking soda and baking powder. Cream of tartar. Baking and nutritional yeast. Certain soy sauces made without wheat (ask your dietitian about specific brands that are allowed). Nuts, coconut, and chocolate. Salt, pepper, herbs, spices, flavoring extracts, imitation or artificial flavorings, natural flavorings, and food colorings. Some medicines and supplements. Some lip glosses and other cosmetics. Rice syrups. The items listed may not be a complete list. Talk with your dietitian about what dietary choices are best for you. Foods to avoid Grains  Barley, bran, bulgur, couscous, cracked wheat, [place], farro, graham, malt, matzo, semolina, wheat germ, and all wheat and rye cereals including spelt and kamut. Cereals containing malt as a flavoring, such as rice cereal. Noodles, spaghetti, macaroni, most packaged rice mixes, and all mixes containing wheat, rye, barley, or triticale. Vegetables  Most creamed vegetables and most vegetables canned in sauces. Some commercially prepared vegetables and salads. Fruits  Thickened or prepared fruits and some pie fillings. Some fruit snacks and fruit roll-ups. Meats and other protein foods  Any meat or meat alternative containing wheat, rye, barley, or gluten stabilizers. These are often marinated or packaged meats and lunch meats. Bread-containing products, such as Swiss steak,  croquettes, meatballs, and meatloaf. Most tuna canned in vegetable broth and turkey with hydrolyzed vegetable protein (HVP) injected as part of the basting. Seitan. Imitation fish. Eggs in sauces made from ingredients to avoid. Dairy  Commercial chocolate milk drinks and malted milk. Some non-dairy creamers. Any cheese product containing ingredients to avoid. Beverages  Certain cereal beverages. Beer, ale, malted milk, and some root beers. Some hard ciders. Some instant flavored coffees. Some herbal teas made with barley or with barley malt added. Fats and oils  Some commercial salad dressings. Sour cream containing modified food starch. Sweets and desserts  Some toffees. Chocolate-coated nuts (may be rolled in wheat flour) and some commercial candies and candy bars. Most cakes, cookies, donuts, pastries, and other baked goods. Some commercial ice cream. Ice cream cones. Commercially prepared mixes for cakes, cookies, and other desserts. Bread pudding and other puddings thickened with flour. Products containing brown rice syrup made with barley malt enzyme. Desserts and sweets made with malt flavoring. Seasoning and other foods  Some curry powders, some dry seasoning mixes, some gravy extracts, some meat sauces, some ketchups, some prepared mustards, and horseradish. Certain soy sauces. Malt vinegar. Bouillon and bouillon cubes that contain HVP. Some chip dips, and some chewing gum. Yeast extract. Brewer's yeast. Caramel color. Some medicines and supplements. Some lip glosses and other cosmetics. The items listed may not be a complete list. Talk with your dietitian about what dietary choices are best for you. Summary  Gluten is a protein that is found in wheat, rye, barley, and some other grains. The gluten-free diet includes all foods that do not contain gluten.  If you need help finding gluten-free foods or if   you have questions, talk with your diet and nutrition specialist (registered  dietitian) or your health care provider.  Read all food labels. Gluten is often added to foods. Always check the ingredient list and look for warnings, such as "may contain gluten." This information is not intended to replace advice given to you by your health care provider. Make sure you discuss any questions you have with your health care provider. Document Released: 07/30/2005 Document Revised: 05/14/2016 Document Reviewed: 05/14/2016 Elsevier Interactive Patient Education  2018 Elsevier Inc.   

## 2017-05-04 LAB — TIQ-NTM

## 2017-05-06 LAB — HEPATITIS C ANTIBODY
Hepatitis C Ab: NONREACTIVE
SIGNAL TO CUT-OFF: 0.01 (ref <1.00)

## 2017-05-26 DIAGNOSIS — J209 Acute bronchitis, unspecified: Secondary | ICD-10-CM | POA: Diagnosis not present

## 2017-05-26 DIAGNOSIS — J019 Acute sinusitis, unspecified: Secondary | ICD-10-CM | POA: Diagnosis not present

## 2017-05-28 ENCOUNTER — Encounter (HOSPITAL_BASED_OUTPATIENT_CLINIC_OR_DEPARTMENT_OTHER): Payer: Self-pay

## 2017-05-28 ENCOUNTER — Emergency Department (HOSPITAL_BASED_OUTPATIENT_CLINIC_OR_DEPARTMENT_OTHER)
Admission: EM | Admit: 2017-05-28 | Discharge: 2017-05-28 | Disposition: A | Discharge status: Home / Self Care | Payer: 59 | Attending: Emergency Medicine | Admitting: Emergency Medicine

## 2017-05-28 ENCOUNTER — Emergency Department (HOSPITAL_BASED_OUTPATIENT_CLINIC_OR_DEPARTMENT_OTHER): Payer: 59

## 2017-05-28 DIAGNOSIS — D573 Sickle-cell trait: Secondary | ICD-10-CM | POA: Diagnosis not present

## 2017-05-28 DIAGNOSIS — Z79899 Other long term (current) drug therapy: Secondary | ICD-10-CM | POA: Diagnosis not present

## 2017-05-28 DIAGNOSIS — J189 Pneumonia, unspecified organism: Secondary | ICD-10-CM | POA: Diagnosis not present

## 2017-05-28 DIAGNOSIS — R05 Cough: Secondary | ICD-10-CM | POA: Diagnosis present

## 2017-05-28 DIAGNOSIS — R918 Other nonspecific abnormal finding of lung field: Secondary | ICD-10-CM | POA: Diagnosis not present

## 2017-05-28 DIAGNOSIS — J181 Lobar pneumonia, unspecified organism: Secondary | ICD-10-CM

## 2017-05-28 MED ORDER — ALBUTEROL SULFATE HFA 108 (90 BASE) MCG/ACT IN AERS
2.0000 | INHALATION_SPRAY | Freq: Once | RESPIRATORY_TRACT | Status: AC
  Administered 2017-05-28: 2 via RESPIRATORY_TRACT
  Filled 2017-05-28: qty 6.7

## 2017-05-28 NOTE — ED Provider Notes (Signed)
[facility] EMERGENCY DEPARTMENT Provider Note   CSN: [id number] Arrival date & time: 05/28/17  1548     History   Chief Complaint Chief Complaint  Patient presents with  . Cough    HPI Angel [name] is a 41 y.o. female.  HPI Patient ports one week of cough congestion. She feels some tightness in her chest. Her cough has been keeping up her at night. She was seen in urgent care and started on steroids as well as Levaquin and a cough suppressant reports she feels no better today that she came to the ER for evaluation. She is not significantly worse just not improving in the way that she thought she would. No fevers or chills. No history of asthma or COPD. She does not smoke cigarettes. She is been compliant with the medications including the Levaquin. No significant shortness of breath with exertion or lying flat. No unilateral leg swelling   Past Medical History:  Diagnosis Date  . Breast mass 06/2013   bilateral  . GERD (gastroesophageal reflux disease) 08/2014   takes Protonix daily  . Herniated disc   . Migraine headache   . Sickle cell trait (HCC)   . Vitamin D deficiency 2009    Patient Active Problem List   Diagnosis Date Noted  . Neck pain 04/16/2017  . Obesity (BMI 30-39.9) 04/05/2017  . Impacted cerumen of right ear 01/19/2016  . GERD (gastroesophageal reflux disease) 06/02/2015  . Elevated glucose 10/18/2014  . Boil of trunk 06/08/2014  . Pain, abdominal, RLQ 10/16/2013  . Insomnia 10/07/2013  . Mass of multiple sites of left breast 06/08/2013  . Breast mass, right 06/08/2013  . Right lumbar radiculopathy 04/22/2013  . Low back pain 06/27/2012  . Bloating symptom 04/30/2012  . Migraine headache   . Left foot pain 09/19/2011  . Situational mixed anxiety and depressive disorder 06/18/2011  . Menorrhagia 06/18/2011  . Well adult exam 12/20/2010  . FREQUENCY, URINARY 02/16/2010  . PARESTHESIA 11/01/2008  . Chest pain, unspecified 03/26/2008    . WEIGHT GAIN 12/10/2007  . Vitamin D deficiency 09/03/2007  . FATIGUE 09/03/2007  . INSOMNIA 05/31/2007  . Sickle-cell trait (HCC) 05/20/2007  . COMMON MIGRAINE 05/20/2007  . ALLERGIC RHINITIS 05/20/2007    Past Surgical History:  Procedure Laterality Date  . BILATERAL SALPINGECTOMY  09/26/2015   Procedure: BILATERAL SALPINGECTOMY;  Surgeon: Osborn Coho, MD;  Location: [facility];  Service: Gynecology;;  . BREAST BIOPSY Bilateral 07/13/2013   Procedure: BILATERAL EXCISION BREAS MASSES;  Surgeon: Shelly Rubenstein, MD;  Location: [facility];  Service: General;  Laterality: Bilateral;  . DE QUERVAIN'S RELEASE Right 09/06/2003  . DILITATION & CURRETTAGE/HYSTROSCOPY WITH NOVASURE ABLATION N/A 09/26/2015   Procedure: DILATATION & CURETTAGE/HYSTEROSCOPY WITH NOVASURE ABLATION;  Surgeon: Osborn Coho, MD;  Location: [facility];  Service: Gynecology;  Laterality: N/A;  . LAPAROSCOPIC OVARIAN CYSTECTOMY Right 09/26/2015   Procedure: LAPAROSCOPIC Right OVARIAN CYSTECTOMY, Bilateral Peritoneal Biopsies over Uteralsacral area;  Surgeon: Osborn Coho, MD;  Location: [facility];  Service: Gynecology;  Laterality: Right;  . TUBAL LIGATION  12/27/2005  . WISDOM TOOTH EXTRACTION    . WRIST GANGLION EXCISION Right 09/06/2003  . WRIST GANGLION EXCISION Left 09/04/1999    OB History    Gravida Para Term Preterm AB Living   SAB TAB Ectopic Multiple Live Births  Home Medications    Prior to Admission medications   Medication Sig Start Date End Date Taking? Authorizing Provider  Cholecalciferol (VITAMIN D3) 2000 units capsule Take 1 capsule (2,000 Units total) by mouth daily. 05/03/17   Plotnikov, Georgina Quint, MD  clindamycin (CLINDAGEL) 1 % gel Apply 1 application topically daily.  07/21/14   [provider]  cyclobenzaprine (FLEXERIL) 5 MG tablet Take 1 tablet (5 mg total) by mouth at bedtime. 07/20/16   Plotnikov, Georgina Quint, MD  hydrocortisone  (ANUSOL-HC) 25 MG suppository Place 1 suppository (25 mg total) rectally 2 (two) times daily as needed for hemorrhoids or itching. 11/26/16   Nche, Bonna Gains, NP  ibuprofen (ADVIL,MOTRIN) 800 MG tablet Take 1 tablet (800 mg total) by mouth every 8 (eight) hours as needed. 11/26/16   Nche, Bonna Gains, NP  meloxicam (MOBIC) 15 MG tablet Take 1 tablet (15 mg total) by mouth daily as needed for pain. 04/16/17   Myra Rude, MD  pantoprazole (PROTONIX) 40 MG tablet Take 1 tablet (40 mg total) by mouth daily. 06/02/15   Corwin Levins, MD  polyethylene glycol powder (GLYCOLAX/MIRALAX) powder Take 17 g by mouth 2 (two) times daily as needed for mild constipation or moderate constipation. 03/04/14   Plotnikov, Georgina Quint, MD  prochlorperazine (COMPAZINE) 10 MG tablet Take 1 tablet (10 mg total) by mouth every 6 (six) hours as needed for nausea or vomiting. 09/15/14   Etta Grandchild, MD  spironolactone (ALDACTONE) 50 MG tablet Take 50 mg by mouth daily. 08/25/14   [provider]  tretinoin (RETIN-A) 0.025 % cream Apply 1 application topically at bedtime.  09/06/14   [provider]    Family History Family History  Problem Relation Age of Onset  . Hypertension Father   . Diabetes Father   . Heart disease Father   . Cancer Father        prostate  . Sickle cell anemia Daughter   . Sickle cell anemia Son     Social History Social History  Substance Use Topics  . Smoking status: Never Smoker  . Smokeless tobacco: Never Used  . Alcohol use No     Allergies   Patient has no known allergies.   Review of Systems Review of Systems  All other systems reviewed and are negative.    Physical Exam Updated Vital Signs BP 132/66 (BP Location: Right Arm)   Pulse 89   Temp 98.9 F (37.2 C) (Oral)   Resp 16   Ht  (1.676 m)   Wt 87.1 kg (192 lb)   SpO2 100%   BMI 30.99 kg/m   Physical Exam  Constitutional: She is oriented to person, place, and time. She appears  well-developed and well-nourished. No distress.  HENT:  Head: Normocephalic and atraumatic.  Eyes: EOM are normal.  Neck: Normal range of motion.  Cardiovascular: Normal rate, regular rhythm and normal heart sounds.   Pulmonary/Chest: Effort normal and breath sounds normal.  Abdominal: Soft. She exhibits no distension. There is no tenderness.  Musculoskeletal: Normal range of motion.  Neurological: She is alert and oriented to person, place, and time.  Skin: Skin is warm and dry.  Psychiatric: She has a normal mood and affect. Judgment normal.  Nursing note and vitals reviewed.    ED Treatments / Results  Labs (all labs ordered are listed, but only abnormal results are displayed) Labs Reviewed - No data to display  EKG  EKG Interpretation None  Radiology Dg Chest 2 View  Result Date: 05/28/2017 CLINICAL DATA:  Flu-like symptoms. EXAM: CHEST  2 VIEW COMPARISON:  05/21/2018 FINDINGS: Mediastinum hilar structures are normal. Mild right middle lobe infiltrate. No pleural effusion or pneumothorax . Heart size stable. No pulmonary venous congestion. No pleural effusion or pneumothorax. IMPRESSION: Mild right mid lobe infiltrate. Electronically Signed   By: Maisie Fus  Register   On: 05/28/2017 16:27    Procedures Procedures (including critical care time)  Medications Ordered in ED Medications  albuterol (PROVENTIL HFA;VENTOLIN HFA) 108 (90 Base) MCG/ACT inhaler 2 puff (2 puffs Inhalation Given 05/28/17 1627)     Initial Impression / Assessment and Plan / ED Course  I have reviewed the triage vital signs and the nursing notes.  Pertinent labs & imaging results that were available during my care of the patient were reviewed by me and considered in my medical decision making (see chart for details).     Overall well-appearing. Mid sided infiltrate consistent with pneumonia. She is on appropriate antibiotics. I think she will improve over the next several days. She'll  continue and complete her course of Levaquin. Albuterol added to help with cough. Her vital signs are stable. No hypoxia. No indication for acute hospitalization at this point. She understands that if she were to worsen or develop new or concerning symptoms she may benefit from repeat evaluation in the emergency department and possible admission for IV antibiotics. She has the capability to return to the ER. She has transportation and resources. All questions answered  Final Clinical Impressions(s) / ED Diagnoses   Final diagnoses:  Community acquired pneumonia of right middle lobe of lung (HCC)    New Prescriptions New Prescriptions   No medications on file     Azalia Bilis, MD 05/28/17 1704

## 2017-05-28 NOTE — ED Triage Notes (Signed)
C/o flu like sx x 1 week-seen at [facility] 3 days ago-given abx, steroids and cough med-pt states she feel no better-NAD-steady gait

## 2017-06-04 ENCOUNTER — Ambulatory Visit (INDEPENDENT_AMBULATORY_CARE_PROVIDER_SITE_OTHER): Payer: 59 | Admitting: Internal Medicine

## 2017-06-04 ENCOUNTER — Encounter: Payer: Self-pay | Admitting: Internal Medicine

## 2017-06-04 DIAGNOSIS — J181 Lobar pneumonia, unspecified organism: Secondary | ICD-10-CM

## 2017-06-04 DIAGNOSIS — R5383 Other fatigue: Secondary | ICD-10-CM | POA: Diagnosis not present

## 2017-06-04 DIAGNOSIS — J189 Pneumonia, unspecified organism: Secondary | ICD-10-CM | POA: Insufficient documentation

## 2017-06-04 MED ORDER — PROMETHAZINE-CODEINE 6.25-10 MG/5ML PO SYRP: 5.0000 mL | ORAL_SOLUTION | ORAL | 0 refills | Status: DC | PRN

## 2017-06-04 NOTE — Assessment & Plan Note (Signed)
post-CAP - discussed Rest more

## 2017-06-04 NOTE — Patient Instructions (Signed)
Community-Acquired Pneumonia, Adult °Pneumonia is an infection of the lungs. There are different types of pneumonia. One type can develop while a person is in a hospital. A different type, called community-acquired pneumonia, develops in people who are not, or have not recently been, in the hospital or other health care facility. °What are the causes? °Pneumonia may be caused by bacteria, viruses, or funguses. Community-acquired pneumonia is often caused by Streptococcus pneumonia bacteria. These bacteria are often passed from one person to another by breathing in droplets from the cough or sneeze of an infected person. °What increases the risk? °The condition is more likely to develop in: °· People who have chronic diseases, such as chronic obstructive pulmonary disease (COPD), asthma, congestive heart failure, cystic fibrosis, diabetes, or kidney disease. °· People who have early-stage or late-stage HIV. °· People who have sickle cell disease. °· People who have had their spleen removed (splenectomy). °· People who have poor dental hygiene. °· People who have medical conditions that increase the risk of breathing in (aspirating) secretions their own mouth and nose. °· People who have a weakened immune system (immunocompromised). °· People who smoke. °· People who travel to areas where pneumonia-causing germs commonly exist. °· People who are around animal habitats or animals that have pneumonia-causing germs, including birds, bats, rabbits, cats, and farm animals. ° °What are the signs or symptoms? °Symptoms of this condition include: °· A dry cough. °· A wet (productive) cough. °· Fever. °· Sweating. °· Chest pain, especially when breathing deeply or coughing. °· Rapid breathing or difficulty breathing. °· Shortness of breath. °· Shaking chills. °· Fatigue. °· Muscle aches. ° °How is this diagnosed? °Your health care provider will take a medical history and perform a physical exam. You may also have other tests,  including: °· Imaging studies of your chest, including X-rays. °· Tests to check your blood oxygen level and other blood gases. °· Other tests on blood, mucus (sputum), fluid around your lungs (pleural fluid), and urine. ° °If your pneumonia is severe, other tests may be done to identify the specific cause of your illness. °How is this treated? °The type of treatment that you receive depends on many factors, such as the cause of your pneumonia, the medicines you take, and other medical conditions that you have. For most adults, treatment and recovery from pneumonia may occur at home. In some cases, treatment must happen in a hospital. Treatment may include: °· Antibiotic medicines, if the pneumonia was caused by bacteria. °· Antiviral medicines, if the pneumonia was caused by a virus. °· Medicines that are given by mouth or through an IV tube. °· Oxygen. °· Respiratory therapy. ° °Although rare, treating severe pneumonia may include: °· Mechanical ventilation. This is done if you are not breathing well on your own and you cannot maintain a safe blood oxygen level. °· Thoracentesis. This procedure removes fluid around one lung or both lungs to help you breathe better. ° °Follow these instructions at home: °· Take over-the-counter and prescription medicines only as told by your health care provider. °? Only take cough medicine if you are losing sleep. Understand that cough medicine can prevent your body’s natural ability to remove mucus from your lungs. °? If you were prescribed an antibiotic medicine, take it as told by your health care provider. Do not stop taking the antibiotic even if you start to feel better. °· Sleep in a semi-upright position at night. Try sleeping in a reclining chair, or place a few pillows under your head. °· Do not use tobacco products, including cigarettes, chewing   tobacco, and e-cigarettes. If you need help quitting, ask your health care provider. °· Drink enough water to keep your urine  clear or pale yellow. This will help to thin out mucus secretions in your lungs. °How is this prevented? °There are ways that you can decrease your risk of developing community-acquired pneumonia. Consider getting a pneumococcal vaccine if: °· You are older than 41 years of age. °· You are older than 41 years of age and are undergoing cancer treatment, have chronic lung disease, or have other medical conditions that affect your immune system. Ask your health care provider if this applies to you. ° °There are different types and schedules of pneumococcal vaccines. Ask your health care provider which vaccination option is best for you. °You may also prevent community-acquired pneumonia if you take these actions: °· Get an influenza vaccine every year. Ask your health care provider which type of influenza vaccine is best for you. °· Go to the dentist on a regular basis. °· Wash your hands often. Use hand sanitizer if soap and water are not available. ° °Contact a health care provider if: °· You have a fever. °· You are losing sleep because you cannot control your cough with cough medicine. °Get help right away if: °· You have worsening shortness of breath. °· You have increased chest pain. °· Your sickness becomes worse, especially if you are an older adult or have a weakened immune system. °· You cough up blood. °This information is not intended to replace advice given to you by your health care provider. Make sure you discuss any questions you have with your health care provider. °Document Released: 07/30/2005 Document Revised: 12/08/2015 Document Reviewed: 11/24/2014 °Elsevier Interactive Patient Education © 2017 [employer] ° °

## 2017-06-04 NOTE — Progress Notes (Signed)
Subjective:  Patient ID: Angel [name], female    DOB: Sep 30, 1975  Age: 41 y.o. MRN: [id number]  CC: No chief complaint on file.   HPI Angel RobMonica H Valadez presents for RML CAP f/u C/o fatigue, cough, tightness   Outpatient Medications Prior to Visit  Medication Sig Dispense Refill  . Cholecalciferol (VITAMIN D3) 2000 units capsule Take 1 capsule (2,000 Units total) by mouth daily. 100 capsule 3  . clindamycin (CLINDAGEL) 1 % gel Apply 1 application topically daily.   11  . cyclobenzaprine (FLEXERIL) 5 MG tablet Take 1 tablet (5 mg total) by mouth at bedtime. 30 tablet 3  . hydrocortisone (ANUSOL-HC) 25 MG suppository Place 1 suppository (25 mg total) rectally 2 (two) times daily as needed for hemorrhoids or itching. 20 suppository 0  . ibuprofen (ADVIL,MOTRIN) 800 MG tablet Take 1 tablet (800 mg total) by mouth every 8 (eight) hours as needed. 30 tablet 0  . meloxicam (MOBIC) 15 MG tablet Take 1 tablet (15 mg total) by mouth daily as needed for pain. 30 tablet 1  . pantoprazole (PROTONIX) 40 MG tablet Take 1 tablet (40 mg total) by mouth daily. 90 tablet 3  . polyethylene glycol powder (GLYCOLAX/MIRALAX) powder Take 17 g by mouth 2 (two) times daily as needed for mild constipation or moderate constipation. 500 g 1  . prochlorperazine (COMPAZINE) 10 MG tablet Take 1 tablet (10 mg total) by mouth every 6 (six) hours as needed for nausea or vomiting. 30 tablet 1  . spironolactone (ALDACTONE) 50 MG tablet Take 50 mg by mouth daily.  3  . tretinoin (RETIN-A) 0.025 % cream Apply 1 application topically at bedtime.   11   No facility-administered medications prior to visit.     ROS Review of Systems  Objective:  BP 106/78   Pulse 77   Temp 98.5 F (36.9 C) (Oral)   Ht 5\' 6"  (1.676 m)   Wt 196 lb (88.9 kg)   SpO2 99%   BMI 31.64 kg/m   BP Readings from Last 3 Encounters:  06/04/17 106/78  05/28/17 132/66  05/03/17 108/64    Wt Readings from Last 3 Encounters:  06/04/17 196  lb (88.9 kg)  05/28/17 192 lb (87.1 kg)  05/03/17 194 lb (88 kg)    Physical Exam  Lab Results  Component Value Date   WBC 5.1 05/03/2017   HGB 13.0 05/03/2017   HCT 39.7 05/03/2017   PLT 227.0 05/03/2017   GLUCOSE 90 05/03/2017   CHOL 179 05/03/2017   TRIG 45.0 05/03/2017   HDL 74.50 05/03/2017   LDLCALC 96 05/03/2017   ALT 12 05/03/2017   AST 12 05/03/2017   NA 139 05/03/2017   K 4.2 05/03/2017   CL 106 05/03/2017   CREATININE 0.90 05/03/2017   BUN 15 05/03/2017   CO2 26 05/03/2017   TSH 1.94 05/03/2017   HGBA1C 5.6 10/18/2014    Dg Chest 2 View  Result Date: 05/28/2017 CLINICAL DATA:  Flu-like symptoms. EXAM: CHEST  2 VIEW COMPARISON:  05/21/2018 FINDINGS: Mediastinum hilar structures are normal. Mild right middle lobe infiltrate. No pleural effusion or pneumothorax . Heart size stable. No pulmonary venous congestion. No pleural effusion or pneumothorax. IMPRESSION: Mild right mid lobe infiltrate. Electronically Signed   By: Maisie Fushomas  Register   On: 05/28/2017 16:27    Assessment & Plan:   There are no diagnoses linked to this encounter. I am having Ms. Irmen maintain her polyethylene glycol powder, clindamycin, spironolactone, tretinoin, prochlorperazine, pantoprazole, cyclobenzaprine,  hydrocortisone, ibuprofen, meloxicam, and Vitamin D3.  No orders of the defined types were placed in this encounter.    Follow-up: No Follow-up on file.  Sonda Primes, MD

## 2017-06-04 NOTE — Assessment & Plan Note (Signed)
Not feeling well... Stay off work x 1 week Finish Levaquin Prom - cod syr CXR in 4 weeks

## 2017-06-20 ENCOUNTER — Encounter: Payer: Self-pay | Admitting: Family Medicine

## 2017-06-20 ENCOUNTER — Ambulatory Visit: Payer: 59 | Admitting: Family Medicine

## 2017-06-20 VITALS — BP 122/78 | HR 99 | Temp 98.5°F | Ht 66.0 in | Wt 199.0 lb

## 2017-06-20 DIAGNOSIS — M357 Hypermobility syndrome: Secondary | ICD-10-CM | POA: Insufficient documentation

## 2017-06-20 DIAGNOSIS — M545 Low back pain, unspecified: Secondary | ICD-10-CM

## 2017-06-20 MED ORDER — AMITRIPTYLINE HCL 10 MG PO TABS: 10.0000 mg | ORAL_TABLET | Freq: Every day | ORAL | 1 refills | Status: DC

## 2017-06-20 NOTE — Assessment & Plan Note (Signed)
This is likely the reason why she has had failed to have any improvement with the several interventions that she's had done recently. Also as to why her imaging is fairly normal. Counseled her on the diagnosis and the ongoing treatment - Initiate Elavil - Counseled on strengthening

## 2017-06-20 NOTE — Patient Instructions (Addendum)
Thank you for coming in,   Please talk to your insurance to see what it will cost.   You can schedule an appointment with Dr. Katrinka BlazingSmith for manipulation if you'd like.   I have started elavil for your back pain. Please let me know how it does.    Please feel free to call with any questions or concerns at any time, at [phone]. --Dr. Jordan LikesSchmitz

## 2017-06-20 NOTE — Progress Notes (Signed)
Angel [name] - 41 y.o. female MRN [id number]  Date of birth: [birth year]  SUBJECTIVE:  Including CC & ROS.  Chief Complaint  Patient presents with  . Back Pain    pain is located in her lower back and extends to her right and left trochanter.  She has been using Mobic with some improvement. Pain is constant. She had pnuemonia last month, feeling better.      Angel [name] is a 41 y.o. female that is is following up for her right sided back pain. The pain is acute on chronic in nature. She reports the pain is still occurring on a frequent basis. Has had some improvement with the meloxicam. Has not been able to do many of the exercises. Reports she is improving from being hospitalized for pneumonia which has left her weak. Has been through several modalities for this pain is not had much improvement. Has had different nerve ablations, epidurals, and physical therapy with limited improvement. Has not been able to try manipulation.  Review of the MRI lumbar spine from 2014 shows a small disc herniation at L4-5.  MRI performed at [facility] in 2017 shows without definite residual nerve root encroachment as well as lower lumbar spine facet degenerative changes may contribute to back pain, worse on right at L4-5.  Review of the x-ray of her lumbar spine from 2016 shows mild degenerative disc disease at L5-S1   Review of Systems  Constitutional: Negative for fever.  Musculoskeletal: Positive for back pain. Negative for gait problem and joint swelling.  Skin: Negative for color change.  Neurological: Negative for weakness and numbness.  Hematological: Negative for adenopathy.    HISTORY: Past Medical, Surgical, Social, and Family History Reviewed & Updated per EMR.   Pertinent Historical Findings include:  Past Medical History:  Diagnosis Date  . Breast mass 06/2013   bilateral  . GERD (gastroesophageal reflux disease) 08/2014   takes Protonix daily  . Herniated disc   . Migraine  headache   . Sickle cell trait (HCC)   . Vitamin D deficiency 2009    Past Surgical History:  Procedure Laterality Date  . DE QUERVAIN'S RELEASE Right 09/06/2003  . TUBAL LIGATION  12/27/2005  . WISDOM TOOTH EXTRACTION    . WRIST GANGLION EXCISION Right 09/06/2003  . WRIST GANGLION EXCISION Left 09/04/1999    No Known Allergies  Family History  Problem Relation Age of Onset  . Hypertension Father   . Diabetes Father   . Heart disease Father   . Cancer Father        prostate  . Sickle cell anemia Daughter   . Sickle cell anemia Son      Social History   Socioeconomic History  . Marital status: Married    Spouse name: Not on file  . Number of children: 2  . Years of education: Not on file  . Highest education level: Not on file  Social Needs  . Financial resource strain: Not on file  . Food insecurity - worry: Not on file  . Food insecurity - inability: Not on file  . Transportation needs - medical: Not on file  . Transportation needs - non-medical: Not on file  Occupational History  . Not on file  Tobacco Use  . Smoking status: Never Smoker  . Smokeless tobacco: Never Used  Substance and Sexual Activity  . Alcohol use: No  . Drug use: No  . Sexual activity: Not on file    Comment: BTL  Other Topics Concern  . Not on file  Social History Narrative  . Not on file     PHYSICAL EXAM:  VS: BP 122/78 (BP Location: Left Arm, Patient Position: Sitting, Cuff Size: Normal)   Pulse 99   Temp 98.5 F (36.9 C) (Oral)   Ht 5\' 6"  (1.676 m)   Wt 199 lb (90.3 kg)   SpO2 99%   BMI 32.12 kg/m  Physical Exam Gen: NAD, alert, cooperative with exam, well-appearing ENT: normal lips, normal nasal mucosa,  Eye: normal EOM, normal conjunctiva and lids CV:  no edema, +2 pedal pulses   Resp: no accessory muscle use, non-labored,  Skin: no rashes, no areas of induration  Neuro: normal tone, normal sensation to touch Psych:  normal insight, alert and oriented MSK:  Able to  bend her some soup performed. Able to bend her pinky finger to past 90. Able to hyperextend her elbows and knees. Unable to place her hands flat on the floor. Normal back flexion and extension. Negative straight leg raise bilaterally. Neurovascular intact     ASSESSMENT & PLAN:   Hypermobility syndrome This is likely the reason why she has had failed to have any improvement with the several interventions that she's had done recently. Also as to why her imaging is fairly normal. Counseled her on the diagnosis and the ongoing treatment - Initiate Elavil - Counseled on strengthening  Low back pain Likely hypermobility as a source of her pain. Imaging is unlikely to change and would not have much benefit from ongoing injections. Could consider trigger point which could help any muscle spasm that she has underlying. - She will call her insurance and see what physical therapy is covered. We'll focus on strengthening as opposed to stretching - Can try manipulation still with Dr. Katrinka BlazingSmith - Provided Elavil. F/u 4-6 weeks

## 2017-06-20 NOTE — Assessment & Plan Note (Signed)
Likely hypermobility as a source of her pain. Imaging is unlikely to change and would not have much benefit from ongoing injections. Could consider trigger point which could help any muscle spasm that she has underlying. - She will call her insurance and see what physical therapy is covered. We'll focus on strengthening as opposed to stretching - Can try manipulation still with Dr. Katrinka BlazingSmith - Provided Elavil. F/u 4-6 weeks

## 2017-07-17 ENCOUNTER — Telehealth: Payer: Self-pay | Admitting: *Deleted

## 2017-07-17 ENCOUNTER — Other Ambulatory Visit: Payer: Self-pay | Admitting: Internal Medicine

## 2017-07-17 DIAGNOSIS — M1611 Unilateral primary osteoarthritis, right hip: Secondary | ICD-10-CM

## 2017-07-17 DIAGNOSIS — M545 Low back pain, unspecified: Secondary | ICD-10-CM

## 2017-07-17 DIAGNOSIS — W19XXXA Unspecified fall, initial encounter: Secondary | ICD-10-CM

## 2017-07-17 MED ORDER — MELOXICAM 15 MG PO TABS: 15.0000 mg | ORAL_TABLET | Freq: Every day | ORAL | 1 refills | Status: DC | PRN

## 2017-07-17 NOTE — Telephone Encounter (Signed)
RX sent to [facility]

## 2017-07-18 ENCOUNTER — Other Ambulatory Visit: Payer: Self-pay | Admitting: General Practice

## 2017-07-18 DIAGNOSIS — M545 Low back pain, unspecified: Secondary | ICD-10-CM

## 2017-07-18 DIAGNOSIS — W19XXXA Unspecified fall, initial encounter: Secondary | ICD-10-CM

## 2017-07-18 DIAGNOSIS — M1611 Unilateral primary osteoarthritis, right hip: Secondary | ICD-10-CM

## 2017-07-18 MED ORDER — CYCLOBENZAPRINE HCL 5 MG PO TABS: 5.0000 mg | ORAL_TABLET | Freq: Every day | ORAL | 3 refills | Status: DC

## 2017-08-01 DIAGNOSIS — R222 Localized swelling, mass and lump, trunk: Secondary | ICD-10-CM | POA: Diagnosis not present

## 2017-08-14 MED ORDER — AMITRIPTYLINE HCL 10 MG PO TABS: 10.0000 mg | ORAL_TABLET | Freq: Every day | ORAL | 1 refills | Status: DC

## 2017-08-14 NOTE — Addendum Note (Signed)
Addended by: Milas KocherMORRIS, Armand Preast A on: 08/14/2017 10:21 AM   Modules accepted: Orders

## 2017-08-21 DIAGNOSIS — M5020 Other cervical disc displacement, unspecified cervical region: Secondary | ICD-10-CM | POA: Insufficient documentation

## 2017-08-21 DIAGNOSIS — M5030 Other cervical disc degeneration, unspecified cervical region: Secondary | ICD-10-CM | POA: Insufficient documentation

## 2017-08-21 HISTORY — DX: Other cervical disc degeneration, unspecified cervical region: M50.30

## 2017-08-22 DIAGNOSIS — N898 Other specified noninflammatory disorders of vagina: Secondary | ICD-10-CM | POA: Diagnosis not present

## 2017-09-12 ENCOUNTER — Encounter (INDEPENDENT_AMBULATORY_CARE_PROVIDER_SITE_OTHER): Payer: Self-pay

## 2017-09-13 ENCOUNTER — Ambulatory Visit: Payer: 59 | Admitting: Family

## 2017-09-13 ENCOUNTER — Ambulatory Visit: Payer: Self-pay

## 2017-09-13 ENCOUNTER — Encounter: Payer: Self-pay | Admitting: Family

## 2017-09-13 VITALS — BP 124/80 | HR 77 | Temp 98.2°F | Ht 66.0 in | Wt 208.1 lb

## 2017-09-13 DIAGNOSIS — M79621 Pain in right upper arm: Secondary | ICD-10-CM | POA: Diagnosis not present

## 2017-09-13 DIAGNOSIS — G43001 Migraine without aura, not intractable, with status migrainosus: Secondary | ICD-10-CM | POA: Diagnosis not present

## 2017-09-13 MED ORDER — SUMATRIPTAN SUCCINATE 100 MG PO TABS: 100.0000 mg | ORAL_TABLET | ORAL | 0 refills | Status: DC | PRN

## 2017-09-13 MED ORDER — KETOROLAC TROMETHAMINE 30 MG/ML IJ SOLN
30.0000 mg | Freq: Once | INTRAMUSCULAR | Status: AC
  Administered 2017-09-13: 30 mg via INTRAMUSCULAR

## 2017-09-13 NOTE — Telephone Encounter (Signed)
   Reason for Disposition . [1] MODERATE headache (e.g., interferes with normal activities) AND [2] present > 24 hours AND [3] unexplained  (Exceptions: analgesics not tried, typical migraine, or headache part of viral illness)  Answer Assessment - Initial Assessment Questions 1. LOCATION: "Where does it hurt?"      Front 2. ONSET: "When did the headache start?" (Minutes, hours or days)      Started Monday 3. PATTERN: "Does the pain come and go, or has it been constant since it started?"     Constant 4. SEVERITY: "How bad is the pain?" and "What does it keep you from doing?"  (e.g., Scale 1-10; mild, moderate, or severe)   - MILD (1-3): doesn't interfere with normal activities    - MODERATE (4-7): interferes with normal activities or awakens from sleep    - SEVERE (8-10): excruciating pain, unable to do any normal activities        8 5. RECURRENT SYMPTOM: "Have you ever had headaches before?" If so, ask: "When was the last time?" and "What happened that time?"      Yes 6. CAUSE: "What do you think is causing the headache?"     Migraine 7. MIGRAINE: "Have you been diagnosed with migraine headaches?" If so, ask: "Is this headache similar?"      Yes 8. HEAD INJURY: "Has there been any recent injury to the head?"      No 9. OTHER SYMPTOMS: "Do you have any other symptoms?" (fever, stiff neck, eye pain, sore throat, cold symptoms)     No 10. PREGNANCY: "Is there any chance you are pregnant?" "When was your last menstrual period?"       No  Protocols used: HEADACHE-A-AH  Pt. States she had tried Imitrex "years ago, but my insurance would not pay for it." Appointment for today."

## 2017-09-13 NOTE — Progress Notes (Signed)
Angel [name] is a 42 y.o. female with the following history as recorded in [employer]:  Patient Active Problem List   Diagnosis Date Noted  . 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 CAP (community acquired pneumonia) 06/04/2017  . Fatigue 06/04/2017  . Neck pain 04/16/2017  . Obesity (BMI 30-39.9) 04/05/2017  . Impacted cerumen of right ear 01/19/2016  . GERD (gastroesophageal reflux disease) 06/02/2015  . Elevated glucose 10/18/2014  . Boil of trunk 06/08/2014  . Pain, abdominal, RLQ 10/16/2013  . Insomnia 10/07/2013  . Mass of multiple sites of left breast 06/08/2013  . Breast mass, right 06/08/2013  . Right lumbar radiculopathy 04/22/2013  . Low back pain 06/27/2012  . Bloating symptom 04/30/2012  . Migraine headache   . Left foot pain 09/19/2011  . Situational mixed anxiety and depressive disorder 06/18/2011  . Menorrhagia 06/18/2011  . Well adult exam 12/20/2010  . FREQUENCY, URINARY 02/16/2010  . PARESTHESIA 11/01/2008  . Chest pain, unspecified 03/26/2008  . WEIGHT GAIN 12/10/2007  . Vitamin D deficiency 09/03/2007  . INSOMNIA 05/31/2007  . Sickle-cell trait (HCC) 05/20/2007  . COMMON MIGRAINE 05/20/2007  . ALLERGIC RHINITIS 05/20/2007    Current Outpatient Medications  Medication Sig Dispense Refill  . Cholecalciferol (VITAMIN D3) 2000 units capsule Take 1 capsule (2,000 Units total) by mouth daily. 100 capsule 3  . clindamycin (CLINDAGEL) 1 % gel Apply 1 application topically daily.   11  . cyclobenzaprine (FLEXERIL) 5 MG tablet Take 1 tablet (5 mg total) by mouth at bedtime. 30 tablet 3  . hydrocortisone (ANUSOL-HC) 25 MG suppository Place 1 suppository (25 mg total) rectally 2 (two) times daily as needed for hemorrhoids or itching. 20 suppository 0  . ibuprofen (ADVIL,MOTRIN) 800 MG tablet Take 1 tablet (800 mg total) by mouth every 8 (eight) hours as needed. 30 tablet 0  . meloxicam (MOBIC) 15 MG tablet Take 1 tablet (15 mg total) by mouth daily as needed for  pain. 30 tablet 1  . pantoprazole (PROTONIX) 40 MG tablet Take 1 tablet (40 mg total) by mouth daily. (Patient taking differently: Take 40 mg by mouth daily as needed. ) 90 tablet 3  . spironolactone (ALDACTONE) 50 MG tablet Take 50 mg by mouth daily.  3  . amitriptyline (ELAVIL) 10 MG tablet Take 1 tablet (10 mg total) by mouth at bedtime. (Patient not taking: Reported on 09/13/2017) 30 tablet 1  . SUMAtriptan (IMITREX) 100 MG tablet Take 1 tablet (100 mg total) by mouth every 2 (two) hours as needed for migraine. May repeat in 2 hours if headache persists or recurs. 10 tablet 0   No current facility-administered medications for this visit.     Allergies: Patient has no known allergies.  Past Medical History:  Diagnosis Date  . Breast mass 06/2013   bilateral  . GERD (gastroesophageal reflux disease) 08/2014   takes Protonix daily  . Herniated disc   . Migraine headache   . Sickle cell trait (HCC)   . Vitamin D deficiency 2009    Past Surgical History:  Procedure Laterality Date  . BILATERAL SALPINGECTOMY  09/26/2015   Procedure: BILATERAL SALPINGECTOMY;  Surgeon: Osborn Coho, MD;  Location: [facility];  Service: Gynecology;;  . BREAST BIOPSY Bilateral 07/13/2013   Procedure: BILATERAL EXCISION BREAS MASSES;  Surgeon: Shelly Rubenstein, MD;  Location: [facility];  Service: General;  Laterality: Bilateral;  . DE QUERVAIN'S RELEASE Right 09/06/2003  . DILITATION & CURRETTAGE/HYSTROSCOPY WITH NOVASURE ABLATION N/A 09/26/2015   Procedure:  DILATATION & CURETTAGE/HYSTEROSCOPY WITH NOVASURE ABLATION;  Surgeon: Osborn Coho, MD;  Location: [facility];  Service: Gynecology;  Laterality: N/A;  . LAPAROSCOPIC OVARIAN CYSTECTOMY Right 09/26/2015   Procedure: LAPAROSCOPIC Right OVARIAN CYSTECTOMY, Bilateral Peritoneal Biopsies over Uteralsacral area;  Surgeon: Osborn Coho, MD;  Location: [facility];  Service: Gynecology;  Laterality: Right;  . TUBAL LIGATION  12/27/2005  . WISDOM TOOTH  EXTRACTION    . WRIST GANGLION EXCISION Right 09/06/2003  . WRIST GANGLION EXCISION Left 09/04/1999    Family History  Problem Relation Age of Onset  . Hypertension Father   . Diabetes Father   . Heart disease Father   . Cancer Father        prostate  . Sickle cell anemia Daughter   . Sickle cell anemia Son     Social History   Tobacco Use  . Smoking status: Never Smoker  . Smokeless tobacco: Never Used  Substance Use Topics  . Alcohol use: No    Subjective:  Patient presents with headache x 5 days; history of migraine headaches; notes that she is sound sensitive and light sensitive; + nausea; + vision seems a little blurry; no fever; no vomiting; no loss of consciousness; no inability to speak or talk; + fatigue- not sleeping well;   Also mentions concerns about worsening right axillar discomfort; known history of  fatty tissue deposit; last mammogram in 5/ 2018 was normal screening mammogram; last ultrasound done in 2016; denies any breast pain or nipple discharge or rash; feels pain localized in her armpit area;   Objective:  Vitals:   09/13/17 1429  BP: 124/80  Pulse: 77  Temp: 98.2 F (36.8 C)  TempSrc: Oral  SpO2: 97%  Weight: 208 lb 1.9 oz (94.4 kg)  Height: 5\' 6"  (1.676 m)    General: Well developed, well nourished, in no acute distress; prefers to have lights dimmed in the room Skin : Warm and dry.  Head: Normocephalic and atraumatic  Eyes: Sclera and conjunctiva clear; pupils round and reactive to light; extraocular movements intact  Lungs: Respirations unlabored; clear to auscultation bilaterally without wheeze, rales, rhonchi  [facility] exam: normal rate and regular rhythm.  Musculoskeletal: No deformities; no active joint inflammation  Extremities: No edema, cyanosis, clubbing  Vessels: Symmetric bilaterally  Neurologic: Alert and oriented; speech intact; face symmetrical; moves all extremities well; CNII-XII intact without focal deficit  Assessment:  1.  Migraine without aura and with status migrainosus, not intractable   2. Axillary pain, right     Plan:  1. Toradol IM 30 mg given in office today; patient has taken this in the past with no complications; she is given refill on Imitrex to have at onset of next migraine headache; she understands Imitrex will not help with this headache; if symptoms persist, would consider oral steroids; 2. Update diagnostic mammogram and ultrasound; follow-up to be determined; may need surgical consult.   No Follow-up on file.  Orders Placed This Encounter  Procedures  . MM Digital Diagnostic Unilat R    Standing Status:   Future    Standing Expiration Date:   11/12/2018    Order Specific Question:   Reason for Exam (SYMPTOM  OR DIAGNOSIS REQUIRED)    Answer:   right axillary pain;    Order Specific Question:   Is the patient pregnant?    Answer:   No    Order Specific Question:   Preferred imaging location?    Answer:   [facility]  [facility]  . US BREAST LTD UNI RIGHT INC AXILLA    Standing Status:   Future    Standing Expiration Date:   11/12/2018    Order Specific Question:   Reason for Exam (SYMPTOM  OR DIAGNOSIS REQUIRED)    Answer:   right axillary pain    Order Specific Question:   Preferred imaging location?    Answer[facility]    Requested Prescriptions   Signed Prescriptions Disp Refills  . SUMAtriptan (IMITREX) 100 MG tablet 10 tablet 0    Sig: Take 1 tablet (100 mg total) by mouth every 2 (two) hours as needed for migraine. May repeat in 2 hours if headache persists or recurs.

## 2017-09-13 NOTE — Patient Instructions (Signed)

## 2017-09-13 NOTE — Telephone Encounter (Signed)
Patient seen in office today. 

## 2017-09-27 ENCOUNTER — Other Ambulatory Visit: Payer: Self-pay | Admitting: Family

## 2017-09-27 ENCOUNTER — Ambulatory Visit
Admission: RE | Admit: 2017-09-27 | Discharge: 2017-09-27 | Disposition: A | Discharge status: Home / Self Care | Payer: 59 | Source: Ambulatory Visit | Attending: Family | Admitting: Family

## 2017-09-27 DIAGNOSIS — M79621 Pain in right upper arm: Secondary | ICD-10-CM

## 2017-09-27 DIAGNOSIS — N6002 Solitary cyst of left breast: Secondary | ICD-10-CM | POA: Diagnosis not present

## 2017-09-27 DIAGNOSIS — N6320 Unspecified lump in the left breast, unspecified quadrant: Secondary | ICD-10-CM

## 2017-09-27 DIAGNOSIS — R229 Localized swelling, mass and lump, unspecified: Secondary | ICD-10-CM | POA: Diagnosis not present

## 2017-09-27 DIAGNOSIS — R922 Inconclusive mammogram: Secondary | ICD-10-CM | POA: Diagnosis not present

## 2017-10-14 ENCOUNTER — Other Ambulatory Visit: Payer: Self-pay | Admitting: Family Medicine

## 2017-11-01 ENCOUNTER — Other Ambulatory Visit (INDEPENDENT_AMBULATORY_CARE_PROVIDER_SITE_OTHER): Payer: 59

## 2017-11-01 ENCOUNTER — Ambulatory Visit: Payer: 59 | Admitting: Internal Medicine

## 2017-11-01 ENCOUNTER — Encounter: Payer: Self-pay | Admitting: Internal Medicine

## 2017-11-01 DIAGNOSIS — M1611 Unilateral primary osteoarthritis, right hip: Secondary | ICD-10-CM

## 2017-11-01 DIAGNOSIS — W19XXXA Unspecified fall, initial encounter: Secondary | ICD-10-CM

## 2017-11-01 DIAGNOSIS — R7309 Other abnormal glucose: Secondary | ICD-10-CM | POA: Diagnosis not present

## 2017-11-01 DIAGNOSIS — L709 Acne, unspecified: Secondary | ICD-10-CM

## 2017-11-01 DIAGNOSIS — G43009 Migraine without aura, not intractable, without status migrainosus: Secondary | ICD-10-CM

## 2017-11-01 DIAGNOSIS — G4700 Insomnia, unspecified: Secondary | ICD-10-CM

## 2017-11-01 DIAGNOSIS — M545 Low back pain, unspecified: Secondary | ICD-10-CM

## 2017-11-01 LAB — HEPATIC FUNCTION PANEL
ALBUMIN: 3.8 g/dL (ref 3.5–5.2)
ALT: 13 U/L (ref 0–35)
AST: 13 U/L (ref 0–37)
Alkaline Phosphatase: 41 U/L (ref 39–117)
Bilirubin, Direct: 0.1 mg/dL (ref 0.0–0.3)
Total Bilirubin: 0.5 mg/dL (ref 0.2–1.2)
Total Protein: 6.7 g/dL (ref 6.0–8.3)

## 2017-11-01 LAB — BASIC METABOLIC PANEL
BUN: 15 mg/dL (ref 6–23)
CALCIUM: 9.1 mg/dL (ref 8.4–10.5)
CO2: 25 mEq/L (ref 19–32)
Chloride: 109 mEq/L (ref 96–112)
Creatinine, Ser: 0.85 mg/dL (ref 0.40–1.20)
GFR: 94.55 mL/min (ref >60.00)
GLUCOSE: 94 mg/dL (ref 70–99)
Potassium: 4.3 mEq/L (ref 3.5–5.1)
Sodium: 140 mEq/L (ref 135–145)

## 2017-11-01 MED ORDER — CYCLOBENZAPRINE HCL 5 MG PO TABS: 5.0000 mg | ORAL_TABLET | Freq: Every day | ORAL | 3 refills | Status: DC

## 2017-11-01 NOTE — Assessment & Plan Note (Signed)
Spironolactone

## 2017-11-01 NOTE — Assessment & Plan Note (Signed)
Off Rx 

## 2017-11-01 NOTE — Assessment & Plan Note (Signed)
Labs

## 2017-11-01 NOTE — Assessment & Plan Note (Signed)
Try CBD oil 

## 2017-11-01 NOTE — Assessment & Plan Note (Signed)
Ibuprofen prn 

## 2017-11-01 NOTE — Progress Notes (Signed)
Subjective:  Patient ID: Angel [name], female    DOB: 1975/10/07  Age: 42 y.o. MRN: [id number]  CC: No chief complaint on file.   HPI Angel RobMonica H Durney presents for migraines, LBP, insomnia. Son was sick...  Outpatient Medications Prior to Visit  Medication Sig Dispense Refill  . Cholecalciferol (VITAMIN D3) 2000 units capsule Take 1 capsule (2,000 Units total) by mouth daily. 100 capsule 3  . clindamycin (CLINDAGEL) 1 % gel Apply 1 application topically daily.   11  . cyclobenzaprine (FLEXERIL) 5 MG tablet Take 1 tablet (5 mg total) by mouth at bedtime. 30 tablet 3  . hydrocortisone (ANUSOL-HC) 25 MG suppository Place 1 suppository (25 mg total) rectally 2 (two) times daily as needed for hemorrhoids or itching. 20 suppository 0  . ibuprofen (ADVIL,MOTRIN) 800 MG tablet Take 1 tablet (800 mg total) by mouth every 8 (eight) hours as needed. 30 tablet 0  . meloxicam (MOBIC) 15 MG tablet Take 1 tablet (15 mg total) by mouth daily as needed for pain. 30 tablet 1  . pantoprazole (PROTONIX) 40 MG tablet Take 1 tablet (40 mg total) by mouth daily. (Patient taking differently: Take 40 mg by mouth daily as needed. ) 90 tablet 3  . spironolactone (ALDACTONE) 50 MG tablet Take 50 mg by mouth daily.  3  . SUMAtriptan (IMITREX) 100 MG tablet Take 1 tablet (100 mg total) by mouth every 2 (two) hours as needed for migraine. May repeat in 2 hours if headache persists or recurs. 10 tablet 0  . amitriptyline (ELAVIL) 10 MG tablet TAKE 1 TABLET BY MOUTH EVERYDAY AT BEDTIME 30 tablet 1   No facility-administered medications prior to visit.     ROS Review of Systems  Constitutional: Negative for activity change, appetite change, chills, fatigue and unexpected weight change.  HENT: Negative for congestion, mouth sores and sinus pressure.   Eyes: Negative for visual disturbance.  Respiratory: Negative for cough and chest tightness.   Gastrointestinal: Negative for abdominal pain and nausea.    Genitourinary: Negative for difficulty urinating, frequency and vaginal pain.  Musculoskeletal: Positive for arthralgias and back pain. Negative for gait problem.  Skin: Negative for pallor and rash.  Neurological: Negative for dizziness, tremors, weakness, numbness and headaches.  Psychiatric/Behavioral: Positive for sleep disturbance. Negative for confusion and suicidal ideas.    Objective:  BP 122/68 (BP Location: Left Arm, Patient Position: Sitting, Cuff Size: Large)   Pulse 72   Temp 98.6 F (37 C) (Oral)   Ht 5\' 6"  (1.676 m)   Wt 206 lb (93.4 kg)   SpO2 99%   BMI 33.25 kg/m   BP Readings from Last 3 Encounters:  11/01/17 122/68  09/13/17 124/80  06/20/17 122/78    Wt Readings from Last 3 Encounters:  11/01/17 206 lb (93.4 kg)  09/13/17 208 lb 1.9 oz (94.4 kg)  06/20/17 199 lb (90.3 kg)    Physical Exam  Constitutional: She appears well-developed. No distress.  HENT:  Head: Normocephalic.  Right Ear: External ear normal.  Left Ear: External ear normal.  Nose: Nose normal.  Mouth/Throat: Oropharynx is clear and moist.  Eyes: Pupils are equal, round, and reactive to light. Conjunctivae are normal. Right eye exhibits no discharge. Left eye exhibits no discharge.  Neck: Normal range of motion. Neck supple. No JVD present. No tracheal deviation present. No thyromegaly present.  Cardiovascular: Normal rate, regular rhythm and normal heart sounds.  Pulmonary/Chest: No stridor. No respiratory distress. She has no wheezes.  Abdominal:  Soft. Bowel sounds are normal. She exhibits no distension and no mass. There is no tenderness. There is no rebound and no guarding.  Musculoskeletal: She exhibits tenderness. She exhibits no edema.  Lymphadenopathy:    She has no cervical adenopathy.  Neurological: She displays normal reflexes. No cranial nerve deficit. She exhibits normal muscle tone. Coordination normal.  Skin: No rash noted. No erythema.  Psychiatric: She has a normal  mood and affect. Her behavior is normal. Judgment and thought content normal.    Lab Results  Component Value Date   WBC 5.1 05/03/2017   HGB 13.0 05/03/2017   HCT 39.7 05/03/2017   PLT 227.0 05/03/2017   GLUCOSE 90 05/03/2017   CHOL 179 05/03/2017   TRIG 45.0 05/03/2017   HDL 74.50 05/03/2017   LDLCALC 96 05/03/2017   ALT 12 05/03/2017   AST 12 05/03/2017   NA 139 05/03/2017   K 4.2 05/03/2017   CL 106 05/03/2017   CREATININE 0.90 05/03/2017   BUN 15 05/03/2017   CO2 26 05/03/2017   TSH 1.94 05/03/2017   HGBA1C 5.6 10/18/2014    US Breast Ltd Uni Left Inc Axilla  Result Date: 09/27/2017 CLINICAL DATA:  42 year old presenting with a palpable in the right axilla which she has noticed for approximately 2 years, recently becoming painful. She also has a palpable lump involving the inner left breast at middle to posterior depth which she initially noticed in December, 2018. Prior benign excisional biopsies of both breasts in 2014. EXAM: DIGITAL DIAGNOSTIC BILATERAL MAMMOGRAM WITH CAD AND TOMO LIMITED ULTRASOUND LEFT BREAST ULTRASOUND RIGHT AXILLA COMPARISON:  Mammography 01/10/2017, 01/10/2016 and earlier. Left breast ultrasound 06/05/2013 and earlier. Right axillary ultrasound 01/06/2015. ACR Breast Density Category c: The breast tissue is heterogeneously dense, which may obscure small masses. FINDINGS: Tomosynthesis and synthesized full field CC and MLO views of both breasts were obtained. Tomosynthesis and synthesis size spot tangential views of the site of focal pain in the right axilla and the palpable lump in the left breast were also obtained. Corresponding to the focal pain in the right axilla is a circumscribed fat density superficial mass measuring approximately 1.2 x 1.4 cm, without associated architectural distortion or suspicious calcifications. Scattered normal appearing lymph nodes are present in the right axilla elsewhere. No findings suspicious for malignancy in the right  breast. Corresponding to the area of palpable concern in the inner left breast is a benign appearing oil cyst measuring approximately 6 mm, without associated calcification or architectural distortion. Post surgical scar/architectural distortion is present in the inner left breast at posterior depth, unchanged. No findings suspicious for malignancy in the left breast. Mammographic images were processed with CAD. On physical exam, do not palpate a discrete mass or pathologic lymphadenopathy in the left axilla. The patient states mild tenderness to palpation. A mobile BB sized palpable superficial lump is present in the inner left breast corresponding to what the patient is feeling. Targeted left breast ultrasound is performed, showing an oval circumscribed parallel superficial hypoechoic mass at the 8:30 o'clock position approximately 8 cm from the nipple measuring approximately 5 x 4 x 6 mm, demonstrating posterior acoustic enhancement and no internal power Doppler flow, corresponding to the palpable concern. No suspicious solid mass or abnormal acoustic shadowing is identified. Right axillary ultrasound is performed, showing a superficial fat containing mass measuring approximately 1.7 x 0.6 x 1.4 cm corresponding to the palpable concern. Normal appearing lymph nodes are present elsewhere in the right axilla. There is no pathologic  lymphadenopathy. IMPRESSION: 1. No mammographic or sonographic evidence of malignancy involving the left breast. 2. No mammographic evidence of malignancy involving the right breast. 3. Benign 6 mm oil cyst in the superficial tissues of the inner left breast which accounts for the palpable concern. 4. Benign superficial lipoma in the right axilla which accounts for the palpable concern. No pathologic right axillary lymphadenopathy. RECOMMENDATION: Screening mammogram in one year.(Code:SM-B-01Y) I have discussed the findings and recommendations with the patient. Results were also provided  in writing at the conclusion of the visit. If applicable, a reminder letter will be sent to the patient regarding the next appointment. BI-RADS CATEGORY  2: Benign. Electronically Signed   By: Hulan Saas M.D.   On: 09/27/2017 16:38   Mm Diag Breast Tomo Bilateral  Result Date: 09/27/2017 CLINICAL DATA:  42 year old presenting with a palpable in the right axilla which she has noticed for approximately 2 years, recently becoming painful. She also has a palpable lump involving the inner left breast at middle to posterior depth which she initially noticed in December, 2018. Prior benign excisional biopsies of both breasts in 2014. EXAM: DIGITAL DIAGNOSTIC BILATERAL MAMMOGRAM WITH CAD AND TOMO LIMITED ULTRASOUND LEFT BREAST ULTRASOUND RIGHT AXILLA COMPARISON:  Mammography 01/10/2017, 01/10/2016 and earlier. Left breast ultrasound 06/05/2013 and earlier. Right axillary ultrasound 01/06/2015. ACR Breast Density Category c: The breast tissue is heterogeneously dense, which may obscure small masses. FINDINGS: Tomosynthesis and synthesized full field CC and MLO views of both breasts were obtained. Tomosynthesis and synthesis size spot tangential views of the site of focal pain in the right axilla and the palpable lump in the left breast were also obtained. Corresponding to the focal pain in the right axilla is a circumscribed fat density superficial mass measuring approximately 1.2 x 1.4 cm, without associated architectural distortion or suspicious calcifications. Scattered normal appearing lymph nodes are present in the right axilla elsewhere. No findings suspicious for malignancy in the right breast. Corresponding to the area of palpable concern in the inner left breast is a benign appearing oil cyst measuring approximately 6 mm, without associated calcification or architectural distortion. Post surgical scar/architectural distortion is present in the inner left breast at posterior depth, unchanged. No findings  suspicious for malignancy in the left breast. Mammographic images were processed with CAD. On physical exam, do not palpate a discrete mass or pathologic lymphadenopathy in the left axilla. The patient states mild tenderness to palpation. A mobile BB sized palpable superficial lump is present in the inner left breast corresponding to what the patient is feeling. Targeted left breast ultrasound is performed, showing an oval circumscribed parallel superficial hypoechoic mass at the 8:30 o'clock position approximately 8 cm from the nipple measuring approximately 5 x 4 x 6 mm, demonstrating posterior acoustic enhancement and no internal power Doppler flow, corresponding to the palpable concern. No suspicious solid mass or abnormal acoustic shadowing is identified. Right axillary ultrasound is performed, showing a superficial fat containing mass measuring approximately 1.7 x 0.6 x 1.4 cm corresponding to the palpable concern. Normal appearing lymph nodes are present elsewhere in the right axilla. There is no pathologic lymphadenopathy. IMPRESSION: 1. No mammographic or sonographic evidence of malignancy involving the left breast. 2. No mammographic evidence of malignancy involving the right breast. 3. Benign 6 mm oil cyst in the superficial tissues of the inner left breast which accounts for the palpable concern. 4. Benign superficial lipoma in the right axilla which accounts for the palpable concern. No pathologic right axillary lymphadenopathy. RECOMMENDATION:  Screening mammogram in one year.(Code:SM-B-01Y) I have discussed the findings and recommendations with the patient. Results were also provided in writing at the conclusion of the visit. If applicable, a reminder letter will be sent to the patient regarding the next appointment. BI-RADS CATEGORY  2: Benign. Electronically Signed   By: Hulan Saas M.D.   On: 09/27/2017 16:38   [place] Axilla Right  Result Date: 09/27/2017 CLINICAL DATA:  42 year old presenting  with a palpable in the right axilla which she has noticed for approximately 2 years, recently becoming painful. She also has a palpable lump involving the inner left breast at middle to posterior depth which she initially noticed in December, 2018. Prior benign excisional biopsies of both breasts in 2014. EXAM: DIGITAL DIAGNOSTIC BILATERAL MAMMOGRAM WITH CAD AND TOMO LIMITED ULTRASOUND LEFT BREAST ULTRASOUND RIGHT AXILLA COMPARISON:  Mammography 01/10/2017, 01/10/2016 and earlier. Left breast ultrasound 06/05/2013 and earlier. Right axillary ultrasound 01/06/2015. ACR Breast Density Category c: The breast tissue is heterogeneously dense, which may obscure small masses. FINDINGS: Tomosynthesis and synthesized full field CC and MLO views of both breasts were obtained. Tomosynthesis and synthesis size spot tangential views of the site of focal pain in the right axilla and the palpable lump in the left breast were also obtained. Corresponding to the focal pain in the right axilla is a circumscribed fat density superficial mass measuring approximately 1.2 x 1.4 cm, without associated architectural distortion or suspicious calcifications. Scattered normal appearing lymph nodes are present in the right axilla elsewhere. No findings suspicious for malignancy in the right breast. Corresponding to the area of palpable concern in the inner left breast is a benign appearing oil cyst measuring approximately 6 mm, without associated calcification or architectural distortion. Post surgical scar/architectural distortion is present in the inner left breast at posterior depth, unchanged. No findings suspicious for malignancy in the left breast. Mammographic images were processed with CAD. On physical exam, do not palpate a discrete mass or pathologic lymphadenopathy in the left axilla. The patient states mild tenderness to palpation. A mobile BB sized palpable superficial lump is present in the inner left breast corresponding to what  the patient is feeling. Targeted left breast ultrasound is performed, showing an oval circumscribed parallel superficial hypoechoic mass at the 8:30 o'clock position approximately 8 cm from the nipple measuring approximately 5 x 4 x 6 mm, demonstrating posterior acoustic enhancement and no internal power Doppler flow, corresponding to the palpable concern. No suspicious solid mass or abnormal acoustic shadowing is identified. Right axillary ultrasound is performed, showing a superficial fat containing mass measuring approximately 1.7 x 0.6 x 1.4 cm corresponding to the palpable concern. Normal appearing lymph nodes are present elsewhere in the right axilla. There is no pathologic lymphadenopathy. IMPRESSION: 1. No mammographic or sonographic evidence of malignancy involving the left breast. 2. No mammographic evidence of malignancy involving the right breast. 3. Benign 6 mm oil cyst in the superficial tissues of the inner left breast which accounts for the palpable concern. 4. Benign superficial lipoma in the right axilla which accounts for the palpable concern. No pathologic right axillary lymphadenopathy. RECOMMENDATION: Screening mammogram in one year.(Code:SM-B-01Y) I have discussed the findings and recommendations with the patient. Results were also provided in writing at the conclusion of the visit. If applicable, a reminder letter will be sent to the patient regarding the next appointment. BI-RADS CATEGORY  2: Benign. Electronically Signed   By: Hulan Saas M.D.   On: 09/27/2017 16:38    Assessment & Plan:  There are no diagnoses linked to this encounter. I have discontinued Raynetta H. Touhey's amitriptyline. I am also having her maintain her clindamycin, spironolactone, pantoprazole, hydrocortisone, ibuprofen, Vitamin D3, meloxicam, cyclobenzaprine, and SUMAtriptan.  No orders of the defined types were placed in this encounter.    Follow-up: No follow-ups on file.  Sonda Primes, MD

## 2017-11-26 DIAGNOSIS — M25552 Pain in left hip: Secondary | ICD-10-CM | POA: Diagnosis not present

## 2017-11-26 DIAGNOSIS — M25551 Pain in right hip: Secondary | ICD-10-CM | POA: Diagnosis not present

## 2017-11-26 DIAGNOSIS — G8929 Other chronic pain: Secondary | ICD-10-CM | POA: Diagnosis not present

## 2017-11-26 DIAGNOSIS — M545 Low back pain: Secondary | ICD-10-CM | POA: Diagnosis not present

## 2017-11-26 DIAGNOSIS — M5137 Other intervertebral disc degeneration, lumbosacral region: Secondary | ICD-10-CM | POA: Diagnosis not present

## 2017-12-06 ENCOUNTER — Ambulatory Visit: Payer: Self-pay | Admitting: *Deleted

## 2017-12-06 ENCOUNTER — Encounter: Payer: Self-pay | Admitting: Family

## 2017-12-06 ENCOUNTER — Ambulatory Visit: Payer: 59 | Admitting: Family

## 2017-12-06 VITALS — BP 116/70 | HR 85 | Temp 98.6°F | Ht 66.0 in | Wt 208.1 lb

## 2017-12-06 DIAGNOSIS — G43109 Migraine with aura, not intractable, without status migrainosus: Secondary | ICD-10-CM

## 2017-12-06 MED ORDER — KETOROLAC TROMETHAMINE 30 MG/ML IJ SOLN
30.0000 mg | Freq: Once | INTRAMUSCULAR | Status: AC
  Administered 2017-12-06: 30 mg via INTRAMUSCULAR

## 2017-12-06 MED ORDER — METHYLPREDNISOLONE 4 MG PO TBPK: ORAL_TABLET | ORAL | 0 refills | Status: DC

## 2017-12-06 NOTE — Telephone Encounter (Signed)
FYI

## 2017-12-06 NOTE — Patient Instructions (Signed)
Please start keeping a headache journal to track the true frequency of your headaches; follow-up to review and discuss treatment options in the next 3-4 weeks.  Migraine Headache A migraine headache is an intense, throbbing pain on one side or both sides of the head. Migraines may also cause other symptoms, such as nausea, vomiting, and sensitivity to light and noise. What are the causes? Doing or taking certain things may also trigger migraines, such as:  Alcohol.  Smoking.  Medicines, such as: ? Medicine used to treat chest pain (nitroglycerine). ? Birth control pills. ? Estrogen pills. ? Certain blood pressure medicines.  Aged cheeses, chocolate, or caffeine.  Foods or drinks that contain nitrates, glutamate, aspartame, or tyramine.  Physical activity.  Other things that may trigger a migraine include:  Menstruation.  Pregnancy.  Hunger.  Stress, lack of sleep, too much sleep, or fatigue.  Weather changes.  What increases the risk? The following factors may make you more likely to experience migraine headaches:  Age. Risk increases with age.  Family history of migraine headaches.  Being Caucasian.  Depression and anxiety.  Obesity.  Being a woman.  Having a hole in the heart (patent foramen ovale) or other heart problems.  What are the signs or symptoms? The main symptom of this condition is pulsating or throbbing pain. Pain may:  Happen in any area of the head, such as on one side or both sides.  Interfere with daily activities.  Get worse with physical activity.  Get worse with exposure to bright lights or loud noises.  Other symptoms may include:  Nausea.  Vomiting.  Dizziness.  General sensitivity to bright lights, loud noises, or smells.  Before you get a migraine, you may get warning signs that a migraine is developing (aura). An aura may include:  Seeing flashing lights or having blind spots.  Seeing bright spots, halos, or zigzag  lines.  Having tunnel vision or blurred vision.  Having numbness or a tingling feeling.  Having trouble talking.  Having muscle weakness.  How is this diagnosed? A migraine headache can be diagnosed based on:  Your symptoms.  A physical exam.  Tests, such as CT scan or MRI of the head. These imaging tests can help rule out other causes of headaches.  Taking fluid from the spine (lumbar puncture) and analyzing it (cerebrospinal fluid analysis, or CSF analysis).  How is this treated? A migraine headache is usually treated with medicines that:  Relieve pain.  Relieve nausea.  Prevent migraines from coming back.  Treatment may also include:  Acupuncture.  Lifestyle changes like avoiding foods that trigger migraines.  Follow these instructions at home: Medicines  Take over-the-counter and prescription medicines only as told by your health care provider.  Do not drive or use heavy machinery while taking prescription pain medicine.  To prevent or treat constipation while you are taking prescription pain medicine, your health care provider may recommend that you: ? Drink enough fluid to keep your urine clear or pale yellow. ? Take over-the-counter or prescription medicines. ? Eat foods that are high in fiber, such as fresh fruits and vegetables, whole grains, and beans. ? Limit foods that are high in fat and processed sugars, such as fried and sweet foods. Lifestyle  Avoid alcohol use.  Do not use any products that contain nicotine or tobacco, such as cigarettes and e-cigarettes. If you need help quitting, ask your health care provider.  Get at least 8 hours of sleep every night.  Limit  your stress. General instructions   Keep a journal to find out what may trigger your migraine headaches. For example, write down: ? What you eat and drink. ? How much sleep you get. ? Any change to your diet or medicines.  If you have a migraine: ? Avoid things that make your  symptoms worse, such as bright lights. ? It may help to lie down in a dark, quiet room. ? Do not drive or use heavy machinery. ? Ask your health care provider what activities are safe for you while you are experiencing symptoms.  Keep all follow-up visits as told by your health care provider. This is important. Contact a health care provider if:  You develop symptoms that are different or more severe than your usual migraine symptoms. Get help right away if:  Your migraine becomes severe.  You have a fever.  You have a stiff neck.  You have vision loss.  Your muscles feel weak or like you cannot control them.  You start to lose your balance often.  You develop trouble walking.  You faint. This information is not intended to replace advice given to you by your health care provider. Make sure you discuss any questions you have with your health care provider. Document Released: 07/30/2005 Document Revised: 02/17/2016 Document Reviewed: 01/16/2016 Elsevier Interactive Patient Education  2017 [employer].

## 2017-12-06 NOTE — Telephone Encounter (Signed)
Pt called with c/o migraines since Tuesday. She has taken Imitrex and Ibuprofen with no relief. The pain is mostly in the front close to her eyes. She can still see, some blurry.  Denies fever. Still able to do some activities.  Appointment scheduled for today per protocol. [facility]  Reason for Disposition . [1] SEVERE headache (e.g., excruciating) AND [2] not improved after 2 hours of pain medicine  Answer Assessment - Initial Assessment Questions 1. LOCATION: "Where does it hurt?"      Front part of head, close to eyes 2. ONSET: "When did the headache start?" (Minutes, hours or days)      tuesday 3. PATTERN: "Does the pain come and go, or has it been constant since it started?"     constant 4. SEVERITY: "How bad is the pain?" and "What does it keep you from doing?"  (e.g., Scale 1-10; mild, moderate, or severe)   - MILD (1-3): doesn't interfere with normal activities    - MODERATE (4-7): interferes with normal activities or awakens from sleep    - SEVERE (8-10): excruciating pain, unable to do any normal activities        Pain # 8 5. RECURRENT SYMPTOM: "Have you ever had headaches before?" If so, ask: "When was the last time?" and "What happened that time?"      Yes had headaches before. Had a shot of Toradol and Imitrex the last time and it did help some 6. CAUSE: "What do you think is causing the headache?"     Not sure 7. MIGRAINE: "Have you been diagnosed with migraine headaches?" If so, ask: "Is this headache similar?"      Yes.  8. HEAD INJURY: "Has there been any recent injury to the head?"      no 9. OTHER SYMPTOMS: "Do you have any other symptoms?" (fever, stiff neck, eye pain, sore throat, cold symptoms)     Throbbing on side of the right eye 10. PREGNANCY: "Is there any chance you are pregnant?" "When was your last menstrual period?"       No, no periods, had an ablation.  Protocols used: HEADACHE-A-AH

## 2017-12-06 NOTE — Telephone Encounter (Signed)
Call her and ask her to come on in- we can get her in this morning so she doesn't have to wait all day;

## 2017-12-06 NOTE — Telephone Encounter (Signed)
Called and spoke with patient. She will come in after she finishes up meeting today and appreciated offer to come in earlier.

## 2017-12-06 NOTE — Progress Notes (Signed)
Angel [name] is a 42 y.o. female with the following history as recorded in [employer]:  Patient Active Problem List   Diagnosis Date Noted  . Acne 11/01/2017  . 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 CAP (community acquired pneumonia) 06/04/2017  . Fatigue 06/04/2017  . Neck pain 04/16/2017  . Obesity (BMI 30-39.9) 04/05/2017  . Impacted cerumen of right ear 01/19/2016  . GERD (gastroesophageal reflux disease) 06/02/2015  . Elevated glucose 10/18/2014  . Boil of trunk 06/08/2014  . Pain, abdominal, RLQ 10/16/2013  . Insomnia 10/07/2013  . Mass of multiple sites of left breast 06/08/2013  . Breast mass, right 06/08/2013  . Right lumbar radiculopathy 04/22/2013  . Low back pain 06/27/2012  . Bloating symptom 04/30/2012  . Migraine headache   . Left foot pain 09/19/2011  . Situational mixed anxiety and depressive disorder 06/18/2011  . Menorrhagia 06/18/2011  . Well adult exam 12/20/2010  . FREQUENCY, URINARY 02/16/2010  . PARESTHESIA 11/01/2008  . Chest pain, unspecified 03/26/2008  . WEIGHT GAIN 12/10/2007  . Vitamin D deficiency 09/03/2007  . INSOMNIA 05/31/2007  . Sickle-cell trait (HCC) 05/20/2007  . Migraine without aura 05/20/2007  . ALLERGIC RHINITIS 05/20/2007    Current Outpatient Medications  Medication Sig Dispense Refill  . Cholecalciferol (VITAMIN D3) 2000 units capsule Take 1 capsule (2,000 Units total) by mouth daily. 100 capsule 3  . clindamycin (CLINDAGEL) 1 % gel Apply 1 application topically daily.   11  . cyclobenzaprine (FLEXERIL) 5 MG tablet Take 1 tablet (5 mg total) by mouth at bedtime. 30 tablet 3  . hydrocortisone (ANUSOL-HC) 25 MG suppository Place 1 suppository (25 mg total) rectally 2 (two) times daily as needed for hemorrhoids or itching. 20 suppository 0  . ibuprofen (ADVIL,MOTRIN) 800 MG tablet Take 1 tablet (800 mg total) by mouth every 8 (eight) hours as needed. 30 tablet 0  . meloxicam (MOBIC) 15 MG tablet Take 1 tablet (15 mg total) by  mouth daily as needed for pain. 30 tablet 1  . pantoprazole (PROTONIX) 40 MG tablet Take 1 tablet (40 mg total) by mouth daily. (Patient taking differently: Take 40 mg by mouth daily as needed. ) 90 tablet 3  . spironolactone (ALDACTONE) 50 MG tablet Take 50 mg by mouth daily.  3  . SUMAtriptan (IMITREX) 100 MG tablet Take 1 tablet (100 mg total) by mouth every 2 (two) hours as needed for migraine. May repeat in 2 hours if headache persists or recurs. 10 tablet 0  . terconazole (TERAZOL 7) 0.4 % vaginal cream INSERT 1 APPLICATOR(S)FUL EVERY DAY BY VAGINAL ROUTE FOR 7 DAYS.    Marland Kitchen. topiramate (TOPAMAX) 25 MG tablet topiramate 25 mg tablet    . triamcinolone cream (KENALOG) 0.1 % triamcinolone acetonide 0.1 % topical cream    . methylPREDNISolone (MEDROL DOSEPAK) 4 MG TBPK tablet Taper as directed 21 tablet 0   No current facility-administered medications for this visit.     Allergies: Patient has no known allergies.  Past Medical History:  Diagnosis Date  . Breast mass 06/2013   bilateral  . GERD (gastroesophageal reflux disease) 08/2014   takes Protonix daily  . Herniated disc   . Migraine headache   . Sickle cell trait (HCC)   . Vitamin D deficiency 2009    Past Surgical History:  Procedure Laterality Date  . BILATERAL SALPINGECTOMY  09/26/2015   Procedure: BILATERAL SALPINGECTOMY;  Surgeon: Osborn CohoAngela Roberts, MD;  Location: [facility];  Service: Gynecology;;  . BREAST BIOPSY Bilateral 07/13/2013  Procedure: BILATERAL EXCISION BREAS MASSES;  Surgeon: Shelly Rubenstein, MD;  Location: [facility];  Service: General;  Laterality: Bilateral;  . BREAST CYST EXCISION Left 2014   4 cysts removed  . DE QUERVAIN'S RELEASE Right 09/06/2003  . DILITATION & CURRETTAGE/HYSTROSCOPY WITH NOVASURE ABLATION N/A 09/26/2015   Procedure: DILATATION & CURETTAGE/HYSTEROSCOPY WITH NOVASURE ABLATION;  Surgeon: Osborn Coho, MD;  Location: [facility];  Service: Gynecology;  Laterality: N/A;  . LAPAROSCOPIC  OVARIAN CYSTECTOMY Right 09/26/2015   Procedure: LAPAROSCOPIC Right OVARIAN CYSTECTOMY, Bilateral Peritoneal Biopsies over Uteralsacral area;  Surgeon: Osborn Coho, MD;  Location: [facility];  Service: Gynecology;  Laterality: Right;  . TUBAL LIGATION  12/27/2005  . WISDOM TOOTH EXTRACTION    . WRIST GANGLION EXCISION Right 09/06/2003  . WRIST GANGLION EXCISION Left 09/04/1999    Family History  Problem Relation Age of Onset  . Hypertension Father   . Diabetes Father   . Heart disease Father   . Cancer Father        prostate  . Sickle cell anemia Daughter   . Sickle cell anemia Son     Social History   Tobacco Use  . Smoking status: Never Smoker  . Smokeless tobacco: Never Used  Substance Use Topics  . Alcohol use: No    Subjective:  Started with migraine on Tuesday pm; no relief with Imitrex/ Ibuprofen; has taken Toradol injection in the past with benefit;  LMP- ablation;  Patient complaining of light sensitivity/ "pounding in her head"; nausea; In reviewing patient's chart, patient was seen in early February, mid-march with migraine;  Objective:  Vitals:   12/06/17 1445  BP: 116/70  Pulse: 85  Temp: 98.6 F (37 C)  TempSrc: Oral  SpO2: 99%  Weight: 208 lb 1.3 oz (94.4 kg)  Height: 5\' 6"  (1.676 m)    General: Well developed, well nourished, in no acute distress; prefers to have light off  Skin : Warm and dry.  Head: Normocephalic and atraumatic  Eyes: Sclera and conjunctiva clear; pupils round and reactive to light; extraocular movements intact  Ears: External normal; canals clear; tympanic membranes normal  Oropharynx: Pink, supple. No suspicious lesions  Neck: Supple without thyromegaly, adenopathy  Lungs: Respirations unlabored; clear to auscultation bilaterally without wheeze, rales, rhonchi  Neurologic: Alert and oriented; speech intact; face symmetrical; moves all extremities well; CNII-XII intact without focal deficit   Assessment:  1. Migraine with aura and  without status migrainosus, not intractable     Plan:  Toradol IM 30 mg given in office; encouraged patient to keep headache journal and follow-up in 3-4 weeks to review; suspect patient would benefit from a preventive migraine medication like topamax; follow-up as discussed.   She is also given Rx for Medrol Dose Pak to use if the headache persists over the weekend;   No follow-ups on file.  No orders of the defined types were placed in this encounter.   Requested Prescriptions   Signed Prescriptions Disp Refills  . methylPREDNISolone (MEDROL DOSEPAK) 4 MG TBPK tablet 21 tablet 0    Sig: Taper as directed

## 2017-12-10 DIAGNOSIS — K219 Gastro-esophageal reflux disease without esophagitis: Secondary | ICD-10-CM | POA: Diagnosis not present

## 2017-12-10 DIAGNOSIS — R12 Heartburn: Secondary | ICD-10-CM | POA: Diagnosis not present

## 2018-01-08 DIAGNOSIS — M47816 Spondylosis without myelopathy or radiculopathy, lumbar region: Secondary | ICD-10-CM | POA: Diagnosis not present

## 2018-01-08 DIAGNOSIS — M5136 Other intervertebral disc degeneration, lumbar region: Secondary | ICD-10-CM | POA: Diagnosis not present

## 2018-01-08 DIAGNOSIS — M545 Low back pain: Secondary | ICD-10-CM | POA: Diagnosis not present

## 2018-01-13 DIAGNOSIS — H2513 Age-related nuclear cataract, bilateral: Secondary | ICD-10-CM | POA: Diagnosis not present

## 2018-01-13 DIAGNOSIS — H40023 Open angle with borderline findings, high risk, bilateral: Secondary | ICD-10-CM | POA: Diagnosis not present

## 2018-01-13 DIAGNOSIS — H10413 Chronic giant papillary conjunctivitis, bilateral: Secondary | ICD-10-CM | POA: Diagnosis not present

## 2018-01-20 DIAGNOSIS — M47816 Spondylosis without myelopathy or radiculopathy, lumbar region: Secondary | ICD-10-CM | POA: Diagnosis not present

## 2018-01-20 DIAGNOSIS — M545 Low back pain: Secondary | ICD-10-CM | POA: Diagnosis not present

## 2018-01-20 DIAGNOSIS — M62830 Muscle spasm of back: Secondary | ICD-10-CM | POA: Diagnosis not present

## 2018-01-21 DIAGNOSIS — Z124 Encounter for screening for malignant neoplasm of cervix: Secondary | ICD-10-CM | POA: Diagnosis not present

## 2018-01-21 DIAGNOSIS — L0292 Furuncle, unspecified: Secondary | ICD-10-CM | POA: Diagnosis not present

## 2018-01-21 DIAGNOSIS — E559 Vitamin D deficiency, unspecified: Secondary | ICD-10-CM | POA: Diagnosis not present

## 2018-01-21 DIAGNOSIS — Z01419 Encounter for gynecological examination (general) (routine) without abnormal findings: Secondary | ICD-10-CM | POA: Diagnosis not present

## 2018-01-21 DIAGNOSIS — Z1231 Encounter for screening mammogram for malignant neoplasm of breast: Secondary | ICD-10-CM | POA: Diagnosis not present

## 2018-01-21 DIAGNOSIS — Z6833 Body mass index (BMI) 33.0-33.9, adult: Secondary | ICD-10-CM | POA: Diagnosis not present

## 2018-01-21 LAB — HM PAP SMEAR: HM Pap smear: NEGATIVE

## 2018-01-23 DIAGNOSIS — M5136 Other intervertebral disc degeneration, lumbar region: Secondary | ICD-10-CM | POA: Diagnosis not present

## 2018-01-23 DIAGNOSIS — M47816 Spondylosis without myelopathy or radiculopathy, lumbar region: Secondary | ICD-10-CM | POA: Diagnosis not present

## 2018-01-24 DIAGNOSIS — M62830 Muscle spasm of back: Secondary | ICD-10-CM | POA: Diagnosis not present

## 2018-01-24 DIAGNOSIS — M545 Low back pain: Secondary | ICD-10-CM | POA: Diagnosis not present

## 2018-01-24 DIAGNOSIS — M47816 Spondylosis without myelopathy or radiculopathy, lumbar region: Secondary | ICD-10-CM | POA: Diagnosis not present

## 2018-01-27 DIAGNOSIS — M545 Low back pain: Secondary | ICD-10-CM | POA: Diagnosis not present

## 2018-01-27 DIAGNOSIS — M47816 Spondylosis without myelopathy or radiculopathy, lumbar region: Secondary | ICD-10-CM | POA: Diagnosis not present

## 2018-01-27 DIAGNOSIS — M62830 Muscle spasm of back: Secondary | ICD-10-CM | POA: Diagnosis not present

## 2018-01-31 DIAGNOSIS — M47816 Spondylosis without myelopathy or radiculopathy, lumbar region: Secondary | ICD-10-CM | POA: Diagnosis not present

## 2018-01-31 DIAGNOSIS — M62830 Muscle spasm of back: Secondary | ICD-10-CM | POA: Diagnosis not present

## 2018-01-31 DIAGNOSIS — M545 Low back pain: Secondary | ICD-10-CM | POA: Diagnosis not present

## 2018-02-05 DIAGNOSIS — M62830 Muscle spasm of back: Secondary | ICD-10-CM | POA: Diagnosis not present

## 2018-02-05 DIAGNOSIS — M47816 Spondylosis without myelopathy or radiculopathy, lumbar region: Secondary | ICD-10-CM | POA: Diagnosis not present

## 2018-02-05 DIAGNOSIS — M545 Low back pain: Secondary | ICD-10-CM | POA: Diagnosis not present

## 2018-02-06 ENCOUNTER — Encounter: Payer: Self-pay | Admitting: Internal Medicine

## 2018-02-12 DIAGNOSIS — M47816 Spondylosis without myelopathy or radiculopathy, lumbar region: Secondary | ICD-10-CM | POA: Diagnosis not present

## 2018-02-12 DIAGNOSIS — M62830 Muscle spasm of back: Secondary | ICD-10-CM | POA: Diagnosis not present

## 2018-02-12 DIAGNOSIS — M545 Low back pain: Secondary | ICD-10-CM | POA: Diagnosis not present

## 2018-02-26 ENCOUNTER — Ambulatory Visit: Payer: Self-pay | Admitting: Internal Medicine

## 2018-02-26 NOTE — Telephone Encounter (Signed)
I returned her call.  She is c/o having foot and calf cramping in both legs for the last couple of nights.  She had this happen a few years ago and the cramps resolved on their own.  She denies being out in the heat this week (heat index upper 90's and 100).   She mentioned she drank some Gator Aid, ate some mustard and those helped her get back to sleep and stop cramping.  I went over the home care advice with her.   She is agreeable to trying those remedies and if not improved in a couple of days/nights to call us back.      Reason for Disposition . [1] Caused by muscle cramps in the thigh, calf, or foot AND [2] present < 1 hour (brief, now gone)  Answer Assessment - Initial Assessment Questions 1. ONSET: "When did the pain start?"      Last couple of nights. 2. LOCATION: "Where is the pain located?"      Cramps in both feet and calves.   3. PAIN: "How bad is the pain?"    (Scale 1-10; or mild, moderate, severe)   -  MILD (1-3): doesn't interfere with normal activities    -  MODERATE (4-7): interferes with normal activities (e.g., work or school) or awakens from sleep, limping    -  SEVERE (8-10): excruciating pain, unable to do any normal activities, unable to walk     8 on pain scale when cramping.   If I move around and drink Gator Aid and eat mustard then I'm able to go to sleep.   4. WORK OR EXERCISE: "Has there been any recent work or exercise that involved this part of the body?"      No.   I'm trying to drink a lot drinks. 5. CAUSE: "What do you think is causing the leg pain?"     I had cramps before a few years ago.    They went away on their own.  They happen just when I'm laying down.   6. OTHER SYMPTOMS: "Do you have any other symptoms?" (e.g., chest pain, back pain, breathing difficulty, swelling, rash, fever, numbness, weakness)     No other symptoms 7. PREGNANCY: "Is there any chance you are pregnant?" "When was your last menstrual period?"     No way!  Protocols used:  LEG PAIN-A-AH

## 2018-03-14 ENCOUNTER — Encounter: Payer: Self-pay | Admitting: Family Medicine

## 2018-03-14 ENCOUNTER — Ambulatory Visit: Payer: 59 | Admitting: Family Medicine

## 2018-03-14 DIAGNOSIS — J069 Acute upper respiratory infection, unspecified: Secondary | ICD-10-CM | POA: Diagnosis not present

## 2018-03-14 MED ORDER — HYDROCODONE-HOMATROPINE 5-1.5 MG/5ML PO SYRP: 5.0000 mL | ORAL_SOLUTION | Freq: Three times a day (TID) | ORAL | 0 refills | Status: DC | PRN

## 2018-03-14 NOTE — Progress Notes (Signed)
Angel RobMonica H Wedig - 42 y.o. female MRN [id number]  Date of birth: Jan 08, 1976  SUBJECTIVE:  Including CC & ROS.  Chief Complaint  Patient presents with  . Cough    Angel [name] is a 42 y.o. female that is presenting with a sore throat/cough. Symptoms have been ongoing for one week. Admits to body aches. Denies shortness of breath. She took Nyquil last night. She was in a summer children's camp last week.Denies fevers.      Review of Systems  Constitutional: Negative for fever.  HENT: Positive for congestion, sore throat and voice change.   Respiratory: Positive for cough.   Cardiovascular: Negative for chest pain.  Gastrointestinal: Negative for abdominal pain.    HISTORY: Past Medical, Surgical, Social, and Family History Reviewed & Updated per EMR.   Pertinent Historical Findings include:  Past Medical History:  Diagnosis Date  . Breast mass 06/2013   bilateral  . GERD (gastroesophageal reflux disease) 08/2014   takes Protonix daily  . Herniated disc   . Migraine headache   . Sickle cell trait (HCC)   . Vitamin D deficiency 2009    Past Surgical History:  Procedure Laterality Date  . BILATERAL SALPINGECTOMY  09/26/2015   Procedure: BILATERAL SALPINGECTOMY;  Surgeon: Osborn CohoAngela Roberts, MD;  Location: [facility];  Service: Gynecology;;  . BREAST BIOPSY Bilateral 07/13/2013   Procedure: BILATERAL EXCISION BREAS MASSES;  Surgeon: Shelly Rubensteinouglas A Blackman, MD;  Location: [facility];  Service: General;  Laterality: Bilateral;  . BREAST CYST EXCISION Left 2014   4 cysts removed  . DE QUERVAIN'S RELEASE Right 09/06/2003  . DILITATION & CURRETTAGE/HYSTROSCOPY WITH NOVASURE ABLATION N/A 09/26/2015   Procedure: DILATATION & CURETTAGE/HYSTEROSCOPY WITH NOVASURE ABLATION;  Surgeon: Osborn CohoAngela Roberts, MD;  Location: [facility];  Service: Gynecology;  Laterality: N/A;  . LAPAROSCOPIC OVARIAN CYSTECTOMY Right 09/26/2015   Procedure: LAPAROSCOPIC Right OVARIAN CYSTECTOMY, Bilateral Peritoneal  Biopsies over Uteralsacral area;  Surgeon: Osborn CohoAngela Roberts, MD;  Location: [facility];  Service: Gynecology;  Laterality: Right;  . TUBAL LIGATION  12/27/2005  . WISDOM TOOTH EXTRACTION    . WRIST GANGLION EXCISION Right 09/06/2003  . WRIST GANGLION EXCISION Left 09/04/1999    No Known Allergies  Family History  Problem Relation Age of Onset  . Hypertension Father   . Diabetes Father   . Heart disease Father   . Cancer Father        prostate  . Sickle cell anemia Daughter   . Sickle cell anemia Son      Social History   Socioeconomic History  . Marital status: Married    Spouse name: Not on file  . Number of children: 2  . Years of education: Not on file  . Highest education level: Not on file  Occupational History  . Not on file  Social Needs  . Financial resource strain: Not on file  . Food insecurity:    Worry: Not on file    Inability: Not on file  . Transportation needs:    Medical: Not on file    Non-medical: Not on file  Tobacco Use  . Smoking status: Never Smoker  . Smokeless tobacco: Never Used  Substance and Sexual Activity  . Alcohol use: No  . Drug use: No  . Sexual activity: Not on file    Comment: BTL  Lifestyle  . Physical activity:    Days per week: Not on file    Minutes per session: Not on file  . Stress: Not  on file  Relationships  . Social connections:    Talks on phone: Not on file    Gets together: Not on file    Attends religious service: Not on file    Active member of club or organization: Not on file    Attends meetings of clubs or organizations: Not on file    Relationship status: Not on file  . Intimate partner violence:    Fear of current or ex partner: Not on file    Emotionally abused: Not on file    Physically abused: Not on file    Forced sexual activity: Not on file  Other Topics Concern  . Not on file  Social History Narrative  . Not on file     PHYSICAL EXAM:  VS: BP 138/86 (BP Location: Left Arm, Patient Position:  Sitting, Cuff Size: Normal)   Pulse 95   Temp 98.6 F (37 C) (Oral)   Ht 5\' 6"  (1.676 m)   Wt 199 lb (90.3 kg)   SpO2 98%   BMI 32.12 kg/m  Physical Exam Gen: NAD, alert, cooperative with exam,  ENT: normal lips, normal nasal mucosa, tympanic membranes clear and intact bilaterally, normal oropharynx,  Eye: normal EOM, normal conjunctiva and lids CV:  no edema, +2 pedal pulses, regular rate and rhythm, S1-S2   Resp: no accessory muscle use, non-labored, clear to auscultation bilaterally, no crackles or wheezes Skin: no rashes, no areas of induration  Neuro: normal tone, normal sensation to touch Psych:  normal insight, alert and oriented MSK: Normal gait, normal strength       ASSESSMENT & PLAN:   URI (upper respiratory infection) Symptoms appear to be viral  - hycodan. Looked upon [employer]  - counseled on supportive care - given indications to follow up.

## 2018-03-14 NOTE — Patient Instructions (Signed)
Good to see you   Please try things such as zyrtec-D or allegra-D which is an antihistamine and decongestant.   Please try afrin which will help with nasal congestion but use for only three days.   Please also try using a netti pot on a regular occasion.  Honey can help with a sore throat.   Please let me know if your symptoms don't improve

## 2018-03-15 ENCOUNTER — Telehealth: Payer: Self-pay | Admitting: Family Medicine

## 2018-03-15 NOTE — Telephone Encounter (Signed)
Patient called the after hours [facility] on Saturday, 03/15/2018 at 8:38am stating that she was seen yesterday and was prescribed a cough medication and was told it was sinus problems. Patient now states she is coughing up sputum and her chest in really congested. She is now requesting an antibiotic be sent in for her.    Returned called to patient and left message reiterating MD instructions from office visit note/AVS and informed that an antibiotic would not be able to be sent in without being seen at the Saturday [facility]. I advised her to call back to make an appointment for the Saturday clinic if she felt that was necessary or to call back on Monday if not better.

## 2018-03-16 DIAGNOSIS — J069 Acute upper respiratory infection, unspecified: Secondary | ICD-10-CM | POA: Insufficient documentation

## 2018-03-16 NOTE — Assessment & Plan Note (Signed)
Symptoms appear to be viral  - hycodan. Looked upon [employer] database  - counseled on supportive care - given indications to follow up.

## 2018-03-17 NOTE — Telephone Encounter (Signed)
Spoke with patient about her concerns. Will hold off on ABX.   Myra RudeSchmitz, Jeremy E, MD [facility] 03/17/2018, 3:10 PM

## 2018-03-18 ENCOUNTER — Emergency Department (HOSPITAL_BASED_OUTPATIENT_CLINIC_OR_DEPARTMENT_OTHER)
Admission: EM | Admit: 2018-03-18 | Discharge: 2018-03-18 | Disposition: A | Discharge status: Home / Self Care | Payer: 59 | Attending: Emergency Medicine | Admitting: Emergency Medicine

## 2018-03-18 ENCOUNTER — Encounter (HOSPITAL_BASED_OUTPATIENT_CLINIC_OR_DEPARTMENT_OTHER): Payer: Self-pay | Admitting: *Deleted

## 2018-03-18 ENCOUNTER — Emergency Department (HOSPITAL_BASED_OUTPATIENT_CLINIC_OR_DEPARTMENT_OTHER): Payer: 59

## 2018-03-18 ENCOUNTER — Other Ambulatory Visit: Payer: Self-pay

## 2018-03-18 DIAGNOSIS — Z79899 Other long term (current) drug therapy: Secondary | ICD-10-CM | POA: Diagnosis not present

## 2018-03-18 DIAGNOSIS — R05 Cough: Secondary | ICD-10-CM | POA: Diagnosis not present

## 2018-03-18 DIAGNOSIS — J069 Acute upper respiratory infection, unspecified: Secondary | ICD-10-CM | POA: Diagnosis present

## 2018-03-18 DIAGNOSIS — R079 Chest pain, unspecified: Secondary | ICD-10-CM | POA: Diagnosis not present

## 2018-03-18 DIAGNOSIS — J40 Bronchitis, not specified as acute or chronic: Secondary | ICD-10-CM

## 2018-03-18 MED ORDER — AZITHROMYCIN 250 MG PO TABS: 250.0000 mg | ORAL_TABLET | Freq: Every day | ORAL | 0 refills | Status: DC

## 2018-03-18 NOTE — ED Triage Notes (Signed)
Pt c/o URI symptoms x 1 week , seen BY pMD x 3 days ago DX URI

## 2018-03-18 NOTE — Discharge Instructions (Addendum)
Take the antibiotics as prescribed and use the cough medicine that you are given by your doctor.  Return to the ED if develop chest pain, shortness of breath or any other concerns.

## 2018-03-18 NOTE — ED Provider Notes (Signed)
[facility] EMERGENCY DEPARTMENT Provider Note   CSN: [id number] Arrival date & time: 03/18/18  0005     History   Chief Complaint Chief Complaint  Patient presents with  . URI    HPI Angel [name] is a 42 y.o. female.  Patient states 1 week of cough, hoarse voice, sore throat.  She saw her PCP 3 days ago and was treated supportively for likely viral URI and given Hycodan cough syrup.  States she came in tonight because she had a coughing spell and shortness of breath when she tried to lie down.  She has some chest pain with coughing only.  Denies fevers.  Denies sick contacts.  Complains of sore throat, chest pain with coughing and body aches.  No headache.  No documented fever.  She does not smoke.  No history of asthma or COPD.  No cardiac history.  The history is provided by the patient.  URI   Associated symptoms include chest pain, congestion, rhinorrhea, sore throat and cough. Pertinent negatives include no abdominal pain, no nausea, no vomiting, no dysuria, no headaches and no rash.    Past Medical History:  Diagnosis Date  . Breast mass 06/2013   bilateral  . GERD (gastroesophageal reflux disease) 08/2014   takes Protonix daily  . Herniated disc   . Migraine headache   . Sickle cell trait (HCC)   . Vitamin D deficiency 2009    Patient Active Problem List   Diagnosis Date Noted  . URI (upper respiratory infection) 03/16/2018  . Acne 11/01/2017  . 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 CAP (community acquired pneumonia) 06/04/2017  . Fatigue 06/04/2017  . Neck pain 04/16/2017  . Obesity (BMI 30-39.9) 04/05/2017  . Impacted cerumen of right ear 01/19/2016  . GERD (gastroesophageal reflux disease) 06/02/2015  . Elevated glucose 10/18/2014  . Boil of trunk 06/08/2014  . Pain, abdominal, RLQ 10/16/2013  . Insomnia 10/07/2013  . Mass of multiple sites of left breast 06/08/2013  . Breast mass, right 06/08/2013  . Right lumbar radiculopathy 04/22/2013    . Low back pain 06/27/2012  . Bloating symptom 04/30/2012  . Migraine headache   . Left foot pain 09/19/2011  . Situational mixed anxiety and depressive disorder 06/18/2011  . Menorrhagia 06/18/2011  . Well adult exam 12/20/2010  . FREQUENCY, URINARY 02/16/2010  . PARESTHESIA 11/01/2008  . Chest pain, unspecified 03/26/2008  . WEIGHT GAIN 12/10/2007  . Vitamin D deficiency 09/03/2007  . INSOMNIA 05/31/2007  . Sickle-cell trait (HCC) 05/20/2007  . Migraine without aura 05/20/2007  . ALLERGIC RHINITIS 05/20/2007    Past Surgical History:  Procedure Laterality Date  . BILATERAL SALPINGECTOMY  09/26/2015   Procedure: BILATERAL SALPINGECTOMY;  Surgeon: Osborn Coho, MD;  Location: [facility];  Service: Gynecology;;  . BREAST BIOPSY Bilateral 07/13/2013   Procedure: BILATERAL EXCISION BREAS MASSES;  Surgeon: Shelly Rubenstein, MD;  Location: [facility];  Service: General;  Laterality: Bilateral;  . BREAST CYST EXCISION Left 2014   4 cysts removed  . DE QUERVAIN'S RELEASE Right 09/06/2003  . DILITATION & CURRETTAGE/HYSTROSCOPY WITH NOVASURE ABLATION N/A 09/26/2015   Procedure: DILATATION & CURETTAGE/HYSTEROSCOPY WITH NOVASURE ABLATION;  Surgeon: Osborn Coho, MD;  Location: [facility];  Service: Gynecology;  Laterality: N/A;  . LAPAROSCOPIC OVARIAN CYSTECTOMY Right 09/26/2015   Procedure: LAPAROSCOPIC Right OVARIAN CYSTECTOMY, Bilateral Peritoneal Biopsies over Uteralsacral area;  Surgeon: Osborn Coho, MD;  Location: [facility];  Service: Gynecology;  Laterality: Right;  . TUBAL LIGATION  12/27/2005  . WISDOM TOOTH EXTRACTION    . WRIST GANGLION EXCISION Right 09/06/2003  . WRIST GANGLION EXCISION Left 09/04/1999     OB History    Gravida  2   Para  2   Term  2   Preterm      AB      Living  2     SAB      TAB      Ectopic      Multiple      Live Births               Home Medications    Prior to Admission medications   Medication Sig Start Date End  Date Taking? Authorizing Provider  Cholecalciferol (VITAMIN D3) 2000 units capsule Take 1 capsule (2,000 Units total) by mouth daily. 05/03/17   Plotnikov, Georgina Quint, MD  clindamycin (CLINDAGEL) 1 % gel Apply 1 application topically daily.  07/21/14   [provider]  HYDROcodone-homatropine (HYCODAN) 5-1.5 MG/5ML syrup Take 5 mLs by mouth every 8 (eight) hours as needed for cough. 03/14/18   Myra Rude, MD  hydrocortisone (ANUSOL-HC) 25 MG suppository Place 1 suppository (25 mg total) rectally 2 (two) times daily as needed for hemorrhoids or itching. 11/26/16   Nche, Bonna Gains, NP  ibuprofen (ADVIL,MOTRIN) 800 MG tablet Take 1 tablet (800 mg total) by mouth every 8 (eight) hours as needed. 11/26/16   Nche, Bonna Gains, NP  meloxicam (MOBIC) 15 MG tablet Take 1 tablet (15 mg total) by mouth daily as needed for pain. 07/17/17   Myra Rude, MD  pantoprazole (PROTONIX) 40 MG tablet Take 1 tablet (40 mg total) by mouth daily. Patient taking differently: Take 40 mg by mouth daily as needed.  06/02/15   Corwin Levins, MD  spironolactone (ALDACTONE) 50 MG tablet Take 50 mg by mouth daily. 08/25/14   [provider]  SUMAtriptan (IMITREX) 100 MG tablet Take 1 tablet (100 mg total) by mouth every 2 (two) hours as needed for migraine. May repeat in 2 hours if headache persists or recurs. 09/13/17   Olive Bass, FNP  topiramate (TOPAMAX) 25 MG tablet topiramate 25 mg tablet    [provider]  triamcinolone cream (KENALOG) 0.1 % triamcinolone acetonide 0.1 % topical cream    [provider]    Family History Family History  Problem Relation Age of Onset  . Hypertension Father   . Diabetes Father   . Heart disease Father   . Cancer Father        prostate  . Sickle cell anemia Daughter   . Sickle cell anemia Son     Social History Social History   Tobacco Use  . Smoking status: Never Smoker  . Smokeless tobacco: Never Used  Substance Use  Topics  . Alcohol use: No  . Drug use: No     Allergies   Patient has no known allergies.   Review of Systems Review of Systems  Constitutional: Negative for activity change, appetite change and fever.  HENT: Positive for congestion, rhinorrhea and sore throat.   Eyes: Negative for visual disturbance.  Respiratory: Positive for cough. Negative for shortness of breath.   Cardiovascular: Positive for chest pain.  Gastrointestinal: Negative for abdominal pain, nausea and vomiting.  Genitourinary: Negative for dysuria, hematuria, vaginal bleeding and vaginal discharge.  Musculoskeletal: Negative for arthralgias and myalgias.  Skin: Negative for rash.  Neurological: Negative for dizziness, weakness and headaches.  all other systems are negative except as noted in the HPI and PMH.    Physical Exam Updated Vital Signs BP 134/68   Pulse 93   Temp 98.4 F (36.9 C)   Resp 18   Ht 5\' 6"  (1.676 m)   Wt 90.3 kg (199 lb)   SpO2 100%   BMI 32.12 kg/m   Physical Exam  Constitutional: She is oriented to person, place, and time. She appears well-developed and well-nourished. No distress.  Hoarse voice  HENT:  Head: Normocephalic and atraumatic.  Mouth/Throat: Oropharynx is clear and moist. No oropharyngeal exudate.  No maxillary or sinus tenderness  Eyes: Pupils are equal, round, and reactive to light. Conjunctivae and EOM are normal.  Neck: Normal range of motion. Neck supple.  No meningismus.  Cardiovascular: Normal rate, regular rhythm, normal heart sounds and intact distal pulses.  No murmur heard. Pulmonary/Chest: Effort normal and breath sounds normal. No respiratory distress. She has no wheezes. She exhibits tenderness.  Abdominal: Soft. There is no tenderness. There is no rebound and no guarding.  Musculoskeletal: Normal range of motion. She exhibits no edema or tenderness.  Neurological: She is alert and oriented to person, place, and time. No cranial nerve deficit. She  exhibits normal muscle tone. Coordination normal.  No ataxia on finger to nose bilaterally. No pronator drift. 5/5 strength throughout. CN 2-12 intact.Equal grip strength. Sensation intact.   Skin: Skin is warm.  Psychiatric: She has a normal mood and affect. Her behavior is normal.  Nursing note and vitals reviewed.    ED Treatments / Results  Labs (all labs ordered are listed, but only abnormal results are displayed) Labs Reviewed - No data to display  EKG EKG Interpretation  Date/Time:  Tuesday March 18 2018 01:17:18 EDT Ventricular Rate:  76 PR Interval:    QRS Duration: 91 QT Interval:  383 QTC Calculation: 431 R Axis:   56 Text Interpretation:  Sinus rhythm Baseline wander in lead(s) V6 No previous ECGs available Confirmed by Glynn Octaveancour, Lota Leamer [phone]) on 03/18/2018 1:28:26 AM   Radiology Dg Chest 2 View  Result Date: 03/18/2018 CLINICAL DATA:  Cough with dyspnea and right-sided chest pain since Friday. EXAM: CHEST - 2 VIEW COMPARISON:  05/28/2017 FINDINGS: The heart size and mediastinal contours are within normal limits. Both lungs are clear. The visualized skeletal structures are unremarkable. IMPRESSION: No active cardiopulmonary disease. Electronically Signed   By: Tollie Ethavid  Kwon M.D.   On: 03/18/2018 00:27    Procedures Procedures (including critical care time)  Medications Ordered in ED Medications - No data to display   Initial Impression / Assessment and Plan / ED Course  I have reviewed the triage vital signs and the nursing notes.  Pertinent labs & imaging results that were available during my care of the patient were reviewed by me and considered in my medical decision making (see chart for details).    1 week of cough, sore throat, hoarse voice and chest pain with coughing.  No distress.  No wheezing or hypoxia.  Chest x-ray obtained in triage is negative.  Will treat for bronchitis given length of illness.  Low suspicion for ACS or pulmonary embolism.  EKG  is reassuring. chest pain is atypical for ACS. Patient is PERC negative.   follow-up with PCP.  Return precautions discussed.   Final Clinical Impressions(s) / ED Diagnoses   Final diagnoses:  Bronchitis    ED Discharge Orders    None       Joelyn Lover, Jeannett SeniorStephen,  MD 03/18/18 1610

## 2018-03-19 DIAGNOSIS — M47816 Spondylosis without myelopathy or radiculopathy, lumbar region: Secondary | ICD-10-CM | POA: Diagnosis not present

## 2018-03-19 DIAGNOSIS — M5136 Other intervertebral disc degeneration, lumbar region: Secondary | ICD-10-CM | POA: Diagnosis not present

## 2018-03-19 DIAGNOSIS — M545 Low back pain: Secondary | ICD-10-CM | POA: Diagnosis not present

## 2018-04-03 ENCOUNTER — Ambulatory Visit: Payer: Self-pay | Admitting: Internal Medicine

## 2018-04-03 ENCOUNTER — Encounter: Payer: Self-pay | Admitting: Internal Medicine

## 2018-04-03 ENCOUNTER — Ambulatory Visit: Payer: 59 | Admitting: Internal Medicine

## 2018-04-03 VITALS — BP 122/78 | HR 71 | Temp 98.6°F | Ht 66.0 in | Wt 194.8 lb

## 2018-04-03 DIAGNOSIS — R059 Cough, unspecified: Secondary | ICD-10-CM | POA: Insufficient documentation

## 2018-04-03 DIAGNOSIS — R062 Wheezing: Secondary | ICD-10-CM

## 2018-04-03 DIAGNOSIS — R7309 Other abnormal glucose: Secondary | ICD-10-CM | POA: Diagnosis not present

## 2018-04-03 DIAGNOSIS — R05 Cough: Secondary | ICD-10-CM

## 2018-04-03 MED ORDER — PREDNISONE 10 MG PO TABS: ORAL_TABLET | ORAL | 0 refills | Status: DC

## 2018-04-03 MED ORDER — METHYLPREDNISOLONE ACETATE 80 MG/ML IJ SUSP
80.0000 mg | Freq: Once | INTRAMUSCULAR | Status: AC
  Administered 2018-04-03: 80 mg via INTRAMUSCULAR

## 2018-04-03 MED ORDER — ALBUTEROL SULFATE HFA 108 (90 BASE) MCG/ACT IN AERS: 2.0000 | INHALATION_SPRAY | Freq: Four times a day (QID) | RESPIRATORY_TRACT | 1 refills | Status: DC | PRN

## 2018-04-03 MED ORDER — LEVOFLOXACIN 500 MG PO TABS: 500.0000 mg | ORAL_TABLET | Freq: Every day | ORAL | 0 refills | Status: AC

## 2018-04-03 MED ORDER — HYDROCODONE-HOMATROPINE 5-1.5 MG/5ML PO SYRP: 5.0000 mL | ORAL_SOLUTION | Freq: Three times a day (TID) | ORAL | 0 refills | Status: DC | PRN

## 2018-04-03 NOTE — Assessment & Plan Note (Signed)
stable overall by history and exam, recent data reviewed with pt, and pt to continue medical treatment as before,  to f/u any worsening symptoms or concerns Lab Results  Component Value Date   HGBA1C 5.6 10/18/2014    

## 2018-04-03 NOTE — Progress Notes (Addendum)
Subjective:    Patient ID: Angel [name], female    DOB: [birth year], 42 y.o.   MRN: [id number]  HPI  Here with acute onset mild to mod 3 wks ST, HA, general weakness and malaise, with prod cough greenish sputum, but Pt denies chest pain, increased sob or doe, wheezing, orthopnea, PND, increased LE swelling, palpitations, dizziness or syncope, except for onset mild sob and wheezing especially in the past wk.  Could not go to work in the past 2 days as co workers think she is sick.  Pt denies new neurological symptoms such as new headache, or facial or extremity weakness or numbness   Pt denies polydipsia, polyuria Past Medical History:  Diagnosis Date  . Breast mass 06/2013   bilateral  . GERD (gastroesophageal reflux disease) 08/2014   takes Protonix daily  . Herniated disc   . Migraine headache   . Sickle cell trait (HCC)   . Vitamin D deficiency 2009   Past Surgical History:  Procedure Laterality Date  . BILATERAL SALPINGECTOMY  09/26/2015   Procedure: BILATERAL SALPINGECTOMY;  Surgeon: Osborn Coho, MD;  Location: [facility];  Service: Gynecology;;  . BREAST BIOPSY Bilateral 07/13/2013   Procedure: BILATERAL EXCISION BREAS MASSES;  Surgeon: Shelly Rubenstein, MD;  Location: [facility];  Service: General;  Laterality: Bilateral;  . BREAST CYST EXCISION Left 2014   4 cysts removed  . DE QUERVAIN'S RELEASE Right 09/06/2003  . DILITATION & CURRETTAGE/HYSTROSCOPY WITH NOVASURE ABLATION N/A 09/26/2015   Procedure: DILATATION & CURETTAGE/HYSTEROSCOPY WITH NOVASURE ABLATION;  Surgeon: Osborn Coho, MD;  Location: [facility];  Service: Gynecology;  Laterality: N/A;  . LAPAROSCOPIC OVARIAN CYSTECTOMY Right 09/26/2015   Procedure: LAPAROSCOPIC Right OVARIAN CYSTECTOMY, Bilateral Peritoneal Biopsies over Uteralsacral area;  Surgeon: Osborn Coho, MD;  Location: [facility];  Service: Gynecology;  Laterality: Right;  . TUBAL LIGATION  12/27/2005  . WISDOM TOOTH EXTRACTION    . WRIST  GANGLION EXCISION Right 09/06/2003  . WRIST GANGLION EXCISION Left 09/04/1999    reports that she has never smoked. She has never used smokeless tobacco. She reports that she does not drink alcohol or use drugs. family history includes Cancer in her father; Diabetes in her father; Heart disease in her father; Hypertension in her father; Sickle cell anemia in her daughter and son. No Known Allergies Current Outpatient Medications on File Prior to Visit  Medication Sig Dispense Refill  . azithromycin (ZITHROMAX) 250 MG tablet Take 1 tablet (250 mg total) by mouth daily. Take first 2 tablets together, then 1 every day until finished. 6 tablet 0  . Cholecalciferol (VITAMIN D3) 2000 units capsule Take 1 capsule (2,000 Units total) by mouth daily. 100 capsule 3  . clindamycin (CLINDAGEL) 1 % gel Apply 1 application topically daily.   11  . hydrocortisone (ANUSOL-HC) 25 MG suppository Place 1 suppository (25 mg total) rectally 2 (two) times daily as needed for hemorrhoids or itching. 20 suppository 0  . ibuprofen (ADVIL,MOTRIN) 800 MG tablet Take 1 tablet (800 mg total) by mouth every 8 (eight) hours as needed. 30 tablet 0  . meloxicam (MOBIC) 15 MG tablet Take 1 tablet (15 mg total) by mouth daily as needed for pain. 30 tablet 1  . pantoprazole (PROTONIX) 40 MG tablet Take 1 tablet (40 mg total) by mouth daily. (Patient taking differently: Take 40 mg by mouth daily as needed. ) 90 tablet 3  . SUMAtriptan (IMITREX) 100 MG tablet Take 1 tablet (100 mg total) by mouth  every 2 (two) hours as needed for migraine. May repeat in 2 hours if headache persists or recurs. 10 tablet 0  . topiramate (TOPAMAX) 25 MG tablet topiramate 25 mg tablet    . triamcinolone cream (KENALOG) 0.1 % triamcinolone acetonide 0.1 % topical cream     No current facility-administered medications on file prior to visit.    Review of Systems  Constitutional: Negative for other unusual diaphoresis or sweats HENT: Negative for ear  discharge or swelling Eyes: Negative for other worsening visual disturbances Respiratory: Negative for stridor or other swelling  Gastrointestinal: Negative for worsening distension or other blood Genitourinary: Negative for retention or other urinary change Musculoskeletal: Negative for other MSK pain or swelling Skin: Negative for color change or other new lesions Neurological: Negative for worsening tremors and other numbness  Psychiatric/Behavioral: Negative for worsening agitation or other fatigue All other system neg per pt    Objective:   Physical Exam BP 122/78 (BP Location: Left Arm, Patient Position: Sitting, Cuff Size: Normal)   Pulse 71   Temp 98.6 F (37 C) (Oral)   Ht 5\' 6"  (1.676 m)   Wt 194 lb 12.8 oz (88.4 kg)   SpO2 99%   BMI 31.44 kg/m  VS noted, mild ill appearing Constitutional: Pt appears in NAD HENT: Head: NCAT.  Right Ear: External ear normal.  Left Ear: External ear normal.  Bilat tm's with mild erythema.  Max sinus areas non tender.  Pharynx with mild erythema, no exudate  Eyes: . Pupils are equal, round, and reactive to light. Conjunctivae and EOM are normal Nose: without d/c or deformity Neck: Neck supple. Gross normal ROM Cardiovascular: Normal rate and regular rhythm.   Pulmonary/Chest: Effort normal and breath sounds decreased without rales but with mild bilat wheezing.  Neurological: Pt is alert. At baseline orientation, motor grossly intact Skin: Skin is warm. No rashes, other new lesions, no LE edema Psychiatric: Pt behavior is normal without agitation  No other exam findings Lab Results  Component Value Date   WBC 5.1 05/03/2017   HGB 13.0 05/03/2017   HCT 39.7 05/03/2017   PLT 227.0 05/03/2017   GLUCOSE 94 11/01/2017   CHOL 179 05/03/2017   TRIG 45.0 05/03/2017   HDL 74.50 05/03/2017   LDLCALC 96 05/03/2017   ALT 13 11/01/2017   AST 13 11/01/2017   NA 140 11/01/2017   K 4.3 11/01/2017   CL 109 11/01/2017   CREATININE 0.85  11/01/2017   BUN 15 11/01/2017   CO2 25 11/01/2017   TSH 1.94 05/03/2017   HGBA1C 5.6 10/18/2014       Assessment & Plan:  Medical screening examination/treatment/procedure(s) were performed by non-physician practitioner and as supervising physician I was immediately available for consultation/collaboration. I agree with above. Oliver BarreJames John, MD

## 2018-04-03 NOTE — Addendum Note (Signed)
Addended by: Karma GanjaSMITH, CARLA J on: 04/03/2018 01:45 PM   Modules accepted: Orders

## 2018-04-03 NOTE — Assessment & Plan Note (Signed)
Mild, for predpac asd, depomedrol IM 80, and albuterol HFA for prn use, to f/u any worsening symptoms or concerns

## 2018-04-03 NOTE — Telephone Encounter (Signed)
noted 

## 2018-04-03 NOTE — Telephone Encounter (Signed)
Called in c/o shortness of breath.   Was diagnosed with URI a few weeks ago.  Was seen by Dr. Jordan LikesSchmitz on 03/14/18 and went to the ED on 03/18/18 diagnosed with bronchitis. Starting Tuesday I'm coughing again and feeling short of breath like I never got over this stuff.  See triage notes.  Scheduled with Dr.James John for today at 11:20am.     Her PCP Dr. Posey ReaPlotnikov was booked.   Reason for Disposition . [1] MILD difficulty breathing (e.g., minimal/no SOB at rest, SOB with walking, pulse <100) AND [2] NEW-onset or WORSE than normal  Answer Assessment - Initial Assessment Questions 1. RESPIRATORY STATUS: "Describe your breathing?" (e.g., wheezing, shortness of breath, unable to speak, severe coughing)      I had bronchitis a few weeks ago.   I was doing good but a couple of days ago I started coughing again.   I'm using my inhaler.   I left a message yesterday.   Someone called me back yesterday afternoon.   They said they wanted to see me but no appt was made. 2. ONSET: "When did this breathing problem begin?"      Maybe Tuesday I started feeling bad again. 3. PATTERN "Does the difficult breathing come and go, or has it been constant since it started?"      Constant 4. SEVERITY: "How bad is your breathing?" (e.g., mild, moderate, severe)    - MILD: No SOB at rest, mild SOB with walking, speaks normally in sentences, can lay down, no retractions, pulse < 100.    - MODERATE: SOB at rest, SOB with minimal exertion and prefers to sit, cannot lie down flat, speaks in phrases, mild retractions, audible wheezing, pulse 100-120.    - SEVERE: Very SOB at rest, speaks in single words, struggling to breathe, sitting hunched forward, retractions, pulse > 120      I'm short of breath if exertion. 5. RECURRENT SYMPTOM: "Have you had difficulty breathing before?" If so, ask: "When was the last time?" and "What happened that time?"      I was sick earlier but now I'm not feeling well again. 6. CARDIAC HISTORY:  "Do you have any history of heart disease?" (e.g., heart attack, angina, bypass surgery, angioplasty)      No 7. LUNG HISTORY: "Do you have any history of lung disease?"  (e.g., pulmonary embolus, asthma, emphysema)     I had pneumonia about a year ago.    If I get a cold it goes into my chest.   No asthma. 8. CAUSE: "What do you think is causing the breathing problem?"      I don't know.    Maybe I never cleared from where I was sick earlier. 9. OTHER SYMPTOMS: "Do you have any other symptoms? (e.g., dizziness, runny nose, cough, chest pain, fever)     Coughing, no fever, nose congestion.   My right side of my chest feels congested. 10. PREGNANCY: "Is there any chance you are pregnant?" "When was your last menstrual period?"       No. 11. TRAVEL: "Have you traveled out of the country in the last month?" (e.g., travel history, exposures)       No  Protocols used: BREATHING DIFFICULTY-A-AH

## 2018-04-03 NOTE — Patient Instructions (Signed)
You had the steroid shot today  Please take all new medication as prescribed - the antibiotic, cough medicine, prednisone, and inhaler if needed  Please continue all other medications as before, and refills have been done if requested.  Please have the pharmacy call with any other refills you may need.  Please keep your appointments with your specialists as you may have planned

## 2018-04-03 NOTE — Assessment & Plan Note (Signed)
Mild to mod, declines cxr, for antibx course, cough med prn,  to f/u any worsening symptoms or concerns

## 2018-04-04 DIAGNOSIS — M5416 Radiculopathy, lumbar region: Secondary | ICD-10-CM | POA: Diagnosis not present

## 2018-04-04 DIAGNOSIS — M5116 Intervertebral disc disorders with radiculopathy, lumbar region: Secondary | ICD-10-CM | POA: Diagnosis not present

## 2018-04-04 DIAGNOSIS — M5136 Other intervertebral disc degeneration, lumbar region: Secondary | ICD-10-CM | POA: Diagnosis not present

## 2018-04-04 DIAGNOSIS — M2548 Effusion, other site: Secondary | ICD-10-CM | POA: Diagnosis not present

## 2018-04-04 DIAGNOSIS — M47816 Spondylosis without myelopathy or radiculopathy, lumbar region: Secondary | ICD-10-CM | POA: Diagnosis not present

## 2018-04-04 DIAGNOSIS — M4726 Other spondylosis with radiculopathy, lumbar region: Secondary | ICD-10-CM | POA: Diagnosis not present

## 2018-04-15 DIAGNOSIS — M4316 Spondylolisthesis, lumbar region: Secondary | ICD-10-CM | POA: Diagnosis not present

## 2018-04-15 DIAGNOSIS — M5441 Lumbago with sciatica, right side: Secondary | ICD-10-CM | POA: Diagnosis not present

## 2018-04-15 DIAGNOSIS — M5137 Other intervertebral disc degeneration, lumbosacral region: Secondary | ICD-10-CM | POA: Diagnosis not present

## 2018-04-15 DIAGNOSIS — M5136 Other intervertebral disc degeneration, lumbar region: Secondary | ICD-10-CM | POA: Diagnosis not present

## 2018-04-21 NOTE — Telephone Encounter (Signed)
Pt called to follow up on status of her email. Pt states she has a sickle cell trait that can lead to a rare type of renal cancer. Pt said the MRI was done at [facility] and they said they were sending results to Dr. Posey Rea and that he would be able to access thru their system. Pt is very concerned about the renal cyst she was told about. Please advise.  Call back # [phone] [facility] to leave detailed msg if no answer per pt.

## 2018-04-24 NOTE — Telephone Encounter (Signed)
Sent records request

## 2018-04-25 NOTE — Telephone Encounter (Signed)
Imaging in folder on desk

## 2018-04-29 DIAGNOSIS — M4310 Spondylolisthesis, site unspecified: Secondary | ICD-10-CM | POA: Diagnosis not present

## 2018-04-29 DIAGNOSIS — M47816 Spondylosis without myelopathy or radiculopathy, lumbar region: Secondary | ICD-10-CM | POA: Diagnosis not present

## 2018-05-01 ENCOUNTER — Other Ambulatory Visit: Payer: Self-pay

## 2018-05-05 MED ORDER — SUMATRIPTAN SUCCINATE 100 MG PO TABS: 100.0000 mg | ORAL_TABLET | ORAL | 3 refills | Status: DC | PRN

## 2018-05-06 DIAGNOSIS — L732 Hidradenitis suppurativa: Secondary | ICD-10-CM | POA: Diagnosis not present

## 2018-05-06 DIAGNOSIS — L700 Acne vulgaris: Secondary | ICD-10-CM | POA: Diagnosis not present

## 2018-05-07 ENCOUNTER — Encounter: Payer: Self-pay | Admitting: Internal Medicine

## 2018-05-07 ENCOUNTER — Ambulatory Visit: Payer: 59 | Admitting: Internal Medicine

## 2018-05-07 VITALS — BP 118/66 | HR 71 | Temp 98.4°F | Ht 66.0 in | Wt 195.0 lb

## 2018-05-07 DIAGNOSIS — M545 Low back pain, unspecified: Secondary | ICD-10-CM

## 2018-05-07 DIAGNOSIS — G43001 Migraine without aura, not intractable, with status migrainosus: Secondary | ICD-10-CM | POA: Diagnosis not present

## 2018-05-07 DIAGNOSIS — F4323 Adjustment disorder with mixed anxiety and depressed mood: Secondary | ICD-10-CM | POA: Diagnosis not present

## 2018-05-07 DIAGNOSIS — E559 Vitamin D deficiency, unspecified: Secondary | ICD-10-CM

## 2018-05-07 DIAGNOSIS — N281 Cyst of kidney, acquired: Secondary | ICD-10-CM | POA: Diagnosis not present

## 2018-05-07 DIAGNOSIS — Z23 Encounter for immunization: Secondary | ICD-10-CM | POA: Diagnosis not present

## 2018-05-07 NOTE — Assessment & Plan Note (Signed)
Vit D 

## 2018-05-07 NOTE — Assessment & Plan Note (Signed)
Once q 2 mo

## 2018-05-07 NOTE — Progress Notes (Signed)
Subjective:  Patient ID: Angel [name], female    DOB: [birth year]  Age: 42 y.o. MRN: [id number]  CC: No chief complaint on file.   HPI Angel RobMonica H Belay presents for HAs, anxiety, insomnia, LBP f/u  Outpatient Medications Prior to Visit  Medication Sig Dispense Refill  . albuterol (PROVENTIL HFA;VENTOLIN HFA) 108 (90 Base) MCG/ACT inhaler Inhale 2 puffs into the lungs every 6 (six) hours as needed for wheezing or shortness of breath. 1 Inhaler 1  . Cholecalciferol (VITAMIN D3) 2000 units capsule Take 1 capsule (2,000 Units total) by mouth daily. 100 capsule 3  . clindamycin (CLINDAGEL) 1 % gel Apply 1 application topically daily.   11  . HYDROcodone-homatropine (HYCODAN) 5-1.5 MG/5ML syrup Take 5 mLs by mouth every 8 (eight) hours as needed for cough. 180 mL 0  . hydrocortisone (ANUSOL-HC) 25 MG suppository Place 1 suppository (25 mg total) rectally 2 (two) times daily as needed for hemorrhoids or itching. 20 suppository 0  . ibuprofen (ADVIL,MOTRIN) 800 MG tablet Take 1 tablet (800 mg total) by mouth every 8 (eight) hours as needed. 30 tablet 0  . pregabalin (LYRICA) 50 MG capsule Take by mouth.    . spironolactone (ALDACTONE) 50 MG tablet     . SUMAtriptan (IMITREX) 100 MG tablet Take 1 tablet (100 mg total) by mouth every 2 (two) hours as needed for migraine. May repeat in 2 hours if headache persists or recurs. 12 tablet 3  . triamcinolone cream (KENALOG) 0.1 % triamcinolone acetonide 0.1 % topical cream    . azithromycin (ZITHROMAX) 250 MG tablet Take 1 tablet (250 mg total) by mouth daily. Take first 2 tablets together, then 1 every day until finished. 6 tablet 0  . meloxicam (MOBIC) 15 MG tablet Take 1 tablet (15 mg total) by mouth daily as needed for pain. 30 tablet 1  . pantoprazole (PROTONIX) 40 MG tablet Take 1 tablet (40 mg total) by mouth daily. (Patient taking differently: Take 40 mg by mouth daily as needed. ) 90 tablet 3  . predniSONE (DELTASONE) 10 MG tablet 3 tabs by  mouth per day for 3 days,2tabs per day for 3 days,1tab per day for 3 days 18 tablet 0  . topiramate (TOPAMAX) 25 MG tablet topiramate 25 mg tablet     No facility-administered medications prior to visit.     ROS: Review of Systems  Constitutional: Positive for fatigue. Negative for activity change, appetite change, chills and unexpected weight change.  HENT: Negative for congestion, mouth sores and sinus pressure.   Eyes: Negative for visual disturbance.  Respiratory: Negative for cough and chest tightness.   Gastrointestinal: Negative for abdominal pain and nausea.  Genitourinary: Negative for difficulty urinating, frequency and vaginal pain.  Musculoskeletal: Positive for back pain and gait problem.  Skin: Negative for pallor and rash.  Neurological: Positive for headaches. Negative for dizziness, tremors, weakness and numbness.  Psychiatric/Behavioral: Negative for confusion, sleep disturbance and suicidal ideas. The patient is nervous/anxious.     Objective:  BP 118/66 (BP Location: Left Arm, Patient Position: Sitting, Cuff Size: Large)   Pulse 71   Temp 98.4 F (36.9 C) (Oral)   Ht 5\' 6"  (1.676 m)   Wt 195 lb (88.5 kg)   SpO2 98%   BMI 31.47 kg/m   BP Readings from Last 3 Encounters:  05/07/18 118/66  04/03/18 122/78  03/18/18 109/76    Wt Readings from Last 3 Encounters:  05/07/18 195 lb (88.5 kg)  04/03/18 194 lb 12.8  oz (88.4 kg)  03/18/18 199 lb (90.3 kg)    Physical Exam  Constitutional: She appears well-developed. No distress.  HENT:  Head: Normocephalic.  Right Ear: External ear normal.  Left Ear: External ear normal.  Nose: Nose normal.  Mouth/Throat: Oropharynx is clear and moist.  Eyes: Pupils are equal, round, and reactive to light. Conjunctivae are normal. Right eye exhibits no discharge. Left eye exhibits no discharge.  Neck: Normal range of motion. Neck supple. No JVD present. No tracheal deviation present. No thyromegaly present.    Cardiovascular: Normal rate, regular rhythm and normal heart sounds.  Pulmonary/Chest: No stridor. No respiratory distress. She has no wheezes.  Abdominal: Soft. Bowel sounds are normal. She exhibits no distension and no mass. There is no tenderness. There is no rebound and no guarding.  Musculoskeletal: She exhibits tenderness. She exhibits no edema.  Lymphadenopathy:    She has no cervical adenopathy.  Neurological: She displays normal reflexes. No cranial nerve deficit. She exhibits normal muscle tone. Coordination normal.  Skin: No rash noted. No erythema.  Psychiatric: She has a normal mood and affect. Her behavior is normal. Judgment and thought content normal.  LS tender w/ROM  Lab Results  Component Value Date   WBC 5.1 05/03/2017   HGB 13.0 05/03/2017   HCT 39.7 05/03/2017   PLT 227.0 05/03/2017   GLUCOSE 94 11/01/2017   CHOL 179 05/03/2017   TRIG 45.0 05/03/2017   HDL 74.50 05/03/2017   LDLCALC 96 05/03/2017   ALT 13 11/01/2017   AST 13 11/01/2017   NA 140 11/01/2017   K 4.3 11/01/2017   CL 109 11/01/2017   CREATININE 0.85 11/01/2017   BUN 15 11/01/2017   CO2 25 11/01/2017   TSH 1.94 05/03/2017   HGBA1C 5.6 10/18/2014    Dg Chest 2 View  Result Date: 03/18/2018 CLINICAL DATA:  Cough with dyspnea and right-sided chest pain since Friday. EXAM: CHEST - 2 VIEW COMPARISON:  05/28/2017 FINDINGS: The heart size and mediastinal contours are within normal limits. Both lungs are clear. The visualized skeletal structures are unremarkable. IMPRESSION: No active cardiopulmonary disease. Electronically Signed   By: Tollie Eth M.D.   On: 03/18/2018 00:27    Assessment & Plan:   There are no diagnoses linked to this encounter.   No orders of the defined types were placed in this encounter.    Follow-up: No follow-ups on file.  Sonda Primes, MD

## 2018-05-07 NOTE — Assessment & Plan Note (Signed)
Will sch US 

## 2018-05-07 NOTE — Assessment & Plan Note (Signed)
Discussed.

## 2018-05-07 NOTE — Addendum Note (Signed)
Addended by: Scarlett Presto on: 05/07/2018 10:31 AM   Modules accepted: Orders

## 2018-05-07 NOTE — Assessment & Plan Note (Signed)
F/u w/NS May need to have surgery

## 2018-05-09 ENCOUNTER — Ambulatory Visit: Payer: 59 | Admitting: Internal Medicine

## 2018-05-26 ENCOUNTER — Ambulatory Visit
Admission: RE | Admit: 2018-05-26 | Discharge: 2018-05-26 | Disposition: A | Discharge status: Home / Self Care | Payer: 59 | Source: Ambulatory Visit | Attending: Internal Medicine | Admitting: Internal Medicine

## 2018-05-26 DIAGNOSIS — N281 Cyst of kidney, acquired: Secondary | ICD-10-CM | POA: Diagnosis not present

## 2018-05-26 DIAGNOSIS — N2889 Other specified disorders of kidney and ureter: Secondary | ICD-10-CM | POA: Diagnosis not present

## 2018-05-28 ENCOUNTER — Other Ambulatory Visit: Payer: Self-pay | Admitting: Internal Medicine

## 2018-05-28 ENCOUNTER — Ambulatory Visit
Admission: RE | Admit: 2018-05-28 | Discharge: 2018-05-28 | Disposition: A | Discharge status: Home / Self Care | Payer: Self-pay | Source: Ambulatory Visit | Attending: Internal Medicine | Admitting: Internal Medicine

## 2018-05-28 DIAGNOSIS — M4316 Spondylolisthesis, lumbar region: Secondary | ICD-10-CM | POA: Diagnosis not present

## 2018-05-28 DIAGNOSIS — M5410 Radiculopathy, site unspecified: Secondary | ICD-10-CM

## 2018-05-28 DIAGNOSIS — Z01818 Encounter for other preprocedural examination: Secondary | ICD-10-CM | POA: Diagnosis not present

## 2018-05-28 DIAGNOSIS — M47896 Other spondylosis, lumbar region: Secondary | ICD-10-CM | POA: Diagnosis not present

## 2018-05-28 DIAGNOSIS — M4310 Spondylolisthesis, site unspecified: Secondary | ICD-10-CM | POA: Diagnosis not present

## 2018-05-28 DIAGNOSIS — M47816 Spondylosis without myelopathy or radiculopathy, lumbar region: Secondary | ICD-10-CM | POA: Diagnosis not present

## 2018-06-03 DIAGNOSIS — M4310 Spondylolisthesis, site unspecified: Secondary | ICD-10-CM | POA: Diagnosis not present

## 2018-06-03 DIAGNOSIS — M47816 Spondylosis without myelopathy or radiculopathy, lumbar region: Secondary | ICD-10-CM | POA: Diagnosis not present

## 2018-06-20 DIAGNOSIS — Z01818 Encounter for other preprocedural examination: Secondary | ICD-10-CM | POA: Diagnosis not present

## 2018-06-20 DIAGNOSIS — G43909 Migraine, unspecified, not intractable, without status migrainosus: Secondary | ICD-10-CM | POA: Diagnosis not present

## 2018-06-20 DIAGNOSIS — M4316 Spondylolisthesis, lumbar region: Secondary | ICD-10-CM | POA: Diagnosis not present

## 2018-06-20 DIAGNOSIS — M47896 Other spondylosis, lumbar region: Secondary | ICD-10-CM | POA: Diagnosis not present

## 2018-07-03 DIAGNOSIS — M47816 Spondylosis without myelopathy or radiculopathy, lumbar region: Secondary | ICD-10-CM | POA: Diagnosis not present

## 2018-07-03 DIAGNOSIS — M5136 Other intervertebral disc degeneration, lumbar region: Secondary | ICD-10-CM | POA: Diagnosis not present

## 2018-07-03 DIAGNOSIS — M4316 Spondylolisthesis, lumbar region: Secondary | ICD-10-CM | POA: Diagnosis not present

## 2018-07-03 DIAGNOSIS — M4306 Spondylolysis, lumbar region: Secondary | ICD-10-CM | POA: Diagnosis not present

## 2018-07-03 DIAGNOSIS — Z981 Arthrodesis status: Secondary | ICD-10-CM | POA: Diagnosis not present

## 2018-07-05 HISTORY — PX: SPINAL FUSION: SHX223

## 2018-07-06 MED ORDER — OXYCODONE HCL 5 MG PO TABS
5.00 | ORAL_TABLET | ORAL | Status: DC
Start: ? — End: 2018-07-06

## 2018-07-06 MED ORDER — GENERIC EXTERNAL MEDICATION
4.00 | Status: DC
Start: ? — End: 2018-07-06

## 2018-07-06 MED ORDER — ACETAMINOPHEN 325 MG PO TABS
650.00 | ORAL_TABLET | ORAL | Status: DC
Start: 2018-07-05 — End: 2018-07-06

## 2018-07-06 MED ORDER — BISACODYL 5 MG PO TBEC
10.00 | DELAYED_RELEASE_TABLET | ORAL | Status: DC
Start: 2018-07-06 — End: 2018-07-06

## 2018-07-06 MED ORDER — TIZANIDINE HCL 4 MG PO TABS
4.00 | ORAL_TABLET | ORAL | Status: DC
Start: ? — End: 2018-07-06

## 2018-07-06 MED ORDER — GENERIC EXTERNAL MEDICATION
0.04 | Status: DC
Start: ? — End: 2018-07-06

## 2018-07-06 MED ORDER — GENERIC EXTERNAL MEDICATION
10.00 | Status: DC
Start: ? — End: 2018-07-06

## 2018-07-06 MED ORDER — ALBUTEROL SULFATE (2.5 MG/3ML) 0.083% IN NEBU
2.50 | INHALATION_SOLUTION | RESPIRATORY_TRACT | Status: DC
Start: ? — End: 2018-07-06

## 2018-07-06 MED ORDER — OXYCODONE HCL 10 MG PO TABS
10.00 | ORAL_TABLET | ORAL | Status: DC
Start: ? — End: 2018-07-06

## 2018-07-06 MED ORDER — SENNA-DOCUSATE SODIUM 8.6-50 MG PO TABS
1.00 | ORAL_TABLET | ORAL | Status: DC
Start: 2018-07-05 — End: 2018-07-06

## 2018-07-06 MED ORDER — SUMATRIPTAN SUCCINATE 100 MG PO TABS
100.00 | ORAL_TABLET | ORAL | Status: DC
Start: ? — End: 2018-07-06

## 2018-07-06 MED ORDER — GENERIC EXTERNAL MEDICATION
0.08 | Status: DC
Start: ? — End: 2018-07-06

## 2018-07-06 MED ORDER — NALOXEGOL OXALATE 25 MG PO TABS
25.00 | ORAL_TABLET | ORAL | Status: DC
Start: 2018-07-06 — End: 2018-07-06

## 2018-07-06 MED ORDER — DOCUSATE SODIUM 100 MG PO CAPS
200.00 | ORAL_CAPSULE | ORAL | Status: DC
Start: 2018-07-05 — End: 2018-07-06

## 2018-07-06 MED ORDER — BENZOCAINE-MENTHOL 15-3.6 MG MT LOZG
1.00 | LOZENGE | OROMUCOSAL | Status: DC
Start: ? — End: 2018-07-06

## 2018-07-06 MED ORDER — GENERIC EXTERNAL MEDICATION
5.00 | Status: DC
Start: ? — End: 2018-07-06

## 2018-07-06 MED ORDER — POLYETHYLENE GLYCOL 3350 17 G PO PACK
17.00 | PACK | ORAL | Status: DC
Start: 2018-07-05 — End: 2018-07-06

## 2018-07-06 MED ORDER — SIMETHICONE 80 MG PO CHEW
160.00 | CHEWABLE_TABLET | ORAL | Status: DC
Start: ? — End: 2018-07-06

## 2018-07-06 MED ORDER — SODIUM CHLORIDE 0.9 % IV SOLN
10.00 | INTRAVENOUS | Status: DC
Start: ? — End: 2018-07-06

## 2018-07-22 DIAGNOSIS — Z981 Arthrodesis status: Secondary | ICD-10-CM | POA: Diagnosis not present

## 2018-07-22 DIAGNOSIS — M4310 Spondylolisthesis, site unspecified: Secondary | ICD-10-CM | POA: Diagnosis not present

## 2018-07-22 DIAGNOSIS — M4316 Spondylolisthesis, lumbar region: Secondary | ICD-10-CM | POA: Diagnosis not present

## 2018-08-21 DIAGNOSIS — Z719 Counseling, unspecified: Secondary | ICD-10-CM | POA: Diagnosis not present

## 2018-08-22 DIAGNOSIS — Z981 Arthrodesis status: Secondary | ICD-10-CM | POA: Diagnosis not present

## 2018-08-22 DIAGNOSIS — M5136 Other intervertebral disc degeneration, lumbar region: Secondary | ICD-10-CM | POA: Diagnosis not present

## 2018-08-28 DIAGNOSIS — Z719 Counseling, unspecified: Secondary | ICD-10-CM | POA: Diagnosis not present

## 2018-08-29 DIAGNOSIS — M545 Low back pain: Secondary | ICD-10-CM | POA: Diagnosis not present

## 2018-08-29 DIAGNOSIS — Z981 Arthrodesis status: Secondary | ICD-10-CM | POA: Diagnosis not present

## 2018-08-29 DIAGNOSIS — M5136 Other intervertebral disc degeneration, lumbar region: Secondary | ICD-10-CM | POA: Diagnosis not present

## 2018-09-03 DIAGNOSIS — Z981 Arthrodesis status: Secondary | ICD-10-CM | POA: Diagnosis not present

## 2018-09-03 DIAGNOSIS — M545 Low back pain: Secondary | ICD-10-CM | POA: Diagnosis not present

## 2018-09-03 DIAGNOSIS — M5136 Other intervertebral disc degeneration, lumbar region: Secondary | ICD-10-CM | POA: Diagnosis not present

## 2018-09-09 DIAGNOSIS — M5136 Other intervertebral disc degeneration, lumbar region: Secondary | ICD-10-CM | POA: Diagnosis not present

## 2018-09-09 DIAGNOSIS — Z719 Counseling, unspecified: Secondary | ICD-10-CM | POA: Diagnosis not present

## 2018-09-09 DIAGNOSIS — Z981 Arthrodesis status: Secondary | ICD-10-CM | POA: Diagnosis not present

## 2018-09-09 DIAGNOSIS — M545 Low back pain: Secondary | ICD-10-CM | POA: Diagnosis not present

## 2018-09-09 IMAGING — MG DIGITAL DIAGNOSTIC BILATERAL MAMMOGRAM WITH TOMO AND CAD
6 of 12 series · 6 of 36 positions shown · non-contrast
Comparison: Mammography 01/10/2017, 01/10/2016 and earlier.

CLINICAL DATA: 41-year-old presenting with a palpable in the right
axilla which she has noticed for approximately 2 years, recently
becoming painful. She also has a palpable lump involving the inner
left breast at middle to posterior depth which she initially noticed
in July 2017. Prior benign excisional biopsies of both breasts
in 1771.

EXAM:
DIGITAL DIAGNOSTIC BILATERAL MAMMOGRAM WITH CAD AND TOMO
LIMITED ULTRASOUND LEFT BREAST
ULTRASOUND RIGHT AXILLA

[L MLO synth-2D]
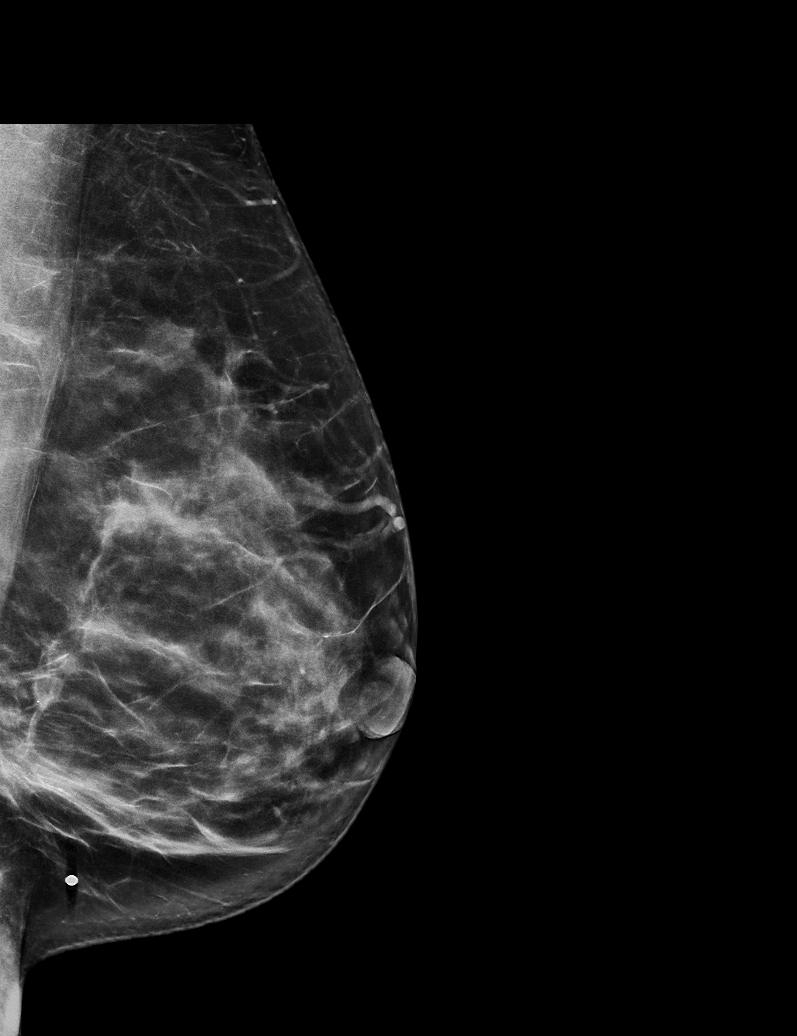

[R MLO synth-2D]
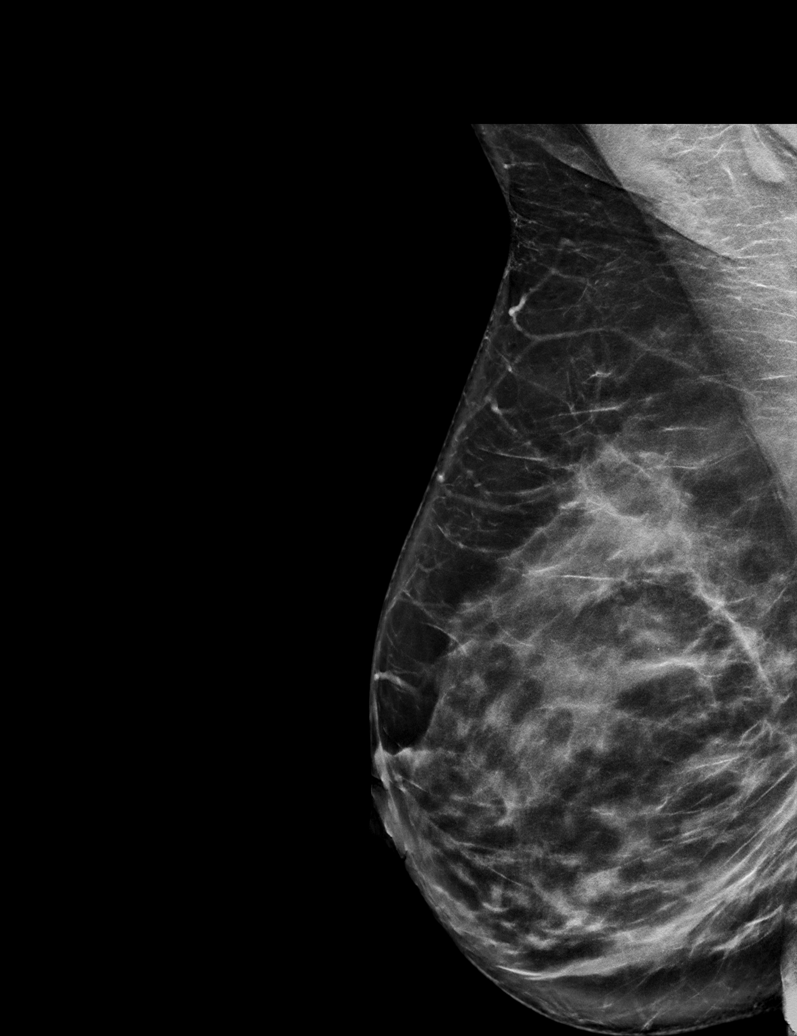

[R TAN synth-2D]
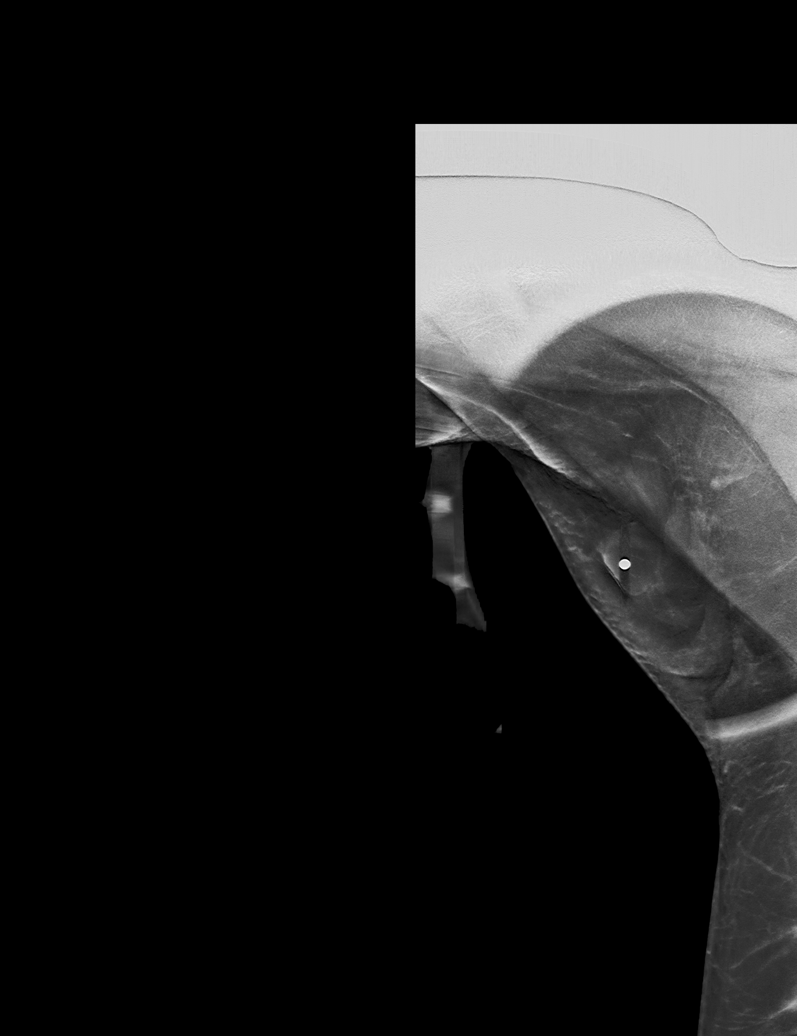

[L TAN synth-2D]
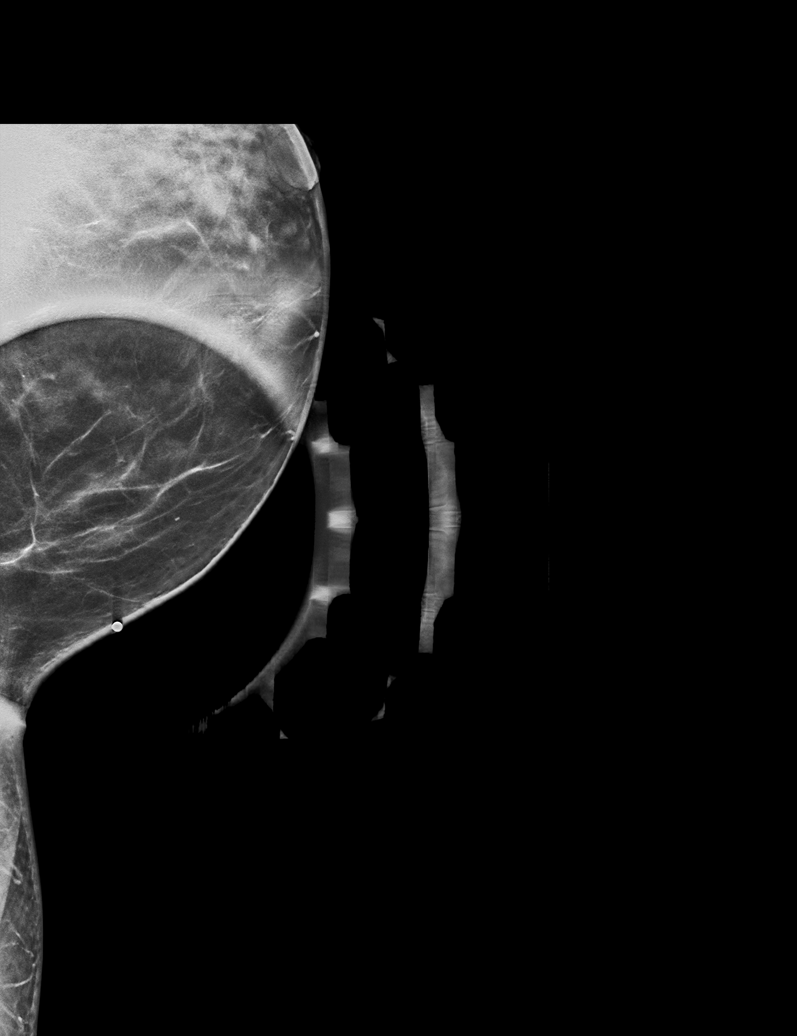

[R CC synth-2D]
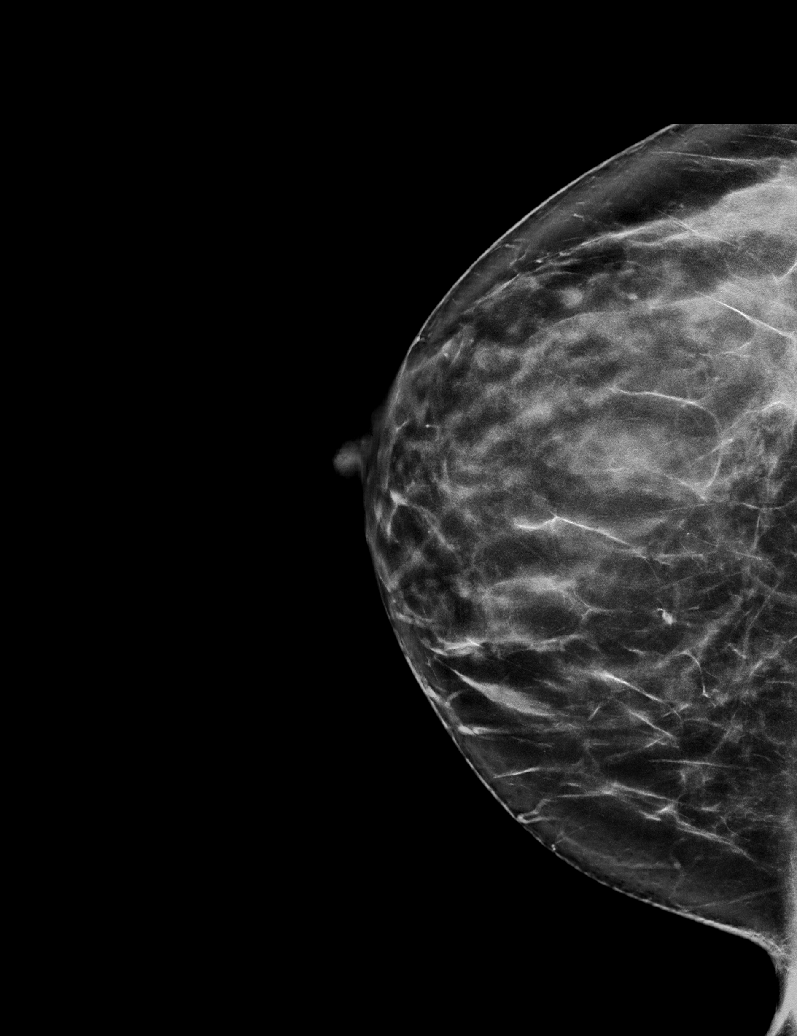

[L CC synth-2D]
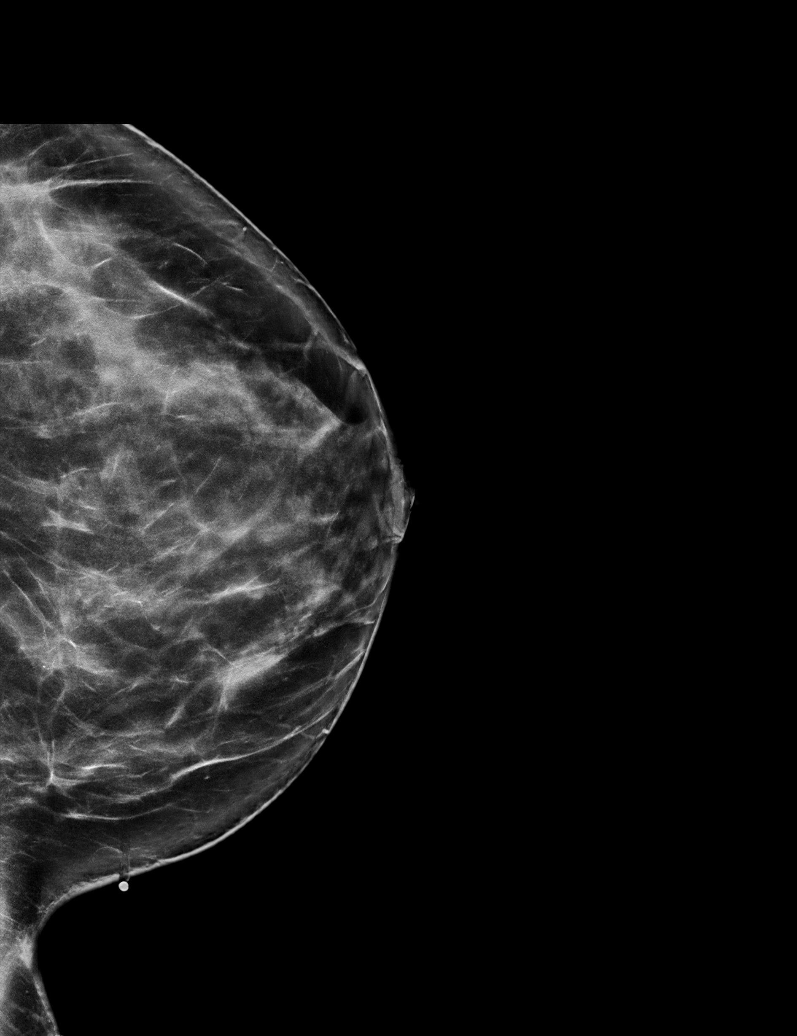

[6 of 36 positions shown; findings below may reference images not displayed]

Left
breast ultrasound 06/05/2013 and earlier. Right axillary ultrasound
01/06/2015.

ACR Breast Density Category c: The breast tissue is heterogeneously
dense, which may obscure small masses.
FINDINGS: Tomosynthesis and synthesized full field CC and MLO views of both
breasts were obtained. Tomosynthesis and synthesis size spot
tangential views of the site of focal pain in the right axilla and
the palpable lump in the left breast were also obtained.

Corresponding to the focal pain in the right axilla is a
circumscribed fat density superficial mass measuring approximately
1.2 x 1.4 cm, without associated architectural distortion or
suspicious calcifications. Scattered normal appearing lymph nodes
are present in the right axilla elsewhere.

No findings suspicious for malignancy in the right breast.

Corresponding to the area of palpable concern in the inner left
breast is a benign appearing oil cyst measuring approximately 6 mm,
without associated calcification or architectural distortion.

Post surgical scar/architectural distortion is present in the inner
left breast at posterior depth, unchanged. No findings suspicious
for malignancy in the left breast.

Mammographic images were processed with CAD.

On physical exam, do not palpate a discrete mass or pathologic
lymphadenopathy in the left axilla. The patient states mild
tenderness to palpation.

A mobile BB sized palpable superficial lump is present in the inner
left breast corresponding to what the patient is feeling.

Targeted left breast ultrasound is performed, showing an oval
circumscribed parallel superficial hypoechoic mass at the 8:30
o'clock position approximately 8 cm from the nipple measuring
approximately 5 x 4 x 6 mm, demonstrating posterior acoustic
enhancement and no internal power Doppler flow, corresponding to the
palpable concern. No suspicious solid mass or abnormal acoustic
shadowing is identified.

Right axillary ultrasound is performed, showing a superficial fat
containing mass measuring approximately 1.7 x 0.6 x 1.4 cm
corresponding to the palpable concern. Normal appearing lymph nodes
are present elsewhere in the right axilla. There is no pathologic
lymphadenopathy.
IMPRESSION: 1. No mammographic or sonographic evidence of malignancy involving
the left breast.
2. No mammographic evidence of malignancy involving the right
breast.
3. Benign 6 mm oil cyst in the superficial tissues of the inner left
breast which accounts for the palpable concern.
4. Benign superficial lipoma in the right axilla which accounts for
the palpable concern. No pathologic right axillary lymphadenopathy.

RECOMMENDATION:
Screening mammogram in one year.(Code:TO-4-QY4)

I have discussed the findings and recommendations with the patient.
Results were also provided in writing at the conclusion of the
visit. If applicable, a reminder letter will be sent to the patient
regarding the next appointment.

BI-RADS CATEGORY  2: Benign.

## 2018-09-09 IMAGING — US US AXILLARY RIGHT
2 series · 9 of 9 positions shown · non-contrast
Comparison: Mammography 01/10/2017, 01/10/2016 and earlier.

CLINICAL DATA: 41-year-old presenting with a palpable in the right
axilla which she has noticed for approximately 2 years, recently
becoming painful. She also has a palpable lump involving the inner
left breast at middle to posterior depth which she initially noticed
in July 2017. Prior benign excisional biopsies of both breasts
in 1771.

EXAM:
DIGITAL DIAGNOSTIC BILATERAL MAMMOGRAM WITH CAD AND TOMO
LIMITED ULTRASOUND LEFT BREAST
ULTRASOUND RIGHT AXILLA

[Series 1: us axillary right · 0.05mm/px · 3 of 3 slices shown (1 of 2)]
[im 1/3]
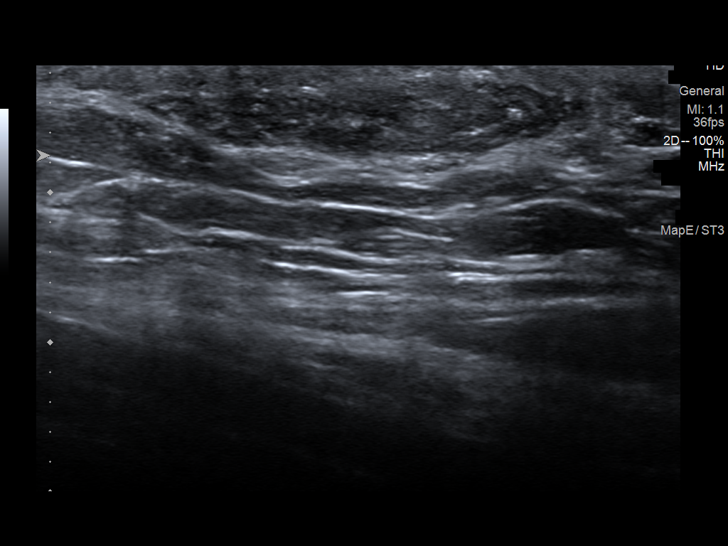
[im 2/3]
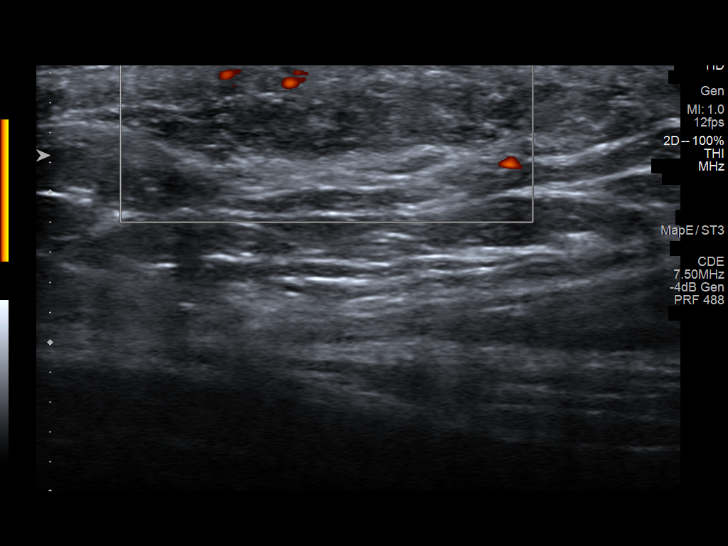
[im 3/3]
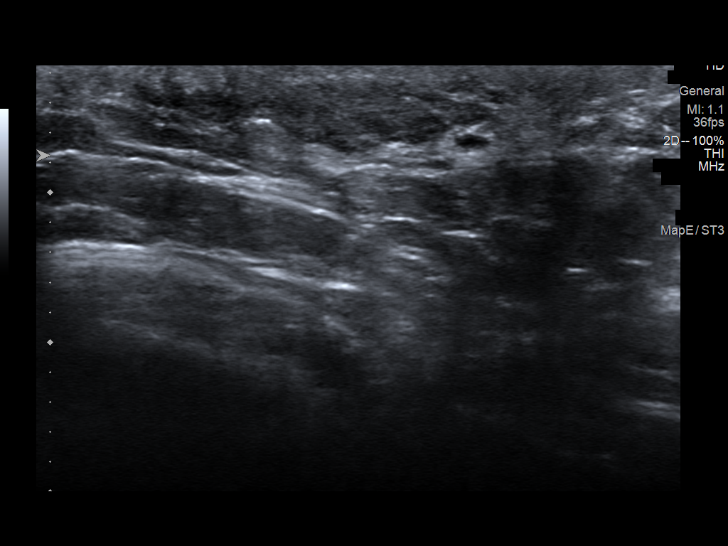

[Series 2: us axillary right · 0.07mm/px · 6 of 6 slices shown (2 of 2)]
[im 1/6]
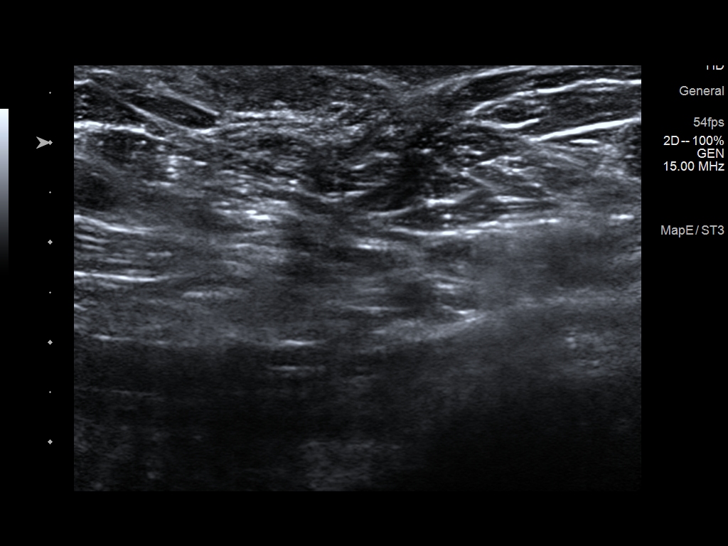
[im 2/6]
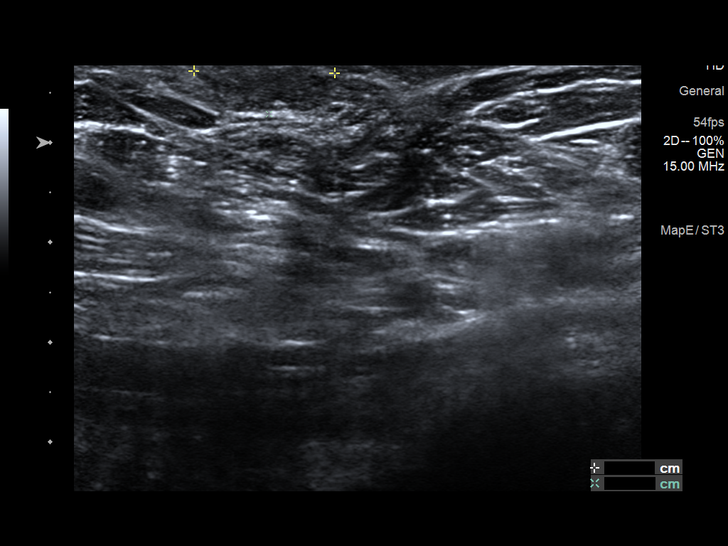
[im 3/6]
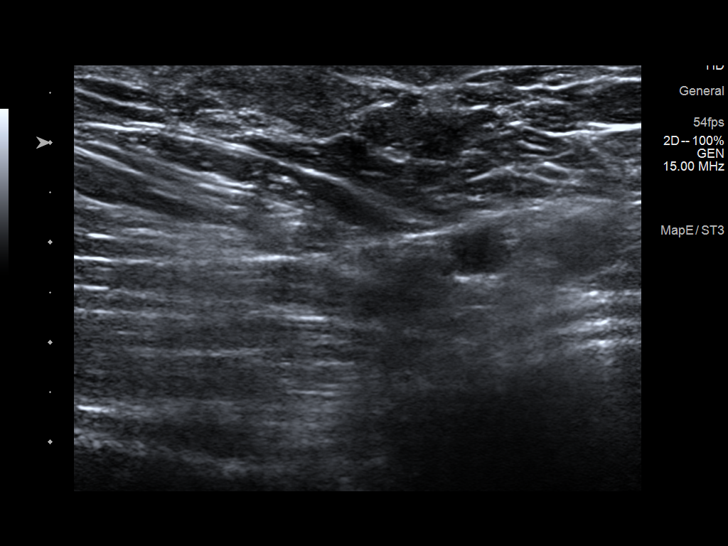
[im 4/6]
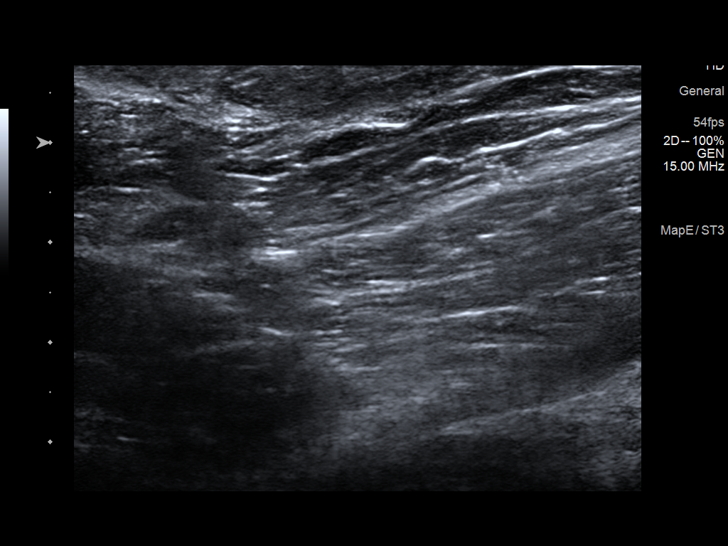
[im 5/6]
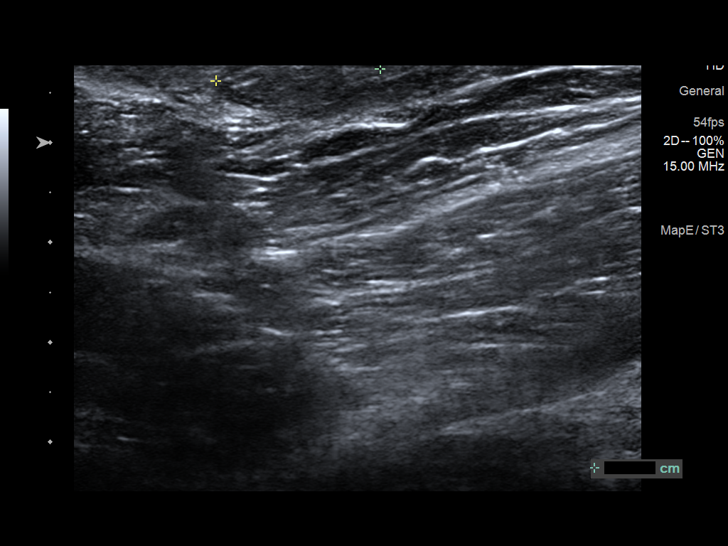
[im 6/6]
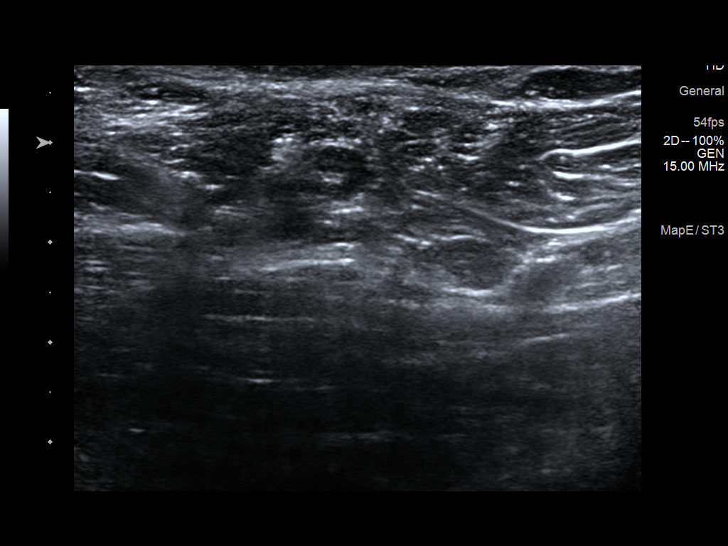

[9 of 9 positions shown; findings below may reference images not displayed]

Left
breast ultrasound 06/05/2013 and earlier. Right axillary ultrasound
01/06/2015.

ACR Breast Density Category c: The breast tissue is heterogeneously
dense, which may obscure small masses.
FINDINGS: Tomosynthesis and synthesized full field CC and MLO views of both
breasts were obtained. Tomosynthesis and synthesis size spot
tangential views of the site of focal pain in the right axilla and
the palpable lump in the left breast were also obtained.

Corresponding to the focal pain in the right axilla is a
circumscribed fat density superficial mass measuring approximately
1.2 x 1.4 cm, without associated architectural distortion or
suspicious calcifications. Scattered normal appearing lymph nodes
are present in the right axilla elsewhere.

No findings suspicious for malignancy in the right breast.

Corresponding to the area of palpable concern in the inner left
breast is a benign appearing oil cyst measuring approximately 6 mm,
without associated calcification or architectural distortion.

Post surgical scar/architectural distortion is present in the inner
left breast at posterior depth, unchanged. No findings suspicious
for malignancy in the left breast.

Mammographic images were processed with CAD.

On physical exam, do not palpate a discrete mass or pathologic
lymphadenopathy in the left axilla. The patient states mild
tenderness to palpation.

A mobile BB sized palpable superficial lump is present in the inner
left breast corresponding to what the patient is feeling.

Targeted left breast ultrasound is performed, showing an oval
circumscribed parallel superficial hypoechoic mass at the 8:30
o'clock position approximately 8 cm from the nipple measuring
approximately 5 x 4 x 6 mm, demonstrating posterior acoustic
enhancement and no internal power Doppler flow, corresponding to the
palpable concern. No suspicious solid mass or abnormal acoustic
shadowing is identified.

Right axillary ultrasound is performed, showing a superficial fat
containing mass measuring approximately 1.7 x 0.6 x 1.4 cm
corresponding to the palpable concern. Normal appearing lymph nodes
are present elsewhere in the right axilla. There is no pathologic
lymphadenopathy.
IMPRESSION: 1. No mammographic or sonographic evidence of malignancy involving
the left breast.
2. No mammographic evidence of malignancy involving the right
breast.
3. Benign 6 mm oil cyst in the superficial tissues of the inner left
breast which accounts for the palpable concern.
4. Benign superficial lipoma in the right axilla which accounts for
the palpable concern. No pathologic right axillary lymphadenopathy.

RECOMMENDATION:
Screening mammogram in one year.(Code:TO-4-QY4)

I have discussed the findings and recommendations with the patient.
Results were also provided in writing at the conclusion of the
visit. If applicable, a reminder letter will be sent to the patient
regarding the next appointment.

BI-RADS CATEGORY  2: Benign.

## 2018-09-11 DIAGNOSIS — Z981 Arthrodesis status: Secondary | ICD-10-CM | POA: Diagnosis not present

## 2018-09-11 DIAGNOSIS — M5136 Other intervertebral disc degeneration, lumbar region: Secondary | ICD-10-CM | POA: Diagnosis not present

## 2018-09-11 DIAGNOSIS — M545 Low back pain: Secondary | ICD-10-CM | POA: Diagnosis not present

## 2018-09-15 DIAGNOSIS — Z981 Arthrodesis status: Secondary | ICD-10-CM | POA: Diagnosis not present

## 2018-09-15 DIAGNOSIS — M545 Low back pain: Secondary | ICD-10-CM | POA: Diagnosis not present

## 2018-09-15 DIAGNOSIS — M5136 Other intervertebral disc degeneration, lumbar region: Secondary | ICD-10-CM | POA: Diagnosis not present

## 2018-09-18 DIAGNOSIS — Z719 Counseling, unspecified: Secondary | ICD-10-CM | POA: Diagnosis not present

## 2018-09-22 DIAGNOSIS — Z981 Arthrodesis status: Secondary | ICD-10-CM | POA: Diagnosis not present

## 2018-09-22 DIAGNOSIS — M5136 Other intervertebral disc degeneration, lumbar region: Secondary | ICD-10-CM | POA: Diagnosis not present

## 2018-09-22 DIAGNOSIS — M545 Low back pain: Secondary | ICD-10-CM | POA: Diagnosis not present

## 2018-09-23 DIAGNOSIS — Z981 Arthrodesis status: Secondary | ICD-10-CM | POA: Diagnosis not present

## 2018-09-23 DIAGNOSIS — M7631 Iliotibial band syndrome, right leg: Secondary | ICD-10-CM | POA: Diagnosis not present

## 2018-09-23 DIAGNOSIS — M5137 Other intervertebral disc degeneration, lumbosacral region: Secondary | ICD-10-CM | POA: Diagnosis not present

## 2018-09-24 DIAGNOSIS — Z719 Counseling, unspecified: Secondary | ICD-10-CM | POA: Diagnosis not present

## 2018-09-25 DIAGNOSIS — Z981 Arthrodesis status: Secondary | ICD-10-CM | POA: Diagnosis not present

## 2018-09-25 DIAGNOSIS — M545 Low back pain: Secondary | ICD-10-CM | POA: Diagnosis not present

## 2018-09-25 DIAGNOSIS — M5136 Other intervertebral disc degeneration, lumbar region: Secondary | ICD-10-CM | POA: Diagnosis not present

## 2018-09-30 DIAGNOSIS — Z719 Counseling, unspecified: Secondary | ICD-10-CM | POA: Diagnosis not present

## 2018-10-01 DIAGNOSIS — M545 Low back pain: Secondary | ICD-10-CM | POA: Diagnosis not present

## 2018-10-07 DIAGNOSIS — M545 Low back pain: Secondary | ICD-10-CM | POA: Diagnosis not present

## 2018-10-07 DIAGNOSIS — Z719 Counseling, unspecified: Secondary | ICD-10-CM | POA: Diagnosis not present

## 2018-10-09 DIAGNOSIS — M545 Low back pain: Secondary | ICD-10-CM | POA: Diagnosis not present

## 2018-10-14 DIAGNOSIS — Z719 Counseling, unspecified: Secondary | ICD-10-CM | POA: Diagnosis not present

## 2018-10-24 DIAGNOSIS — Z981 Arthrodesis status: Secondary | ICD-10-CM | POA: Diagnosis not present

## 2018-10-25 DIAGNOSIS — J209 Acute bronchitis, unspecified: Secondary | ICD-10-CM | POA: Diagnosis not present

## 2018-10-27 ENCOUNTER — Emergency Department (HOSPITAL_BASED_OUTPATIENT_CLINIC_OR_DEPARTMENT_OTHER): Payer: 59

## 2018-10-27 ENCOUNTER — Encounter (HOSPITAL_BASED_OUTPATIENT_CLINIC_OR_DEPARTMENT_OTHER): Payer: Self-pay | Admitting: Emergency Medicine

## 2018-10-27 ENCOUNTER — Other Ambulatory Visit: Payer: Self-pay

## 2018-10-27 ENCOUNTER — Emergency Department (HOSPITAL_BASED_OUTPATIENT_CLINIC_OR_DEPARTMENT_OTHER)
Admission: EM | Admit: 2018-10-27 | Discharge: 2018-10-27 | Disposition: A | Discharge status: Home / Self Care | Payer: 59 | Attending: Emergency Medicine | Admitting: Emergency Medicine

## 2018-10-27 DIAGNOSIS — Z79899 Other long term (current) drug therapy: Secondary | ICD-10-CM | POA: Diagnosis not present

## 2018-10-27 DIAGNOSIS — J209 Acute bronchitis, unspecified: Secondary | ICD-10-CM | POA: Diagnosis not present

## 2018-10-27 DIAGNOSIS — R0602 Shortness of breath: Secondary | ICD-10-CM | POA: Diagnosis not present

## 2018-10-27 MED ORDER — ALBUTEROL SULFATE (2.5 MG/3ML) 0.083% IN NEBU
5.0000 mg | INHALATION_SOLUTION | Freq: Once | RESPIRATORY_TRACT | Status: AC
  Administered 2018-10-27: 5 mg via RESPIRATORY_TRACT
  Filled 2018-10-27: qty 6

## 2018-10-27 MED ORDER — IPRATROPIUM BROMIDE 0.02 % IN SOLN
0.5000 mg | Freq: Once | RESPIRATORY_TRACT | Status: AC
  Administered 2018-10-27: 0.5 mg via RESPIRATORY_TRACT
  Filled 2018-10-27: qty 2.5

## 2018-10-27 MED ORDER — DOXYCYCLINE HYCLATE 100 MG PO TABS
100.0000 mg | ORAL_TABLET | Freq: Once | ORAL | Status: AC
  Administered 2018-10-27: 100 mg via ORAL
  Filled 2018-10-27: qty 1

## 2018-10-27 MED ORDER — DOXYCYCLINE HYCLATE 100 MG PO CAPS: 100.0000 mg | ORAL_CAPSULE | Freq: Two times a day (BID) | ORAL | 0 refills | Status: DC

## 2018-10-27 NOTE — ED Provider Notes (Signed)
[facility] EMERGENCY DEPARTMENT Provider Note   CSN: [id number] Arrival date & time: 10/27/18  1304    History   Chief Complaint Chief Complaint  Patient presents with  . Shortness of Breath  . Cough  . Fever    HPI Angel [name] is a 43 y.o. female.     HPI Patient is a 42 year old female who presents the emergency department with cough low-grade fever and chest discomfort.  No fever today.  She was recently diagnosed with bronchitis 3 days ago.  She states her cough is persistent despite albuterol inhaler and Tessalon Perles.  Cough is nonproductive.  No recent travel.  No recent contact with any [facility] patients or patients under investigation.  She does have a history of sickle cell trait.  She has 2 children with sickle cell disease.  No other sick contacts in the house.  She does work with the public.   Past Medical History:  Diagnosis Date  . Breast mass 06/2013   bilateral  . GERD (gastroesophageal reflux disease) 08/2014   takes Protonix daily  . Herniated disc   . Migraine headache   . Sickle cell trait (HCC)   . Vitamin D deficiency 2009    Patient Active Problem List   Diagnosis Date Noted  . Kidney cysts 05/07/2018  . Cough 04/03/2018  . Wheezing 04/03/2018  . URI (upper respiratory infection) 03/16/2018  . Acne 11/01/2017  . 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 CAP (community acquired pneumonia) 06/04/2017  . Fatigue 06/04/2017  . Neck pain 04/16/2017  . Obesity (BMI 30-39.9) 04/05/2017  . Impacted cerumen of right ear 01/19/2016  . GERD (gastroesophageal reflux disease) 06/02/2015  . Elevated glucose 10/18/2014  . Boil of trunk 06/08/2014  . Pain, abdominal, RLQ 10/16/2013  . Insomnia 10/07/2013  . Mass of multiple sites of left breast 06/08/2013  . Breast mass, right 06/08/2013  . Right lumbar radiculopathy 04/22/2013  . Low back pain 06/27/2012  . Bloating symptom 04/30/2012  . Migraine headache   . Left foot pain 09/19/2011   . Situational mixed anxiety and depressive disorder 06/18/2011  . Menorrhagia 06/18/2011  . Well adult exam 12/20/2010  . FREQUENCY, URINARY 02/16/2010  . PARESTHESIA 11/01/2008  . Chest pain, unspecified 03/26/2008  . WEIGHT GAIN 12/10/2007  . Vitamin D deficiency 09/03/2007  . INSOMNIA 05/31/2007  . Sickle-cell trait (HCC) 05/20/2007  . Migraine without aura 05/20/2007  . ALLERGIC RHINITIS 05/20/2007    Past Surgical History:  Procedure Laterality Date  . BILATERAL SALPINGECTOMY  09/26/2015   Procedure: BILATERAL SALPINGECTOMY;  Surgeon: Osborn Coho, MD;  Location: [facility];  Service: Gynecology;;  . BREAST BIOPSY Bilateral 07/13/2013   Procedure: BILATERAL EXCISION BREAS MASSES;  Surgeon: Shelly Rubenstein, MD;  Location: [facility];  Service: General;  Laterality: Bilateral;  . BREAST CYST EXCISION Left 2014   4 cysts removed  . DE QUERVAIN'S RELEASE Right 09/06/2003  . DILITATION & CURRETTAGE/HYSTROSCOPY WITH NOVASURE ABLATION N/A 09/26/2015   Procedure: DILATATION & CURETTAGE/HYSTEROSCOPY WITH NOVASURE ABLATION;  Surgeon: Osborn Coho, MD;  Location: [facility];  Service: Gynecology;  Laterality: N/A;  . LAPAROSCOPIC OVARIAN CYSTECTOMY Right 09/26/2015   Procedure: LAPAROSCOPIC Right OVARIAN CYSTECTOMY, Bilateral Peritoneal Biopsies over Uteralsacral area;  Surgeon: Osborn Coho, MD;  Location: [facility];  Service: Gynecology;  Laterality: Right;  . TUBAL LIGATION  12/27/2005  . WISDOM TOOTH EXTRACTION    . WRIST GANGLION EXCISION Right 09/06/2003  . WRIST GANGLION EXCISION Left 09/04/1999  OB History    Gravida  2   Para  2   Term  2   Preterm      AB      Living  2     SAB      TAB      Ectopic      Multiple      Live Births               Home Medications    Prior to Admission medications   Medication Sig Start Date End Date Taking? Authorizing Provider  albuterol (PROVENTIL HFA;VENTOLIN HFA) 108 (90 Base) MCG/ACT inhaler Inhale 2  puffs into the lungs every 6 (six) hours as needed for wheezing or shortness of breath. 04/03/18   Corwin Levins, MD  Cholecalciferol (VITAMIN D3) 2000 units capsule Take 1 capsule (2,000 Units total) by mouth daily. 05/03/17   Plotnikov, Georgina Quint, MD  clindamycin (CLINDAGEL) 1 % gel Apply 1 application topically daily.  07/21/14   [provider]  doxycycline (VIBRAMYCIN) 100 MG capsule Take 1 capsule (100 mg total) by mouth 2 (two) times daily. 10/27/18   Azalia Bilis, MD  hydrocortisone (ANUSOL-HC) 25 MG suppository Place 1 suppository (25 mg total) rectally 2 (two) times daily as needed for hemorrhoids or itching. 11/26/16   Nche, Bonna Gains, NP  ibuprofen (ADVIL,MOTRIN) 800 MG tablet Take 1 tablet (800 mg total) by mouth every 8 (eight) hours as needed. 11/26/16   Nche, Bonna Gains, NP  pregabalin (LYRICA) 50 MG capsule Take by mouth. 03/19/18   [provider]  spironolactone (ALDACTONE) 50 MG tablet  05/06/18   [provider]  SUMAtriptan (IMITREX) 100 MG tablet Take 1 tablet (100 mg total) by mouth every 2 (two) hours as needed for migraine. May repeat in 2 hours if headache persists or recurs. 05/05/18   Plotnikov, Georgina Quint, MD  triamcinolone cream (KENALOG) 0.1 % triamcinolone acetonide 0.1 % topical cream    [provider]    Family History Family History  Problem Relation Age of Onset  . Hypertension Father   . Diabetes Father   . Heart disease Father   . Cancer Father        prostate  . Sickle cell anemia Daughter   . Sickle cell anemia Son     Social History Social History   Tobacco Use  . Smoking status: Never Smoker  . Smokeless tobacco: Never Used  Substance Use Topics  . Alcohol use: No  . Drug use: No     Allergies   Patient has no known allergies.   Review of Systems Review of Systems  All other systems reviewed and are negative.    Physical Exam Updated Vital Signs BP 118/76 (BP Location: Left Arm)   Pulse 100    Temp 98.4 F (36.9 C) (Oral)   Resp (!) 22   Ht 5\' 6"  (1.676 m)   Wt 88.5 kg   LMP  (LMP Unknown)   SpO2 100%   BMI 31.47 kg/m   Physical Exam Vitals signs and nursing note reviewed.  Constitutional:      General: She is not in acute distress.    Appearance: She is well-developed.  HENT:     Head: Normocephalic and atraumatic.  Neck:     Musculoskeletal: Normal range of motion.  Cardiovascular:     Rate and Rhythm: Normal rate and regular rhythm.     Heart sounds: Normal heart sounds.  Pulmonary:  Effort: Pulmonary effort is normal.     Breath sounds: Normal breath sounds.  Abdominal:     General: There is no distension.     Palpations: Abdomen is soft.     Tenderness: There is no abdominal tenderness.  Musculoskeletal: Normal range of motion.  Skin:    General: Skin is warm and dry.  Neurological:     Mental Status: She is alert and oriented to person, place, and time.  Psychiatric:        Judgment: Judgment normal.      ED Treatments / Results  Labs (all labs ordered are listed, but only abnormal results are displayed) Labs Reviewed - No data to display  EKG None  Radiology Dg Chest 2 View  Result Date: 10/27/2018 CLINICAL DATA:  Pt here with cough, fever and chest discomfort. Pt is afebrile today and got a dx of bronchitis at urgent care on Saturday, hx of GERD, no other complaintscough, sob EXAM: CHEST - 2 VIEW COMPARISON:  None. FINDINGS: Normal mediastinum and cardiac silhouette. Normal pulmonary vasculature. No evidence of effusion, infiltrate, or pneumothorax. No acute bony abnormality. IMPRESSION: No acute cardiopulmonary process. Electronically Signed   By: Genevive Bi M.D.   On: 10/27/2018 13:42    Procedures Procedures (including critical care time)  Medications Ordered in ED Medications  albuterol (PROVENTIL) (2.5 MG/3ML) 0.083% nebulizer solution 5 mg (5 mg Nebulization Given 10/27/18 1423)  ipratropium (ATROVENT) nebulizer solution  0.5 mg (0.5 mg Nebulization Given 10/27/18 1423)  doxycycline (VIBRA-TABS) tablet 100 mg (100 mg Oral Given 10/27/18 1441)     Initial Impression / Assessment and Plan / ED Course  I have reviewed the triage vital signs and the nursing notes.  Pertinent labs & imaging results that were available during my care of the patient were reviewed by me and considered in my medical decision making (see chart for details).        Vital signs stable here in the emergency department.  No hypoxia.  Overall well-appearing.  Suspect viral URI.  No indication at this time for testing for coronavirus.  Discharged home in good condition.  Primary care follow-up.  Patient understands return to the ER for new or worsening symptoms home with Doxy.  Final Clinical Impressions(s) / ED Diagnoses   Final diagnoses:  Acute bronchitis, unspecified organism    ED Discharge Orders         Ordered    doxycycline (VIBRAMYCIN) 100 MG capsule  2 times daily     10/27/18 1522           Azalia Bilis, MD 10/27/18 1545

## 2018-10-27 NOTE — ED Triage Notes (Signed)
Pt here with cough, fever and chest discomfort. Pt is afebrile today and got a dx of bronchitis at urgent care on Saturday

## 2018-10-28 DIAGNOSIS — Z719 Counseling, unspecified: Secondary | ICD-10-CM | POA: Diagnosis not present

## 2018-10-29 ENCOUNTER — Ambulatory Visit (INDEPENDENT_AMBULATORY_CARE_PROVIDER_SITE_OTHER): Payer: 59 | Admitting: Internal Medicine

## 2018-10-29 ENCOUNTER — Encounter: Payer: Self-pay | Admitting: Internal Medicine

## 2018-10-29 ENCOUNTER — Other Ambulatory Visit: Payer: Self-pay

## 2018-10-29 VITALS — BP 116/78 | HR 70 | Temp 98.4°F | Ht 66.0 in | Wt 200.0 lb

## 2018-10-29 DIAGNOSIS — J069 Acute upper respiratory infection, unspecified: Secondary | ICD-10-CM | POA: Diagnosis not present

## 2018-10-29 DIAGNOSIS — J101 Influenza due to other identified influenza virus with other respiratory manifestations: Secondary | ICD-10-CM

## 2018-10-29 DIAGNOSIS — R6889 Other general symptoms and signs: Secondary | ICD-10-CM

## 2018-10-29 DIAGNOSIS — Z0000 Encounter for general adult medical examination without abnormal findings: Secondary | ICD-10-CM

## 2018-10-29 LAB — POC INFLUENZA A&B (BINAX/QUICKVUE)
INFLUENZA B, POC: NEGATIVE
Influenza A, POC: POSITIVE — AB

## 2018-10-29 MED ORDER — HYDROCODONE-HOMATROPINE 5-1.5 MG/5ML PO SYRP: 5.0000 mL | ORAL_SOLUTION | Freq: Four times a day (QID) | ORAL | 0 refills | Status: DC | PRN

## 2018-10-29 MED ORDER — HYDROCORTISONE ACETATE 25 MG RE SUPP: 25.0000 mg | Freq: Two times a day (BID) | RECTAL | 1 refills | Status: DC

## 2018-10-29 MED ORDER — HYDROCORTISONE 2.5 % RE CREA: TOPICAL_CREAM | RECTAL | 1 refills | Status: AC

## 2018-10-29 MED ORDER — OSELTAMIVIR PHOSPHATE 75 MG PO CAPS: 75.0000 mg | ORAL_CAPSULE | Freq: Two times a day (BID) | ORAL | 0 refills | Status: DC

## 2018-10-29 NOTE — Assessment & Plan Note (Signed)
-  Tamiflu 

## 2018-10-29 NOTE — Assessment & Plan Note (Signed)
We discussed age appropriate health related issues, including available/recomended screening tests and vaccinations. We discussed a need for adhering to healthy diet and exercise. Labs were ordered to be later reviewed . All questions were answered.   

## 2018-10-29 NOTE — Assessment & Plan Note (Signed)
Hycodan syr

## 2018-10-29 NOTE — Patient Instructions (Addendum)
You can use over-the-counter  "cold" medicines  such as "Tylenol cold" , "Advil cold",  "Mucinex" or" Mucinex D"  for cough and congestion.   Avoid decongestants if you have high blood pressure and use "Afrin" nasal spray for nasal congestion as directed. Use " Delsym" or" Robitussin" cough syrup varietis for cough.  You can use plain "Tylenol" or "Advil" for fever, chills and achyness. Use Halls or Ricola cough drops.   Please, make an appointment if you are not better or if you're worse.  

## 2018-10-29 NOTE — Progress Notes (Signed)
Subjective:  Patient ID: Angel [name], female    DOB: 09-Jul-1976  Age: 43 y.o. MRN: [id number]  CC: No chief complaint on file.   HPI Angel [name] presents for acute bronchitis - now on Doxy and Tessalon, MDIs Well exam  Outpatient Medications Prior to Visit  Medication Sig Dispense Refill  . acetaminophen (TYLENOL) 500 MG tablet Take 500 mg by mouth every 6 (six) hours as needed.    Marland Kitchen albuterol (PROVENTIL HFA;VENTOLIN HFA) 108 (90 Base) MCG/ACT inhaler Inhale 2 puffs into the lungs every 6 (six) hours as needed for wheezing or shortness of breath. 1 Inhaler 1  . Cholecalciferol (VITAMIN D3) 2000 units capsule Take 1 capsule (2,000 Units total) by mouth daily. 100 capsule 3  . clindamycin (CLINDAGEL) 1 % gel Apply 1 application topically daily.   11  . doxycycline (VIBRAMYCIN) 100 MG capsule Take 1 capsule (100 mg total) by mouth 2 (two) times daily. 14 capsule 0  . hydrocortisone (ANUSOL-HC) 25 MG suppository Place 1 suppository (25 mg total) rectally 2 (two) times daily as needed for hemorrhoids or itching. 20 suppository 0  . ibuprofen (ADVIL,MOTRIN) 800 MG tablet Take 1 tablet (800 mg total) by mouth every 8 (eight) hours as needed. 30 tablet 0  . spironolactone (ALDACTONE) 50 MG tablet     . SUMAtriptan (IMITREX) 100 MG tablet Take 1 tablet (100 mg total) by mouth every 2 (two) hours as needed for migraine. May repeat in 2 hours if headache persists or recurs. 12 tablet 3  . triamcinolone cream (KENALOG) 0.1 % triamcinolone acetonide 0.1 % topical cream    . pregabalin (LYRICA) 50 MG capsule Take by mouth.     No facility-administered medications prior to visit.     ROS: Review of Systems  Constitutional: Negative for activity change, appetite change, chills, fatigue and unexpected weight change.  HENT: Negative for congestion, mouth sores and sinus pressure.   Eyes: Negative for visual disturbance.  Respiratory: Positive for cough, shortness of breath and wheezing.  Negative for chest tightness.   Gastrointestinal: Negative for abdominal pain and nausea.  Genitourinary: Negative for difficulty urinating, frequency and vaginal pain.  Musculoskeletal: Negative for back pain and gait problem.  Skin: Negative for pallor and rash.  Neurological: Negative for dizziness, tremors, weakness, numbness and headaches.  Psychiatric/Behavioral: Negative for confusion, sleep disturbance and suicidal ideas.    Objective:  BP 116/78 (BP Location: Left Arm, Patient Position: Sitting, Cuff Size: Normal)   Pulse 70   Temp 98.4 F (36.9 C) (Oral)   Ht 5\' 6"  (1.676 m)   Wt 200 lb (90.7 kg)   LMP  (LMP Unknown)   SpO2 98%   BMI 32.28 kg/m   BP Readings from Last 3 Encounters:  10/29/18 116/78  10/27/18 118/76  05/07/18 118/66    Wt Readings from Last 3 Encounters:  10/29/18 200 lb (90.7 kg)  10/27/18 195 lb (88.5 kg)  05/07/18 195 lb (88.5 kg)    Physical Exam Constitutional:      General: She is not in acute distress.    Appearance: She is well-developed.  HENT:     Head: Normocephalic.     Right Ear: External ear normal.     Left Ear: External ear normal.     Nose: Nose normal.  Eyes:     General:        Right eye: No discharge.        Left eye: No discharge.  Conjunctiva/sclera: Conjunctivae normal.     Pupils: Pupils are equal, round, and reactive to light.  Neck:     Musculoskeletal: Normal range of motion and neck supple.     Thyroid: No thyromegaly.     Vascular: No JVD.     Trachea: No tracheal deviation.  Cardiovascular:     Rate and Rhythm: Normal rate and regular rhythm.     Heart sounds: Normal heart sounds.  Pulmonary:     Effort: No respiratory distress.     Breath sounds: No stridor. No wheezing.  Abdominal:     General: Bowel sounds are normal. There is no distension.     Palpations: Abdomen is soft. There is no mass.     Tenderness: There is no abdominal tenderness. There is no guarding or rebound.  Musculoskeletal:         General: No tenderness.  Lymphadenopathy:     Cervical: No cervical adenopathy.  Skin:    Findings: No erythema or rash.  Neurological:     Cranial Nerves: No cranial nerve deficit.     Motor: No abnormal muscle tone.     Coordination: Coordination normal.     Deep Tendon Reflexes: Reflexes normal.  Psychiatric:        Behavior: Behavior normal.        Thought Content: Thought content normal.        Judgment: Judgment normal.     Lab Results  Component Value Date   WBC 5.1 05/03/2017   HGB 13.0 05/03/2017   HCT 39.7 05/03/2017   PLT 227.0 05/03/2017   GLUCOSE 94 11/01/2017   CHOL 179 05/03/2017   TRIG 45.0 05/03/2017   HDL 74.50 05/03/2017   LDLCALC 96 05/03/2017   ALT 13 11/01/2017   AST 13 11/01/2017   NA 140 11/01/2017   K 4.3 11/01/2017   CL 109 11/01/2017   CREATININE 0.85 11/01/2017   BUN 15 11/01/2017   CO2 25 11/01/2017   TSH 1.94 05/03/2017   HGBA1C 5.6 10/18/2014    Dg Chest 2 View  Result Date: 10/27/2018 CLINICAL DATA:  Pt here with cough, fever and chest discomfort. Pt is afebrile today and got a dx of bronchitis at urgent care on Saturday, hx of GERD, no other complaintscough, sob EXAM: CHEST - 2 VIEW COMPARISON:  None. FINDINGS: Normal mediastinum and cardiac silhouette. Normal pulmonary vasculature. No evidence of effusion, infiltrate, or pneumothorax. No acute bony abnormality. IMPRESSION: No acute cardiopulmonary process. Electronically Signed   By: Genevive Bi M.D.   On: 10/27/2018 13:42    Assessment & Plan:   There are no diagnoses linked to this encounter.   No orders of the defined types were placed in this encounter.    Follow-up: No follow-ups on file.  Sonda Primes, MD

## 2018-10-29 NOTE — Addendum Note (Signed)
Addended by: Scarlett Presto on: 10/29/2018 10:00 AM   Modules accepted: Orders

## 2018-10-30 ENCOUNTER — Emergency Department (HOSPITAL_BASED_OUTPATIENT_CLINIC_OR_DEPARTMENT_OTHER)
Admission: EM | Admit: 2018-10-30 | Discharge: 2018-10-30 | Disposition: A | Discharge status: Home / Self Care | Payer: 59 | Attending: Emergency Medicine | Admitting: Emergency Medicine

## 2018-10-30 ENCOUNTER — Encounter (HOSPITAL_BASED_OUTPATIENT_CLINIC_OR_DEPARTMENT_OTHER): Payer: Self-pay | Admitting: *Deleted

## 2018-10-30 ENCOUNTER — Other Ambulatory Visit: Payer: Self-pay

## 2018-10-30 ENCOUNTER — Ambulatory Visit: Payer: Self-pay

## 2018-10-30 DIAGNOSIS — R002 Palpitations: Secondary | ICD-10-CM | POA: Diagnosis not present

## 2018-10-30 DIAGNOSIS — R000 Tachycardia, unspecified: Secondary | ICD-10-CM | POA: Diagnosis present

## 2018-10-30 DIAGNOSIS — J101 Influenza due to other identified influenza virus with other respiratory manifestations: Secondary | ICD-10-CM | POA: Insufficient documentation

## 2018-10-30 DIAGNOSIS — Z79899 Other long term (current) drug therapy: Secondary | ICD-10-CM | POA: Diagnosis not present

## 2018-10-30 DIAGNOSIS — J111 Influenza due to unidentified influenza virus with other respiratory manifestations: Secondary | ICD-10-CM

## 2018-10-30 NOTE — ED Triage Notes (Signed)
She saw her MD yesterday and had a positive flu test. She is here today for tachycardia after walking up the stairs at home. She has not had fever reducer today.

## 2018-10-30 NOTE — Telephone Encounter (Signed)
Pt called stating that she has been sick since last Saturday.   She states that she was in the office and started on tamaflu yesterday. She states that about 20 minutes ago she went to open her blinds in her bedroom and became very SOB and she felt her heart beating really fast.  She has a watch that measures HR. She has been 166-141 consistently. She feels jittery and SOB.  She denies Chest pain.  She has no fever.  She states that she is taking prednisone given to her on Saturday at urgent care. Pt also has Augmentin on med list.  She states she stopped taking when place on Tamaflu yesterday. Pt will go to ER per protocol. Care advice read to patient. Pt verbalized understanding of all instructions.  Reason for Disposition . [1] Heart beating very rapidly (e.g., > 140 / minute) AND [2] present now  (Exception: during exercise)  Answer Assessment - Initial Assessment Questions 1. DESCRIPTION: "Please describe your heart rate or heart beat that you are having" (e.g., fast/slow, regular/irregular, skipped or extra beats, "palpitations")     To fast 166 142 2. ONSET: "When did it start?" (Minutes, hours or days)     20 minutes ago 3. DURATION: "How long does it last" (e.g., seconds, minutes, hours)     20 minutes ago 4. PATTERN "Does it come and go, or has it been constant since it started?"  "Does it get worse with exertion?"   "Are you feeling it now?"    Yes  Somewhat gittery 5. TAP: "Using your hand, can you tap out what you are feeling on a chair or table in front of you, so that I can hear?" (Note: not all patients can do this)      Fast beat 6. HEART RATE: "Can you tell me your heart rate?" "How many beats in 15 seconds?"  (Note: not all patients can do this)     166 142 7. RECURRENT SYMPTOM: "Have you ever had this before?" If so, ask: "When was the last time?" and "What happened that time?"      No 8. CAUSE: "What do you think is causing the palpitations?"     SOB since climbing  stairs 9. CARDIAC HISTORY: "Do you have any history of heart disease?" (e.g., heart attack, angina, bypass surgery, angioplasty, arrhythmia)     no 10. OTHER SYMPTOMS: "Do you have any other symptoms?" (e.g., dizziness, chest pain, sweating, difficulty breathing)     No dizziness or pain just SOB 11. PREGNANCY: "Is there any chance you are pregnant?" "When was your last menstrual period?"       No Ablasion  Protocols used: HEART RATE AND HEARTBEAT QUESTIONS-A-AH

## 2018-10-30 NOTE — Telephone Encounter (Signed)
Pt at ED.

## 2018-10-30 NOTE — ED Provider Notes (Signed)
[facility] EMERGENCY DEPARTMENT Provider Note   CSN: [id number] Arrival date & time: 10/30/18  1449    History   Chief Complaint Chief Complaint  Patient presents with  . Tachycardia    HPI Angel [name] is a 43 y.o. female.     Patient is a 43 year old female who presents with tachycardia.  She was recently diagnosed with bronchitis and most recently the flu which was diagnosed yesterday by a positive rapid flu test for influenza A.  She states she was walking up her stairs this morning and had an onset of tachycardia.  She felt like her heart was racing and became short of breath.  She said this lasted intermittently for about 30 minutes.  It was alternating between 166 and 120s.  She has no history of prior arrhythmias.  She states that she feels like she has been drinking okay.  She is had no vomiting.  She denies any other episodes of shortness of breath other than with coughing spells.  She does have some cough medicine at home that she has been using.  She has not had any noted fevers today.  She has not taken any medications today.  She is currently asymptomatic.     Past Medical History:  Diagnosis Date  . Breast mass 06/2013   bilateral  . GERD (gastroesophageal reflux disease) 08/2014   takes Protonix daily  . Herniated disc   . Migraine headache   . Sickle cell trait (HCC)   . Vitamin D deficiency 2009    Patient Active Problem List   Diagnosis Date Noted  . Influenza A 10/29/2018  . Kidney cysts 05/07/2018  . Cough 04/03/2018  . Wheezing 04/03/2018  . URI (upper respiratory infection) 03/16/2018  . Acne 11/01/2017  . 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 CAP (community acquired pneumonia) 06/04/2017  . Fatigue 06/04/2017  . Neck pain 04/16/2017  . Obesity (BMI 30-39.9) 04/05/2017  . Impacted cerumen of right ear 01/19/2016  . GERD (gastroesophageal reflux disease) 06/02/2015  . Elevated glucose 10/18/2014  . Boil of trunk 06/08/2014  .  Pain, abdominal, RLQ 10/16/2013  . Insomnia 10/07/2013  . Mass of multiple sites of left breast 06/08/2013  . Breast mass, right 06/08/2013  . Right lumbar radiculopathy 04/22/2013  . Low back pain 06/27/2012  . Bloating symptom 04/30/2012  . Migraine headache   . Left foot pain 09/19/2011  . Situational mixed anxiety and depressive disorder 06/18/2011  . Menorrhagia 06/18/2011  . Well adult exam 12/20/2010  . FREQUENCY, URINARY 02/16/2010  . PARESTHESIA 11/01/2008  . Chest pain, unspecified 03/26/2008  . WEIGHT GAIN 12/10/2007  . Vitamin D deficiency 09/03/2007  . INSOMNIA 05/31/2007  . Sickle-cell trait (HCC) 05/20/2007  . Migraine without aura 05/20/2007  . ALLERGIC RHINITIS 05/20/2007    Past Surgical History:  Procedure Laterality Date  . BILATERAL SALPINGECTOMY  09/26/2015   Procedure: BILATERAL SALPINGECTOMY;  Surgeon: Osborn Coho, MD;  Location: [facility];  Service: Gynecology;;  . BREAST BIOPSY Bilateral 07/13/2013   Procedure: BILATERAL EXCISION BREAS MASSES;  Surgeon: Shelly Rubenstein, MD;  Location: [facility];  Service: General;  Laterality: Bilateral;  . BREAST CYST EXCISION Left 2014   4 cysts removed  . DE QUERVAIN'S RELEASE Right 09/06/2003  . DILITATION & CURRETTAGE/HYSTROSCOPY WITH NOVASURE ABLATION N/A 09/26/2015   Procedure: DILATATION & CURETTAGE/HYSTEROSCOPY WITH NOVASURE ABLATION;  Surgeon: Osborn Coho, MD;  Location: [facility];  Service: Gynecology;  Laterality: N/A;  . LAPAROSCOPIC OVARIAN CYSTECTOMY  Right 09/26/2015   Procedure: LAPAROSCOPIC Right OVARIAN CYSTECTOMY, Bilateral Peritoneal Biopsies over Uteralsacral area;  Surgeon: Osborn Coho, MD;  Location: [facility];  Service: Gynecology;  Laterality: Right;  . SPINAL FUSION  07/05/2018  . TUBAL LIGATION  12/27/2005  . WISDOM TOOTH EXTRACTION    . WRIST GANGLION EXCISION Right 09/06/2003  . WRIST GANGLION EXCISION Left 09/04/1999     OB History    Gravida  2   Para  2   Term  2    Preterm      AB      Living  2     SAB      TAB      Ectopic      Multiple      Live Births               Home Medications    Prior to Admission medications   Medication Sig Start Date End Date Taking? Authorizing Provider  acetaminophen (TYLENOL) 500 MG tablet Take 500 mg by mouth every 6 (six) hours as needed.   Yes [provider]  albuterol (PROVENTIL HFA;VENTOLIN HFA) 108 (90 Base) MCG/ACT inhaler Inhale 2 puffs into the lungs every 6 (six) hours as needed for wheezing or shortness of breath. 04/03/18  Yes Corwin Levins, MD  clindamycin (CLINDAGEL) 1 % gel Apply 1 application topically daily.  07/21/14  Yes [provider]  doxycycline (VIBRAMYCIN) 100 MG capsule Take 1 capsule (100 mg total) by mouth 2 (two) times daily. 10/27/18  Yes Azalia Bilis, MD  HYDROcodone-homatropine [facility]) 5-1.5 MG/5ML syrup Take 5 mLs by mouth every 6 (six) hours as needed for cough. 10/29/18  Yes Plotnikov, Georgina Quint, MD  hydrocortisone (ANUSOL-HC) 2.5 % rectal cream Use bid 10/29/18  Yes Plotnikov, Georgina Quint, MD  hydrocortisone (ANUSOL-HC) 25 MG suppository Place 1 suppository (25 mg total) rectally 2 (two) times daily as needed for hemorrhoids or itching. 11/26/16  Yes Nche, Bonna Gains, NP  hydrocortisone (ANUSOL-HC) 25 MG suppository Place 1 suppository (25 mg total) rectally 2 (two) times daily. 10/29/18 10/29/19 Yes Plotnikov, Georgina Quint, MD  ibuprofen (ADVIL,MOTRIN) 800 MG tablet Take 1 tablet (800 mg total) by mouth every 8 (eight) hours as needed. 11/26/16  Yes Nche, Bonna Gains, NP  oseltamivir (TAMIFLU) 75 MG capsule Take 1 capsule (75 mg total) by mouth 2 (two) times daily. 10/29/18  Yes Plotnikov, Georgina Quint, MD  spironolactone (ALDACTONE) 50 MG tablet  05/06/18  Yes [provider]  SUMAtriptan (IMITREX) 100 MG tablet Take 1 tablet (100 mg total) by mouth every 2 (two) hours as needed for migraine. May repeat in 2 hours if headache persists or recurs. 05/05/18   Yes Plotnikov, Georgina Quint, MD  triamcinolone cream (KENALOG) 0.1 % triamcinolone acetonide 0.1 % topical cream   Yes [provider]  Cholecalciferol (VITAMIN D3) 2000 units capsule Take 1 capsule (2,000 Units total) by mouth daily. 05/03/17   Plotnikov, Georgina Quint, MD    Family History Family History  Problem Relation Age of Onset  . Hypertension Father   . Diabetes Father   . Heart disease Father   . Cancer Father        prostate  . Sickle cell anemia Daughter   . Sickle cell anemia Son     Social History Social History   Tobacco Use  . Smoking status: Never Smoker  . Smokeless tobacco: Never Used  Substance Use Topics  . Alcohol use: No  . Drug use:  No     Allergies   Patient has no known allergies.   Review of Systems Review of Systems  Constitutional: Positive for fatigue (Associated with the flu) and fever (None today). Negative for chills and diaphoresis.  HENT: Positive for congestion, postnasal drip and rhinorrhea.   Eyes: Negative.   Respiratory: Positive for shortness of breath. Negative for cough and chest tightness.   Cardiovascular: Positive for palpitations. Negative for chest pain and leg swelling.  Gastrointestinal: Negative for abdominal pain, blood in stool, diarrhea, nausea and vomiting.  Genitourinary: Negative for difficulty urinating, flank pain, frequency and hematuria.  Musculoskeletal: Negative for arthralgias and back pain.  Skin: Negative for rash.  Neurological: Negative for dizziness, speech difficulty, weakness, numbness and headaches.     Physical Exam Updated Vital Signs BP (!) 145/92   Pulse 83   Temp 98.1 F (36.7 C) (Oral)   Resp 16   Ht  (1.676 m)   Wt 90.7 kg   LMP  (LMP Unknown)   SpO2 100%   BMI 32.27 kg/m   Physical Exam Constitutional:      Appearance: She is well-developed.  HENT:     Head: Normocephalic and atraumatic.  Eyes:     Pupils: Pupils are equal, round, and reactive to light.  Neck:      Musculoskeletal: Normal range of motion and neck supple.  Cardiovascular:     Rate and Rhythm: Normal rate and regular rhythm.     Heart sounds: Normal heart sounds.  Pulmonary:     Effort: Pulmonary effort is normal. No respiratory distress.     Breath sounds: Normal breath sounds. No wheezing or rales.  Chest:     Chest wall: No tenderness.  Abdominal:     General: Bowel sounds are normal.     Palpations: Abdomen is soft.     Tenderness: There is no abdominal tenderness. There is no guarding or rebound.  Musculoskeletal: Normal range of motion.  Lymphadenopathy:     Cervical: No cervical adenopathy.  Skin:    General: Skin is warm and dry.     Findings: No rash.  Neurological:     Mental Status: She is alert and oriented to person, place, and time.      ED Treatments / Results  Labs (all labs ordered are listed, but only abnormal results are displayed) Labs Reviewed - No data to display  EKG None   ED ECG REPORT   Date: 10/30/2018  Rate: 80  Rhythm: normal sinus rhythm  QRS Axis: normal  Intervals: normal  ST/T Wave abnormalities: normal  Conduction Disutrbances:none  Narrative Interpretation:   Old EKG Reviewed: unchanged  I have personally reviewed the EKG tracing and agree with the computerized printout as noted.   Radiology No results found.  Procedures Procedures (including critical care time)  Medications Ordered in ED Medications - No data to display   Initial Impression / Assessment and Plan / ED Course  I have reviewed the triage vital signs and the nursing notes.  Pertinent labs & imaging results that were available during my care of the patient were reviewed by me and considered in my medical decision making (see chart for details).       Patient is a 43 year old female who presents with a episode of palpitations earlier today while walking upstairs.  She has not had any further episodes.  She is currently asymptomatic as far as no  chest pain or shortness of breath.  She has no  hypoxia.  No increased work of breathing.  Her lungs are clear on exam.  She has had a recent chest x-ray which was negative.  She is currently afebrile with a normal heart rate and normal blood pressure.  Her EKG does not show any arrhythmias.  She was discharged home in good condition.  She was advised in symptomatic care.  If her symptoms continue, she should follow-up with her PCP for possible Holter monitoring although I feel like her episode was likely related to her underlying influenza.  Return precautions were given.   Final Clinical Impressions(s) / ED Diagnoses   Final diagnoses:  Palpitations  Influenza    ED Discharge Orders    None       Rolan Bucco, MD 10/30/18 1600

## 2018-10-30 NOTE — ED Notes (Signed)
Pt verbalized understanding of dc instructions.

## 2018-11-06 ENCOUNTER — Ambulatory Visit: Payer: 59 | Admitting: Internal Medicine

## 2018-11-06 ENCOUNTER — Other Ambulatory Visit: Payer: Self-pay | Admitting: Internal Medicine

## 2018-11-06 MED ORDER — BENZONATATE 200 MG PO CAPS: 200.0000 mg | ORAL_CAPSULE | Freq: Three times a day (TID) | ORAL | 0 refills | Status: DC | PRN

## 2018-11-26 ENCOUNTER — Telehealth: Payer: Self-pay | Admitting: Internal Medicine

## 2018-11-26 NOTE — Telephone Encounter (Signed)
Copied from CRM [id number]. Topic: Quick Communication - See Telephone Encounter >> Nov 26, 2018  9:59 AM Louie Bun, Rosey Bath D wrote: CRM for notification. See Telephone encounter for: 11/26/18. Patient called and would like to let De. Plotnikov know that she has not been able to sleep in days. She would like to talk to him or his CMA about what options there is for her. Please call patient back, thanks.

## 2018-11-26 NOTE — Telephone Encounter (Signed)
VOV 

## 2018-11-27 ENCOUNTER — Ambulatory Visit (INDEPENDENT_AMBULATORY_CARE_PROVIDER_SITE_OTHER): Payer: 59 | Admitting: Internal Medicine

## 2018-11-27 ENCOUNTER — Encounter: Payer: Self-pay | Admitting: Internal Medicine

## 2018-11-27 DIAGNOSIS — G4700 Insomnia, unspecified: Secondary | ICD-10-CM | POA: Diagnosis not present

## 2018-11-27 MED ORDER — TRAZODONE HCL 50 MG PO TABS: 25.0000 mg | ORAL_TABLET | Freq: Every evening | ORAL | 3 refills | Status: DC | PRN

## 2018-11-27 NOTE — Assessment & Plan Note (Signed)
Chronic, recurrent -relapse. Intolerant of zolpidem-heating in her sleep. Will try trazodone at at bedtime.

## 2018-11-27 NOTE — Telephone Encounter (Signed)
Yes  thx

## 2018-11-27 NOTE — Progress Notes (Signed)
Virtual Visit via Telephone Note  I connected with [name] on 11/27/18 at 11:20 AM EDT by telephone and verified that I am speaking with the correct person using two identifiers.   I discussed the limitations, risks, security and privacy concerns of performing an evaluation and management service by telephone and the availability of in person appointments. I also discussed with the patient that there may be a patient responsible charge related to this service. The patient expressed understanding and agreed to proceed.   History of Present Illness:  Angel [name] is complaining of trouble sleeping of 2 weeks duration.  She has recovered from her cold.  She is been going to bed around 10, waking up by 1 and staying up till 5 unable to fall asleep.  She has had this problem in the past.  She has a history of zolpidem intolerance. Observations/Objective: Albie looks well.  She is in no acute distress  Assessment and Plan:  See plan Follow Up Instructions:    I discussed the assessment and treatment plan with the patient. The patient was provided an opportunity to ask questions and all were answered. The patient agreed with the plan and demonstrated an understanding of the instructions.   The patient was advised to call back or seek an in-person evaluation if the symptoms worsen or if the condition fails to improve as anticipated.  I provided 15 minutes of non-face-to-face time during this encounter.   Sonda Primes, MD

## 2018-11-27 NOTE — Telephone Encounter (Signed)
Virtual scheduled  °

## 2018-12-19 ENCOUNTER — Other Ambulatory Visit: Payer: Self-pay | Admitting: Internal Medicine

## 2019-03-21 ENCOUNTER — Other Ambulatory Visit: Payer: Self-pay | Admitting: Internal Medicine

## 2019-06-08 ENCOUNTER — Other Ambulatory Visit (INDEPENDENT_AMBULATORY_CARE_PROVIDER_SITE_OTHER): Payer: 59

## 2019-06-08 ENCOUNTER — Other Ambulatory Visit: Payer: Self-pay

## 2019-06-08 ENCOUNTER — Encounter: Payer: Self-pay | Admitting: Internal Medicine

## 2019-06-08 ENCOUNTER — Ambulatory Visit (INDEPENDENT_AMBULATORY_CARE_PROVIDER_SITE_OTHER): Payer: 59 | Admitting: Internal Medicine

## 2019-06-08 DIAGNOSIS — R7309 Other abnormal glucose: Secondary | ICD-10-CM | POA: Diagnosis not present

## 2019-06-08 DIAGNOSIS — G43001 Migraine without aura, not intractable, with status migrainosus: Secondary | ICD-10-CM | POA: Diagnosis not present

## 2019-06-08 DIAGNOSIS — Z0000 Encounter for general adult medical examination without abnormal findings: Secondary | ICD-10-CM | POA: Diagnosis not present

## 2019-06-08 DIAGNOSIS — E559 Vitamin D deficiency, unspecified: Secondary | ICD-10-CM

## 2019-06-08 DIAGNOSIS — I272 Pulmonary hypertension, unspecified: Secondary | ICD-10-CM | POA: Diagnosis not present

## 2019-06-08 LAB — BASIC METABOLIC PANEL
BUN: 13 mg/dL (ref 6–23)
CO2: 27 mEq/L (ref 19–32)
Calcium: 9.3 mg/dL (ref 8.4–10.5)
Chloride: 105 mEq/L (ref 96–112)
Creatinine, Ser: 0.92 mg/dL (ref 0.40–1.20)
GFR: 80.57 mL/min (ref >60.00)
Glucose, Bld: 91 mg/dL (ref 70–99)
Potassium: 4 mEq/L (ref 3.5–5.1)
Sodium: 139 mEq/L (ref 135–145)

## 2019-06-08 LAB — CBC WITH DIFFERENTIAL/PLATELET
Basophils Absolute: 0 10*3/uL (ref 0.0–0.1)
Basophils Relative: 0.6 % (ref 0.0–3.0)
Eosinophils Absolute: 0.1 10*3/uL (ref 0.0–0.7)
Eosinophils Relative: 2.7 % (ref 0.0–5.0)
HCT: 40.5 % (ref 36.0–46.0)
Hemoglobin: 13.3 g/dL (ref 12.0–15.0)
Lymphocytes Relative: 29 % (ref 12.0–46.0)
Lymphs Abs: 1.4 10*3/uL (ref 0.7–4.0)
MCHC: 32.7 g/dL (ref 30.0–36.0)
MCV: 92.4 fl (ref 78.0–100.0)
Monocytes Absolute: 0.4 10*3/uL (ref 0.1–1.0)
Monocytes Relative: 8.1 % (ref 3.0–12.0)
Neutro Abs: 2.9 10*3/uL (ref 1.4–7.7)
Neutrophils Relative %: 59.6 % (ref 43.0–77.0)
Platelets: 275 10*3/uL (ref 150.0–400.0)
RBC: 4.38 Mil/uL (ref 3.87–5.11)
RDW: 14.1 % (ref 11.5–15.5)
WBC: 4.9 10*3/uL (ref 4.0–10.5)

## 2019-06-08 LAB — LIPID PANEL
Cholesterol: 181 mg/dL (ref 0–200)
HDL: 66.4 mg/dL (ref >39.00)
LDL Cholesterol: 101 mg/dL — ABNORMAL HIGH (ref 0–99)
NonHDL: 114.71
Total CHOL/HDL Ratio: 3
Triglycerides: 70 mg/dL (ref 0.0–149.0)
VLDL: 14 mg/dL (ref 0.0–40.0)

## 2019-06-08 LAB — URINALYSIS
Bilirubin Urine: NEGATIVE
Hgb urine dipstick: NEGATIVE
Ketones, ur: NEGATIVE
Leukocytes,Ua: NEGATIVE
Nitrite: NEGATIVE
Specific Gravity, Urine: 1.015 (ref 1.000–1.030)
Total Protein, Urine: NEGATIVE
Urine Glucose: NEGATIVE
Urobilinogen, UA: 0.2 (ref 0.0–1.0)
pH: 6 (ref 5.0–8.0)

## 2019-06-08 LAB — HEPATIC FUNCTION PANEL
ALT: 11 U/L (ref 0–35)
AST: 12 U/L (ref 0–37)
Albumin: 4.1 g/dL (ref 3.5–5.2)
Alkaline Phosphatase: 46 U/L (ref 39–117)
Bilirubin, Direct: 0.1 mg/dL (ref 0.0–0.3)
Total Bilirubin: 0.5 mg/dL (ref 0.2–1.2)
Total Protein: 6.9 g/dL (ref 6.0–8.3)

## 2019-06-08 LAB — TSH: TSH: 2.8 u[IU]/mL (ref 0.35–4.50)

## 2019-06-08 NOTE — Assessment & Plan Note (Signed)
A1c

## 2019-06-08 NOTE — Patient Instructions (Signed)

## 2019-06-08 NOTE — Assessment & Plan Note (Signed)
chronic

## 2019-06-08 NOTE — Assessment & Plan Note (Signed)
Vit D 

## 2019-06-08 NOTE — Assessment & Plan Note (Signed)
F/u w/Card Dr Claudie Leach in [facility]

## 2019-06-08 NOTE — Progress Notes (Signed)
Subjective:  Patient ID: Angel [name], female    DOB: 12/15/1975  Age: 43 y.o. MRN: [id number]  CC: No chief complaint on file.   HPI OLIA HINDERLITER presents for a f/u Pt had an E[facility] in [place] (Dr Vira Browns), saw a cardiologist - ?pulm HTN (RV pressure 44 mm Hg), (+) D dimer, CRI, prediabetes (May, August 2020) and shingles in Sept 2020  Outpatient Medications Prior to Visit  Medication Sig Dispense Refill  . acetaminophen (TYLENOL) 500 MG tablet Take 500 mg by mouth every 6 (six) hours as needed.    Marland Kitchen albuterol (PROVENTIL HFA;VENTOLIN HFA) 108 (90 Base) MCG/ACT inhaler Inhale 2 puffs into the lungs every 6 (six) hours as needed for wheezing or shortness of breath. 1 Inhaler 1  . Cholecalciferol (VITAMIN D3) 2000 units capsule Take 1 capsule (2,000 Units total) by mouth daily. 100 capsule 3  . clindamycin (CLINDAGEL) 1 % gel Apply 1 application topically daily.   11  . hydrocortisone (ANUSOL-HC) 2.5 % rectal cream Use bid 30 g 1  . hydrocortisone (ANUSOL-HC) 25 MG suppository Place 1 suppository (25 mg total) rectally 2 (two) times daily as needed for hemorrhoids or itching. 20 suppository 0  . ibuprofen (ADVIL,MOTRIN) 800 MG tablet Take 1 tablet (800 mg total) by mouth every 8 (eight) hours as needed. 30 tablet 0  . spironolactone (ALDACTONE) 50 MG tablet     . SUMAtriptan (IMITREX) 100 MG tablet Take 1 tablet (100 mg total) by mouth every 2 (two) hours as needed for migraine. May repeat in 2 hours if headache persists or recurs. 12 tablet 3  . traZODone (DESYREL) 50 MG tablet TAKE 1/2-1 TABLETS (25-50 MG TOTAL) BY MOUTH AT BEDTIME AS NEEDED FOR SLEEP (INSOMNIA). 90 tablet 1  . triamcinolone cream (KENALOG) 0.1 % triamcinolone acetonide 0.1 % topical cream     No facility-administered medications prior to visit.     ROS: Review of Systems  Constitutional: Positive for fatigue. Negative for activity change, appetite change, chills and unexpected weight change.   HENT: Negative for congestion, mouth sores and sinus pressure.   Eyes: Negative for visual disturbance.  Respiratory: Positive for shortness of breath. Negative for cough and chest tightness.   Gastrointestinal: Negative for abdominal pain and nausea.  Genitourinary: Negative for difficulty urinating, frequency and vaginal pain.  Musculoskeletal: Negative for back pain and gait problem.  Skin: Negative for pallor and rash.  Neurological: Negative for dizziness, tremors, weakness, numbness and headaches.  Psychiatric/Behavioral: Negative for confusion, sleep disturbance and suicidal ideas.    Objective:  BP 110/62 (BP Location: Left Arm, Patient Position: Sitting, Cuff Size: Large)   Pulse 66   Temp 98.8 F (37.1 C) (Oral)   Ht 5\' 6"  (1.676 m)   Wt 198 lb (89.8 kg)   SpO2 99%   BMI 31.96 kg/m   BP Readings from Last 3 Encounters:  06/08/19 110/62  10/30/18 (!) 145/92  10/29/18 116/78    Wt Readings from Last 3 Encounters:  06/08/19 198 lb (89.8 kg)  10/30/18 199 lb 15.3 oz (90.7 kg)  10/29/18 200 lb (90.7 kg)    Physical Exam Constitutional:      General: She is not in acute distress.    Appearance: She is well-developed. She is obese.  HENT:     Head: Normocephalic.     Right Ear: External ear normal.     Left Ear: External ear normal.     Nose: Nose normal.  Eyes:     General:        Right eye: No discharge.        Left eye: No discharge.     Conjunctiva/sclera: Conjunctivae normal.     Pupils: Pupils are equal, round, and reactive to light.  Neck:     Musculoskeletal: Normal range of motion and neck supple.     Thyroid: No thyromegaly.     Vascular: No JVD.     Trachea: No tracheal deviation.  Cardiovascular:     Rate and Rhythm: Normal rate and regular rhythm.     Heart sounds: Normal heart sounds.  Pulmonary:     Effort: No respiratory distress.     Breath sounds: No stridor. No wheezing.  Abdominal:     General: Bowel sounds are normal. There is no  distension.     Palpations: Abdomen is soft. There is no mass.     Tenderness: There is no abdominal tenderness. There is no guarding or rebound.  Musculoskeletal:        General: No tenderness.  Lymphadenopathy:     Cervical: No cervical adenopathy.  Skin:    Findings: No erythema or rash.  Neurological:     Cranial Nerves: No cranial nerve deficit.     Motor: No abnormal muscle tone.     Coordination: Coordination normal.     Deep Tendon Reflexes: Reflexes normal.  Psychiatric:        Behavior: Behavior normal.        Thought Content: Thought content normal.        Judgment: Judgment normal.     Lab Results  Component Value Date   WBC 5.1 05/03/2017   HGB 13.0 05/03/2017   HCT 39.7 05/03/2017   PLT 227.0 05/03/2017   GLUCOSE 94 11/01/2017   CHOL 179 05/03/2017   TRIG 45.0 05/03/2017   HDL 74.50 05/03/2017   LDLCALC 96 05/03/2017   ALT 13 11/01/2017   AST 13 11/01/2017   NA 140 11/01/2017   K 4.3 11/01/2017   CL 109 11/01/2017   CREATININE 0.85 11/01/2017   BUN 15 11/01/2017   CO2 25 11/01/2017   TSH 1.94 05/03/2017   HGBA1C 5.6 10/18/2014    No results found.  Assessment & Plan:   There are no diagnoses linked to this encounter.   No orders of the defined types were placed in this encounter.    Follow-up: No follow-ups on file.  Walker Kehr, MD

## 2019-06-10 ENCOUNTER — Ambulatory Visit (INDEPENDENT_AMBULATORY_CARE_PROVIDER_SITE_OTHER): Payer: 59 | Admitting: Cardiology

## 2019-06-10 ENCOUNTER — Other Ambulatory Visit: Payer: Self-pay

## 2019-06-10 ENCOUNTER — Encounter: Payer: Self-pay | Admitting: Cardiology

## 2019-06-10 VITALS — BP 104/62 | HR 71 | Ht 66.0 in | Wt 198.1 lb

## 2019-06-10 DIAGNOSIS — R9431 Abnormal electrocardiogram [ECG] [EKG]: Secondary | ICD-10-CM | POA: Diagnosis not present

## 2019-06-10 DIAGNOSIS — R0600 Dyspnea, unspecified: Secondary | ICD-10-CM

## 2019-06-10 DIAGNOSIS — E663 Overweight: Secondary | ICD-10-CM | POA: Insufficient documentation

## 2019-06-10 DIAGNOSIS — D573 Sickle-cell trait: Secondary | ICD-10-CM

## 2019-06-10 DIAGNOSIS — R0609 Other forms of dyspnea: Secondary | ICD-10-CM

## 2019-06-10 HISTORY — DX: Other forms of dyspnea: R06.09

## 2019-06-10 HISTORY — DX: Dyspnea, unspecified: R06.00

## 2019-06-10 NOTE — Progress Notes (Signed)
Cardiology Office Note:    Date:  06/10/2019   ID:  Angel [name], DOB [birth year], MRN [id number]  PCP:  Tresa Garter, MD  Cardiologist:  Garwin Brothers, MD   Referring MD: Tresa Garter, MD    ASSESSMENT:    1. Sickle-cell trait (HCC)   2. DOE (dyspnea on exertion)    PLAN:    In order of problems listed above:  1. I discussed my findings with the patient at extensive length.  Primary prevention stressed to the patient.  Importance of compliance with diet and medication stressed and she vocalized understanding.  Her blood pressure stable.  Diet was discussed for obesity and weight reduction was stressed. 2. Echocardiogram was reviewed and will get a copy from his primary care physician. 3. Patient will undergo Lexiscan sestamibi to assess dyspnea on exertion.  I recommended calcium scoring because of family history of coronary artery disease and she is agreeable.  I will also do a D-dimer in view of dyspnea on exertion. 4. Patient will be seen in follow-up appointment in 3 months or earlier if the patient has any concerns    Medication Adjustments/Labs and Tests Ordered: Current medicines are reviewed at length with the patient today.  Concerns regarding medicines are outlined above.  No orders of the defined types were placed in this encounter.  No orders of the defined types were placed in this encounter.    History of Present Illness:    Angel [name] is a 43 y.o. female who is being seen today for the evaluation of dyspnea on exertion at the request of Plotnikov, Georgina Quint, MD.  [name]  She had an echocardiogram recently and she pulled it up on the phone for me.  It was largely unremarkable.  Pulmonary artery pressure was mentioned to be at 40 mmHg approximately.  She has some history of dyspnea on exertion but she leads a sedentary lifestyle.  She is overweight.  She denies any history of hypertension diabetes  mellitus or dyslipidemia.  At the time of my evaluation, the patient is alert awake oriented and in no distress.  Past Medical History:  Diagnosis Date   Breast mass 06/2013   bilateral   GERD (gastroesophageal reflux disease) 08/2014   takes Protonix daily   Herniated disc    Migraine headache    Sickle cell trait (HCC)    Vitamin D deficiency 2009    Past Surgical History:  Procedure Laterality Date   BILATERAL SALPINGECTOMY  09/26/2015   Procedure: BILATERAL SALPINGECTOMY;  Surgeon: Osborn Coho, MD;  Location: [facility];  Service: Gynecology;;   BREAST BIOPSY Bilateral 07/13/2013   Procedure: BILATERAL EXCISION BREAS MASSES;  Surgeon: Shelly Rubenstein, MD;  Location: [facility];  Service: General;  Laterality: Bilateral;   BREAST CYST EXCISION Left 2014   4 cysts removed   DE QUERVAIN'S RELEASE Right 09/06/2003   DILITATION & CURRETTAGE/HYSTROSCOPY WITH NOVASURE ABLATION N/A 09/26/2015   Procedure: DILATATION & CURETTAGE/HYSTEROSCOPY WITH NOVASURE ABLATION;  Surgeon: Osborn Coho, MD;  Location: [facility];  Service: Gynecology;  Laterality: N/A;   LAPAROSCOPIC OVARIAN CYSTECTOMY Right 09/26/2015   Procedure: LAPAROSCOPIC Right OVARIAN CYSTECTOMY, Bilateral Peritoneal Biopsies over Uteralsacral area;  Surgeon: Osborn Coho, MD;  Location: [facility];  Service: Gynecology;  Laterality: Right;   SPINAL FUSION  07/05/2018   TUBAL LIGATION  12/27/2005   WISDOM TOOTH EXTRACTION     WRIST GANGLION EXCISION Right 09/06/2003  WRIST GANGLION EXCISION Left 09/04/1999    Current Medications: Current Meds  Medication Sig   acetaminophen (TYLENOL) 500 MG tablet Take 500 mg by mouth every 6 (six) hours as needed.   albuterol (PROVENTIL HFA;VENTOLIN HFA) 108 (90 Base) MCG/ACT inhaler Inhale 2 puffs into the lungs every 6 (six) hours as needed for wheezing or shortness of breath.   Cholecalciferol (VITAMIN D3) 2000 units capsule Take 1 capsule (2,000 Units total) by  mouth daily.   clindamycin (CLINDAGEL) 1 % gel Apply 1 application topically daily.    hydrocortisone (ANUSOL-HC) 2.5 % rectal cream Use bid   hydrocortisone (ANUSOL-HC) 25 MG suppository Place 1 suppository (25 mg total) rectally 2 (two) times daily as needed for hemorrhoids or itching.   ibuprofen (ADVIL,MOTRIN) 800 MG tablet Take 1 tablet (800 mg total) by mouth every 8 (eight) hours as needed.   spironolactone (ALDACTONE) 50 MG tablet    SUMAtriptan (IMITREX) 100 MG tablet Take 1 tablet (100 mg total) by mouth every 2 (two) hours as needed for migraine. May repeat in 2 hours if headache persists or recurs.   traZODone (DESYREL) 50 MG tablet TAKE 1/2-1 TABLETS (25-50 MG TOTAL) BY MOUTH AT BEDTIME AS NEEDED FOR SLEEP (INSOMNIA).   triamcinolone cream (KENALOG) 0.1 % triamcinolone acetonide 0.1 % topical cream     Allergies:   Zolpidem   Social History   Socioeconomic History   Marital status: Married    Spouse name: Not on file   Number of children: 2   Years of education: Not on file   Highest education level: Not on file  Occupational History   Not on file  Social Needs   Financial resource strain: Not on file   Food insecurity    Worry: Not on file    Inability: Not on file   Transportation needs    Medical: Not on file    Non-medical: Not on file  Tobacco Use   Smoking status: Never Smoker   Smokeless tobacco: Never Used  Substance and Sexual Activity   Alcohol use: No   Drug use: No   Sexual activity: Not on file    Comment: BTL  Lifestyle   Physical activity    Days per week: Not on file    Minutes per session: Not on file   Stress: Not on file  Relationships   Social connections    Talks on phone: Not on file    Gets together: Not on file    Attends religious service: Not on file    Active member of club or organization: Not on file    Attends meetings of clubs or organizations: Not on file    Relationship status: Not on file  Other  Topics Concern   Not on file  Social History Narrative   Not on file     Family History: The patient's family history includes Cancer in her father; Diabetes in her father; Heart disease in her father; Hypertension in her father; Sickle cell anemia in her daughter and son.  ROS:   Please see the history of present illness.    All other systems reviewed and are negative.  EKGs/Labs/Other Studies Reviewed:    The following studies were reviewed today: EKG reveals sinus rhythm and nonspecific ST-T changes.   Recent Labs: 06/08/2019: ALT 11; BUN 13; Creatinine, Ser 0.92; Hemoglobin 13.3; Platelets 275.0; Potassium 4.0; Sodium 139; TSH 2.80  Recent Lipid Panel    Component Value Date/Time   CHOL 181 06/08/2019  6213   TRIG 70.0 06/08/2019 0928   HDL 66.40 06/08/2019 0928   CHOLHDL 3 06/08/2019 0928   VLDL 14.0 06/08/2019 0928   LDLCALC 101 (H) 06/08/2019 0928    Physical Exam:    VS:  BP 104/62 (BP Location: Left Arm, Patient Position: Sitting, Cuff Size: Normal)    Pulse 71    Ht 5\' 6"  (1.676 m)    Wt 198 lb 1.9 oz (89.9 kg)    SpO2 100%    BMI 31.98 kg/m     Wt Readings from Last 3 Encounters:  06/10/19 198 lb 1.9 oz (89.9 kg)  06/08/19 198 lb (89.8 kg)  10/30/18 199 lb 15.3 oz (90.7 kg)     GEN: Patient is in no acute distress HEENT: Normal NECK: No JVD; No carotid bruits LYMPHATICS: No lymphadenopathy CARDIAC: S1 S2 regular, 2/6 systolic murmur at the apex. RESPIRATORY:  Clear to auscultation without rales, wheezing or rhonchi  ABDOMEN: Soft, non-tender, non-distended MUSCULOSKELETAL:  No edema; No deformity  SKIN: Warm and dry NEUROLOGIC:  Alert and oriented x 3 PSYCHIATRIC:  Normal affect    Signed, Jenean Lindau, MD  06/10/2019 2:27 PM    [place]

## 2019-06-10 NOTE — Patient Instructions (Signed)
Medication Instructions:  Your physician recommends that you continue on your current medications as directed. Please refer to the Current Medication list given to you today.  *If you need a refill on your cardiac medications before your next appointment, please call your pharmacy*  Lab Work: NONE If you have labs (blood work) drawn today and your tests are completely normal, you will receive your results only by: Marland Kitchen MyChart Message (if you have MyChart) OR . A paper copy in the mail If you have any lab test that is abnormal or we need to change your treatment, we will call you to review the results.  Testing/Procedures: You had an EKG performed today.   Your physician has requested that you have a lexiscan myoview. For further information please visit [email or web address] Please follow instruction sheet, as given.  You have been scheduled to have a CT calcium score performed at [address], [place], [place] on June 18, 2019 at 2 pm. THERE IS A $150 fee due upon arrival    Follow-Up: At [facility], you and your health needs are our priority.  As part of our continuing mission to provide you with exceptional heart care, we have created designated Provider Care Teams.  These Care Teams include your primary Cardiologist (physician) and Advanced Practice Providers (APPs -  Physician Assistants and Nurse Practitioners) who all work together to provide you with the care you need, when you need it.  Your next appointment:   3 months  The format for your next appointment:   In Person  Provider:   Belva Crome, MD  Other Instructions Regadenoson injection What is this medicine? REGADENOSON is used to test the heart for coronary artery disease. It is used in patients who can not exercise for their stress test. This medicine may be used for other purposes; ask your health care provider or pharmacist if you have questions. COMMON BRAND NAME(S): Lexiscan What should I tell my  health care provider before I take this medicine? They need to know if you have any of these conditions:  heart problems  lung or breathing disease, like asthma or COPD  an unusual or allergic reaction to regadenoson, other medicines, foods, dyes, or preservatives  pregnant or trying to get pregnant  breast-feeding How should I use this medicine? This medicine is for injection into a vein. It is given by a health care professional in a hospital or clinic setting. Talk to your pediatrician regarding the use of this medicine in children. Special care may be needed. Overdosage: If you think you have taken too much of this medicine contact a poison control center or emergency room at once. NOTE: This medicine is only for you. Do not share this medicine with others. What if I miss a dose? This does not apply. What may interact with this medicine?  caffeine  dipyridamole  guarana  theophylline This list may not describe all possible interactions. Give your health care provider a list of all the medicines, herbs, non-prescription drugs, or dietary supplements you use. Also tell them if you smoke, drink alcohol, or use illegal drugs. Some items may interact with your medicine. What should I watch for while using this medicine? Your condition will be monitored carefully while you are receiving this medicine. Do not take medicines, foods, or drinks with caffeine (like coffee, tea, or colas) for at least 12 hours before your test. If you do not know if something contains caffeine, ask your health care professional. What side effects  may I notice from receiving this medicine? Side effects that you should report to your doctor or health care professional as soon as possible:  allergic reactions like skin rash, itching or hives, swelling of the face, lips, or tongue  breathing problems  chest pain, tightness or palpitations  severe headache Side effects that usually do not require medical  attention (report to your doctor or health care professional if they continue or are bothersome):  flushing  headache  irritation or pain at site where injected  nausea, vomiting This list may not describe all possible side effects. Call your doctor for medical advice about side effects. You may report side effects to FDA at 1-800-FDA-1088. Where should I keep my medicine? This drug is given in a hospital or clinic and will not be stored at home. NOTE: This sheet is a summary. It may not cover all possible information. If you have questions about this medicine, talk to your doctor, pharmacist, or health care provider.  2020 Elsevier/Gold Standard (2008-03-29 15:08:13)   Coronary Calcium Scan A coronary calcium scan is an imaging test used to look for deposits of calcium and other fatty materials (plaques) in the inner lining of the blood vessels of the heart (coronary arteries). These deposits of calcium and plaques can partly clog and narrow the coronary arteries without producing any symptoms or warning signs. This puts a person at risk for a heart attack. This test can detect these deposits before symptoms develop. Tell a health care provider about:  Any allergies you have.  All medicines you are taking, including vitamins, herbs, eye drops, creams, and over-the-counter medicines.  Any problems you or family members have had with anesthetic medicines.  Any blood disorders you have.  Any surgeries you have had.  Any medical conditions you have.  Whether you are pregnant or may be pregnant. What are the risks? Generally, this is a safe procedure. However, problems may occur, including:  Harm to a pregnant woman and her unborn baby. This test involves the use of radiation. Radiation exposure can be dangerous to a pregnant woman and her unborn baby. If you are pregnant, you generally should not have this procedure done.  Slight increase in the risk of cancer. This is because of  the radiation involved in the test. What happens before the procedure? No preparation is needed for this procedure. What happens during the procedure?   You will undress and remove any jewelry around your neck or chest.  You will put on a hospital gown.  Sticky electrodes will be placed on your chest. The electrodes will be connected to an electrocardiogram (ECG) machine to record a tracing of the electrical activity of your heart.  A CT scanner will take pictures of your heart. During this time, you will be asked to lie still and hold your breath for 2-3 seconds while a picture of your heart is being taken. The procedure may vary among health care providers and hospitals. What happens after the procedure?  You can get dressed.  You can return to your normal activities.  It is up to you to get the results of your test. Ask your health care provider, or the department that is doing the test, when your results will be ready. Summary  A coronary calcium scan is an imaging test used to look for deposits of calcium and other fatty materials (plaques) in the inner lining of the blood vessels of the heart (coronary arteries).  Generally, this is a safe  procedure. Tell your health care provider if you are pregnant or may be pregnant.  No preparation is needed for this procedure.  A CT scanner will take pictures of your heart.  You can return to your normal activities after the scan is done. This information is not intended to replace advice given to you by your health care provider. Make sure you discuss any questions you have with your health care provider. Document Released: 01/26/2008 Document Revised: 07/12/2017 Document Reviewed: 06/18/2016 Elsevier Patient Education  2020 [employer].

## 2019-06-11 ENCOUNTER — Telehealth: Payer: Self-pay

## 2019-06-11 LAB — D-DIMER, QUANTITATIVE: D-DIMER: <0.2 mg/L FEU (ref 0.00–0.49)

## 2019-06-11 NOTE — Telephone Encounter (Signed)
-----   Message from Jenean Lindau, MD sent at 06/11/2019  8:41 AM EDT ----- The results of the study is unremarkable. Please inform patient. I will discuss in detail at next appointment. Cc  primary care/referring physician Jenean Lindau, MD 06/11/2019 8:40 AM

## 2019-06-11 NOTE — Telephone Encounter (Signed)
Results relayed, copy sent to Dr. Alain Marion

## 2019-06-15 ENCOUNTER — Ambulatory Visit: Payer: 59 | Admitting: Internal Medicine

## 2019-06-15 ENCOUNTER — Telehealth (HOSPITAL_COMMUNITY): Payer: Self-pay | Admitting: *Deleted

## 2019-06-15 NOTE — Telephone Encounter (Signed)
Patient given detailed instructions per Myocardial Perfusion Study Information Sheet for the test on 06/19/19. Patient notified to arrive 15 minutes early and that it is imperative to arrive on time for appointment to keep from having the test rescheduled.  If you need to cancel or reschedule your appointment, please call the office within 24 hours of your appointment. . Patient verbalized understanding. Romell Cavanah Jacqueline    

## 2019-06-18 ENCOUNTER — Other Ambulatory Visit: Payer: Self-pay

## 2019-06-18 ENCOUNTER — Ambulatory Visit (INDEPENDENT_AMBULATORY_CARE_PROVIDER_SITE_OTHER)
Admission: RE | Admit: 2019-06-18 | Discharge: 2019-06-18 | Disposition: A | Discharge status: Home / Self Care | Payer: Self-pay | Source: Ambulatory Visit | Attending: Cardiology | Admitting: Cardiology

## 2019-06-18 DIAGNOSIS — R0600 Dyspnea, unspecified: Secondary | ICD-10-CM

## 2019-06-18 DIAGNOSIS — R9431 Abnormal electrocardiogram [ECG] [EKG]: Secondary | ICD-10-CM

## 2019-06-19 ENCOUNTER — Encounter (HOSPITAL_COMMUNITY): Payer: 59

## 2019-06-24 ENCOUNTER — Encounter (HOSPITAL_COMMUNITY): Payer: 59

## 2019-06-25 ENCOUNTER — Telehealth: Payer: Self-pay

## 2019-06-25 NOTE — Telephone Encounter (Signed)
-----   Message from Jenean Lindau, MD sent at 06/18/2019  3:10 PM EST ----- Awaiting remainder of the report.  The results of the study is unremarkable. Please inform patient. I will discuss in detail at next appointment. Cc  primary care/referring physician Jenean Lindau, MD 06/18/2019 3:10 PM

## 2019-06-25 NOTE — Telephone Encounter (Signed)
Calcium score results relayed. Patient states she received a denial letter for lexi scan. RN advised that we have not seen anything but will have provider do a one-to-one if needed for procedure. Will be in touch with updates. Copy of results sent to Dr. Alain Marion.

## 2019-07-06 ENCOUNTER — Encounter (HOSPITAL_BASED_OUTPATIENT_CLINIC_OR_DEPARTMENT_OTHER): Payer: Self-pay

## 2019-07-06 ENCOUNTER — Other Ambulatory Visit: Payer: Self-pay

## 2019-07-06 ENCOUNTER — Emergency Department (HOSPITAL_BASED_OUTPATIENT_CLINIC_OR_DEPARTMENT_OTHER): Payer: 59

## 2019-07-06 ENCOUNTER — Emergency Department (HOSPITAL_BASED_OUTPATIENT_CLINIC_OR_DEPARTMENT_OTHER)
Admission: EM | Admit: 2019-07-06 | Discharge: 2019-07-06 | Disposition: A | Discharge status: Home / Self Care | Payer: 59 | Attending: Emergency Medicine | Admitting: Emergency Medicine

## 2019-07-06 DIAGNOSIS — R002 Palpitations: Secondary | ICD-10-CM | POA: Insufficient documentation

## 2019-07-06 DIAGNOSIS — R079 Chest pain, unspecified: Secondary | ICD-10-CM | POA: Insufficient documentation

## 2019-07-06 DIAGNOSIS — R0789 Other chest pain: Secondary | ICD-10-CM

## 2019-07-06 DIAGNOSIS — Z79899 Other long term (current) drug therapy: Secondary | ICD-10-CM | POA: Diagnosis not present

## 2019-07-06 LAB — TROPONIN I (HIGH SENSITIVITY)
Troponin I (High Sensitivity): <2 ng/L (ref <18)
Troponin I (High Sensitivity): <2 ng/L (ref <18)

## 2019-07-06 LAB — LIPASE, BLOOD: Lipase: 28 U/L (ref 11–51)

## 2019-07-06 LAB — CBC
HCT: 40.6 % (ref 36.0–46.0)
Hemoglobin: 13.4 g/dL (ref 12.0–15.0)
MCH: 30 pg (ref 26.0–34.0)
MCHC: 33 g/dL (ref 30.0–36.0)
MCV: 91 fL (ref 80.0–100.0)
Platelets: 279 10*3/uL (ref 150–400)
RBC: 4.46 MIL/uL (ref 3.87–5.11)
RDW: 13.4 % (ref 11.5–15.5)
WBC: 5.6 10*3/uL (ref 4.0–10.5)
nRBC: 0 % (ref 0.0–0.2)

## 2019-07-06 LAB — HEPATIC FUNCTION PANEL
ALT: 15 U/L (ref 0–44)
AST: 16 U/L (ref 15–41)
Albumin: 4.2 g/dL (ref 3.5–5.0)
Alkaline Phosphatase: 49 U/L (ref 38–126)
Bilirubin, Direct: 0.1 mg/dL (ref 0.0–0.2)
Indirect Bilirubin: 0.3 mg/dL (ref 0.3–0.9)
Total Bilirubin: 0.4 mg/dL (ref 0.3–1.2)
Total Protein: 6.8 g/dL (ref 6.5–8.1)

## 2019-07-06 LAB — BASIC METABOLIC PANEL
Anion gap: 8 (ref 5–15)
BUN: 17 mg/dL (ref 6–20)
CO2: 23 mmol/L (ref 22–32)
Calcium: 9 mg/dL (ref 8.9–10.3)
Chloride: 106 mmol/L (ref 98–111)
Creatinine, Ser: 0.92 mg/dL (ref 0.44–1.00)
GFR calc Af Amer: >60 mL/min (ref >60)
GFR calc non Af Amer: >60 mL/min (ref >60)
Glucose, Bld: 94 mg/dL (ref 70–99)
Potassium: 3.8 mmol/L (ref 3.5–5.1)
Sodium: 137 mmol/L (ref 135–145)

## 2019-07-06 LAB — MAGNESIUM: Magnesium: 2.1 mg/dL (ref 1.7–2.4)

## 2019-07-06 MED ORDER — CYCLOBENZAPRINE HCL 10 MG PO TABS: 10.0000 mg | ORAL_TABLET | Freq: Two times a day (BID) | ORAL | 0 refills | Status: DC | PRN

## 2019-07-06 NOTE — ED Provider Notes (Signed)
[facility] EMERGENCY DEPARTMENT Provider Note   CSN: [id number] Arrival date & time: 07/06/19  1253     History   Chief Complaint Chief Complaint  Patient presents with   Chest Pain    HPI Angel [name] is a 43 y.o. female.     The history is provided by the patient and medical records. No language interpreter was used.  Chest Pain Pain location:  L chest Pain quality: sharp and stabbing   Pain radiates to:  Does not radiate Pain severity:  Severe Onset quality:  Sudden Duration: seconds. Timing:  Sporadic Progression:  Resolved Chronicity:  New Context: breathing   Relieved by:  Nothing Worsened by:  Deep breathing Ineffective treatments:  None tried Associated symptoms: palpitations   Associated symptoms: no abdominal pain, no altered mental status, no back pain, no claudication, no cough, no diaphoresis, no dizziness, no fatigue, no fever, no lower extremity edema, no nausea, no numbness, no shortness of breath, no vomiting and no weakness   Risk factors: no birth control, not female and no prior DVT/PE     Past Medical History:  Diagnosis Date   Breast mass 06/2013   bilateral   GERD (gastroesophageal reflux disease) 08/2014   takes Protonix daily   Herniated disc    Migraine headache    Sickle cell trait (HCC)    Vitamin D deficiency 2009    Patient Active Problem List   Diagnosis Date Noted   DOE (dyspnea on exertion) 06/10/2019   Overweight 06/10/2019   Pulmonary hypertension (HCC) 06/08/2019   Influenza A 10/29/2018   Kidney cysts 05/07/2018   Cough 04/03/2018   Wheezing 04/03/2018   URI (upper respiratory infection) 03/16/2018   Acne 11/01/2017   Hypermobility syndrome 06/20/2017   CAP (community acquired pneumonia) 06/04/2017   Fatigue 06/04/2017   Neck pain 04/16/2017   Obesity (BMI 30-39.9) 04/05/2017   Impacted cerumen of right ear 01/19/2016   GERD (gastroesophageal reflux disease) 06/02/2015    Elevated glucose 10/18/2014   Boil of trunk 06/08/2014   Pain, abdominal, RLQ 10/16/2013   Insomnia 10/07/2013   Mass of multiple sites of left breast 06/08/2013   Breast mass, right 06/08/2013   Right lumbar radiculopathy 04/22/2013   Low back pain 06/27/2012   Bloating symptom 04/30/2012   Migraine headache    Left foot pain 09/19/2011   Situational mixed anxiety and depressive disorder 06/18/2011   Menorrhagia 06/18/2011   Well adult exam 12/20/2010   FREQUENCY, URINARY 02/16/2010   PARESTHESIA 11/01/2008   Chest pain, unspecified 03/26/2008   WEIGHT GAIN 12/10/2007   Vitamin D deficiency 09/03/2007   INSOMNIA 05/31/2007   Sickle-cell trait (HCC) 05/20/2007   Migraine without aura 05/20/2007   ALLERGIC RHINITIS 05/20/2007    Past Surgical History:  Procedure Laterality Date   BILATERAL SALPINGECTOMY  09/26/2015   Procedure: BILATERAL SALPINGECTOMY;  Surgeon: Osborn Coho, MD;  Location: [facility];  Service: Gynecology;;   BREAST BIOPSY Bilateral 07/13/2013   Procedure: BILATERAL EXCISION BREAS MASSES;  Surgeon: Shelly Rubenstein, MD;  Location: [facility];  Service: General;  Laterality: Bilateral;   BREAST CYST EXCISION Left 2014   4 cysts removed   DE QUERVAIN'S RELEASE Right 09/06/2003   DILITATION & CURRETTAGE/HYSTROSCOPY WITH NOVASURE ABLATION N/A 09/26/2015   Procedure: DILATATION & CURETTAGE/HYSTEROSCOPY WITH NOVASURE ABLATION;  Surgeon: Osborn Coho, MD;  Location: [facility];  Service: Gynecology;  Laterality: N/A;   LAPAROSCOPIC OVARIAN CYSTECTOMY Right 09/26/2015   Procedure: LAPAROSCOPIC Right  OVARIAN CYSTECTOMY, Bilateral Peritoneal Biopsies over Uteralsacral area;  Surgeon: Osborn Coho, MD;  Location: [facility];  Service: Gynecology;  Laterality: Right;   SPINAL FUSION  07/05/2018   TUBAL LIGATION  12/27/2005   WISDOM TOOTH EXTRACTION     WRIST GANGLION EXCISION Right 09/06/2003   WRIST GANGLION EXCISION Left 09/04/1999       OB History    Gravida  2   Para  2   Term  2   Preterm      AB      Living  2     SAB      TAB      Ectopic      Multiple      Live Births               Home Medications    Prior to Admission medications   Medication Sig Start Date End Date Taking? Authorizing Provider  acetaminophen (TYLENOL) 500 MG tablet Take 500 mg by mouth every 6 (six) hours as needed.    [provider]  albuterol (PROVENTIL HFA;VENTOLIN HFA) 108 (90 Base) MCG/ACT inhaler Inhale 2 puffs into the lungs every 6 (six) hours as needed for wheezing or shortness of breath. 04/03/18   Corwin Levins, MD  Cholecalciferol (VITAMIN D3) 2000 units capsule Take 1 capsule (2,000 Units total) by mouth daily. 05/03/17   Plotnikov, Georgina Quint, MD  clindamycin (CLINDAGEL) 1 % gel Apply 1 application topically daily.  07/21/14   [provider]  hydrocortisone (ANUSOL-HC) 2.5 % rectal cream Use bid 10/29/18   Plotnikov, Georgina Quint, MD  hydrocortisone (ANUSOL-HC) 25 MG suppository Place 1 suppository (25 mg total) rectally 2 (two) times daily as needed for hemorrhoids or itching. 11/26/16   Nche, Bonna Gains, NP  ibuprofen (ADVIL,MOTRIN) 800 MG tablet Take 1 tablet (800 mg total) by mouth every 8 (eight) hours as needed. 11/26/16   Nche, Bonna Gains, NP  spironolactone (ALDACTONE) 50 MG tablet  05/06/18   [provider]  SUMAtriptan (IMITREX) 100 MG tablet Take 1 tablet (100 mg total) by mouth every 2 (two) hours as needed for migraine. May repeat in 2 hours if headache persists or recurs. 05/05/18   Plotnikov, Georgina Quint, MD  traZODone (DESYREL) 50 MG tablet TAKE 1/2-1 TABLETS (25-50 MG TOTAL) BY MOUTH AT BEDTIME AS NEEDED FOR SLEEP (INSOMNIA). 03/21/19   Plotnikov, Georgina Quint, MD  tretinoin (RETIN-A) 0.025 % cream Apply topically.    [provider]  triamcinolone cream (KENALOG) 0.1 % triamcinolone acetonide 0.1 % topical cream    [provider]    Family  History Family History  Problem Relation Age of Onset   Hypertension Father    Diabetes Father    Heart disease Father    Cancer Father        prostate   Sickle cell anemia Daughter    Sickle cell anemia Son     Social History Social History   Tobacco Use   Smoking status: Never Smoker   Smokeless tobacco: Never Used  Substance Use Topics   Alcohol use: No   Drug use: No     Allergies   Zolpidem   Review of Systems Review of Systems  Constitutional: Negative for chills, diaphoresis, fatigue and fever.  HENT: Negative for congestion.   Respiratory: Negative for cough, chest tightness, shortness of breath, wheezing and stridor.   Cardiovascular: Positive for chest pain and palpitations. Negative for claudication and leg swelling.  Gastrointestinal: Negative  for abdominal pain, constipation, diarrhea, nausea and vomiting.  Genitourinary: Negative for dysuria.  Musculoskeletal: Negative for back pain, neck pain and neck stiffness.  Skin: Negative for rash.  Neurological: Positive for light-headedness (resolved). Negative for dizziness, facial asymmetry, weakness and numbness.  Psychiatric/Behavioral: Negative for agitation and confusion.  All other systems reviewed and are negative.    Physical Exam Updated Vital Signs BP 116/68 (BP Location: Right Arm)    Pulse 64    Temp 98.5 F (36.9 C) (Oral)    Resp 18    Ht  (1.676 m)    Wt 88.5 kg    SpO2 100%    BMI 31.47 kg/m   Physical Exam Vitals signs and nursing note reviewed.  Constitutional:      General: She is not in acute distress.    Appearance: She is well-developed and normal weight. She is not ill-appearing, toxic-appearing or diaphoretic.  HENT:     Head: Normocephalic and atraumatic.     Right Ear: External ear normal.     Left Ear: External ear normal.     Nose: Nose normal.     Mouth/Throat:     Pharynx: No oropharyngeal exudate.  Eyes:     Conjunctiva/sclera: Conjunctivae normal.      Pupils: Pupils are equal, round, and reactive to light.  Neck:     Musculoskeletal: Normal range of motion and neck supple.  Cardiovascular:     Rate and Rhythm: Normal rate and regular rhythm.     Heart sounds: Normal heart sounds. No murmur.  Pulmonary:     Effort: Pulmonary effort is normal. No tachypnea or respiratory distress.     Breath sounds: Normal breath sounds. No stridor. No decreased breath sounds, wheezing, rhonchi or rales.  Abdominal:     General: There is no distension.     Palpations: Abdomen is soft.     Tenderness: There is no abdominal tenderness. There is no rebound.  Musculoskeletal:     Right lower leg: She exhibits no tenderness. No edema.     Left lower leg: She exhibits no tenderness. No edema.  Skin:    General: Skin is warm.     Capillary Refill: Capillary refill takes less than 2 seconds.     Findings: No erythema or rash.  Neurological:     General: No focal deficit present.     Mental Status: She is alert and oriented to person, place, and time.     Motor: No abnormal muscle tone.     Coordination: Coordination normal.     Deep Tendon Reflexes: Reflexes are normal and symmetric.      ED Treatments / Results  Labs (all labs ordered are listed, but only abnormal results are displayed) Labs Reviewed  BASIC METABOLIC PANEL  CBC  HEPATIC FUNCTION PANEL  MAGNESIUM  LIPASE, BLOOD  TROPONIN I (HIGH SENSITIVITY)  TROPONIN I (HIGH SENSITIVITY)    EKG EKG Interpretation  Date/Time:  Monday July 06 2019 13:04:14 EST Ventricular Rate:  75 PR Interval:  124 QRS Duration: 82 QT Interval:  380 QTC Calculation: 424 R Axis:   36 Text Interpretation: Normal sinus rhythm Normal ECG No significant change since last tracing Confirmed by Jacalyn Lefevre [phone]) on 07/06/2019 1:07:33 PM   Radiology Dg Chest 2 View  Result Date: 07/06/2019 CLINICAL DATA:  Chest pain EXAM: CHEST - 2 VIEW COMPARISON:  October 27, 2018 FINDINGS: Lungs are clear.  Heart size and pulmonary vascularity are normal. No adenopathy. No pneumothorax.  No bone lesions. Postoperative changes noted in the mid lumbar region. IMPRESSION: No edema or consolidation.  Stable cardiac silhouette. Electronically Signed   By: Lowella Grip III M.D.   On: 07/06/2019 13:26    Procedures Procedures (including critical care time)  Medications Ordered in ED Medications - No data to display   Initial Impression / Assessment and Plan / ED Course  I have reviewed the triage vital signs and the nursing notes.  Pertinent labs & imaging results that were available during my care of the patient were reviewed by me and considered in my medical decision making (see chart for details).        Angel [name] is a 43 y.o. female with a past medical history significant for migraines, sickle cell trait, and pulmonary hypertension who presents with chest discomfort.  Patient reports that starting 11 AM this morning, she has had 5 episodes of several seconds of sharp, electrical chest pain in her left chest.  Reports has never had this discomfort before.  She denies known trauma or overexertion.  She reports it is not exertional but it is worsened with deep breathing.  She reports no shortness of breath with it.  No recent fevers, chills, congestion, cough.  No urinary symptoms or GI symptoms.  She reported feeling somewhat lightheaded with it but that resolved.  She reports feeling some associated palpitations but since coming to the emergency department she has had no symptoms.  She reports the pain got up to an 8 out of 10 in severity at its worst but is currently gone.  She does report a family history of early heart disease.  She has no history of DVT or PE.  She reports that in the past she had a positive D-dimer and had negative work-up for thromboembolism.  She reports that more recently she had a negative D-dimer.  She reports that she is currently scheduled to get a stress test  from a cardiologist for some exertional shortness of breath she was having.  She reports she is not currently having it now.  She is in the process of this work-up.  On exam, lungs are clear and chest is nontender.  No murmur appreciated.  Good pulses in all extremities.  No lower extremity tenderness or swelling seen.  Patient resting comfortably on room air.  Abdomen nontender.  Patient well-appearing.  EKG shows no STEMI.  Patient has a heart score of 1.  Based on the patient description of symptoms, I am most concerned about symptomatic palpitations versus musculoskeletal chest discomfort with muscle spasms.  I have a very low suspicion for ACS or a cardiac injury etiology of symptoms.  Had a shared decision-making conversation with patient and we agreed to hold on D-dimer given her lack of shortness of breath, vital sign abnormalities, and recently negative D-dimer.  We did agree to get a delta troponin and labs to look for electrolyte imbalance which may contribute to symptomatic palpitations.  If work-up is reassuring, dissipate instructions to follow-up with her cardiologist for possible event monitor or Holter placement as well as continue her stress test work-up.  Anticipate reassessment after work-up.  5:53 PM Work-up returned completely reassuring.  We not see any arrhythmias or abnormalities on her telemetry.  Patient agreed to plan to call her cardiologist tomorrow to discuss event monitor or Holter monitor as well as continued her outpatient cardiology work-up.  Due to the concern for being palpitations versus muscular pain, we did feels appropriate to  give her prescription for muscle relaxant in case she started having more muscular pains.  Patient agreed with this plan and with close follow-up instructions.  Patient had no other questions or concerns and was discharged in good condition.   Final Clinical Impressions(s) / ED Diagnoses   Final diagnoses:  Chest pain,  unspecified type  Palpitations  Atypical chest pain    ED Discharge Orders         Ordered    cyclobenzaprine (FLEXERIL) 10 MG tablet  2 times daily PRN     07/06/19 1755          Clinical Impression: 1. Chest pain, unspecified type   2. Palpitations   3. Atypical chest pain     Disposition: Discharge  Condition: Good  I have discussed the results, Dx and Tx plan with the pt(& family if present). He/she/they expressed understanding and agree(s) with the plan. Discharge instructions discussed at great length. Strict return precautions discussed and pt &/or family have verbalized understanding of the instructions. No further questions at time of discharge.    New Prescriptions   CYCLOBENZAPRINE (FLEXERIL) 10 MG TABLET    Take 1 tablet (10 mg total) by mouth 2 (two) times daily as needed for muscle spasms.    Follow Up: Tresa Garterlotnikov, Aleksei V, MD [address] [place] [id number] [phone]     [facility] [address] [id number] QI [id number] [address] [place] [id number] [phone]       Jolayne Branson, Canary Brimhristopher J, MD 07/06/19 1756

## 2019-07-06 NOTE — ED Triage Notes (Signed)
Pt states that she felt like she "was having electrical shocks" today in her heart. Pt reports that she is due for a stress test, has recently had a echo and was told that she may have pulmonary hypertension.

## 2019-07-06 NOTE — Discharge Instructions (Signed)
Your work-up today was overall reassuring however given your description of symptoms, we suspect either you are having musculoskeletal type electrical pains in your left pectoral muscle with spasm versus symptomatic palpitations.  Please fill the prescription for the muscle relaxant to use if you start having more muscular type pains.  Please call your cardiologist tomorrow to discuss event monitor to capture the palpitations.  Please continue your cardiology work-up as we discussed.  If any symptoms change or worsen, please return to the nearest emergency department.

## 2019-08-04 ENCOUNTER — Encounter: Payer: Self-pay | Admitting: Internal Medicine

## 2019-08-04 ENCOUNTER — Ambulatory Visit (INDEPENDENT_AMBULATORY_CARE_PROVIDER_SITE_OTHER): Payer: 59 | Admitting: Internal Medicine

## 2019-08-04 ENCOUNTER — Ambulatory Visit (INDEPENDENT_AMBULATORY_CARE_PROVIDER_SITE_OTHER)
Admission: RE | Admit: 2019-08-04 | Discharge: 2019-08-04 | Disposition: A | Discharge status: Home / Self Care | Payer: 59 | Source: Ambulatory Visit | Attending: Internal Medicine | Admitting: Internal Medicine

## 2019-08-04 ENCOUNTER — Other Ambulatory Visit: Payer: Self-pay

## 2019-08-04 DIAGNOSIS — S93402A Sprain of unspecified ligament of left ankle, initial encounter: Secondary | ICD-10-CM | POA: Insufficient documentation

## 2019-08-04 DIAGNOSIS — S93402D Sprain of unspecified ligament of left ankle, subsequent encounter: Secondary | ICD-10-CM

## 2019-08-04 NOTE — Progress Notes (Signed)
Subjective:  Patient ID: Angel [name], female    DOB: [birth year]  Age: 43 y.o. MRN: [id number]  CC: No chief complaint on file.   HPI Angel [name] presents for a fall down steps 6-7 wks ago - hurt L ankle. Went to [facility] and had a nl X ray Pain is worse at night  Outpatient Medications Prior to Visit  Medication Sig Dispense Refill  . acetaminophen (TYLENOL) 500 MG tablet Take 500 mg by mouth every 6 (six) hours as needed.    Marland Kitchen albuterol (PROVENTIL HFA;VENTOLIN HFA) 108 (90 Base) MCG/ACT inhaler Inhale 2 puffs into the lungs every 6 (six) hours as needed for wheezing or shortness of breath. 1 Inhaler 1  . Cholecalciferol (VITAMIN D3) 2000 units capsule Take 1 capsule (2,000 Units total) by mouth daily. 100 capsule 3  . clindamycin (CLINDAGEL) 1 % gel Apply 1 application topically daily.   11  . cyclobenzaprine (FLEXERIL) 10 MG tablet Take 1 tablet (10 mg total) by mouth 2 (two) times daily as needed for muscle spasms. 20 tablet 0  . hydrocortisone (ANUSOL-HC) 2.5 % rectal cream Use bid 30 g 1  . hydrocortisone (ANUSOL-HC) 25 MG suppository Place 1 suppository (25 mg total) rectally 2 (two) times daily as needed for hemorrhoids or itching. 20 suppository 0  . ibuprofen (ADVIL,MOTRIN) 800 MG tablet Take 1 tablet (800 mg total) by mouth every 8 (eight) hours as needed. 30 tablet 0  . spironolactone (ALDACTONE) 50 MG tablet     . SUMAtriptan (IMITREX) 100 MG tablet Take 1 tablet (100 mg total) by mouth every 2 (two) hours as needed for migraine. May repeat in 2 hours if headache persists or recurs. 12 tablet 3  . traZODone (DESYREL) 50 MG tablet TAKE 1/2-1 TABLETS (25-50 MG TOTAL) BY MOUTH AT BEDTIME AS NEEDED FOR SLEEP (INSOMNIA). 90 tablet 1  . tretinoin (RETIN-A) 0.025 % cream Apply topically.    . triamcinolone cream (KENALOG) 0.1 % triamcinolone acetonide 0.1 % topical cream     No facility-administered medications prior to visit.    ROS: Review of Systems  Constitutional:  Negative for fever.  Musculoskeletal: Positive for gait problem and joint swelling. Negative for back pain.    Objective:  BP 106/68 (BP Location: Left Arm, Patient Position: Sitting, Cuff Size: Normal)   Pulse (!) 55   Temp 98.2 F (36.8 C) (Oral)   Ht 5\' 6"  (1.676 m)   Wt 200 lb (90.7 kg)   SpO2 99%   BMI 32.28 kg/m   BP Readings from Last 3 Encounters:  08/04/19 106/68  07/06/19 110/73  06/10/19 104/62    Wt Readings from Last 3 Encounters:  08/04/19 200 lb (90.7 kg)  07/06/19 195 lb (88.5 kg)  06/10/19 198 lb 1.9 oz (89.9 kg)    Physical Exam Constitutional:      Appearance: She is obese.  Musculoskeletal:        General: Tenderness and signs of injury present. No deformity.  Neurological:     Mental Status: She is oriented to person, place, and time.     Coordination: Coordination normal.     Gait: Gait abnormal.   L ankle - tender LAT>MEDIAL, worse w/ROM  Lab Results  Component Value Date   WBC 5.6 07/06/2019   HGB 13.4 07/06/2019   HCT 40.6 07/06/2019   PLT 279 07/06/2019   GLUCOSE 94 07/06/2019   CHOL 181 06/08/2019   TRIG 70.0 06/08/2019   HDL 66.40 06/08/2019  LDLCALC 101 (H) 06/08/2019   ALT 15 07/06/2019   AST 16 07/06/2019   NA 137 07/06/2019   K 3.8 07/06/2019   CL 106 07/06/2019   CREATININE 0.92 07/06/2019   BUN 17 07/06/2019   CO2 23 07/06/2019   TSH 2.80 06/08/2019   HGBA1C 5.6 10/18/2014    DG Chest 2 View  Result Date: 07/06/2019 CLINICAL DATA:  Chest pain EXAM: CHEST - 2 VIEW COMPARISON:  October 27, 2018 FINDINGS: Lungs are clear. Heart size and pulmonary vascularity are normal. No adenopathy. No pneumothorax. No bone lesions. Postoperative changes noted in the mid lumbar region. IMPRESSION: No edema or consolidation.  Stable cardiac silhouette. Electronically Signed   By: Bretta Bang III M.D.   On: 07/06/2019 13:26    Assessment & Plan:   There are no diagnoses linked to this encounter.   No orders of the defined  types were placed in this encounter.    Follow-up: No follow-ups on file.  Sonda Primes, MD

## 2019-08-04 NOTE — Patient Instructions (Signed)
Ankle Sprain, Phase I Rehab An ankle sprain is an injury to the ligaments of your ankle. Ankle sprains cause stiffness, loss of motion, and loss of strength. Ask your health care provider which exercises are safe for you. Do exercises exactly as told by your health care provider and adjust them as directed. It is normal to feel mild stretching, pulling, tightness, or discomfort as you do these exercises. Stop right away if you feel sudden pain or your pain gets worse. Do not begin these exercises until told by your health care provider. Stretching and range-of-motion exercises These exercises warm up your muscles and joints and improve the movement and flexibility of your lower leg and ankle. These exercises also help to relieve pain and stiffness. Gastroc and soleus stretch This exercise is also called a calf stretch. It stretches the muscles in the back of the lower leg. These muscles are the gastrocnemius, or gastroc, and the soleus. 1. Sit on the floor with your left / right leg extended. 2. Loop a belt or towel around the ball of your left / right foot. The ball of your foot is on the walking surface, right under your toes. 3. Keep your left / right ankle and foot relaxed and keep your knee straight while you use the belt or towel to pull your foot toward you. You should feel a gentle stretch behind your calf or knee in your gastroc muscle. 4. Hold this position for __________ seconds, then release to the starting position. 5. Repeat the exercise with your knee bent. You can put a pillow or a rolled bath towel under your knee to support it. You should feel a stretch deep in your calf in the soleus muscle or at your Achilles tendon. Repeat __________ times. Complete this exercise __________ times a day. Ankle alphabet  1. Sit with your left / right leg supported at the lower leg. ? Do not rest your foot on anything. ? Make sure your foot has room to move freely. 2. Think of your left / right  foot as a paintbrush. ? Move your foot to trace each letter of the alphabet in the air. Keep your hip and knee still while you trace. ? Make the letters as large as you can without feeling discomfort. 3. Trace every letter from A to Z. Repeat __________ times. Complete this exercise __________ times a day. Strengthening exercises These exercises build strength and endurance in your ankle and lower leg. Endurance is the ability to use your muscles for a long time, even after they get tired. Ankle dorsiflexion  1. Secure a rubber exercise band or tube to an object, such as a table leg, that will stay still when the band is pulled. Secure the other end around your left / right foot. 2. Sit on the floor facing the object, with your left / right leg extended. The band or tube should be slightly tense when your foot is relaxed. 3. Slowly bring your foot toward you, bringing the top of your foot toward your shin (dorsiflexion), and pulling the band tighter. 4. Hold this position for __________ seconds. 5. Slowly return your foot to the starting position. Repeat __________ times. Complete this exercise __________ times a day. Ankle plantar flexion  1. Sit on the floor with your left / right leg extended. 2. Loop a rubber exercise tube or band around the ball of your left / right foot. The ball of your foot is on the walking surface, right under your   toes. ? Hold the ends of the band or tube in your hands. ? The band or tube should be slightly tense when your foot is relaxed. 3. Slowly point your foot and toes downward to tilt the top of your foot away from your shin (plantar flexion). 4. Hold this position for __________ seconds. 5. Slowly return your foot to the starting position. Repeat __________ times. Complete this exercise __________ times a day. Ankle eversion 1. Sit on the floor with your legs straight out in front of you. 2. Loop a rubber exercise band or tube around the ball of your left  / right foot. The ball of your foot is on the walking surface, right under your toes. ? Hold the ends of the band in your hands, or secure the band to a stable object. ? The band or tube should be slightly tense when your foot is relaxed. 3. Slowly push your foot outward, away from your other leg (eversion). 4. Hold this position for __________ seconds. 5. Slowly return your foot to the starting position. Repeat __________ times. Complete this exercise __________ times a day. This information is not intended to replace advice given to you by your health care provider. Make sure you discuss any questions you have with your health care provider. Document Released: 02/28/2005 Document Revised: 11/18/2018 Document Reviewed: 05/12/2018 Elsevier Patient Education  2020 Elsevier Inc.  

## 2019-08-04 NOTE — Assessment & Plan Note (Signed)
s/p a fall down steps 6-7 wks ago - hurt L ankle. Went to [facility] and had a nl X ray Repeat X ray ACE wrap

## 2019-08-19 ENCOUNTER — Ambulatory Visit (INDEPENDENT_AMBULATORY_CARE_PROVIDER_SITE_OTHER): Payer: 59 | Admitting: Family

## 2019-08-19 ENCOUNTER — Encounter: Payer: Self-pay | Admitting: Family

## 2019-08-19 DIAGNOSIS — J309 Allergic rhinitis, unspecified: Secondary | ICD-10-CM | POA: Diagnosis not present

## 2019-08-19 MED ORDER — LEVOCETIRIZINE DIHYDROCHLORIDE 5 MG PO TABS: 5.0000 mg | ORAL_TABLET | Freq: Every evening | ORAL | 5 refills | Status: DC

## 2019-08-19 MED ORDER — FLUTICASONE PROPIONATE 50 MCG/ACT NA SUSP: 2.0000 | Freq: Every day | NASAL | 6 refills | Status: DC

## 2019-08-19 NOTE — Progress Notes (Signed)
Angel [name] is a 44 y.o. female with the following history as recorded in [employer]:  Patient Active Problem List   Diagnosis Date Noted  . Left ankle sprain 08/04/2019  . DOE (dyspnea on exertion) 06/10/2019  . Overweight 06/10/2019  . Pulmonary hypertension (HCC) 06/08/2019  . Influenza A 10/29/2018  . Kidney cysts 05/07/2018  . Cough 04/03/2018  . Wheezing 04/03/2018  . URI (upper respiratory infection) 03/16/2018  . Acne 11/01/2017  . 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 CAP (community acquired pneumonia) 06/04/2017  . Fatigue 06/04/2017  . Neck pain 04/16/2017  . Obesity (BMI 30-39.9) 04/05/2017  . Impacted cerumen of right ear 01/19/2016  . GERD (gastroesophageal reflux disease) 06/02/2015  . Elevated glucose 10/18/2014  . Boil of trunk 06/08/2014  . Pain, abdominal, RLQ 10/16/2013  . Insomnia 10/07/2013  . Mass of multiple sites of left breast 06/08/2013  . Breast mass, right 06/08/2013  . Right lumbar radiculopathy 04/22/2013  . Low back pain 06/27/2012  . Bloating symptom 04/30/2012  . Migraine headache   . Left foot pain 09/19/2011  . Situational mixed anxiety and depressive disorder 06/18/2011  . Menorrhagia 06/18/2011  . Well adult exam 12/20/2010  . FREQUENCY, URINARY 02/16/2010  . PARESTHESIA 11/01/2008  . Chest pain, unspecified 03/26/2008  . WEIGHT GAIN 12/10/2007  . Vitamin D deficiency 09/03/2007  . INSOMNIA 05/31/2007  . Sickle-cell trait (HCC) 05/20/2007  . Migraine without aura 05/20/2007  . Allergic rhinitis 05/20/2007    Current Outpatient Medications  Medication Sig Dispense Refill  . acetaminophen (TYLENOL) 500 MG tablet Take 500 mg by mouth every 6 (six) hours as needed.    Marland Kitchen albuterol (PROVENTIL HFA;VENTOLIN HFA) 108 (90 Base) MCG/ACT inhaler Inhale 2 puffs into the lungs every 6 (six) hours as needed for wheezing or shortness of breath. 1 Inhaler 1  . Cholecalciferol (VITAMIN D3) 2000 units capsule Take 1 capsule (2,000 Units total) by  mouth daily. 100 capsule 3  . clindamycin (CLINDAGEL) 1 % gel Apply 1 application topically daily.   11  . cyclobenzaprine (FLEXERIL) 10 MG tablet Take 1 tablet (10 mg total) by mouth 2 (two) times daily as needed for muscle spasms. 20 tablet 0  . fluticasone (FLONASE) 50 MCG/ACT nasal spray Place 2 sprays into both nostrils daily. 16 g 6  . hydrocortisone (ANUSOL-HC) 2.5 % rectal cream Use bid 30 g 1  . hydrocortisone (ANUSOL-HC) 25 MG suppository Place 1 suppository (25 mg total) rectally 2 (two) times daily as needed for hemorrhoids or itching. 20 suppository 0  . ibuprofen (ADVIL,MOTRIN) 800 MG tablet Take 1 tablet (800 mg total) by mouth every 8 (eight) hours as needed. 30 tablet 0  . levocetirizine (XYZAL) 5 MG tablet Take 1 tablet (5 mg total) by mouth every evening. 30 tablet 5  . spironolactone (ALDACTONE) 50 MG tablet     . SUMAtriptan (IMITREX) 100 MG tablet Take 1 tablet (100 mg total) by mouth every 2 (two) hours as needed for migraine. May repeat in 2 hours if headache persists or recurs. 12 tablet 3  . traZODone (DESYREL) 50 MG tablet TAKE 1/2-1 TABLETS (25-50 MG TOTAL) BY MOUTH AT BEDTIME AS NEEDED FOR SLEEP (INSOMNIA). 90 tablet 1  . tretinoin (RETIN-A) 0.025 % cream Apply topically.    . triamcinolone cream (KENALOG) 0.1 % triamcinolone acetonide 0.1 % topical cream     No current facility-administered medications for this visit.    Allergies: Zolpidem  Past Medical History:  Diagnosis Date  . Breast  mass 06/2013   bilateral  . GERD (gastroesophageal reflux disease) 08/2014   takes Protonix daily  . Herniated disc   . Migraine headache   . Sickle cell trait ([place])   . Vitamin D deficiency 2009    Past Surgical History:  Procedure Laterality Date  . BILATERAL SALPINGECTOMY  09/26/2015   Procedure: BILATERAL SALPINGECTOMY;  Surgeon: Everett Graff, MD;  Location: [facility];  Service: Gynecology;;  . BREAST BIOPSY Bilateral 07/13/2013   Procedure: BILATERAL EXCISION BREAS  MASSES;  Surgeon: Harl Bowie, MD;  Location: [facility];  Service: General;  Laterality: Bilateral;  . BREAST CYST EXCISION Left 2014   4 cysts removed  . DE QUERVAIN'S RELEASE Right 09/06/2003  . DILITATION & CURRETTAGE/HYSTROSCOPY WITH NOVASURE ABLATION N/A 09/26/2015   Procedure: DILATATION & CURETTAGE/HYSTEROSCOPY WITH NOVASURE ABLATION;  Surgeon: Everett Graff, MD;  Location: [facility];  Service: Gynecology;  Laterality: N/A;  . LAPAROSCOPIC OVARIAN CYSTECTOMY Right 09/26/2015   Procedure: LAPAROSCOPIC Right OVARIAN CYSTECTOMY, Bilateral Peritoneal Biopsies over Uteralsacral area;  Surgeon: Everett Graff, MD;  Location: [facility];  Service: Gynecology;  Laterality: Right;  . SPINAL FUSION  07/05/2018  . TUBAL LIGATION  12/27/2005  . WISDOM TOOTH EXTRACTION    . WRIST GANGLION EXCISION Right 09/06/2003  . WRIST GANGLION EXCISION Left 09/04/1999    Family History  Problem Relation Age of Onset  . Hypertension Father   . Diabetes Father   . Heart disease Father   . Cancer Father        prostate  . Sickle cell anemia Daughter   . Sickle cell anemia Son     Social History   Tobacco Use  . Smoking status: Never Smoker  . Smokeless tobacco: Never Used  Substance Use Topics  . Alcohol use: No    Subjective:    I connected with Angel [name] on 08/19/19 at  8:40 AM EST by a video enabled telemedicine application and verified that I am speaking with the correct person using two identifiers. Provider in office/ patient is at home; provider and patient are only 2 people on video call.    I discussed the limitations of evaluation and management by telemedicine and the availability of in person appointments. The patient expressed understanding and agreed to proceed.  Patient has concerns for "sinus congestion." Feels like her nose is constantly running; no known exposure to COVID but did get tested yesterday and test is pending; no fever; has been using OTC Allegra with  limited benefit; no chest pain or shortness of breath;    Objective:  There were no vitals filed for this visit.  General: Well developed, well nourished, in no acute distress  Lungs: Respirations unlabored;  Neurologic: Alert and oriented; speech intact   1. Allergic rhinitis, unspecified seasonality, unspecified trigger     Plan: COVID test results are pending; low suspicion for infection at this time; trial of Xyzal and Flonase; increase fluids, rest and follow-up worse, no better.   No follow-ups on file.  No orders of the defined types were placed in this encounter.   Requested Prescriptions   Signed Prescriptions Disp Refills  . fluticasone (FLONASE) 50 MCG/ACT nasal spray 16 g 6    Sig: Place 2 sprays into both nostrils daily.  Marland Kitchen levocetirizine (XYZAL) 5 MG tablet 30 tablet 5    Sig: Take 1 tablet (5 mg total) by mouth every evening.

## 2019-09-02 ENCOUNTER — Ambulatory Visit: Payer: 59 | Admitting: Cardiology

## 2019-09-03 ENCOUNTER — Ambulatory Visit: Payer: 59 | Admitting: Cardiology

## 2019-09-04 ENCOUNTER — Ambulatory Visit: Payer: 59 | Admitting: Cardiology

## 2019-09-04 ENCOUNTER — Encounter: Payer: Self-pay | Admitting: Cardiology

## 2019-09-04 ENCOUNTER — Other Ambulatory Visit: Payer: Self-pay

## 2019-09-04 VITALS — BP 108/70 | HR 76 | Ht 66.0 in | Wt 206.0 lb

## 2019-09-04 DIAGNOSIS — E663 Overweight: Secondary | ICD-10-CM

## 2019-09-04 DIAGNOSIS — R011 Cardiac murmur, unspecified: Secondary | ICD-10-CM | POA: Diagnosis not present

## 2019-09-04 DIAGNOSIS — R9431 Abnormal electrocardiogram [ECG] [EKG]: Secondary | ICD-10-CM

## 2019-09-04 DIAGNOSIS — R0609 Other forms of dyspnea: Secondary | ICD-10-CM

## 2019-09-04 DIAGNOSIS — R0600 Dyspnea, unspecified: Secondary | ICD-10-CM

## 2019-09-04 HISTORY — DX: Overweight: E66.3

## 2019-09-04 HISTORY — DX: Cardiac murmur, unspecified: R01.1

## 2019-09-04 NOTE — Patient Instructions (Signed)
Medication Instructions:  Your physician recommends that you continue on your current medications as directed. Please refer to the Current Medication list given to you today.  *If you need a refill on your cardiac medications before your next appointment, please call your pharmacy*  Lab Work: Your physician recommends that you have a BMP drawn today  If you have labs (blood work) drawn today and your tests are completely normal, you will receive your results only by: Marland Kitchen MyChart Message (if you have MyChart) OR . A paper copy in the mail If you have any lab test that is abnormal or we need to change your treatment, we will call you to review the results.  Testing/Procedures: Your physician has requested that you have an echocardiogram. Echocardiography is a painless test that uses sound waves to create images of your heart. It provides your doctor with information about the size and shape of your heart and how well your heart's chambers and valves are working. This procedure takes approximately one hour. There are no restrictions for this procedure.    Follow-Up: At [facility], you and your health needs are our priority.  As part of our continuing mission to provide you with exceptional heart care, we have created designated Provider Care Teams.  These Care Teams include your primary Cardiologist (physician) and Advanced Practice Providers (APPs -  Physician Assistants and Nurse Practitioners) who all work together to provide you with the care you need, when you need it.  Your next appointment:   3 month(s)  The format for your next appointment:   In Person  Provider:   Belva Crome, MD  Other Instructions  Echocardiogram An echocardiogram is a procedure that uses painless sound waves (ultrasound) to produce an image of the heart. Images from an echocardiogram can provide important information about:  Signs of coronary artery disease (CAD).  Aneurysm detection. An aneurysm is  a weak or damaged part of an artery wall that bulges out from the normal force of blood pumping through the body.  Heart size and shape. Changes in the size or shape of the heart can be associated with certain conditions, including heart failure, aneurysm, and CAD.  Heart muscle function.  Heart valve function.  Signs of a past heart attack.  Fluid buildup around the heart.  Thickening of the heart muscle.  A tumor or infectious growth around the heart valves. Tell a health care provider about:  Any allergies you have.  All medicines you are taking, including vitamins, herbs, eye drops, creams, and over-the-counter medicines.  Any blood disorders you have.  Any surgeries you have had.  Any medical conditions you have.  Whether you are pregnant or may be pregnant. What are the risks? Generally, this is a safe procedure. However, problems may occur, including:  Allergic reaction to dye (contrast) that may be used during the procedure. What happens before the procedure? No specific preparation is needed. You may eat and drink normally. What happens during the procedure?   An IV tube may be inserted into one of your veins.  You may receive contrast through this tube. A contrast is an injection that improves the quality of the pictures from your heart.  A gel will be applied to your chest.  A wand-like tool (transducer) will be moved over your chest. The gel will help to transmit the sound waves from the transducer.  The sound waves will harmlessly bounce off of your heart to allow the heart images to be captured  in real-time motion. The images will be recorded on a computer. The procedure may vary among health care providers and hospitals. What happens after the procedure?  You may return to your normal, everyday life, including diet, activities, and medicines, unless your health care provider tells you not to do that. Summary  An echocardiogram is a procedure that  uses painless sound waves (ultrasound) to produce an image of the heart.  Images from an echocardiogram can provide important information about the size and shape of your heart, heart muscle function, heart valve function, and fluid buildup around your heart.  You do not need to do anything to prepare before this procedure. You may eat and drink normally.  After the echocardiogram is completed, you may return to your normal, everyday life, unless your health care provider tells you not to do that. This information is not intended to replace advice given to you by your health care provider. Make sure you discuss any questions you have with your health care provider. Document Revised: 11/20/2018 Document Reviewed: 09/01/2016 Elsevier Patient Education  [place].

## 2019-09-04 NOTE — Progress Notes (Signed)
Cardiology Office Note:    Date:  09/04/2019   ID:  Angel [name], DOB 1976-04-01, MRN [id number]  PCP:  Cassandria Anger, MD  Cardiologist:  Jenean Lindau, MD   Referring MD: Cassandria Anger, MD    ASSESSMENT:    1. DOE (dyspnea on exertion)   2. Abnormal electrocardiogram   3. Cardiac murmur   4. Overweight    PLAN:    In order of problems listed above:  1. Cardiac murmur: I discussed my findings with her at length and echocardiogram will be done to assess murmur heard on auscultation. 2. Dyspnea on exertion: She says this has gotten better.  He wanted to do a stress test but her insurance company has denied it.  Patient is not keen on it at this time as her symptoms have resolved. 3. She was found to have hypokalemia in the last evaluation and she will have a Chem-7. 4. Exercise and weight loss stressed she promises to do better with diet also.Patient will be seen in follow-up appointment in 3 months or earlier if the patient has any concerns    Medication Adjustments/Labs and Tests Ordered: Current medicines are reviewed at length with the patient today.  Concerns regarding medicines are outlined above.  Orders Placed This Encounter  Procedures  . Basic Metabolic Panel (BMET)  . ECHOCARDIOGRAM COMPLETE   No orders of the defined types were placed in this encounter.    Chief Complaint  Patient presents with  . Follow-up    3 MO FU      History of Present Illness:    Angel [name] is a 44 y.o. female.  Patient was evaluated by me for dyspnea on exertion.  She tells me that this has gotten better.  She fell down and sprained her ankle which is why she is not exercising regularly.  No chest pain orthopnea or PND.  At the time of my evaluation, the patient is alert awake oriented and in no distress.  Past Medical History:  Diagnosis Date  . Breast mass 06/2013   bilateral  . GERD (gastroesophageal reflux disease) 08/2014   takes Protonix daily    . Herniated disc   . Migraine headache   . Sickle cell trait ([place])   . Vitamin D deficiency 2009    Past Surgical History:  Procedure Laterality Date  . BILATERAL SALPINGECTOMY  09/26/2015   Procedure: BILATERAL SALPINGECTOMY;  Surgeon: Everett Graff, MD;  Location: [facility];  Service: Gynecology;;  . BREAST BIOPSY Bilateral 07/13/2013   Procedure: BILATERAL EXCISION BREAS MASSES;  Surgeon: Harl Bowie, MD;  Location: [facility];  Service: General;  Laterality: Bilateral;  . BREAST CYST EXCISION Left 2014   4 cysts removed  . DE QUERVAIN'S RELEASE Right 09/06/2003  . DILITATION & CURRETTAGE/HYSTROSCOPY WITH NOVASURE ABLATION N/A 09/26/2015   Procedure: DILATATION & CURETTAGE/HYSTEROSCOPY WITH NOVASURE ABLATION;  Surgeon: Everett Graff, MD;  Location: [facility];  Service: Gynecology;  Laterality: N/A;  . LAPAROSCOPIC OVARIAN CYSTECTOMY Right 09/26/2015   Procedure: LAPAROSCOPIC Right OVARIAN CYSTECTOMY, Bilateral Peritoneal Biopsies over Uteralsacral area;  Surgeon: Everett Graff, MD;  Location: [facility];  Service: Gynecology;  Laterality: Right;  . SPINAL FUSION  07/05/2018  . TUBAL LIGATION  12/27/2005  . WISDOM TOOTH EXTRACTION    . WRIST GANGLION EXCISION Right 09/06/2003  . WRIST GANGLION EXCISION Left 09/04/1999    Current Medications: Current Meds  Medication Sig  . acetaminophen (TYLENOL) 500 MG tablet Take 500  mg by mouth every 6 (six) hours as needed.  Marland Kitchen albuterol (PROVENTIL HFA;VENTOLIN HFA) 108 (90 Base) MCG/ACT inhaler Inhale 2 puffs into the lungs every 6 (six) hours as needed for wheezing or shortness of breath.  . Cholecalciferol (VITAMIN D3) 2000 units capsule Take 1 capsule (2,000 Units total) by mouth daily.  . clindamycin (CLINDAGEL) 1 % gel Apply 1 application topically daily.   . fluticasone (FLONASE) 50 MCG/ACT nasal spray Place 2 sprays into both nostrils daily.  . hydrocortisone (ANUSOL-HC) 2.5 % rectal cream Use bid  . hydrocortisone (ANUSOL-HC)  25 MG suppository Place 1 suppository (25 mg total) rectally 2 (two) times daily as needed for hemorrhoids or itching.  Marland Kitchen ibuprofen (ADVIL,MOTRIN) 800 MG tablet Take 1 tablet (800 mg total) by mouth every 8 (eight) hours as needed.  Marland Kitchen levocetirizine (XYZAL) 5 MG tablet Take 1 tablet (5 mg total) by mouth every evening.  Marland Kitchen spironolactone (ALDACTONE) 50 MG tablet   . SUMAtriptan (IMITREX) 100 MG tablet Take 1 tablet (100 mg total) by mouth every 2 (two) hours as needed for migraine. May repeat in 2 hours if headache persists or recurs.  Marland Kitchen tiZANidine (ZANAFLEX) 4 MG tablet Take 4 mg by mouth as needed for muscle spasms.  . traZODone (DESYREL) 50 MG tablet TAKE 1/2-1 TABLETS (25-50 MG TOTAL) BY MOUTH AT BEDTIME AS NEEDED FOR SLEEP (INSOMNIA).  Marland Kitchen tretinoin (RETIN-A) 0.025 % cream Apply topically.  . triamcinolone cream (KENALOG) 0.1 % triamcinolone acetonide 0.1 % topical cream     Allergies:   Other and Zolpidem   Social History   Socioeconomic History  . Marital status: Married    Spouse name: Not on file  . Number of children: 2  . Years of education: Not on file  . Highest education level: Not on file  Occupational History  . Not on file  Tobacco Use  . Smoking status: Never Smoker  . Smokeless tobacco: Never Used  Substance and Sexual Activity  . Alcohol use: No  . Drug use: No  . Sexual activity: Not on file    Comment: BTL  Other Topics Concern  . Not on file  Social History Narrative  . Not on file   Social Determinants of Health   Financial Resource Strain:   . Difficulty of Paying Living Expenses: Not on file  Food Insecurity:   . Worried About Programme researcher, broadcasting/film/video in the Last Year: Not on file  . Ran Out of Food in the Last Year: Not on file  Transportation Needs:   . Lack of Transportation (Medical): Not on file  . Lack of Transportation (Non-Medical): Not on file  Physical Activity:   . Days of Exercise per Week: Not on file  . Minutes of Exercise per Session:  Not on file  Stress:   . Feeling of Stress : Not on file  Social Connections:   . Frequency of Communication with Friends and Family: Not on file  . Frequency of Social Gatherings with Friends and Family: Not on file  . Attends Religious Services: Not on file  . Active Member of Clubs or Organizations: Not on file  . Attends Banker Meetings: Not on file  . Marital Status: Not on file     Family History: The patient's family history includes Cancer in her father; Diabetes in her father; Heart disease in her father; Hypertension in her father; Sickle cell anemia in her daughter and son.  ROS:   Please see the  history of present illness.    All other systems reviewed and are negative.  EKGs/Labs/Other Studies Reviewed:    The following studies were reviewed today: I reviewed blood work and labs with the patient.   Recent Labs: 06/08/2019: TSH 2.80 07/06/2019: ALT 15; BUN 17; Creatinine, Ser 0.92; Hemoglobin 13.4; Magnesium 2.1; Platelets 279; Potassium 3.8; Sodium 137  Recent Lipid Panel    Component Value Date/Time   CHOL 181 06/08/2019 0928   TRIG 70.0 06/08/2019 0928   HDL 66.40 06/08/2019 0928   CHOLHDL 3 06/08/2019 0928   VLDL 14.0 06/08/2019 0928   LDLCALC 101 (H) 06/08/2019 0928    Physical Exam:    VS:  BP 108/70   Pulse 76   Ht 5\' 6"  (1.676 m)   Wt 206 lb (93.4 kg)   SpO2 99%   BMI 33.25 kg/m     Wt Readings from Last 3 Encounters:  09/04/19 206 lb (93.4 kg)  08/04/19 200 lb (90.7 kg)  07/06/19 195 lb (88.5 kg)     GEN: Patient is in no acute distress HEENT: Normal NECK: No JVD; No carotid bruits LYMPHATICS: No lymphadenopathy CARDIAC: Hear sounds regular, 2/6 systolic murmur at the apex. RESPIRATORY:  Clear to auscultation without rales, wheezing or rhonchi  ABDOMEN: Soft, non-tender, non-distended MUSCULOSKELETAL:  No edema; No deformity  SKIN: Warm and dry NEUROLOGIC:  Alert and oriented x 3 PSYCHIATRIC:  Normal affect    Signed, 07/08/19, MD  09/04/2019 3:23 PM    [facility]

## 2019-09-06 LAB — BASIC METABOLIC PANEL
BUN/Creatinine Ratio: 15 (ref 9–23)
BUN: 15 mg/dL (ref 6–24)
CO2: 21 mmol/L (ref 20–29)
Calcium: 9.3 mg/dL (ref 8.7–10.2)
Chloride: 103 mmol/L (ref 96–106)
Creatinine, Ser: 0.97 mg/dL (ref 0.57–1.00)
GFR calc Af Amer: 83 mL/min/{1.73_m2} (ref >59)
GFR calc non Af Amer: 72 mL/min/{1.73_m2} (ref >59)
Glucose: 97 mg/dL (ref 65–99)
Potassium: 4.1 mmol/L (ref 3.5–5.2)
Sodium: 140 mmol/L (ref 134–144)

## 2019-09-07 ENCOUNTER — Telehealth: Payer: Self-pay | Admitting: Internal Medicine

## 2019-09-07 NOTE — Telephone Encounter (Signed)
    Lowella Bandy- Nurse CM from [facility] calling to provide her phone number. She has been trying to contact patient.   [employer] [phone] EXT [phone]

## 2019-09-08 ENCOUNTER — Other Ambulatory Visit: Payer: Self-pay

## 2019-09-08 ENCOUNTER — Ambulatory Visit (HOSPITAL_BASED_OUTPATIENT_CLINIC_OR_DEPARTMENT_OTHER)
Admission: RE | Admit: 2019-09-08 | Discharge: 2019-09-08 | Disposition: A | Discharge status: Home / Self Care | Payer: 59 | Source: Ambulatory Visit | Attending: Cardiology | Admitting: Cardiology

## 2019-09-08 DIAGNOSIS — R9431 Abnormal electrocardiogram [ECG] [EKG]: Secondary | ICD-10-CM | POA: Insufficient documentation

## 2019-09-08 DIAGNOSIS — R0600 Dyspnea, unspecified: Secondary | ICD-10-CM | POA: Diagnosis not present

## 2019-09-08 NOTE — Progress Notes (Signed)
  Echocardiogram 2D Echocardiogram has been performed.  Sinda Du 09/08/2019, 10:38 AM

## 2019-09-17 ENCOUNTER — Encounter: Payer: Self-pay | Admitting: *Deleted

## 2019-10-30 DIAGNOSIS — Z862 Personal history of diseases of the blood and blood-forming organs and certain disorders involving the immune mechanism: Secondary | ICD-10-CM

## 2019-10-30 DIAGNOSIS — Z87448 Personal history of other diseases of urinary system: Secondary | ICD-10-CM | POA: Insufficient documentation

## 2019-10-30 DIAGNOSIS — Z8742 Personal history of other diseases of the female genital tract: Secondary | ICD-10-CM | POA: Insufficient documentation

## 2019-10-30 DIAGNOSIS — N83201 Unspecified ovarian cyst, right side: Secondary | ICD-10-CM | POA: Insufficient documentation

## 2019-10-30 DIAGNOSIS — N940 Mittelschmerz: Secondary | ICD-10-CM | POA: Insufficient documentation

## 2019-10-30 DIAGNOSIS — R87610 Atypical squamous cells of undetermined significance on cytologic smear of cervix (ASC-US): Secondary | ICD-10-CM | POA: Insufficient documentation

## 2019-10-30 HISTORY — DX: Mittelschmerz: N94.0

## 2019-10-30 HISTORY — DX: Unspecified ovarian cyst, right side: N83.201

## 2019-10-30 HISTORY — DX: Personal history of diseases of the blood and blood-forming organs and certain disorders involving the immune mechanism: Z86.2

## 2019-10-30 HISTORY — DX: Personal history of other diseases of urinary system: Z87.448

## 2019-10-30 HISTORY — DX: Personal history of other diseases of the female genital tract: Z87.42

## 2019-10-30 HISTORY — DX: Atypical squamous cells of undetermined significance on cytologic smear of cervix (ASC-US): R87.610

## 2019-11-19 ENCOUNTER — Telehealth: Payer: Self-pay | Admitting: Cardiology

## 2019-11-19 NOTE — Telephone Encounter (Signed)
Patient complaining of low BP (84/60, 90/61) last night and this morning. Patient stated she is feeling dizzy and lightheaded. Patient stated she has not eaten much, due to a reduced appetite. Patient stated she takes spironolactone for acne. Encouraged patient to hold spironolactone for now and to eat a small salty snack and drink some fluids, and to call her PCP about symptoms as well.  Will forward to Dr. Tomie China for further advisement.

## 2019-11-19 NOTE — Telephone Encounter (Signed)
   Pt c/o BP issue: STAT if pt c/o blurred vision, one-sided weakness or slurred speech  1. What are your last 5 BP readings? Last night 84/60, recent 90/61  2. Are you having any other symptoms (ex. Dizziness, headache, blurred vision, passed out)? Dizzy, lightheadedness  3. What is your BP issue? Pt said his BP was very low since last night, she feels lightheadedness.  Please call

## 2019-11-19 NOTE — Telephone Encounter (Signed)
agree

## 2019-12-14 ENCOUNTER — Ambulatory Visit: Payer: 59 | Admitting: Cardiology

## 2019-12-14 ENCOUNTER — Other Ambulatory Visit: Payer: Self-pay

## 2019-12-14 ENCOUNTER — Encounter: Payer: Self-pay | Admitting: Cardiology

## 2019-12-14 VITALS — BP 104/74 | HR 66 | Ht 66.0 in | Wt 202.0 lb

## 2019-12-14 DIAGNOSIS — R0600 Dyspnea, unspecified: Secondary | ICD-10-CM | POA: Diagnosis not present

## 2019-12-14 DIAGNOSIS — E663 Overweight: Secondary | ICD-10-CM

## 2019-12-14 DIAGNOSIS — R0609 Other forms of dyspnea: Secondary | ICD-10-CM

## 2019-12-14 NOTE — Progress Notes (Signed)
Cardiology Office Note:    Date:  12/14/2019   ID:  [name], DOB [birth year], MRN [id number]  PCP:  Tresa Garter, MD  Cardiologist:  Garwin Brothers, MD   Referring MD: Tresa Garter, MD    ASSESSMENT:    1. Overweight   2. DOE (dyspnea on exertion)    PLAN:    In order of problems listed above:  1. Primary prevention stressed with the patient.  Importance of compliance with diet and medication stressed and she vocalized understanding. 2. Her blood pressure is stable and she is taking spironolactone so she will have a Chem-7 today. 3. For shortness of breath I will try to obtain a Lexiscan sestamibi.  We will get in touch with her insurance company about approval. 4. Patient will be seen in follow-up appointment in 6 months or earlier if the patient has any concerns    Medication Adjustments/Labs and Tests Ordered: Current medicines are reviewed at length with the patient today.  Concerns regarding medicines are outlined above.  Orders Placed This Encounter  Procedures  . Basic Metabolic Panel (BMET)  . MYOCARDIAL PERFUSION IMAGING   No orders of the defined types were placed in this encounter.    Chief Complaint  Patient presents with  . Follow-up    3 Months     History of Present Illness:    Angel [name] is a 44 y.o. female.  She has past medical history of obesity and shortness of breath.  She underwent echocardiographic examination which was unremarkable.  Her insurance company denied Lexiscan sestamibi.  She mentions to me that she has shortness of breath on exertion.  This has been going on for the past several months.  She hurt her ankle therefore no her ambulation is fine but she does not exercise.  She mentions to me that she has gained weight because of this.  At the time of my evaluation, the patient is alert awake oriented and in no distress.  Past Medical History:  Diagnosis Date  . Breast mass 06/2013   bilateral  . GERD  (gastroesophageal reflux disease) 08/2014   takes Protonix daily  . Herniated disc   . Migraine headache   . Sickle cell trait (HCC)   . Vitamin D deficiency 2009    Past Surgical History:  Procedure Laterality Date  . BILATERAL SALPINGECTOMY  09/26/2015   Procedure: BILATERAL SALPINGECTOMY;  Surgeon: Osborn Coho, MD;  Location: [facility];  Service: Gynecology;;  . BREAST BIOPSY Bilateral 07/13/2013   Procedure: BILATERAL EXCISION BREAS MASSES;  Surgeon: Shelly Rubenstein, MD;  Location: [facility];  Service: General;  Laterality: Bilateral;  . BREAST CYST EXCISION Left 2014   4 cysts removed  . DE QUERVAIN'S RELEASE Right 09/06/2003  . DILITATION & CURRETTAGE/HYSTROSCOPY WITH NOVASURE ABLATION N/A 09/26/2015   Procedure: DILATATION & CURETTAGE/HYSTEROSCOPY WITH NOVASURE ABLATION;  Surgeon: Osborn Coho, MD;  Location: [facility];  Service: Gynecology;  Laterality: N/A;  . LAPAROSCOPIC OVARIAN CYSTECTOMY Right 09/26/2015   Procedure: LAPAROSCOPIC Right OVARIAN CYSTECTOMY, Bilateral Peritoneal Biopsies over Uteralsacral area;  Surgeon: Osborn Coho, MD;  Location: [facility];  Service: Gynecology;  Laterality: Right;  . SPINAL FUSION  07/05/2018  . TUBAL LIGATION  12/27/2005  . WISDOM TOOTH EXTRACTION    . WRIST GANGLION EXCISION Right 09/06/2003  . WRIST GANGLION EXCISION Left 09/04/1999    Current Medications: Current Meds  Medication Sig  . acetaminophen (TYLENOL) 500 MG tablet Take 500 mg by  mouth every 6 (six) hours as needed.  Marland Kitchen albuterol (PROVENTIL HFA;VENTOLIN HFA) 108 (90 Base) MCG/ACT inhaler Inhale 2 puffs into the lungs every 6 (six) hours as needed for wheezing or shortness of breath.  . betamethasone dipropionate 0.05 % cream Apply topically 2 (two) times daily.  . Cholecalciferol (VITAMIN D3) 2000 units capsule Take 1 capsule (2,000 Units total) by mouth daily.  . clindamycin (CLINDAGEL) 1 % gel Apply 1 application topically daily.   . fluticasone (FLONASE) 50  MCG/ACT nasal spray Place 2 sprays into both nostrils daily.  . hydrocortisone (ANUSOL-HC) 2.5 % rectal cream Use bid  . hydrocortisone (ANUSOL-HC) 25 MG suppository Place 1 suppository (25 mg total) rectally 2 (two) times daily as needed for hemorrhoids or itching.  Marland Kitchen ibuprofen (ADVIL,MOTRIN) 800 MG tablet Take 1 tablet (800 mg total) by mouth every 8 (eight) hours as needed.  Marland Kitchen levocetirizine (XYZAL) 5 MG tablet Take 1 tablet (5 mg total) by mouth every evening.  Marland Kitchen Phentermine HCl (LOMAIRA) 8 MG TABS Take by mouth.  . spironolactone (ALDACTONE) 50 MG tablet   . SUMAtriptan (IMITREX) 100 MG tablet Take 1 tablet (100 mg total) by mouth every 2 (two) hours as needed for migraine. May repeat in 2 hours if headache persists or recurs.  Marland Kitchen tiZANidine (ZANAFLEX) 4 MG tablet Take 4 mg by mouth as needed for muscle spasms.  Marland Kitchen tretinoin (RETIN-A) 0.025 % cream Apply topically.  . triamcinolone cream (KENALOG) 0.1 % triamcinolone acetonide 0.1 % topical cream     Allergies:   Other and Zolpidem   Social History   Socioeconomic History  . Marital status: Married    Spouse name: Not on file  . Number of children: 2  . Years of education: Not on file  . Highest education level: Not on file  Occupational History  . Not on file  Tobacco Use  . Smoking status: Never Smoker  . Smokeless tobacco: Never Used  Substance and Sexual Activity  . Alcohol use: No  . Drug use: No  . Sexual activity: Not on file    Comment: BTL  Other Topics Concern  . Not on file  Social History Narrative  . Not on file   Social Determinants of Health   Financial Resource Strain:   . Difficulty of Paying Living Expenses:   Food Insecurity:   . Worried About Charity fundraiser in the Last Year:   . Arboriculturist in the Last Year:   Transportation Needs:   . Film/video editor (Medical):   Marland Kitchen Lack of Transportation (Non-Medical):   Physical Activity:   . Days of Exercise per Week:   . Minutes of Exercise  per Session:   Stress:   . Feeling of Stress :   Social Connections:   . Frequency of Communication with Friends and Family:   . Frequency of Social Gatherings with Friends and Family:   . Attends Religious Services:   . Active Member of Clubs or Organizations:   . Attends Archivist Meetings:   Marland Kitchen Marital Status:      Family History: The patient's family history includes Cancer in her father; Diabetes in her father; Heart disease in her father; Hypertension in her father; Sickle cell anemia in her daughter and son.  ROS:   Please see the history of present illness.    All other systems reviewed and are negative.  EKGs/Labs/Other Studies Reviewed:    The following studies were reviewed today: IMPRESSIONS  1. Normal study  2. Left ventricular ejection fraction, by visual estimation, is 60 to  65%. There is no left ventricular hypertrophy.  3. The left ventricle has no regional wall motion abnormalities.  4. Global right ventricle has normal systolic function.The right  ventricular size is normal. No increase in right ventricular wall  thickness.  5. Left atrial size was normal.  6. Right atrial size was normal.  7. The mitral valve is normal in structure. No evidence of mitral valve  regurgitation. No evidence of mitral stenosis.  8. The tricuspid valve is normal in structure. Tricuspid valve  regurgitation is trivial.  9. The aortic valve is normal in structure. Aortic valve regurgitation is  not visualized. No evidence of aortic valve sclerosis or stenosis.  10. The pulmonic valve was normal in structure. Pulmonic valve  regurgitation is not visualized.  11. Normal pulmonary artery systolic pressure.    Recent Labs: 06/08/2019: TSH 2.80 07/06/2019: ALT 15; Hemoglobin 13.4; Magnesium 2.1; Platelets 279 09/04/2019: BUN 15; Creatinine, Ser 0.97; Potassium 4.1; Sodium 140  Recent Lipid Panel    Component Value Date/Time   CHOL 181 06/08/2019 0928    TRIG 70.0 06/08/2019 0928   HDL 66.40 06/08/2019 0928   CHOLHDL 3 06/08/2019 0928   VLDL 14.0 06/08/2019 0928   LDLCALC 101 (H) 06/08/2019 0928    Physical Exam:    VS:  BP 104/74   Pulse 66   Ht 5\' 6"  (1.676 m)   Wt 202 lb (91.6 kg)   SpO2 100%   BMI 32.60 kg/m     Wt Readings from Last 3 Encounters:  12/14/19 202 lb (91.6 kg)  09/04/19 206 lb (93.4 kg)  08/04/19 200 lb (90.7 kg)     GEN: Patient is in no acute distress HEENT: Normal NECK: No JVD; No carotid bruits LYMPHATICS: No lymphadenopathy CARDIAC: Hear sounds regular, 2/6 systolic murmur at the apex. RESPIRATORY:  Clear to auscultation without rales, wheezing or rhonchi  ABDOMEN: Soft, non-tender, non-distended MUSCULOSKELETAL:  No edema; No deformity  SKIN: Warm and dry NEUROLOGIC:  Alert and oriented x 3 PSYCHIATRIC:  Normal affect   Signed, 08/06/19, MD  12/14/2019 3:43 PM    [facility]

## 2019-12-14 NOTE — Patient Instructions (Addendum)
Medication Instructions:  No medication changes *If you need a refill on your cardiac medications before your next appointment, please call your pharmacy*   Lab Work: Your physician recommends that you had a BMET today in the office. If you have labs (blood work) drawn today and your tests are completely normal, you will receive your results only by: Marland Kitchen MyChart Message (if you have MyChart) OR . A paper copy in the mail If you have any lab test that is abnormal or we need to change your treatment, we will call you to review the results.   Testing/Procedures: Your physician has requested that you have a lexiscan myoview. For further information please visit [email or web address] Please follow instruction sheet, as given.  The test will take approximately 3 to 4 hours to complete; you may bring reading material.  If someone comes with you to your appointment, they will need to remain in the main lobby due to limited space in the testing area. **If you are pregnant or breastfeeding, please notify the nuclear lab prior to your appointment**  How to prepare for your Myocardial Perfusion Test: . Do not eat or drink 3 hours prior to your test, except you may have water. . Do not consume products containing caffeine (regular or decaffeinated) 12 hours prior to your test. (ex: coffee, chocolate, sodas, tea). . Do bring a list of your current medications with you.  If not listed below, you may take your medications as normal. . Do wear comfortable clothes (no dresses or overalls) and walking shoes, tennis shoes preferred (No heels or open toe shoes are allowed). . Do NOT wear cologne, perfume, aftershave, or lotions (deodorant is allowed). . If these instructions are not followed, your test will have to be rescheduled.   If insurance approves you will need to have a pregnancy test prior to the [place].  Follow-Up: At [facility], you and your health needs are our priority.  As part of our  continuing mission to provide you with exceptional heart care, we have created designated Provider Care Teams.  These Care Teams include your primary Cardiologist (physician) and Advanced Practice Providers (APPs -  Physician Assistants and Nurse Practitioners) who all work together to provide you with the care you need, when you need it.  We recommend signing up for the patient portal called "MyChart".  Sign up information is provided on this After Visit Summary.  MyChart is used to connect with patients for Virtual Visits (Telemedicine).  Patients are able to view lab/test results, encounter notes, upcoming appointments, etc.  Non-urgent messages can be sent to your provider as well.   To learn more about what you can do with MyChart, go to [email or web address].    Your next appointment:   6 month(s)  The format for your next appointment:   In Person  Provider:   Belva Crome, MD   Other Instructions NA

## 2019-12-15 ENCOUNTER — Telehealth: Payer: Self-pay

## 2019-12-15 ENCOUNTER — Telehealth (HOSPITAL_COMMUNITY): Payer: Self-pay | Admitting: Radiology

## 2019-12-15 ENCOUNTER — Encounter (HOSPITAL_COMMUNITY): Payer: Self-pay | Admitting: *Deleted

## 2019-12-15 ENCOUNTER — Telehealth (HOSPITAL_COMMUNITY): Payer: Self-pay | Admitting: *Deleted

## 2019-12-15 LAB — BASIC METABOLIC PANEL
BUN/Creatinine Ratio: 12 (ref 9–23)
BUN: 10 mg/dL (ref 6–24)
CO2: 24 mmol/L (ref 20–29)
Calcium: 9.5 mg/dL (ref 8.7–10.2)
Chloride: 105 mmol/L (ref 96–106)
Creatinine, Ser: 0.85 mg/dL (ref 0.57–1.00)
GFR calc Af Amer: 97 mL/min/{1.73_m2} (ref >59)
GFR calc non Af Amer: 84 mL/min/{1.73_m2} (ref >59)
Glucose: 90 mg/dL (ref 65–99)
Potassium: 4.3 mmol/L (ref 3.5–5.2)
Sodium: 141 mmol/L (ref 134–144)

## 2019-12-15 NOTE — Telephone Encounter (Signed)
Spoke with patient regarding results and recommendation.  Patient verbalizes understanding and is agreeable to plan of care. Advised patient to call back with any issues or concerns.  

## 2019-12-15 NOTE — Telephone Encounter (Signed)
Left message on voicemail in reference to upcoming appointment scheduled for 12/22/19. Phone number given for a call back so details instructions can be given. Daneil Dolin   Also, sent MyChart letter outlining instructions for Myoview.

## 2019-12-15 NOTE — Telephone Encounter (Signed)
Patient given detailed instructions per Myocardial Perfusion Study Information Sheet for the test on 12/22/19 at 0815. Patient notified to arrive 15 minutes early and that it is imperative to arrive on time for appointment to keep from having the test rescheduled.  If you need to cancel or reschedule your appointment, please call the office within 24 hours of your appointment. . Patient verbalized understanding.TMY

## 2019-12-15 NOTE — Telephone Encounter (Signed)
-----   Message from Garwin Brothers, MD sent at 12/15/2019  8:17 AM EDT ----- The results of the study is unremarkable. Please inform patient. I will discuss in detail at next appointment. Cc  primary care/referring physician Garwin Brothers, MD 12/15/2019 8:17 AM

## 2019-12-22 ENCOUNTER — Ambulatory Visit (INDEPENDENT_AMBULATORY_CARE_PROVIDER_SITE_OTHER): Payer: 59

## 2019-12-22 ENCOUNTER — Other Ambulatory Visit: Payer: Self-pay

## 2019-12-22 DIAGNOSIS — R0600 Dyspnea, unspecified: Secondary | ICD-10-CM | POA: Diagnosis not present

## 2019-12-22 LAB — MYOCARDIAL PERFUSION IMAGING
LV dias vol: 84 mL (ref 46–106)
LV sys vol: 26 mL
Peak HR: 94 {beats}/min
Rest HR: 53 {beats}/min
SDS: 4
SRS: 0
SSS: 4
TID: 1.03

## 2019-12-22 MED ORDER — TECHNETIUM TC 99M TETROFOSMIN IV KIT
30.2000 | PACK | Freq: Once | INTRAVENOUS | Status: AC | PRN
  Administered 2019-12-22: 30.2 via INTRAVENOUS

## 2019-12-22 MED ORDER — TECHNETIUM TC 99M TETROFOSMIN IV KIT
10.6000 | PACK | Freq: Once | INTRAVENOUS | Status: AC | PRN
  Administered 2019-12-22: 10.6 via INTRAVENOUS

## 2019-12-22 MED ORDER — REGADENOSON 0.4 MG/5ML IV SOLN
0.4000 mg | Freq: Once | INTRAVENOUS | Status: AC
  Administered 2019-12-22: 0.4 mg via INTRAVENOUS

## 2019-12-24 ENCOUNTER — Other Ambulatory Visit: Payer: Self-pay

## 2019-12-24 MED ORDER — ASPIRIN EC 81 MG PO TBEC: 81.0000 mg | DELAYED_RELEASE_TABLET | Freq: Every day | ORAL | 3 refills | Status: DC

## 2019-12-24 MED ORDER — NITROGLYCERIN 0.4 MG SL SUBL: 0.4000 mg | SUBLINGUAL_TABLET | SUBLINGUAL | 6 refills | Status: DC | PRN

## 2019-12-24 NOTE — Addendum Note (Signed)
Addended by: Eleonore Chiquito on: 12/24/2019 11:19 AM   Modules accepted: Orders

## 2019-12-25 ENCOUNTER — Ambulatory Visit (HOSPITAL_BASED_OUTPATIENT_CLINIC_OR_DEPARTMENT_OTHER)
Admission: RE | Admit: 2019-12-25 | Discharge: 2019-12-25 | Disposition: A | Discharge status: Home / Self Care | Payer: 59 | Source: Ambulatory Visit | Attending: Cardiology | Admitting: Cardiology

## 2019-12-25 ENCOUNTER — Other Ambulatory Visit: Payer: Self-pay

## 2019-12-25 ENCOUNTER — Encounter: Payer: Self-pay | Admitting: Cardiology

## 2019-12-25 ENCOUNTER — Ambulatory Visit (INDEPENDENT_AMBULATORY_CARE_PROVIDER_SITE_OTHER): Payer: 59 | Admitting: Cardiology

## 2019-12-25 VITALS — BP 120/74 | HR 60 | Ht 66.0 in | Wt 203.0 lb

## 2019-12-25 DIAGNOSIS — R9439 Abnormal result of other cardiovascular function study: Secondary | ICD-10-CM | POA: Insufficient documentation

## 2019-12-25 DIAGNOSIS — E663 Overweight: Secondary | ICD-10-CM

## 2019-12-25 DIAGNOSIS — R0600 Dyspnea, unspecified: Secondary | ICD-10-CM

## 2019-12-25 DIAGNOSIS — R0609 Other forms of dyspnea: Secondary | ICD-10-CM

## 2019-12-25 NOTE — H&P (View-Only) (Signed)
Cardiology Office Note:    Date:  12/25/2019   ID:  Angel [name], DOB June 10, 1976, MRN [id number]  PCP:  Cassandria Anger, MD  Cardiologist:  Jenne Campus, MD    Referring MD: Cassandria Anger, MD   No chief complaint on file. Abnormal test  History of Present Illness:    Angel [name] is a 44 y.o. female with no significant past medical history.  She was referred to [place] initially because of chest pain.  Her history is quite suspicious.  She said she work-ups that she will develop tightness in the chest she stopped tightness goes away.  She does not have the many risk factors.  Her cholesterol is quite decent with her LDL of 101 and HDL of 66, she does not smoke does not have hypertension does not have diabetes she does not exercise on the regular basis because of injury to the ankle that she suffered few months ago.  She does have however premature family history of coronary artery disease.  Her father apparently had some myocardial infarction before age 41.  So she comes today to my office to talk about options for her situations she still described to have some exertional chest pain while walking upstairs.  We will talk about medical therapy versus cardiac catheterization.  She definitely clearly prefer cardiac catheterization.  I did describe procedure to her including all risk benefits as well as alternatives and she is willing to proceed.  In the meantime she does have already aspirin as well as nitroglycerin that she need to take on as-needed basis.  Past Medical History:  Diagnosis Date  . Atypical squamous cells of undetermined significance (ASC-US) on cervical Pap smear 10/30/2019  . Bloating symptom 04/30/2012   9/13 2 hrs long cramping episodes associated w/diarrhea x 3 episodes R/o a viral illness, GS, pancreatitis and other dx's 9/18 gluten free diet  . Breast lump 06/08/2013  . Breast mass 06/2013   bilateral  . Cardiac murmur 09/04/2019  . Cyst of right ovary  10/30/2019  . 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 DOE (dyspnea on exertion) 06/10/2019  . GERD (gastroesophageal reflux disease) 08/2014   takes Protonix daily  . Herniated disc   . History of anemia 10/30/2019  . History of dysmenorrhea 10/30/2019  . History of hematuria 10/30/2019  . Insomnia 10/07/2013   Chronic   . Irritant dermatitis 11/17/2014  . Migraine headache   . [place] 10/30/2019  . Overweight 09/04/2019  . Sickle cell trait ([place])   . Vitamin D deficiency 2009  . Vitamin D deficiency 09/03/2007   Chronic 2/14 Risks associated with treatment noncompliance were discussed. Compliance was encouraged.      Past Surgical History:  Procedure Laterality Date  . BILATERAL SALPINGECTOMY  09/26/2015   Procedure: BILATERAL SALPINGECTOMY;  Surgeon: Everett Graff, MD;  Location: [facility];  Service: Gynecology;;  . BREAST BIOPSY Bilateral 07/13/2013   Procedure: BILATERAL EXCISION BREAS MASSES;  Surgeon: Harl Bowie, MD;  Location: [place];  Service: General;  Laterality: Bilateral;  . BREAST CYST EXCISION Left 2014   4 cysts removed  . DE QUERVAIN'S RELEASE Right 09/06/2003  . DILITATION & CURRETTAGE/HYSTROSCOPY WITH NOVASURE ABLATION N/A 09/26/2015   Procedure: DILATATION & CURETTAGE/HYSTEROSCOPY WITH NOVASURE ABLATION;  Surgeon: Everett Graff, MD;  Location: [facility];  Service: Gynecology;  Laterality: N/A;  . LAPAROSCOPIC OVARIAN CYSTECTOMY Right 09/26/2015   Procedure: LAPAROSCOPIC Right OVARIAN CYSTECTOMY, Bilateral Peritoneal  Biopsies over Uteralsacral area;  Surgeon: Osborn Coho, MD;  Location: [facility];  Service: Gynecology;  Laterality: Right;  . SPINAL FUSION  07/05/2018  . TUBAL LIGATION  12/27/2005  . WISDOM TOOTH EXTRACTION    . WRIST GANGLION EXCISION Right 09/06/2003  . WRIST GANGLION EXCISION Left 09/04/1999    Current Medications: Current Meds  Medication Sig  . acetaminophen (TYLENOL) 500 MG  tablet Take 500 mg by mouth every 6 (six) hours as needed.  Marland Kitchen albuterol (PROVENTIL HFA;VENTOLIN HFA) 108 (90 Base) MCG/ACT inhaler Inhale 2 puffs into the lungs every 6 (six) hours as needed for wheezing or shortness of breath.  Marland Kitchen aspirin EC 81 MG tablet Take 1 tablet (81 mg total) by mouth daily.  . betamethasone dipropionate 0.05 % cream Apply topically 2 (two) times daily.  . Cholecalciferol (VITAMIN D3) 2000 units capsule Take 1 capsule (2,000 Units total) by mouth daily.  . clindamycin (CLINDAGEL) 1 % gel Apply 1 application topically daily.   . fluticasone (FLONASE) 50 MCG/ACT nasal spray Place 2 sprays into both nostrils daily.  . hydrocortisone (ANUSOL-HC) 2.5 % rectal cream Use bid  . hydrocortisone (ANUSOL-HC) 25 MG suppository Place 1 suppository (25 mg total) rectally 2 (two) times daily as needed for hemorrhoids or itching.  Marland Kitchen ibuprofen (ADVIL,MOTRIN) 800 MG tablet Take 1 tablet (800 mg total) by mouth every 8 (eight) hours as needed.  Marland Kitchen levocetirizine (XYZAL) 5 MG tablet Take 1 tablet (5 mg total) by mouth every evening.  . nitroGLYCERIN (NITROSTAT) 0.4 MG SL tablet Place 1 tablet (0.4 mg total) under the tongue every 5 (five) minutes as needed for chest pain.  Marland Kitchen spironolactone (ALDACTONE) 50 MG tablet   . SUMAtriptan (IMITREX) 100 MG tablet Take 1 tablet (100 mg total) by mouth every 2 (two) hours as needed for migraine. May repeat in 2 hours if headache persists or recurs.  Marland Kitchen tiZANidine (ZANAFLEX) 4 MG tablet Take 4 mg by mouth as needed for muscle spasms.  Marland Kitchen tretinoin (RETIN-A) 0.025 % cream Apply topically.  . triamcinolone cream (KENALOG) 0.1 % triamcinolone acetonide 0.1 % topical cream     Allergies:   Other and Zolpidem   Social History   Socioeconomic History  . Marital status: Married    Spouse name: Not on file  . Number of children: 2  . Years of education: Not on file  . Highest education level: Not on file  Occupational History  . Not on file  Tobacco Use    . Smoking status: Never Smoker  . Smokeless tobacco: Never Used  Substance and Sexual Activity  . Alcohol use: No  . Drug use: No  . Sexual activity: Not on file    Comment: BTL  Other Topics Concern  . Not on file  Social History Narrative  . Not on file   Social Determinants of Health   Financial Resource Strain:   . Difficulty of Paying Living Expenses:   Food Insecurity:   . Worried About Programme researcher, broadcasting/film/video in the Last Year:   . Barista in the Last Year:   Transportation Needs:   . Freight forwarder (Medical):   Marland Kitchen Lack of Transportation (Non-Medical):   Physical Activity:   . Days of Exercise per Week:   . Minutes of Exercise per Session:   Stress:   . Feeling of Stress :   Social Connections:   . Frequency of Communication with Friends and Family:   . Frequency of Social  Gatherings with Friends and Family:   . Attends Religious Services:   . Active Member of Clubs or Organizations:   . Attends Banker Meetings:   Marland Kitchen Marital Status:      Family History: The patient's family history includes Cancer in her father; Diabetes in her father; Heart disease in her father; Hypertension in her father; Sickle cell anemia in her daughter and son. ROS:   Please see the history of present illness.    All 14 point review of systems negative except as described per history of present illness  EKGs/Labs/Other Studies Reviewed:      Recent Labs: 06/08/2019: TSH 2.80 07/06/2019: ALT 15; Hemoglobin 13.4; Magnesium 2.1; Platelets 279 12/14/2019: BUN 10; Creatinine, Ser 0.85; Potassium 4.3; Sodium 141  Recent Lipid Panel    Component Value Date/Time   CHOL 181 06/08/2019 0928   TRIG 70.0 06/08/2019 0928   HDL 66.40 06/08/2019 0928   CHOLHDL 3 06/08/2019 0928   VLDL 14.0 06/08/2019 0928   LDLCALC 101 (H) 06/08/2019 0928    Physical Exam:    VS:  BP 120/74   Pulse 60   Ht 5\' 6"  (1.676 m)   Wt 203 lb (92.1 kg)   SpO2 100%   BMI 32.77 kg/m      Wt Readings from Last 3 Encounters:  12/25/19 203 lb (92.1 kg)  12/22/19 202 lb (91.6 kg)  12/14/19 202 lb (91.6 kg)     GEN:  Well nourished, well developed in no acute distress HEENT: Normal NECK: No JVD; No carotid bruits LYMPHATICS: No lymphadenopathy CARDIAC: RRR, no murmurs, no rubs, no gallops RESPIRATORY:  Clear to auscultation without rales, wheezing or rhonchi  ABDOMEN: Soft, non-tender, non-distended MUSCULOSKELETAL:  No edema; No deformity  SKIN: Warm and dry LOWER EXTREMITIES: no swelling NEUROLOGIC:  Alert and oriented x 3 PSYCHIATRIC:  Normal affect   ASSESSMENT:    1. DOE (dyspnea on exertion)   2. Abnormal stress test   3. Overweight    PLAN:    In order of problems listed above:  1. Abnormal stress test showing ischemia involving mid anteroseptal location.  02/13/20 is quite interesting because she does not have too many risk factors except for family history of premature coronary artery disease but at the same time chest pain that she is experiencing look at least suspicious.  She prefers a cardiac catheterization which we will do. 2. Overweight we did talk about need to exercise and good diet and we will continue this discussion after clarification for potential coronary artery disease will be done. 3. Dyslipidemia with her LDL of 101 will initiate statin therapy after cardiac catheterization clarified about coronary artery disease.   Medication Adjustments/Labs and Tests Ordered: Current medicines are reviewed at length with the patient today.  Concerns regarding medicines are outlined above.  No orders of the defined types were placed in this encounter.  Medication changes: No orders of the defined types were placed in this encounter.   Signed, Shane Crutch, MD, [facility] 12/25/2019 10:05 AM    [facility]

## 2019-12-25 NOTE — Patient Instructions (Signed)
Medication Instructions:  Your physician recommends that you continue on your current medications as directed. Please refer to the Current Medication list given to you today.  *If you need a refill on your cardiac medications before your next appointment, please call your pharmacy*   Lab Work: Your physician recommends that you return for lab work today:  cbc   If you have labs (blood work) drawn today and your tests are completely normal, you will receive your results only by: Marland Kitchen MyChart Message (if you have MyChart) OR . A paper copy in the mail If you have any lab test that is abnormal or we need to change your treatment, we will call you to review the results.   Testing/Procedures: A chest x-ray takes a picture of the organs and structures inside the chest, including the heart, lungs, and blood vessels. This test can show several things, including, whether the heart is enlarges; whether fluid is building up in the lungs; and whether pacemaker / defibrillator leads are still in place.     [facility] [address] [place], [place] [address] Dept: [phone] Loc: [phone]  Angel [name]  12/25/2019  You are scheduled for a Cardiac Catheterization on Wednesday, May 19 with Dr. Glenetta Hew.  1. Please arrive at the [facility] (Main Entrance A) at [facility]: [address] [place], [place] [address] at 6:30 AM (This time is two hours before your procedure to ensure your preparation). Free valet parking service is available.   Special note: Every effort is made to have your procedure done on time. Please understand that emergencies sometimes delay scheduled procedures.  2. Diet: Do not eat solid foods after midnight.  The patient may have clear liquids until 5am upon the day of the procedure.  3. Labs: You will have labs drawn today.  4. Medication instructions in  preparation for your procedure:    Hold spironolactone to day of the procedure.   On the morning of your procedure, take your Aspirin and any morning medicines NOT listed above.  You may use sips of water.  5. Plan for one night stay--bring personal belongings. 6. Bring a current list of your medications and current insurance cards. 7. You MUST have a responsible person to drive you home. 8. Someone MUST be with you the first 24 hours after you arrive home or your discharge will be delayed. 9. Please wear clothes that are easy to get on and off and wear slip-on shoes.  Thank you for allowing [place] to care for you!   -- [facility]    Follow-Up: At [facility], you and your health needs are our priority.  As part of our continuing mission to provide you with exceptional heart care, we have created designated Provider Care Teams.  These Care Teams include your primary Cardiologist (physician) and Advanced Practice Providers (APPs -  Physician Assistants and Nurse Practitioners) who all work together to provide you with the care you need, when you need it.  We recommend signing up for the patient portal called "MyChart".  Sign up information is provided on this After Visit Summary.  MyChart is used to connect with patients for Virtual Visits (Telemedicine).  Patients are able to view lab/test results, encounter notes, upcoming appointments, etc.  Non-urgent messages can be sent to your provider as well.   To learn more about what you can do with  MyChart, go to [email or web address].    Your next appointment:   1 month(s)  The format for your next appointment:   In Person  Provider:   Belva Crome, MD   Other Instructions   Coronary Angiogram With Stent Coronary angiogram with stent placement is a procedure to widen or open a narrow blood vessel of the heart (coronary artery). Arteries may become blocked by cholesterol buildup (plaques) in the  lining of the artery wall. When a coronary artery becomes partially blocked, blood flow to that area decreases. This may lead to chest pain or a heart attack (myocardial infarction). A stent is a small piece of metal that looks like mesh or spring. Stent placement may be done as treatment after a heart attack, or to prevent a heart attack if a blocked artery is found by a coronary angiogram. Let your health care provider know about:  Any allergies you have, including allergies to medicines or contrast dye.  All medicines you are taking, including vitamins, herbs, eye drops, creams, and over-the-counter medicines.  Any problems you or family members have had with anesthetic medicines.  Any blood disorders you have.  Any surgeries you have had.  Any medical conditions you have, including kidney problems or kidney failure.  Whether you are pregnant or may be pregnant.  Whether you are breastfeeding. What are the risks? Generally, this is a safe procedure. However, serious problems may occur, including:  Damage to nearby structures or organs, such as the heart, blood vessels, or kidneys.  A return of blockage.  Bleeding, infection, or bruising at the insertion site.  A collection of blood under the skin (hematoma) at the insertion site.  A blood clot in another part of the body.  Allergic reaction to medicines or dyes.  Bleeding into the abdomen (retroperitoneal bleeding).  Stroke (rare).  Heart attack (rare). What happens before the procedure? Staying hydrated Follow instructions from your health care provider about hydration, which may include:  Up to 2 hours before the procedure - you may continue to drink clear liquids, such as water, clear fruit juice, black coffee, and plain tea.  Eating and drinking restrictions Follow instructions from your health care provider about eating and drinking, which may include:  8 hours before the procedure - stop eating heavy meals or  foods, such as meat, fried foods, or fatty foods.  6 hours before the procedure - stop eating light meals or foods, such as toast or cereal.  2 hours before the procedure - stop drinking clear liquids. Medicines Ask your health care provider about:  Changing or stopping your regular medicines. This is especially important if you are taking diabetes medicines or blood thinners.  Taking medicines such as aspirin and ibuprofen. These medicines can thin your blood. Do not take these medicines unless your health care provider tells you to take them. ? Generally, aspirin is recommended before a thin tube, called a catheter, is passed through a blood vessel and inserted into the heart (cardiac catheterization).  Taking over-the-counter medicines, vitamins, herbs, and supplements. General instructions  Do not use any products that contain nicotine or tobacco for at least 4 weeks before the procedure. These products include cigarettes, e-cigarettes, and chewing tobacco. If you need help quitting, ask your health care provider.  Plan to have someone take you home from the hospital or clinic.  If you will be going home right after the procedure, plan to have someone with you for 24 hours.  You may have tests  and imaging procedures.  Ask your health care provider: ? How your insertion site will be marked. Ask which artery will be used for the procedure. ? What steps will be taken to help prevent infection. These may include:  Removing hair at the insertion site.  Washing skin with a germ-killing soap.  Taking antibiotic medicine. What happens during the procedure?   An IV will be inserted into one of your veins.  Electrodes may be placed on your chest to monitor your heart rate during the procedure.  You will be given one or more of the following: ? A medicine to help you relax (sedative). ? A medicine to numb the area (local anesthetic) for catheter insertion.  A small incision will  be made for catheter insertion.  The catheter will be inserted into an artery using a guide wire. The location may be in your groin, your wrist, or the fold of your arm (near your elbow).  An X-ray procedure (fluoroscopy) will be used to help guide the catheter to the opening of the heart arteries.  A dye will be injected into the catheter. X-rays will be taken. The dye helps to show where any narrowing or blockages are located in the arteries.  Tell your health care provider if you have chest pain or trouble breathing.  A tiny wire will be guided to the blocked spot, and a balloon will be inflated to make the artery wider.  The stent will be expanded to crush the plaques into the wall of the vessel. The stent will hold the area open and improve the blood flow. Most stents have a drug coating to reduce the risk of the stent narrowing over time.  The artery may be made wider using a drill, laser, or other tools that remove plaques.  The catheter will be removed when the blood flow improves. The stent will stay where it was placed, and the lining of the artery will grow over it.  A bandage (dressing) will be placed on the insertion site. Pressure will be applied to stop bleeding.  The IV will be removed. This procedure may vary among health care providers and hospitals. What happens after the procedure?  Your blood pressure, heart rate, breathing rate, and blood oxygen level will be monitored until you leave the hospital or clinic.  If the procedure is done through the leg, you will lie flat in bed for a few hours or for as long as told by your health care provider. You will be instructed not to bend or cross your legs.  The insertion site and the pulse in your foot or wrist will be checked often.  You may have more blood tests, X-rays, and a test that records the electrical activity of your heart (electrocardiogram, or ECG).  Do not drive for 24 hours if you were given a sedative  during your procedure. Summary  Coronary angiogram with stent placement is a procedure to widen or open a narrowed coronary artery. This is done to treat heart problems.  Before the procedure, let your health care provider know about all the medical conditions and surgeries you have or have had.  This is a safe procedure. However, some problems may occur, including damage to nearby structures or organs, bleeding, blood clots, or allergies.  Follow your health care provider's instructions about eating, drinking, medicines, and other lifestyle changes, such as quitting tobacco use before the procedure. This information is not intended to replace advice given to you by your  health care provider. Make sure you discuss any questions you have with your health care provider. Document Revised: 02/18/2019 Document Reviewed: 02/18/2019 Elsevier Patient Education  2020 [employer].

## 2019-12-25 NOTE — Progress Notes (Signed)
Cardiology Office Note:    Date:  12/25/2019   ID:  Angel [name], DOB June 10, 1976, MRN [id number]  PCP:  Cassandria Anger, MD  Cardiologist:  Jenne Campus, MD    Referring MD: Cassandria Anger, MD   No chief complaint on file. Abnormal test  History of Present Illness:    Angel [name] is a 44 y.o. female with no significant past medical history.  She was referred to [place] initially because of chest pain.  Her history is quite suspicious.  She said she work-ups that she will develop tightness in the chest she stopped tightness goes away.  She does not have the many risk factors.  Her cholesterol is quite decent with her LDL of 101 and HDL of 66, she does not smoke does not have hypertension does not have diabetes she does not exercise on the regular basis because of injury to the ankle that she suffered few months ago.  She does have however premature family history of coronary artery disease.  Her father apparently had some myocardial infarction before age 41.  So she comes today to my office to talk about options for her situations she still described to have some exertional chest pain while walking upstairs.  We will talk about medical therapy versus cardiac catheterization.  She definitely clearly prefer cardiac catheterization.  I did describe procedure to her including all risk benefits as well as alternatives and she is willing to proceed.  In the meantime she does have already aspirin as well as nitroglycerin that she need to take on as-needed basis.  Past Medical History:  Diagnosis Date  . Atypical squamous cells of undetermined significance (ASC-US) on cervical Pap smear 10/30/2019  . Bloating symptom 04/30/2012   9/13 2 hrs long cramping episodes associated w/diarrhea x 3 episodes R/o a viral illness, GS, pancreatitis and other dx's 9/18 gluten free diet  . Breast lump 06/08/2013  . Breast mass 06/2013   bilateral  . Cardiac murmur 09/04/2019  . Cyst of right ovary  10/30/2019  . 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 DOE (dyspnea on exertion) 06/10/2019  . GERD (gastroesophageal reflux disease) 08/2014   takes Protonix daily  . Herniated disc   . History of anemia 10/30/2019  . History of dysmenorrhea 10/30/2019  . History of hematuria 10/30/2019  . Insomnia 10/07/2013   Chronic   . Irritant dermatitis 11/17/2014  . Migraine headache   . [place] 10/30/2019  . Overweight 09/04/2019  . Sickle cell trait ([place])   . Vitamin D deficiency 2009  . Vitamin D deficiency 09/03/2007   Chronic 2/14 Risks associated with treatment noncompliance were discussed. Compliance was encouraged.      Past Surgical History:  Procedure Laterality Date  . BILATERAL SALPINGECTOMY  09/26/2015   Procedure: BILATERAL SALPINGECTOMY;  Surgeon: Everett Graff, MD;  Location: [facility];  Service: Gynecology;;  . BREAST BIOPSY Bilateral 07/13/2013   Procedure: BILATERAL EXCISION BREAS MASSES;  Surgeon: Harl Bowie, MD;  Location: [facility];  Service: General;  Laterality: Bilateral;  . BREAST CYST EXCISION Left 2014   4 cysts removed  . DE QUERVAIN'S RELEASE Right 09/06/2003  . DILITATION & CURRETTAGE/HYSTROSCOPY WITH NOVASURE ABLATION N/A 09/26/2015   Procedure: DILATATION & CURETTAGE/HYSTEROSCOPY WITH NOVASURE ABLATION;  Surgeon: Everett Graff, MD;  Location: [facility];  Service: Gynecology;  Laterality: N/A;  . LAPAROSCOPIC OVARIAN CYSTECTOMY Right 09/26/2015   Procedure: LAPAROSCOPIC Right OVARIAN CYSTECTOMY, Bilateral Peritoneal  Biopsies over Uteralsacral area;  Surgeon: Angela Roberts, MD;  Location: [facility];  Service: Gynecology;  Laterality: Right;  . SPINAL FUSION  07/05/2018  . TUBAL LIGATION  12/27/2005  . WISDOM TOOTH EXTRACTION    . WRIST GANGLION EXCISION Right 09/06/2003  . WRIST GANGLION EXCISION Left 09/04/1999    Current Medications: Current Meds  Medication Sig  . acetaminophen (TYLENOL) 500 MG  tablet Take 500 mg by mouth every 6 (six) hours as needed.  . albuterol (PROVENTIL HFA;VENTOLIN HFA) 108 (90 Base) MCG/ACT inhaler Inhale 2 puffs into the lungs every 6 (six) hours as needed for wheezing or shortness of breath.  . aspirin EC 81 MG tablet Take 1 tablet (81 mg total) by mouth daily.  . betamethasone dipropionate 0.05 % cream Apply topically 2 (two) times daily.  . Cholecalciferol (VITAMIN D3) 2000 units capsule Take 1 capsule (2,000 Units total) by mouth daily.  . clindamycin (CLINDAGEL) 1 % gel Apply 1 application topically daily.   . fluticasone (FLONASE) 50 MCG/ACT nasal spray Place 2 sprays into both nostrils daily.  . hydrocortisone (ANUSOL-HC) 2.5 % rectal cream Use bid  . hydrocortisone (ANUSOL-HC) 25 MG suppository Place 1 suppository (25 mg total) rectally 2 (two) times daily as needed for hemorrhoids or itching.  . ibuprofen (ADVIL,MOTRIN) 800 MG tablet Take 1 tablet (800 mg total) by mouth every 8 (eight) hours as needed.  . levocetirizine (XYZAL) 5 MG tablet Take 1 tablet (5 mg total) by mouth every evening.  . nitroGLYCERIN (NITROSTAT) 0.4 MG SL tablet Place 1 tablet (0.4 mg total) under the tongue every 5 (five) minutes as needed for chest pain.  . spironolactone (ALDACTONE) 50 MG tablet   . SUMAtriptan (IMITREX) 100 MG tablet Take 1 tablet (100 mg total) by mouth every 2 (two) hours as needed for migraine. May repeat in 2 hours if headache persists or recurs.  . tiZANidine (ZANAFLEX) 4 MG tablet Take 4 mg by mouth as needed for muscle spasms.  . tretinoin (RETIN-A) 0.025 % cream Apply topically.  . triamcinolone cream (KENALOG) 0.1 % triamcinolone acetonide 0.1 % topical cream     Allergies:   Other and Zolpidem   Social History   Socioeconomic History  . Marital status: Married    Spouse name: Not on file  . Number of children: 2  . Years of education: Not on file  . Highest education level: Not on file  Occupational History  . Not on file  Tobacco Use    . Smoking status: Never Smoker  . Smokeless tobacco: Never Used  Substance and Sexual Activity  . Alcohol use: No  . Drug use: No  . Sexual activity: Not on file    Comment: BTL  Other Topics Concern  . Not on file  Social History Narrative  . Not on file   Social Determinants of Health   Financial Resource Strain:   . Difficulty of Paying Living Expenses:   Food Insecurity:   . Worried About Running Out of Food in the Last Year:   . Ran Out of Food in the Last Year:   Transportation Needs:   . Lack of Transportation (Medical):   . Lack of Transportation (Non-Medical):   Physical Activity:   . Days of Exercise per Week:   . Minutes of Exercise per Session:   Stress:   . Feeling of Stress :   Social Connections:   . Frequency of Communication with Friends and Family:   . Frequency of Social   Gatherings with Friends and Family:   . Attends Religious Services:   . Active Member of Clubs or Organizations:   . Attends Banker Meetings:   Marland Kitchen Marital Status:      Family History: The patient's family history includes Cancer in her father; Diabetes in her father; Heart disease in her father; Hypertension in her father; Sickle cell anemia in her daughter and son. ROS:   Please see the history of present illness.    All 14 point review of systems negative except as described per history of present illness  EKGs/Labs/Other Studies Reviewed:      Recent Labs: 06/08/2019: TSH 2.80 07/06/2019: ALT 15; Hemoglobin 13.4; Magnesium 2.1; Platelets 279 12/14/2019: BUN 10; Creatinine, Ser 0.85; Potassium 4.3; Sodium 141  Recent Lipid Panel    Component Value Date/Time   CHOL 181 06/08/2019 0928   TRIG 70.0 06/08/2019 0928   HDL 66.40 06/08/2019 0928   CHOLHDL 3 06/08/2019 0928   VLDL 14.0 06/08/2019 0928   LDLCALC 101 (H) 06/08/2019 0928    Physical Exam:    VS:  BP 120/74   Pulse 60   Ht 5\' 6"  (1.676 m)   Wt 203 lb (92.1 kg)   SpO2 100%   BMI 32.77 kg/m      Wt Readings from Last 3 Encounters:  12/25/19 203 lb (92.1 kg)  12/22/19 202 lb (91.6 kg)  12/14/19 202 lb (91.6 kg)     GEN:  Well nourished, well developed in no acute distress HEENT: Normal NECK: No JVD; No carotid bruits LYMPHATICS: No lymphadenopathy CARDIAC: RRR, no murmurs, no rubs, no gallops RESPIRATORY:  Clear to auscultation without rales, wheezing or rhonchi  ABDOMEN: Soft, non-tender, non-distended MUSCULOSKELETAL:  No edema; No deformity  SKIN: Warm and dry LOWER EXTREMITIES: no swelling NEUROLOGIC:  Alert and oriented x 3 PSYCHIATRIC:  Normal affect   ASSESSMENT:    1. DOE (dyspnea on exertion)   2. Abnormal stress test   3. Overweight    PLAN:    In order of problems listed above:  1. Abnormal stress test showing ischemia involving mid anteroseptal location.  02/13/20 is quite interesting because she does not have too many risk factors except for family history of premature coronary artery disease but at the same time chest pain that she is experiencing look at least suspicious.  She prefers a cardiac catheterization which we will do. 2. Overweight we did talk about need to exercise and good diet and we will continue this discussion after clarification for potential coronary artery disease will be done. 3. Dyslipidemia with her LDL of 101 will initiate statin therapy after cardiac catheterization clarified about coronary artery disease.   Medication Adjustments/Labs and Tests Ordered: Current medicines are reviewed at length with the patient today.  Concerns regarding medicines are outlined above.  No orders of the defined types were placed in this encounter.  Medication changes: No orders of the defined types were placed in this encounter.   Signed, Shane Crutch, MD, [facility] 12/25/2019 10:05 AM    [facility]

## 2019-12-26 ENCOUNTER — Other Ambulatory Visit (HOSPITAL_COMMUNITY)
Admission: RE | Admit: 2019-12-26 | Discharge: 2019-12-26 | Disposition: A | Discharge status: Home / Self Care | Payer: 59 | Source: Ambulatory Visit | Attending: Cardiology | Admitting: Cardiology

## 2019-12-26 DIAGNOSIS — Z01812 Encounter for preprocedural laboratory examination: Secondary | ICD-10-CM | POA: Insufficient documentation

## 2019-12-26 DIAGNOSIS — Z20822 Contact with and (suspected) exposure to covid-19: Secondary | ICD-10-CM | POA: Insufficient documentation

## 2019-12-26 LAB — CBC
Hematocrit: 40.3 % (ref 34.0–46.6)
Hemoglobin: 13.2 g/dL (ref 11.1–15.9)
MCH: 30.1 pg (ref 26.6–33.0)
MCHC: 32.8 g/dL (ref 31.5–35.7)
MCV: 92 fL (ref 79–97)
Platelets: 299 10*3/uL (ref 150–450)
RBC: 4.38 x10E6/uL (ref 3.77–5.28)
RDW: 12.3 % (ref 11.7–15.4)
WBC: 7.2 10*3/uL (ref 3.4–10.8)

## 2019-12-26 LAB — SARS CORONAVIRUS 2 (TAT 6-24 HRS): SARS Coronavirus 2: NEGATIVE

## 2019-12-29 ENCOUNTER — Telehealth: Payer: Self-pay | Admitting: *Deleted

## 2019-12-29 NOTE — Telephone Encounter (Signed)
Pt contacted pre-catheterization scheduled at [facility] for: Wednesday Dec 30, 2019 8:30 AM Verified arrival time and place: [facility] Main Entrance A [facility]) at: 6:30 AM   No solid food after midnight prior to cath, clear liquids until 5 AM day of procedure.  Hold: Spironolactone-AM of procedure  Except hold medications AM meds can be  taken pre-cath with sip of water including: ASA 81 mg   Confirmed patient has responsible adult to drive home post procedure and observe 24 hours after arriving home: yes  You are allowed ONE visitor in the waiting room during your procedure. Both you and your visitor must wear masks.      COVID-19 Pre-Screening Questions:  . In the past 7 to 10 days have you had a cough,  shortness of breath, headache, congestion, fever (100 or greater) body aches, chills, sore throat, or sudden loss of taste or sense of smell? no . Have you been around anyone with known Covid 19 in the past 7 to 10 days? no . Have you been around anyone who is awaiting Covid 19 test results in the past 7 to 10 days? no . Have you been around anyone who has mentioned symptoms of Covid 19 within the past 7 to 10 days? no  Reviewed procedure/mask/visitor instructions, COVID-19 screening questions with patient

## 2019-12-30 ENCOUNTER — Encounter (HOSPITAL_COMMUNITY)
Admission: RE | Disposition: A | Discharge status: Home / Self Care | Payer: Self-pay | Source: Home / Self Care | Attending: Cardiology

## 2019-12-30 ENCOUNTER — Ambulatory Visit (HOSPITAL_COMMUNITY)
Admission: RE | Admit: 2019-12-30 | Discharge: 2019-12-30 | Disposition: A | Discharge status: Home / Self Care | Payer: 59 | Attending: Cardiology | Admitting: Cardiology

## 2019-12-30 ENCOUNTER — Other Ambulatory Visit: Payer: Self-pay

## 2019-12-30 DIAGNOSIS — R0609 Other forms of dyspnea: Secondary | ICD-10-CM | POA: Diagnosis present

## 2019-12-30 DIAGNOSIS — Z8249 Family history of ischemic heart disease and other diseases of the circulatory system: Secondary | ICD-10-CM | POA: Diagnosis not present

## 2019-12-30 DIAGNOSIS — D573 Sickle-cell trait: Secondary | ICD-10-CM | POA: Diagnosis not present

## 2019-12-30 DIAGNOSIS — E785 Hyperlipidemia, unspecified: Secondary | ICD-10-CM | POA: Diagnosis not present

## 2019-12-30 DIAGNOSIS — Z6832 Body mass index (BMI) 32.0-32.9, adult: Secondary | ICD-10-CM | POA: Diagnosis not present

## 2019-12-30 DIAGNOSIS — E663 Overweight: Secondary | ICD-10-CM | POA: Insufficient documentation

## 2019-12-30 DIAGNOSIS — R079 Chest pain, unspecified: Secondary | ICD-10-CM | POA: Insufficient documentation

## 2019-12-30 DIAGNOSIS — Z79899 Other long term (current) drug therapy: Secondary | ICD-10-CM | POA: Diagnosis not present

## 2019-12-30 DIAGNOSIS — Z7982 Long term (current) use of aspirin: Secondary | ICD-10-CM | POA: Insufficient documentation

## 2019-12-30 DIAGNOSIS — K219 Gastro-esophageal reflux disease without esophagitis: Secondary | ICD-10-CM | POA: Insufficient documentation

## 2019-12-30 DIAGNOSIS — R9439 Abnormal result of other cardiovascular function study: Secondary | ICD-10-CM | POA: Diagnosis present

## 2019-12-30 DIAGNOSIS — R0600 Dyspnea, unspecified: Secondary | ICD-10-CM | POA: Diagnosis present

## 2019-12-30 HISTORY — PX: LEFT HEART CATH AND CORONARY ANGIOGRAPHY: CATH118249

## 2019-12-30 SURGERY — LEFT HEART CATH AND CORONARY ANGIOGRAPHY
Anesthesia: LOCAL

## 2019-12-30 MED ORDER — SODIUM CHLORIDE 0.9% FLUSH: 3.0000 mL | INTRAVENOUS | Status: DC | PRN

## 2019-12-30 MED ORDER — SODIUM CHLORIDE 0.9 % IV SOLN: 250.0000 mL | INTRAVENOUS | Status: DC | PRN

## 2019-12-30 MED ORDER — MIDAZOLAM HCL 2 MG/2ML IJ SOLN
INTRAMUSCULAR | Status: AC
  Filled 2019-12-30: qty 2

## 2019-12-30 MED ORDER — SODIUM CHLORIDE 0.9 % WEIGHT BASED INFUSION: 1.0000 mL/kg/h | INTRAVENOUS | Status: DC

## 2019-12-30 MED ORDER — LABETALOL HCL 5 MG/ML IV SOLN: 10.0000 mg | INTRAVENOUS | Status: DC | PRN

## 2019-12-30 MED ORDER — FENTANYL CITRATE (PF) 100 MCG/2ML IJ SOLN
INTRAMUSCULAR | Status: DC | PRN
  Administered 2019-12-30: 25 ug via INTRAVENOUS

## 2019-12-30 MED ORDER — FENTANYL CITRATE (PF) 100 MCG/2ML IJ SOLN
INTRAMUSCULAR | Status: AC
  Filled 2019-12-30: qty 2

## 2019-12-30 MED ORDER — ASPIRIN 81 MG PO CHEW: 81.0000 mg | CHEWABLE_TABLET | ORAL | Status: DC

## 2019-12-30 MED ORDER — ONDANSETRON HCL 4 MG/2ML IJ SOLN: 4.0000 mg | Freq: Four times a day (QID) | INTRAMUSCULAR | Status: DC | PRN

## 2019-12-30 MED ORDER — SODIUM CHLORIDE 0.9 % WEIGHT BASED INFUSION
3.0000 mL/kg/h | INTRAVENOUS | Status: AC
  Administered 2019-12-30: 3 mL/kg/h via INTRAVENOUS

## 2019-12-30 MED ORDER — HEPARIN (PORCINE) IN NACL 1000-0.9 UT/500ML-% IV SOLN
INTRAVENOUS | Status: DC | PRN
  Administered 2019-12-30 (×2): 500 mL

## 2019-12-30 MED ORDER — LIDOCAINE HCL (PF) 1 % IJ SOLN
INTRAMUSCULAR | Status: DC | PRN
  Administered 2019-12-30: 3 mL

## 2019-12-30 MED ORDER — SODIUM CHLORIDE 0.9% FLUSH: 3.0000 mL | Freq: Two times a day (BID) | INTRAVENOUS | Status: DC

## 2019-12-30 MED ORDER — LIDOCAINE HCL (PF) 1 % IJ SOLN
INTRAMUSCULAR | Status: AC
  Filled 2019-12-30: qty 30

## 2019-12-30 MED ORDER — HEPARIN (PORCINE) IN NACL 1000-0.9 UT/500ML-% IV SOLN
INTRAVENOUS | Status: AC
  Filled 2019-12-30: qty 1000

## 2019-12-30 MED ORDER — VERAPAMIL HCL 2.5 MG/ML IV SOLN
INTRAVENOUS | Status: AC
  Filled 2019-12-30: qty 2

## 2019-12-30 MED ORDER — HYDRALAZINE HCL 20 MG/ML IJ SOLN: 10.0000 mg | INTRAMUSCULAR | Status: DC | PRN

## 2019-12-30 MED ORDER — VERAPAMIL HCL 2.5 MG/ML IV SOLN
INTRAVENOUS | Status: DC | PRN
  Administered 2019-12-30: 10 mL via INTRA_ARTERIAL

## 2019-12-30 MED ORDER — HEPARIN SODIUM (PORCINE) 1000 UNIT/ML IJ SOLN
INTRAMUSCULAR | Status: DC | PRN
  Administered 2019-12-30: 4500 [IU] via INTRAVENOUS

## 2019-12-30 MED ORDER — IOHEXOL 350 MG/ML SOLN
INTRAVENOUS | Status: DC | PRN
  Administered 2019-12-30: 45 mL

## 2019-12-30 MED ORDER — ACETAMINOPHEN 325 MG PO TABS: 650.0000 mg | ORAL_TABLET | ORAL | Status: DC | PRN

## 2019-12-30 MED ORDER — HEPARIN SODIUM (PORCINE) 1000 UNIT/ML IJ SOLN
INTRAMUSCULAR | Status: AC
  Filled 2019-12-30: qty 1

## 2019-12-30 MED ORDER — MIDAZOLAM HCL 2 MG/2ML IJ SOLN
INTRAMUSCULAR | Status: DC | PRN
  Administered 2019-12-30: 1 mg via INTRAVENOUS

## 2019-12-30 SURGICAL SUPPLY — 11 items
CATH INFINITI 5FR ANG PIGTAIL (CATHETERS) ×1 IMPLANT
CATH OPTITORQUE TIG 4.0 5F (CATHETERS) ×1 IMPLANT
DEVICE RAD COMP TR BAND LRG (VASCULAR PRODUCTS) ×1 IMPLANT
GLIDESHEATH SLEND SS 6F .021 (SHEATH) ×1 IMPLANT
GUIDEWIRE INQWIRE 1.5J.035X260 (WIRE) IMPLANT
INQWIRE 1.5J .035X260CM (WIRE) ×2
KIT HEART LEFT (KITS) ×2 IMPLANT
PACK CARDIAC CATHETERIZATION (CUSTOM PROCEDURE TRAY) ×2 IMPLANT
SHEATH PROBE COVER 6X72 (BAG) ×1 IMPLANT
TRANSDUCER W/STOPCOCK (MISCELLANEOUS) ×2 IMPLANT
TUBING CIL FLEX 10 FLL-RA (TUBING) ×2 IMPLANT

## 2019-12-30 NOTE — Discharge Instructions (Signed)
Radial Site Care  This sheet gives you information about how to care for yourself after your procedure. Your health care provider may also give you more specific instructions. If you have problems or questions, contact your health care provider. What can I expect after the procedure? After the procedure, it is common to have:  Bruising and tenderness at the catheter insertion area. Follow these instructions at home: Medicines  Take over-the-counter and prescription medicines only as told by your health care provider. Insertion site care  Follow instructions from your health care provider about how to take care of your insertion site. Make sure you: ? Wash your hands with soap and water before you change your bandage (dressing). If soap and water are not available, use hand sanitizer. ? Change your dressing as told by your health care provider. ? Leave stitches (sutures), skin glue, or adhesive strips in place. These skin closures may need to stay in place for 2 weeks or longer. If adhesive strip edges start to loosen and curl up, you may trim the loose edges. Do not remove adhesive strips completely unless your health care provider tells you to do that.  Check your insertion site every day for signs of infection. Check for: ? Redness, swelling, or pain. ? Fluid or blood. ? Pus or a bad smell. ? Warmth.  Do not take baths, swim, or use a hot tub until your health care provider approves.  You may shower 24-48 hours after the procedure, or as directed by your health care provider. ? Remove the dressing and gently wash the site with plain soap and water. ? Pat the area dry with a clean towel. ? Do not rub the site. That could cause bleeding.  Do not apply powder or lotion to the site. Activity   For 24 hours after the procedure, or as directed by your health care provider: ? Do not flex or bend the affected arm. ? Do not push or pull heavy objects with the affected arm. ? Do not  drive yourself home from the hospital or clinic. You may drive 24 hours after the procedure unless your health care provider tells you not to. ? Do not operate machinery or power tools.  Do not lift anything that is heavier than 10 lb (4.5 kg), or the limit that you are told, until your health care provider says that it is safe.  Ask your health care provider when it is okay to: ? Return to work or school. ? Resume usual physical activities or sports. ? Resume sexual activity. General instructions  If the catheter site starts to bleed, raise your arm and put firm pressure on the site. If the bleeding does not stop, get help right away. This is a medical emergency.  If you went home on the same day as your procedure, a responsible adult should be with you for the first 24 hours after you arrive home.  Keep all follow-up visits as told by your health care provider. This is important. Contact a health care provider if:  You have a fever.  You have redness, swelling, or yellow drainage around your insertion site. Get help right away if:  You have unusual pain at the radial site.  The catheter insertion area swells very fast.  The insertion area is bleeding, and the bleeding does not stop when you hold steady pressure on the area.  Your arm or hand becomes pale, cool, tingly, or numb. These symptoms may represent a serious problem   that is an emergency. Do not wait to see if the symptoms will go away. Get medical help right away. Call your local emergency services (911 in the [place] Do not drive yourself to the hospital. Summary  After the procedure, it is common to have bruising and tenderness at the site.  Follow instructions from your health care provider about how to take care of your radial site wound. Check the wound every day for signs of infection.  Do not lift anything that is heavier than 10 lb (4.5 kg), or the limit that you are told, until your health care provider says  that it is safe. This information is not intended to replace advice given to you by your health care provider. Make sure you discuss any questions you have with your health care provider. Document Revised: 09/04/2017 Document Reviewed: 09/04/2017 Elsevier Patient Education  2020 [employer]  

## 2019-12-30 NOTE — Interval H&P Note (Signed)
History and Physical Interval Note:  12/30/2019 8:15 AM  Angel [name]  has presented today for surgery, with the diagnosis of abnormal stress test.     The various methods of treatment have been discussed with the patient and family. After consideration of risks, benefits and other options for treatment, the patient has consented to  Procedure(s): LEFT HEART CATH AND CORONARY ANGIOGRAPHY (N/A)  PERCUTANEOUS CORONARY INTERVENTION  as a surgical intervention.  The patient's history has been reviewed, patient examined, no change in status, stable for surgery.  I have reviewed the patient's chart and labs.  Questions were answered to the patient's satisfaction.    PT referred for diagnostic cardiac catheterization due to ongoing Sx with Low Risk Abnormal Stress Test.  Cath Lab Visit (complete for each Cath Lab visit)  Clinical Evaluation Leading to the Procedure:   ACS: No.  Non-ACS:    Anginal Classification: CCS II  Anti-ischemic medical therapy: No Therapy  Non-Invasive Test Results: Low-risk stress test findings: cardiac mortality <1%/year - ongoing Sx.  Prior CABG: No previous CABG   Bryan Lemma

## 2020-02-03 ENCOUNTER — Other Ambulatory Visit: Payer: Self-pay

## 2020-02-03 ENCOUNTER — Ambulatory Visit: Payer: 59 | Admitting: Cardiology

## 2020-02-03 ENCOUNTER — Encounter: Payer: Self-pay | Admitting: Cardiology

## 2020-02-03 VITALS — BP 114/68 | HR 74 | Ht 66.0 in | Wt 209.0 lb

## 2020-02-03 DIAGNOSIS — R0600 Dyspnea, unspecified: Secondary | ICD-10-CM | POA: Diagnosis not present

## 2020-02-03 DIAGNOSIS — D573 Sickle-cell trait: Secondary | ICD-10-CM

## 2020-02-03 DIAGNOSIS — R0609 Other forms of dyspnea: Secondary | ICD-10-CM

## 2020-02-03 NOTE — Progress Notes (Signed)
Cardiology Office Note:    Date:  02/03/2020   ID:  Angel [name], DOB 03-Oct-1975, MRN [id number]  PCP:  Tresa Garter, MD  Cardiologist:  Garwin Brothers, MD   Referring MD: Tresa Garter, MD    ASSESSMENT:    1. DOE (dyspnea on exertion)   2. Sickle cell trait (HCC)    PLAN:    In order of problems listed above:  1. Primary prevention stressed with the patient.  Importance of compliance with diet medication stressed and she vocalized understanding.  Importance of regular exercise stressed.  I told her to walk at least half an hour a day 5 days a week at least 2 miles and lose weight.  She promises to do so.  Diet was emphasized. 2. Dyspnea on exertion: Stable at this point.  Cardiac evaluation is complete including echocardiogram and coronary angiography which is unremarkable.  I will be seeing her only on a follow-up as needed basis.  She will have to see her primary care physician for any other issues that may be causing the symptoms which may be noncardiac. 3. Sickle cell trait: Medical management.  Managed by primary care.    Medication Adjustments/Labs and Tests Ordered: Current medicines are reviewed at length with the patient today.  Concerns regarding medicines are outlined above.  No orders of the defined types were placed in this encounter.  No orders of the defined types were placed in this encounter.    No chief complaint on file.    History of Present Illness:    Angel [name] is a 44 y.o. female.  Patient has past medical history of dyspnea on exertion and sickle cell trait.  She denies any problems at this time and takes care of activities of daily living.  No chest pain orthopnea or PND.  Patient mentions to me that her shortness of breath pretty much remained steady.  This happens when she exerts herself.  At the time of my evaluation, the patient is alert awake oriented and in no distress.  Past Medical History:  Diagnosis Date  .  Atypical squamous cells of undetermined significance (ASC-US) on cervical Pap smear 10/30/2019  . Bloating symptom 04/30/2012   9/13 2 hrs long cramping episodes associated w/diarrhea x 3 episodes R/o a viral illness, GS, pancreatitis and other dx's 9/18 gluten free diet  . Breast lump 06/08/2013  . Breast mass 06/2013   bilateral  . Cardiac murmur 09/04/2019  . Cyst of right ovary 10/30/2019  . 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 DOE (dyspnea on exertion) 06/10/2019  . GERD (gastroesophageal reflux disease) 08/2014   takes Protonix daily  . Herniated disc   . History of anemia 10/30/2019  . History of dysmenorrhea 10/30/2019  . History of hematuria 10/30/2019  . Insomnia 10/07/2013   Chronic   . Irritant dermatitis 11/17/2014  . Migraine headache   . [place]mer[place] 10/30/2019  . Overweight 09/04/2019  . Sickle cell trait (HCC)   . Vitamin D deficiency 2009  . Vitamin D deficiency 09/03/2007   Chronic 2/14 Risks associated with treatment noncompliance were discussed. Compliance was encouraged.      Past Surgical History:  Procedure Laterality Date  . BILATERAL SALPINGECTOMY  09/26/2015   Procedure: BILATERAL SALPINGECTOMY;  Surgeon: Osborn Coho, MD;  Location: [facility];  Service: Gynecology;;  . BREAST BIOPSY Bilateral 07/13/2013   Procedure: BILATERAL EXCISION BREAS MASSES;  Surgeon: Shelly Rubenstein, MD;  Location: [facility];  Service: General;  Laterality: Bilateral;  . BREAST CYST EXCISION Left 2014   4 cysts removed  . DE QUERVAIN'S RELEASE Right 09/06/2003  . DILITATION & CURRETTAGE/HYSTROSCOPY WITH NOVASURE ABLATION N/A 09/26/2015   Procedure: DILATATION & CURETTAGE/HYSTEROSCOPY WITH NOVASURE ABLATION;  Surgeon: Osborn Coho, MD;  Location: [facility];  Service: Gynecology;  Laterality: N/A;  . LAPAROSCOPIC OVARIAN CYSTECTOMY Right 09/26/2015   Procedure: LAPAROSCOPIC Right OVARIAN CYSTECTOMY, Bilateral Peritoneal  Biopsies over Uteralsacral area;  Surgeon: Osborn Coho, MD;  Location: [facility];  Service: Gynecology;  Laterality: Right;  . LEFT HEART CATH AND CORONARY ANGIOGRAPHY N/A 12/30/2019   Procedure: LEFT HEART CATH AND CORONARY ANGIOGRAPHY;  Surgeon: Marykay Lex, MD;  Location: [facility];  Service: Cardiovascular;  Laterality: N/A;  . SPINAL FUSION  07/05/2018  . TUBAL LIGATION  12/27/2005  . WISDOM TOOTH EXTRACTION    . WRIST GANGLION EXCISION Right 09/06/2003  . WRIST GANGLION EXCISION Left 09/04/1999    Current Medications: Current Meds  Medication Sig  . acetaminophen (TYLENOL) 500 MG tablet Take 1,000 mg by mouth daily.   Marland Kitchen albuterol (PROVENTIL HFA;VENTOLIN HFA) 108 (90 Base) MCG/ACT inhaler Inhale 2 puffs into the lungs every 6 (six) hours as needed for wheezing or shortness of breath.  Marland Kitchen aspirin EC 81 MG tablet Take 1 tablet (81 mg total) by mouth daily.  . betamethasone dipropionate 0.05 % cream Apply 1 application topically daily as needed (Eczema).   . Cholecalciferol (VITAMIN D3) 2000 units capsule Take 1 capsule (2,000 Units total) by mouth daily. (Patient taking differently: Take 1,000 Units by mouth daily. )  . clindamycin (CLINDAGEL) 1 % gel Apply 1 application topically daily as needed (acne).   . fluticasone (FLONASE) 50 MCG/ACT nasal spray Place 2 sprays into both nostrils daily.  . hydrocortisone (ANUSOL-HC) 2.5 % rectal cream Use bid (Patient taking differently: Place 1 application rectally 2 (two) times daily as needed for hemorrhoids. )  . hydrocortisone (ANUSOL-HC) 25 MG suppository Place 1 suppository (25 mg total) rectally 2 (two) times daily as needed for hemorrhoids or itching.  . hydrOXYzine (ATARAX/VISTARIL) 10 MG tablet Take 1-2 tablets after dinner for itch  . ibuprofen (ADVIL,MOTRIN) 800 MG tablet Take 1 tablet (800 mg total) by mouth every 8 (eight) hours as needed. (Patient taking differently: Take 600 mg by mouth every 8 (eight) hours as needed for  moderate pain. )  . levocetirizine (XYZAL) 5 MG tablet Take 1 tablet (5 mg total) by mouth every evening.  . nitroGLYCERIN (NITROSTAT) 0.4 MG SL tablet Place 1 tablet (0.4 mg total) under the tongue every 5 (five) minutes as needed for chest pain.  Marland Kitchen spironolactone (ALDACTONE) 50 MG tablet Take 50 mg by mouth daily.   . SUMAtriptan (IMITREX) 100 MG tablet Take 1 tablet (100 mg total) by mouth every 2 (two) hours as needed for migraine. May repeat in 2 hours if headache persists or recurs.  Marland Kitchen tiZANidine (ZANAFLEX) 4 MG tablet Take 4 mg by mouth daily as needed (Back pain).      Allergies:   Other and Zolpidem   Social History   Socioeconomic History  . Marital status: Married    Spouse name: Not on file  . Number of children: 2  . Years of education: Not on file  . Highest education level: Not on file  Occupational History  . Not on file  Tobacco Use  . Smoking status: Never Smoker  . Smokeless tobacco: Never  Used  Substance and Sexual Activity  . Alcohol use: No  . Drug use: No  . Sexual activity: Not on file    Comment: BTL  Other Topics Concern  . Not on file  Social History Narrative  . Not on file   Social Determinants of Health   Financial Resource Strain:   . Difficulty of Paying Living Expenses:   Food Insecurity:   . Worried About Charity fundraiser in the Last Year:   . Arboriculturist in the Last Year:   Transportation Needs:   . Film/video editor (Medical):   Marland Kitchen Lack of Transportation (Non-Medical):   Physical Activity:   . Days of Exercise per Week:   . Minutes of Exercise per Session:   Stress:   . Feeling of Stress :   Social Connections:   . Frequency of Communication with Friends and Family:   . Frequency of Social Gatherings with Friends and Family:   . Attends Religious Services:   . Active Member of Clubs or Organizations:   . Attends Archivist Meetings:   Marland Kitchen Marital Status:      Family History: The patient's family history  includes Cancer in her father; Diabetes in her father; Heart disease in her father; Hypertension in her father; Sickle cell anemia in her daughter and son.  ROS:   Please see the history of present illness.    All other systems reviewed and are negative.  EKGs/Labs/Other Studies Reviewed:    The following studies were reviewed today: Leonie Man, MD (Primary)    Procedures  LEFT HEART CATH AND CORONARY ANGIOGRAPHY  Conclusion    There is hyperdynamic left ventricular systolic function.  LV end diastolic pressure is normal.  The left ventricular ejection fraction is greater than 65% by visual estimate.  There is no aortic valve stenosis.   SUMMARY  Angiographically normal coronary arteries  Hyperdynamic left ventricle with normal LVEDP.    Recent Labs: 06/08/2019: TSH 2.80 07/06/2019: ALT 15; Magnesium 2.1 12/14/2019: BUN 10; Creatinine, Ser 0.85; Potassium 4.3; Sodium 141 12/25/2019: Hemoglobin 13.2; Platelets 299  Recent Lipid Panel    Component Value Date/Time   CHOL 181 06/08/2019 0928   TRIG 70.0 06/08/2019 0928   HDL 66.40 06/08/2019 0928   CHOLHDL 3 06/08/2019 0928   VLDL 14.0 06/08/2019 0928   LDLCALC 101 (H) 06/08/2019 0928    Physical Exam:    VS:  BP 114/68   Pulse 74   Ht 5\' 6"  (1.676 m)   Wt 209 lb (94.8 kg)   SpO2 99%   BMI 33.73 kg/m     Wt Readings from Last 3 Encounters:  02/03/20 209 lb (94.8 kg)  12/30/19 202 lb (91.6 kg)  12/25/19 203 lb (92.1 kg)     GEN: Patient is in no acute distress HEENT: Normal NECK: No JVD; No carotid bruits LYMPHATICS: No lymphadenopathy CARDIAC: Hear sounds regular, 2/6 systolic murmur at the apex. RESPIRATORY:  Clear to auscultation without rales, wheezing or rhonchi  ABDOMEN: Soft, non-tender, non-distended MUSCULOSKELETAL:  No edema; No deformity  SKIN: Warm and dry NEUROLOGIC:  Alert and oriented x 3 PSYCHIATRIC:  Normal affect   Signed, Jenean Lindau, MD  02/03/2020 4:17 PM    [facility]

## 2020-02-03 NOTE — Patient Instructions (Signed)
Medication Instructions:  No medication changes. *If you need a refill on your cardiac medications before your next appointment, please call your pharmacy*   Lab Work: None ordered If you have labs (blood work) drawn today and your tests are completely normal, you will receive your results only by: . MyChart Message (if you have MyChart) OR . A paper copy in the mail If you have any lab test that is abnormal or we need to change your treatment, we will call you to review the results.   Testing/Procedures: None ordered   Follow-Up: At [facility], you and your health needs are our priority.  As part of our continuing mission to provide you with exceptional heart care, we have created designated Provider Care Teams.  These Care Teams include your primary Cardiologist (physician) and Advanced Practice Providers (APPs -  Physician Assistants and Nurse Practitioners) who all work together to provide you with the care you need, when you need it.  We recommend signing up for the patient portal called "MyChart".  Sign up information is provided on this After Visit Summary.  MyChart is used to connect with patients for Virtual Visits (Telemedicine).  Patients are able to view lab/test results, encounter notes, upcoming appointments, etc.  Non-urgent messages can be sent to your provider as well.   To learn more about what you can do with MyChart, go to [email or web address].    Your next appointment:   Follow up as needed.  The format for your next appointment:   In Person  Provider:   Rajan Revankar, MD   Other Instructions NA 

## 2020-03-02 ENCOUNTER — Other Ambulatory Visit: Payer: Self-pay | Admitting: Family

## 2020-03-02 MED ORDER — LEVOCETIRIZINE DIHYDROCHLORIDE 5 MG PO TABS: 5.0000 mg | ORAL_TABLET | Freq: Every evening | ORAL | 1 refills | Status: DC

## 2020-05-19 ENCOUNTER — Telehealth: Payer: Self-pay | Admitting: Internal Medicine

## 2020-05-19 NOTE — Telephone Encounter (Signed)
Patient is calling requesting a new RX for a medication another doctor has sent it before. They will not refill the medication because they informed her to follow up with pcp to get a refill. Patient has made an appointment for end of the month but still wants medication called in.   Requesting a call back from CMA.

## 2020-05-20 NOTE — Telephone Encounter (Signed)
What is the medication ? Thanks.

## 2020-05-24 NOTE — Telephone Encounter (Signed)
The medication is Celebrex.Marland KitchenRaechel Chute

## 2020-05-26 IMAGING — US US RENAL
1 series · 14 of 25 positions shown · non-contrast
Comparison: CT 06/02/2015, report 04/04/2018

ADDENDUM:
Images from the patient's previous MRI dated 04/04/2018 have been
made available for direct comparison. Small parapelvic cyst in the
mid left kidney appears to correspond to the MRI cystic lesion in
the left kidney.
CLINICAL DATA: Follow-up lesion on MRI

EXAM:
RENAL / URINARY TRACT ULTRASOUND COMPLETE

[Series 1: us renal · 0.23mm/px · 14 of 48 slices shown]
[im 1/48]
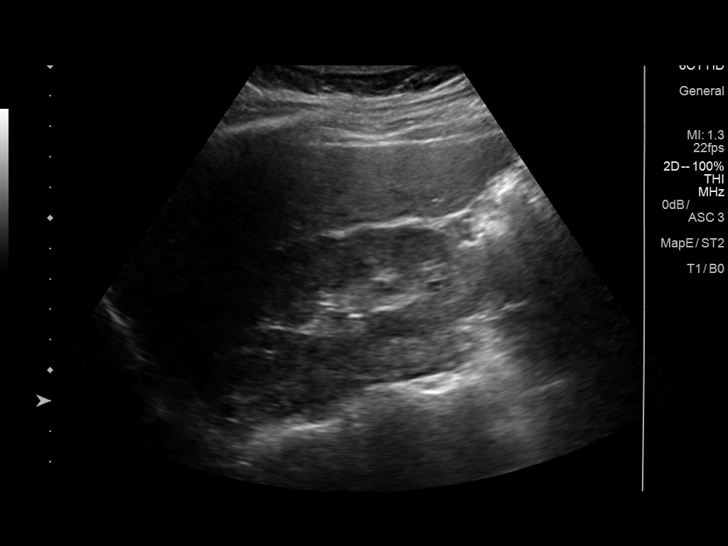
[im 4/48]
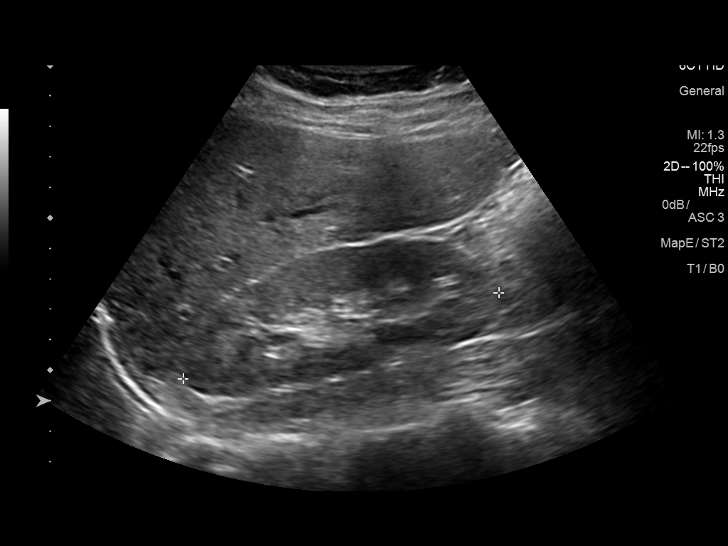
[im 8/48]
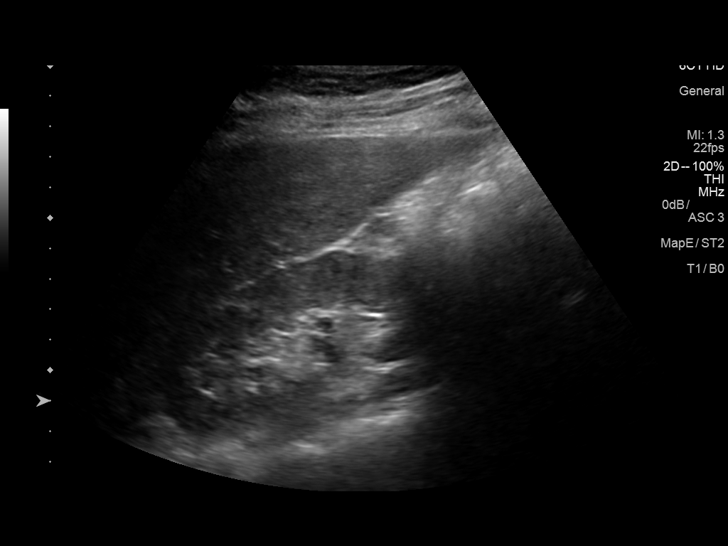
[im 12/48]
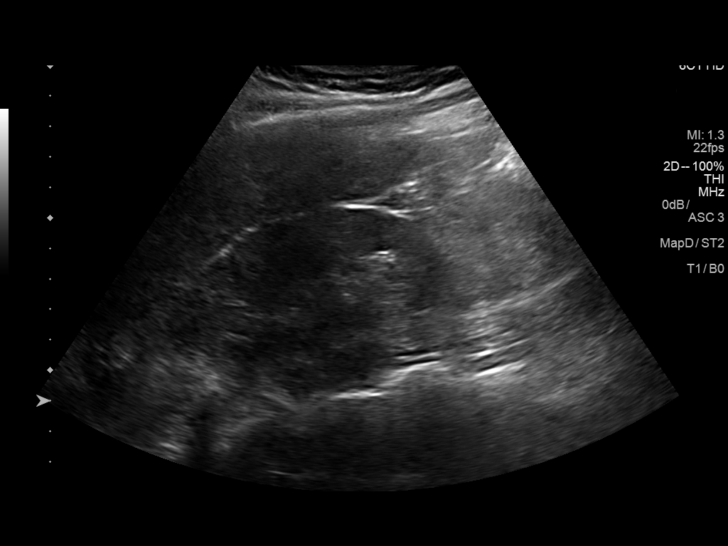
[im 16/48]
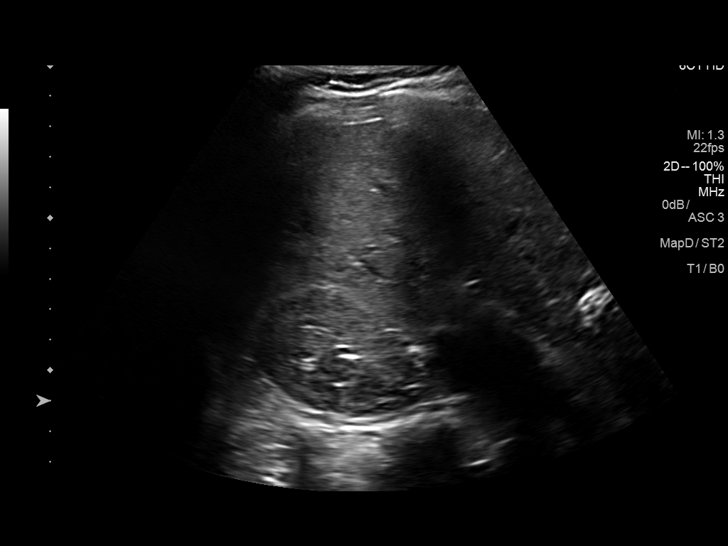
[im 18/48]
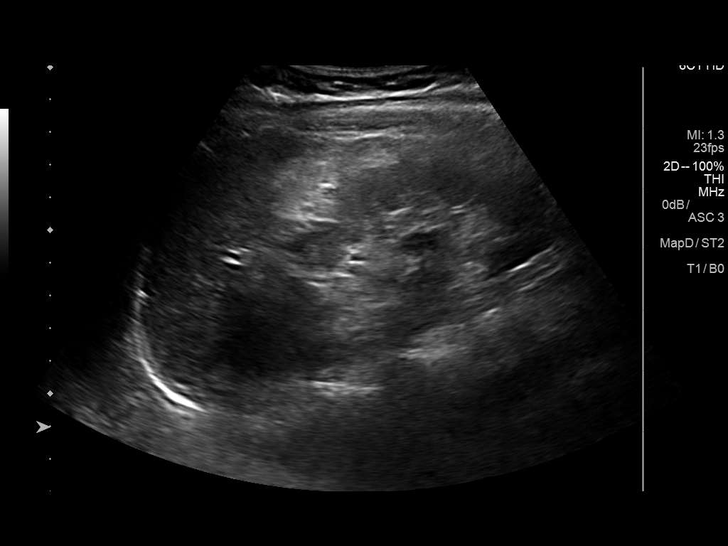
[im 22/48]
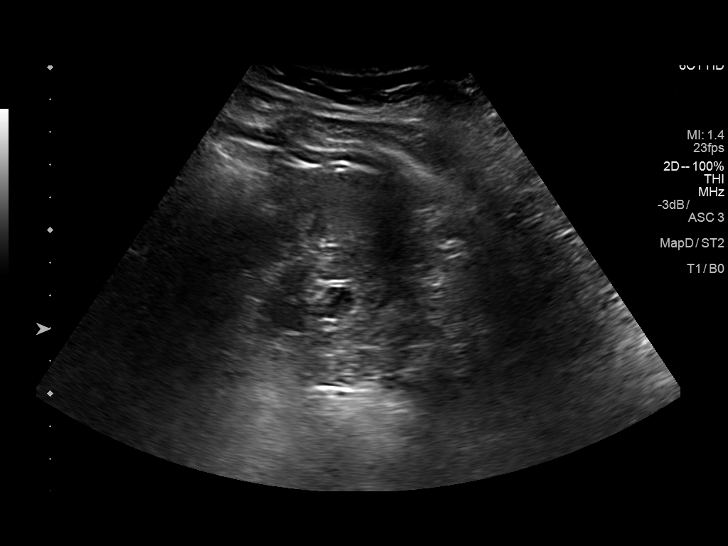
[im 26/48]
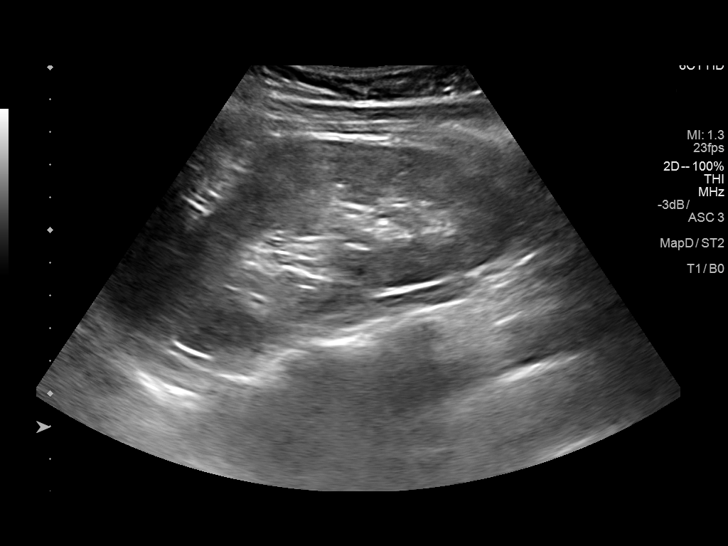
[im 30/48]
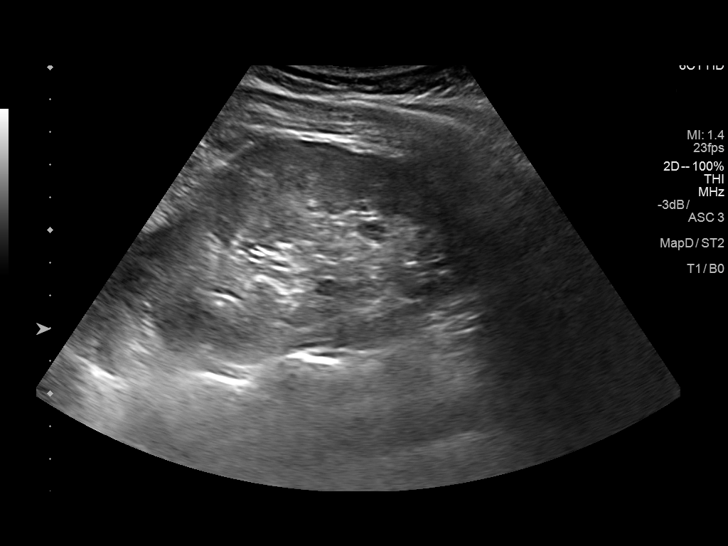
[im 32/48]
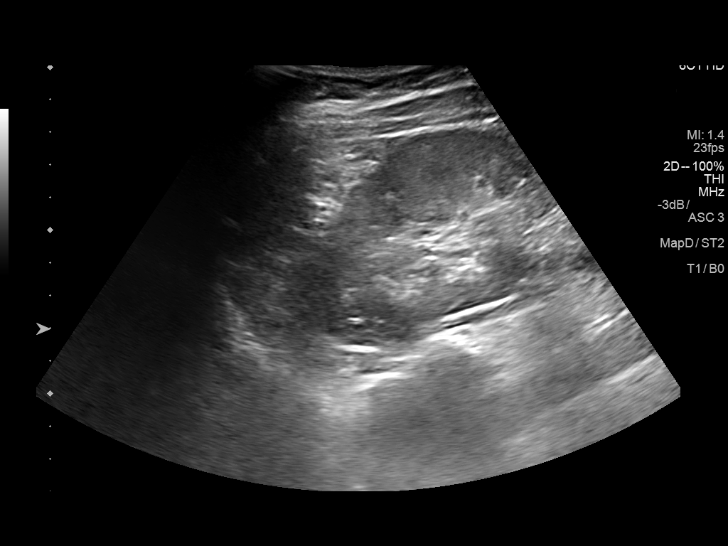
[im 36/48]
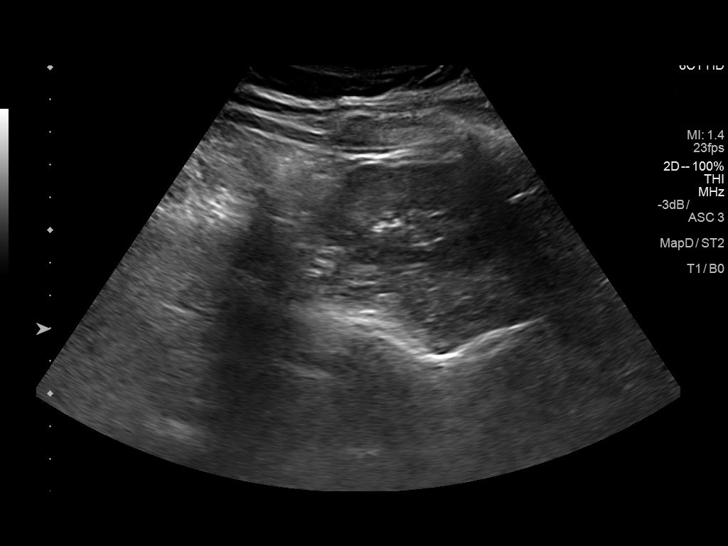
[im 40/48]
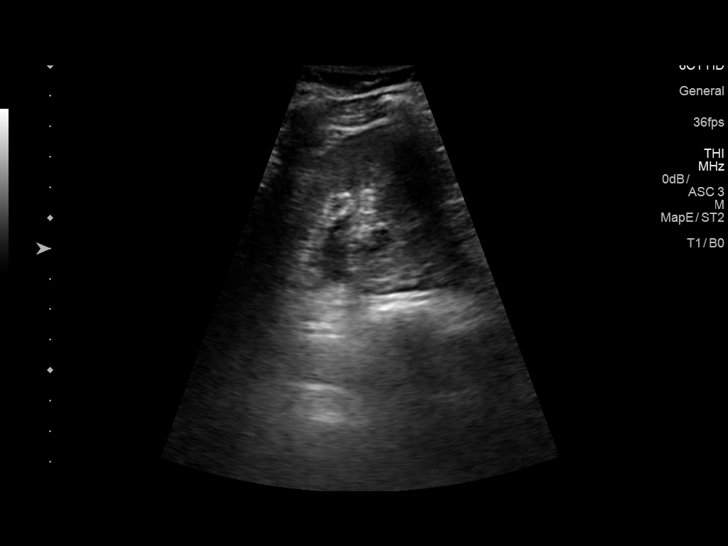
[im 44/48]
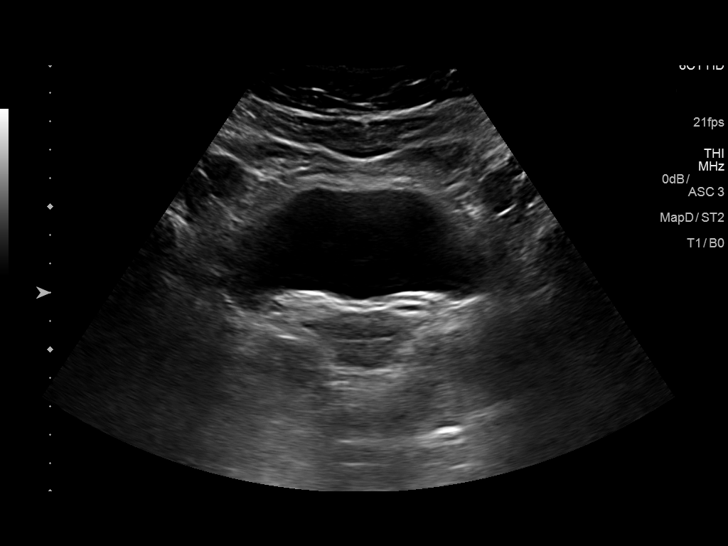
[im 48/48]
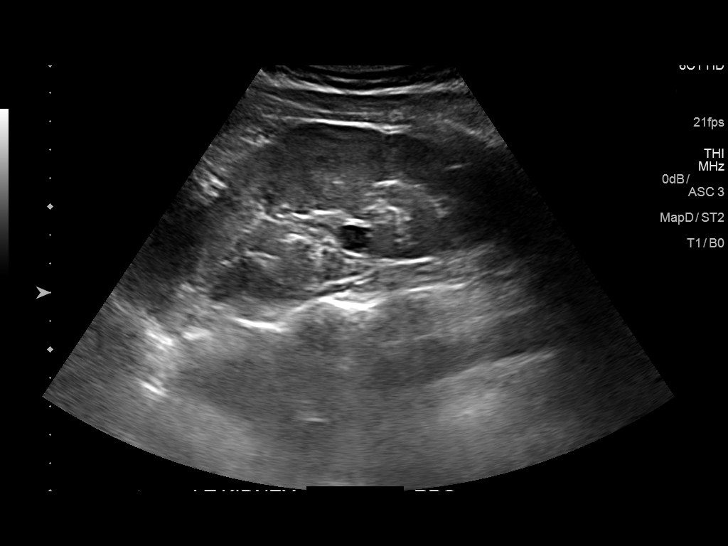

[14 of 25 positions shown; findings below may reference images not displayed]

FINDINGS: Right Kidney:

Length: 10.9 cm. Echogenicity within normal limits. No mass or
hydronephrosis visualized.

Left Kidney:

Length: 11.4 cm. Cortical echogenicity within normal limits. Mild
left renal pelvis dilatation which resolves postvoid. Residual small
cystic area near the left renal pelvis measuring 0.9 by 0.8 x
cm, possible small parapelvic cyst.

Bladder:

Appears normal for degree of bladder distention.
IMPRESSION: 1. Normal ultrasound appearance of right kidney
2. Mild left renal pelvis dilatation which resolved postvoid. Small
residual cystic area near the left renal pelvis measuring 9 mm,
possibly a small parapelvic cyst

## 2020-05-31 ENCOUNTER — Telehealth: Payer: Self-pay | Admitting: Internal Medicine

## 2020-05-31 MED ORDER — CELECOXIB 200 MG PO CAPS
200.0000 mg | ORAL_CAPSULE | Freq: Every day | ORAL | 1 refills | Status: DC | PRN
Start: 2020-05-31 — End: 2020-10-27

## 2020-05-31 NOTE — Telephone Encounter (Signed)
Ok Done Thx 

## 2020-05-31 NOTE — Telephone Encounter (Signed)
ok 

## 2020-06-08 ENCOUNTER — Other Ambulatory Visit: Payer: Self-pay

## 2020-06-08 ENCOUNTER — Ambulatory Visit (INDEPENDENT_AMBULATORY_CARE_PROVIDER_SITE_OTHER): Payer: 59 | Admitting: Internal Medicine

## 2020-06-08 ENCOUNTER — Encounter: Payer: Self-pay | Admitting: Internal Medicine

## 2020-06-08 VITALS — BP 112/70 | HR 68 | Temp 98.6°F | Wt 205.4 lb

## 2020-06-08 DIAGNOSIS — G4700 Insomnia, unspecified: Secondary | ICD-10-CM | POA: Diagnosis not present

## 2020-06-08 DIAGNOSIS — E559 Vitamin D deficiency, unspecified: Secondary | ICD-10-CM

## 2020-06-08 DIAGNOSIS — Z0000 Encounter for general adult medical examination without abnormal findings: Secondary | ICD-10-CM | POA: Insufficient documentation

## 2020-06-08 LAB — URINALYSIS
Bilirubin Urine: NEGATIVE
Hgb urine dipstick: NEGATIVE
Ketones, ur: NEGATIVE
Leukocytes,Ua: NEGATIVE
Nitrite: NEGATIVE
Specific Gravity, Urine: 1.02 (ref 1.000–1.030)
Total Protein, Urine: NEGATIVE
Urine Glucose: NEGATIVE
Urobilinogen, UA: 0.2 (ref 0.0–1.0)
pH: 6 (ref 5.0–8.0)

## 2020-06-08 LAB — LIPID PANEL
Cholesterol: 206 mg/dL — ABNORMAL HIGH (ref 0–200)
HDL: 83.8 mg/dL (ref >39.00)
LDL Cholesterol: 109 mg/dL — ABNORMAL HIGH (ref 0–99)
NonHDL: 122.15
Total CHOL/HDL Ratio: 2
Triglycerides: 65 mg/dL (ref 0.0–149.0)
VLDL: 13 mg/dL (ref 0.0–40.0)

## 2020-06-08 LAB — COMPREHENSIVE METABOLIC PANEL
ALT: 16 U/L (ref 0–35)
AST: 16 U/L (ref 0–37)
Albumin: 4.5 g/dL (ref 3.5–5.2)
Alkaline Phosphatase: 51 U/L (ref 39–117)
BUN: 12 mg/dL (ref 6–23)
CO2: 29 mEq/L (ref 19–32)
Calcium: 9.6 mg/dL (ref 8.4–10.5)
Chloride: 102 mEq/L (ref 96–112)
Creatinine, Ser: 0.84 mg/dL (ref 0.40–1.20)
GFR: 84.7 mL/min (ref >60.00)
Glucose, Bld: 92 mg/dL (ref 70–99)
Potassium: 3.5 mEq/L (ref 3.5–5.1)
Sodium: 140 mEq/L (ref 135–145)
Total Bilirubin: 0.4 mg/dL (ref 0.2–1.2)
Total Protein: 7.2 g/dL (ref 6.0–8.3)

## 2020-06-08 LAB — CBC WITH DIFFERENTIAL/PLATELET
Basophils Absolute: 0 10*3/uL (ref 0.0–0.1)
Basophils Relative: 0.5 % (ref 0.0–3.0)
Eosinophils Absolute: 0.1 10*3/uL (ref 0.0–0.7)
Eosinophils Relative: 1.7 % (ref 0.0–5.0)
HCT: 41.1 % (ref 36.0–46.0)
Hemoglobin: 13.5 g/dL (ref 12.0–15.0)
Lymphocytes Relative: 37.9 % (ref 12.0–46.0)
Lymphs Abs: 1.8 10*3/uL (ref 0.7–4.0)
MCHC: 32.9 g/dL (ref 30.0–36.0)
MCV: 91.3 fl (ref 78.0–100.0)
Monocytes Absolute: 0.4 10*3/uL (ref 0.1–1.0)
Monocytes Relative: 8.6 % (ref 3.0–12.0)
Neutro Abs: 2.5 10*3/uL (ref 1.4–7.7)
Neutrophils Relative %: 51.3 % (ref 43.0–77.0)
Platelets: 266 10*3/uL (ref 150.0–400.0)
RBC: 4.5 Mil/uL (ref 3.87–5.11)
RDW: 13.9 % (ref 11.5–15.5)
WBC: 4.8 10*3/uL (ref 4.0–10.5)

## 2020-06-08 LAB — TSH: TSH: 2.68 u[IU]/mL (ref 0.35–4.50)

## 2020-06-08 LAB — VITAMIN B12: Vitamin B-12: 298 pg/mL (ref 211–911)

## 2020-06-08 LAB — VITAMIN D 25 HYDROXY (VIT D DEFICIENCY, FRACTURES): VITD: 20.57 ng/mL — ABNORMAL LOW (ref 30.00–100.00)

## 2020-06-08 MED ORDER — LORAZEPAM 0.5 MG PO TABS: 0.5000 mg | ORAL_TABLET | Freq: Every evening | ORAL | 3 refills | Status: DC | PRN

## 2020-06-08 NOTE — Assessment & Plan Note (Signed)

## 2020-06-08 NOTE — Progress Notes (Signed)
Subjective:  Patient ID: Angel [name], female    DOB: 01-07-1976  Age: 44 y.o. MRN: [id number]  CC: Annual Exam   HPI BRITT THEARD presents for a well exam  Outpatient Medications Prior to Visit  Medication Sig Dispense Refill   acetaminophen (TYLENOL) 500 MG tablet Take 1,000 mg by mouth daily.      albuterol (PROVENTIL HFA;VENTOLIN HFA) 108 (90 Base) MCG/ACT inhaler Inhale 2 puffs into the lungs every 6 (six) hours as needed for wheezing or shortness of breath. 1 Inhaler 1   aspirin EC 81 MG tablet Take 1 tablet (81 mg total) by mouth daily. 90 tablet 3   betamethasone dipropionate 0.05 % cream Apply 1 application topically daily as needed (Eczema).      celecoxib (CELEBREX) 200 MG capsule Take 1 capsule (200 mg total) by mouth daily as needed for moderate pain. 30 capsule 1   Cholecalciferol (VITAMIN D3) 2000 units capsule Take 1 capsule (2,000 Units total) by mouth daily. (Patient taking differently: Take 1,000 Units by mouth daily. ) 100 capsule 3   clindamycin (CLINDAGEL) 1 % gel Apply 1 application topically daily as needed (acne).   11   fluticasone (FLONASE) 50 MCG/ACT nasal spray Place 2 sprays into both nostrils daily. 16 g 6   hydrocortisone (ANUSOL-HC) 2.5 % rectal cream Use bid (Patient taking differently: Place 1 application rectally 2 (two) times daily as needed for hemorrhoids. ) 30 g 1   hydrocortisone (ANUSOL-HC) 25 MG suppository Place 1 suppository (25 mg total) rectally 2 (two) times daily as needed for hemorrhoids or itching. 20 suppository 0   hydrOXYzine (ATARAX/VISTARIL) 10 MG tablet Take 1-2 tablets after dinner for itch     levocetirizine (XYZAL) 5 MG tablet Take 1 tablet (5 mg total) by mouth every evening. 30 tablet 1   spironolactone (ALDACTONE) 50 MG tablet Take 50 mg by mouth daily.      SUMAtriptan (IMITREX) 100 MG tablet Take 1 tablet (100 mg total) by mouth every 2 (two) hours as needed for migraine. May repeat in 2 hours if headache  persists or recurs. 12 tablet 3   tiZANidine (ZANAFLEX) 4 MG tablet Take 4 mg by mouth daily as needed (Back pain).      nitroGLYCERIN (NITROSTAT) 0.4 MG SL tablet Place 1 tablet (0.4 mg total) under the tongue every 5 (five) minutes as needed for chest pain. 25 tablet 6   No facility-administered medications prior to visit.    ROS: Review of Systems  Constitutional: Positive for unexpected weight change. Negative for activity change, appetite change, chills and fatigue.  HENT: Negative for congestion, mouth sores and sinus pressure.   Eyes: Negative for visual disturbance.  Respiratory: Negative for cough and chest tightness.   Gastrointestinal: Negative for abdominal pain and nausea.  Genitourinary: Negative for difficulty urinating, frequency and vaginal pain.  Musculoskeletal: Negative for back pain and gait problem.  Skin: Negative for pallor and rash.  Neurological: Negative for dizziness, tremors, weakness, numbness and headaches.  Psychiatric/Behavioral: Positive for sleep disturbance. Negative for confusion.    Objective:  BP 112/70 (BP Location: Left Arm)    Pulse 68    Temp 98.6 F (37 C) (Oral)    Wt 205 lb 6.4 oz (93.2 kg)    SpO2 99%    BMI 33.15 kg/m   BP Readings from Last 3 Encounters:  06/08/20 112/70  02/03/20 114/68  12/30/19 109/63    Wt Readings from Last 3 Encounters:  06/08/20 205  lb 6.4 oz (93.2 kg)  02/03/20 209 lb (94.8 kg)  12/30/19 202 lb (91.6 kg)    Physical Exam Constitutional:      General: She is not in acute distress.    Appearance: She is well-developed. She is obese.  HENT:     Head: Normocephalic.     Right Ear: External ear normal.     Left Ear: External ear normal.     Nose: Nose normal.  Eyes:     General:        Right eye: No discharge.        Left eye: No discharge.     Conjunctiva/sclera: Conjunctivae normal.     Pupils: Pupils are equal, round, and reactive to light.  Neck:     Thyroid: No thyromegaly.     Vascular:  No JVD.     Trachea: No tracheal deviation.  Cardiovascular:     Rate and Rhythm: Normal rate and regular rhythm.     Heart sounds: Normal heart sounds.  Pulmonary:     Effort: No respiratory distress.     Breath sounds: No stridor. No wheezing.  Abdominal:     General: Bowel sounds are normal. There is no distension.     Palpations: Abdomen is soft. There is no mass.     Tenderness: There is no abdominal tenderness. There is no guarding or rebound.  Musculoskeletal:        General: No tenderness.     Cervical back: Normal range of motion and neck supple.  Lymphadenopathy:     Cervical: No cervical adenopathy.  Skin:    Findings: No erythema or rash.  Neurological:     Mental Status: She is oriented to person, place, and time.     Cranial Nerves: No cranial nerve deficit.     Motor: No abnormal muscle tone.     Coordination: Coordination normal.     Deep Tendon Reflexes: Reflexes normal.  Psychiatric:        Behavior: Behavior normal.        Thought Content: Thought content normal.        Judgment: Judgment normal.     Lab Results  Component Value Date   WBC 7.2 12/25/2019   HGB 13.2 12/25/2019   HCT 40.3 12/25/2019   PLT 299 12/25/2019   GLUCOSE 90 12/14/2019   CHOL 181 06/08/2019   TRIG 70.0 06/08/2019   HDL 66.40 06/08/2019   LDLCALC 101 (H) 06/08/2019   ALT 15 07/06/2019   AST 16 07/06/2019   NA 141 12/14/2019   K 4.3 12/14/2019   CL 105 12/14/2019   CREATININE 0.85 12/14/2019   BUN 10 12/14/2019   CO2 24 12/14/2019   TSH 2.80 06/08/2019   HGBA1C 5.6 10/18/2014    No results found.  Assessment & Plan:   There are no diagnoses linked to this encounter.   No orders of the defined types were placed in this encounter.    Follow-up: No follow-ups on file.  Sonda Primes, MD

## 2020-06-08 NOTE — Addendum Note (Signed)
Addended by: Vincenza Hews on: 06/08/2020 02:20 PM   Modules accepted: Orders

## 2020-06-08 NOTE — Assessment & Plan Note (Signed)
Vit D 

## 2020-06-09 ENCOUNTER — Other Ambulatory Visit: Payer: Self-pay | Admitting: Internal Medicine

## 2020-06-09 MED ORDER — VITAMIN D3 1.25 MG (50000 UT) PO CAPS: 1.0000 | ORAL_CAPSULE | ORAL | 0 refills | Status: DC

## 2020-07-15 ENCOUNTER — Telehealth (INDEPENDENT_AMBULATORY_CARE_PROVIDER_SITE_OTHER): Payer: 59 | Admitting: Family

## 2020-07-15 ENCOUNTER — Encounter: Payer: Self-pay | Admitting: Family

## 2020-07-15 ENCOUNTER — Other Ambulatory Visit: Payer: Self-pay

## 2020-07-15 DIAGNOSIS — R040 Epistaxis: Secondary | ICD-10-CM

## 2020-07-15 NOTE — Patient Instructions (Signed)
Nosebleed, Adult A nosebleed is when blood comes out of the nose. Nosebleeds are common. Usually, they are not a sign of a serious condition. Nosebleeds can happen if a small blood vessel in your nose starts to bleed or if the lining of your nose (mucous membrane) cracks. They are commonly caused by:  Allergies.  Colds.  Picking your nose.  Blowing your nose too hard.  An injury from sticking an object into your nose or getting hit in the nose.  Dry or cold air. Less common causes of nosebleeds include:  Toxic fumes.  Something abnormal in the nose or in the air-filled spaces in the bones of the face (sinuses).  Growths in the nose, such as polyps.  Medicines or conditions that cause blood to clot slowly.  Certain illnesses or procedures that irritate or dry out the nasal passages. Follow these instructions at home: When you have a nosebleed:   Sit down and tilt your head slightly forward.  Use a clean towel or tissue to pinch your nostrils under the bony part of your nose. After 10 minutes, let go of your nose and see if bleeding starts again. Do not release pressure before that time. If there is still bleeding, repeat the pinching and holding for 10 minutes until the bleeding stops.  Do not place tissues or gauze in the nose to stop bleeding.  Avoid lying down and avoid tilting your head backward. That may make blood collect in the throat and cause gagging or coughing.  Use a nasal spray decongestant to help with a nosebleed as told by your health care provider.  Do not use petroleum jelly or mineral oil in your nose. It can drip into your lungs. After a nosebleed:  Avoid blowing your nose or sniffing for a number of hours.  Avoid straining, lifting, or bending at the waist for several days. You may resume other normal activities as you are able.  Use saline spray or a humidifier as told by your health care provider.  Aspirinand blood thinners make bleeding more  likely. If you are prescribed these medicines and you suffer from nosebleeds: ? Ask your health care provider if you should stop taking the medicines or if you should adjust the dose. ? Do not stop taking medicines that your health care provider has recommended unless told by your health care provider.  If your nosebleed was caused by dry mucous membranes, use over-the-counter saline nasal spray or gel. This will keep the mucous membranes moist and allow them to heal. If you must use a lubricant: ? Choose one that is water-soluble. ? Use only as much as you need and use it only as often as needed. ? Do not lie down until several hours after you use it. Contact a health care provider if:  You have a fever.  You get nosebleeds often or more often than usual.  You bruise very easily.  You have a nosebleed from having something stuck in your nose.  You have bleeding in your mouth.  You vomit or cough up brown material.  You have a nosebleed after you start a new medicine. Get help right away if:  You have a nosebleed after a fall or a head injury.  Your nosebleed does not go away after 20 minutes.  You feel dizzy or weak.  You have unusual bleeding from other parts of your body.  You have unusual bruising on other parts of your body.  You become sweaty.  You   vomit blood. This information is not intended to replace advice given to you by your health care provider. Make sure you discuss any questions you have with your health care provider. Document Revised: 10/29/2017 Document Reviewed: 02/14/2016 Elsevier Patient Education  2020 [employer]  

## 2020-07-15 NOTE — Progress Notes (Signed)
Angel [name] is a 44 y.o. female with the following history as recorded in [employer]:  Patient Active Problem List   Diagnosis Date Noted  . Well adult exam 06/08/2020  . Abnormal stress test 12/25/2019  . Atypical squamous cells of undetermined significance (ASC-US) on cervical Pap smear 10/30/2019  . Cyst of right ovary 10/30/2019  . History of anemia 10/30/2019  . History of dysmenorrhea 10/30/2019  . History of hematuria 10/30/2019  . Mittelschmerz 10/30/2019  . Cardiac murmur 09/04/2019  . Overweight 09/04/2019  . DOE (dyspnea on exertion) 06/10/2019  . 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 Irritant dermatitis 11/17/2014  . Insomnia 10/07/2013  . Breast lump 06/08/2013  . Bloating symptom 04/30/2012  . Vitamin D deficiency 09/03/2007  . Sickle cell trait (HCC) 05/20/2007    Current Outpatient Medications  Medication Sig Dispense Refill  . acetaminophen (TYLENOL) 500 MG tablet Take 1,000 mg by mouth daily.     Marland Kitchen albuterol (PROVENTIL HFA;VENTOLIN HFA) 108 (90 Base) MCG/ACT inhaler Inhale 2 puffs into the lungs every 6 (six) hours as needed for wheezing or shortness of breath. 1 Inhaler 1  . aspirin EC 81 MG tablet Take 1 tablet (81 mg total) by mouth daily. 90 tablet 3  . betamethasone dipropionate 0.05 % cream Apply 1 application topically daily as needed (Eczema).     . celecoxib (CELEBREX) 200 MG capsule Take 1 capsule (200 mg total) by mouth daily as needed for moderate pain. 30 capsule 1  . Cholecalciferol (VITAMIN D3) 1.25 MG (50000 UT) CAPS Take 1 capsule by mouth once a week. 6 capsule 0  . Cholecalciferol (VITAMIN D3) 2000 units capsule Take 1 capsule (2,000 Units total) by mouth daily. (Patient taking differently: Take 1,000 Units by mouth daily. ) 100 capsule 3  . clindamycin (CLINDAGEL) 1 % gel Apply 1 application topically daily as needed (acne).   11  . fluticasone (FLONASE) 50 MCG/ACT nasal spray Place 2  sprays into both nostrils daily. 16 g 6  . hydrocortisone (ANUSOL-HC) 2.5 % rectal cream Use bid (Patient taking differently: Place 1 application rectally 2 (two) times daily as needed for hemorrhoids. ) 30 g 1  . hydrocortisone (ANUSOL-HC) 25 MG suppository Place 1 suppository (25 mg total) rectally 2 (two) times daily as needed for hemorrhoids or itching. 20 suppository 0  . hydrOXYzine (ATARAX/VISTARIL) 10 MG tablet Take 1-2 tablets after dinner for itch    . levocetirizine (XYZAL) 5 MG tablet Take 1 tablet (5 mg total) by mouth every evening. 30 tablet 1  . LORazepam (ATIVAN) 0.5 MG tablet Take 1-2 tablets (0.5-1 mg total) by mouth at bedtime as needed for anxiety or sleep. 60 tablet 3  . nitroGLYCERIN (NITROSTAT) 0.4 MG SL tablet Place 1 tablet (0.4 mg total) under the tongue every 5 (five) minutes as needed for chest pain. 25 tablet 6  . spironolactone (ALDACTONE) 50 MG tablet Take 50 mg by mouth daily.     . SUMAtriptan (IMITREX) 100 MG tablet Take 1 tablet (100 mg total) by mouth every 2 (two) hours as needed for migraine. May repeat in 2 hours if headache persists or recurs. 12 tablet 3  . tiZANidine (ZANAFLEX) 4 MG tablet Take 4 mg by mouth daily as needed (Back pain).      No current facility-administered medications for this visit.    Allergies: Other, Trazodone and nefazodone, and Zolpidem  Past Medical History:  Diagnosis Date  . Atypical squamous cells  of undetermined significance (ASC-US) on cervical Pap smear 10/30/2019  . Bloating symptom 04/30/2012   9/13 2 hrs long cramping episodes associated w/diarrhea x 3 episodes R/o a viral illness, GS, pancreatitis and other dx's 9/18 gluten free diet  . Breast lump 06/08/2013  . Breast mass 06/2013   bilateral  . Cardiac murmur 09/04/2019  . Cyst of right ovary 10/30/2019  . 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 DOE (dyspnea on exertion) 06/10/2019  . GERD (gastroesophageal reflux  disease) 08/2014   takes Protonix daily  . Herniated disc   . History of anemia 10/30/2019  . History of dysmenorrhea 10/30/2019  . History of hematuria 10/30/2019  . Insomnia 10/07/2013   Chronic   . Irritant dermatitis 11/17/2014  . Migraine headache   . Mittelschmerz 10/30/2019  . Overweight 09/04/2019  . Sickle cell trait (HCC)   . Vitamin D deficiency 2009  . Vitamin D deficiency 09/03/2007   Chronic 2/14 Risks associated with treatment noncompliance were discussed. Compliance was encouraged.      Past Surgical History:  Procedure Laterality Date  . BILATERAL SALPINGECTOMY  09/26/2015   Procedure: BILATERAL SALPINGECTOMY;  Surgeon: Osborn Coho, MD;  Location: [facility];  Service: Gynecology;;  . BREAST BIOPSY Bilateral 07/13/2013   Procedure: BILATERAL EXCISION BREAS MASSES;  Surgeon: Shelly Rubenstein, MD;  Location: [facility];  Service: General;  Laterality: Bilateral;  . BREAST CYST EXCISION Left 2014   4 cysts removed  . DE QUERVAIN'S RELEASE Right 09/06/2003  . DILITATION & CURRETTAGE/HYSTROSCOPY WITH NOVASURE ABLATION N/A 09/26/2015   Procedure: DILATATION & CURETTAGE/HYSTEROSCOPY WITH NOVASURE ABLATION;  Surgeon: Osborn Coho, MD;  Location: [facility];  Service: Gynecology;  Laterality: N/A;  . LAPAROSCOPIC OVARIAN CYSTECTOMY Right 09/26/2015   Procedure: LAPAROSCOPIC Right OVARIAN CYSTECTOMY, Bilateral Peritoneal Biopsies over Uteralsacral area;  Surgeon: Osborn Coho, MD;  Location: [facility];  Service: Gynecology;  Laterality: Right;  . LEFT HEART CATH AND CORONARY ANGIOGRAPHY N/A 12/30/2019   Procedure: LEFT HEART CATH AND CORONARY ANGIOGRAPHY;  Surgeon: Marykay Lex, MD;  Location: [facility];  Service: Cardiovascular;  Laterality: N/A;  . SPINAL FUSION  07/05/2018  . TUBAL LIGATION  12/27/2005  . WISDOM TOOTH EXTRACTION    . WRIST GANGLION EXCISION Right 09/06/2003  . WRIST GANGLION EXCISION Left 09/04/1999    Family History  Problem Relation Age of  Onset  . Hypertension Father   . Diabetes Father   . Heart disease Father   . Cancer Father        prostate  . Sickle cell anemia Daughter   . Sickle cell anemia Son     Social History   Tobacco Use  . Smoking status: Never Smoker  . Smokeless tobacco: Never Used  Substance Use Topics  . Alcohol use: No    Subjective:    I connected with [name] on 07/15/20 at  1:00 PM EST by a video enabled telemedicine application and verified that I am speaking with the correct person using two identifiers.   I discussed the limitations of evaluation and management by telemedicine and the availability of in person appointments. The patient expressed understanding and agreed to proceed. Provider in office/ patient is at home; provider and patient are only 2 people on video call.   Patient has had 3 nosebleeds in the past week; has been the same nostril each time (right)- has been able to get it stopped within 5 minutes  each time; does use gas heat at home; not prone to nose bleeds previously; CBC checked at end of October was normal;      Objective:  There were no vitals filed for this visit.  General: Well developed, well nourished, in no acute distress  Head: Normocephalic and atraumatic  Lungs: Respirations unlabored;  Neurologic: Alert and oriented; speech intact; face symmetrical; moves all extremities well; CNII-XII intact without focal deficit   Assessment:  1. Frequent nosebleeds     Plan:  Reassurance given; suspect secondary to use of gas heat; recommend to try nasal spray and home humidifier; will refer to ENT and she will go if nose bleeds persist;      No follow-ups on file.  Orders Placed This Encounter  Procedures  . Ambulatory referral to ENT    Referral Priority:   Routine    Referral Type:   Consultation    Referral Reason:   Specialty Services Required    Requested Specialty:   Otolaryngology    Number of Visits Requested:   1    Requested  Prescriptions    No prescriptions requested or ordered in this encounter

## 2020-08-04 ENCOUNTER — Telehealth: Payer: 59 | Admitting: Internal Medicine

## 2020-08-15 DIAGNOSIS — R040 Epistaxis: Secondary | ICD-10-CM | POA: Insufficient documentation

## 2020-08-17 ENCOUNTER — Telehealth (INDEPENDENT_AMBULATORY_CARE_PROVIDER_SITE_OTHER): Payer: 59 | Admitting: Medical

## 2020-08-17 ENCOUNTER — Other Ambulatory Visit: Payer: Self-pay

## 2020-08-17 VITALS — Temp 98.2°F

## 2020-08-17 DIAGNOSIS — R059 Cough, unspecified: Secondary | ICD-10-CM

## 2020-08-17 DIAGNOSIS — U071 COVID-19: Secondary | ICD-10-CM | POA: Diagnosis not present

## 2020-08-17 DIAGNOSIS — R062 Wheezing: Secondary | ICD-10-CM

## 2020-08-17 MED ORDER — BENZONATATE 100 MG PO CAPS: 100.0000 mg | ORAL_CAPSULE | Freq: Three times a day (TID) | ORAL | 0 refills | Status: DC | PRN

## 2020-08-17 MED ORDER — ALBUTEROL SULFATE HFA 108 (90 BASE) MCG/ACT IN AERS: 2.0000 | INHALATION_SPRAY | Freq: Four times a day (QID) | RESPIRATORY_TRACT | 1 refills | Status: DC | PRN

## 2020-08-17 MED ORDER — AZITHROMYCIN 250 MG PO TABS
ORAL_TABLET | ORAL | 0 refills | Status: DC
Start: 2020-08-17 — End: 2020-12-07

## 2020-08-17 NOTE — Patient Instructions (Addendum)
Recent Covid positive test.  Got result back on Wednesday but actually was swabbed on Monday.  Patient has had initial series of Covid vaccines plus her booster.  Overall appears to be doing well but some caution advised the fact that she does have some reactive airway disease.  Advised patient she can get vitamin D over-the-counter and zinc over-the-counter.  Prescription of benzonatate made available for cough.  I did refill her albuterol inhaler.  Early in illness course.  I did decide to go ahead and make a azithromycin antibiotic available to use if needed as explained to patient.  Patient expressed understanding.  Advised patient to get O2 sat monitor check sats daily.  If O2 sats 94% or less definitely let us know.  Follow-up in 7 to 10 days or as needed.  Return to work date discussed.

## 2020-08-17 NOTE — Progress Notes (Signed)
Subjective:    Patient ID: Angel [name], female    DOB: 03/24/1976, 45 y.o.   MRN: [id number]  HPI   Virtual Visit via Video Note  I connected with Angel [name] on 08/17/20 at  1:20 PM EST by a video enabled telemedicine application and verified that I am speaking with the correct person using two identifiers.  Location: Patient: home Provider: office   I discussed the limitations of evaluation and management by telemedicine and the availability of in person appointments. The patient expressed understanding and agreed to proceed.  History of Present Illness:  Pt had dx of covid recently. She states since Aug 03, 2020 was sick. Pt went to UC and test was negative with rapid test.  Since 22 nd she was having cough, nasal congestion, runny nose and shortness and low grade fever. Pt has hx of reactive airways when gets sick in past. No respiratory symptoms for 2 years.   Pt does not have O2 sat monitor.  Pt states Monday test. Got result back today.  Pt has been vaccinated at boosted.  Pt if feeling feverish. She feels slight chest congested.    Observations/Objective:  General-no acute distress, pleasant, oriented. Lungs- on inspection lungs appear unlabored. Neck- no tracheal deviation or jvd on inspection. Neuro- gross motor function appears intact.    Assessment and Plan: Recent Covid positive test.  Got result back on Wednesday but actually was swabbed on Monday.  Patient has had initial series of Covid vaccines plus her booster.  Overall appears to be doing well but some caution advised the fact that she does have some reactive airway disease.  Advised patient she can get vitamin D over-the-counter and zinc over-the-counter.  Prescription of benzonatate made available for cough.  I did refill her albuterol inhaler.  Early in illness course.  I did decide to go ahead and make a azithromycin antibiotic available to use if needed as explained to patient.   Patient expressed understanding.  Advised patient to get O2 sat monitor check sats daily.  If O2 sats 94% or less definitely let us know.  Follow-up in 7 to 10 days or as needed.  Return to work date discussed.   Mackie Pai, PA-C   Time spent with patient today was 20   minutes which consisted of chart revdiew, discussing diagnosis, work up treatment and documentation. Follow Up Instructions:    I discussed the assessment and treatment plan with the patient. The patient was provided an opportunity to ask questions and all were answered. The patient agreed with the plan and demonstrated an understanding of the instructions.   The patient was advised to call back or seek an in-person evaluation if the symptoms worsen or if the condition fails to improve as anticipated.  Time spent with patient today was  20 minutes which consisted of chart revdiew, discussing diagnosis, work up treatment and documentation.   Mackie Pai, PA-C   Review of Systems  Constitutional: Negative for chills and fatigue.  HENT: Negative for dental problem.   Respiratory: Positive for cough and wheezing. Negative for chest tightness and shortness of breath.   Cardiovascular: Negative for chest pain and palpitations.  Gastrointestinal: Negative for abdominal pain.    Past Medical History:  Diagnosis Date  . Atypical squamous cells of undetermined significance (ASC-US) on cervical Pap smear 10/30/2019  . Bloating symptom 04/30/2012   9/13 2 hrs long cramping episodes associated w/diarrhea x 3 episodes R/o a viral illness, GS, pancreatitis  and other dx's 9/18 gluten free diet  . Breast lump 06/08/2013  . Breast mass 06/2013   bilateral  . Cardiac murmur 09/04/2019  . Cyst of right ovary 10/30/2019  . 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 DOE (dyspnea on exertion) 06/10/2019  . GERD (gastroesophageal reflux disease) 08/2014   takes Protonix daily  .  Herniated disc   . History of anemia 10/30/2019  . History of dysmenorrhea 10/30/2019  . History of hematuria 10/30/2019  . Insomnia 10/07/2013   Chronic   . Irritant dermatitis 11/17/2014  . Migraine headache   . [place] 10/30/2019  . Overweight 09/04/2019  . Sickle cell trait (HCC)   . Vitamin D deficiency 2009  . Vitamin D deficiency 09/03/2007   Chronic 2/14 Risks associated with treatment noncompliance were discussed. Compliance was encouraged.       Social History   Socioeconomic History  . Marital status: Married    Spouse name: Not on file  . Number of children: 2  . Years of education: Not on file  . Highest education level: Not on file  Occupational History  . Not on file  Tobacco Use  . Smoking status: Never Smoker  . Smokeless tobacco: Never Used  Substance and Sexual Activity  . Alcohol use: No  . Drug use: No  . Sexual activity: Not on file    Comment: BTL  Other Topics Concern  . Not on file  Social History Narrative  . Not on file   Social Determinants of Health   Financial Resource Strain: Not on file  Food Insecurity: Not on file  Transportation Needs: Not on file  Physical Activity: Not on file  Stress: Not on file  Social Connections: Not on file  Intimate Partner Violence: Not on file    Past Surgical History:  Procedure Laterality Date  . BILATERAL SALPINGECTOMY  09/26/2015   Procedure: BILATERAL SALPINGECTOMY;  Surgeon: Osborn Coho, MD;  Location: [facility];  Service: Gynecology;;  . BREAST BIOPSY Bilateral 07/13/2013   Procedure: BILATERAL EXCISION BREAS MASSES;  Surgeon: Shelly Rubenstein, MD;  Location: [facility];  Service: General;  Laterality: Bilateral;  . BREAST CYST EXCISION Left 2014   4 cysts removed  . DE QUERVAIN'S RELEASE Right 09/06/2003  . DILITATION & CURRETTAGE/HYSTROSCOPY WITH NOVASURE ABLATION N/A 09/26/2015   Procedure: DILATATION & CURETTAGE/HYSTEROSCOPY WITH NOVASURE ABLATION;  Surgeon: Osborn Coho,  MD;  Location: [facility];  Service: Gynecology;  Laterality: N/A;  . LAPAROSCOPIC OVARIAN CYSTECTOMY Right 09/26/2015   Procedure: LAPAROSCOPIC Right OVARIAN CYSTECTOMY, Bilateral Peritoneal Biopsies over Uteralsacral area;  Surgeon: Osborn Coho, MD;  Location: [facility];  Service: Gynecology;  Laterality: Right;  . LEFT HEART CATH AND CORONARY ANGIOGRAPHY N/A 12/30/2019   Procedure: LEFT HEART CATH AND CORONARY ANGIOGRAPHY;  Surgeon: Marykay Lex, MD;  Location: [facility];  Service: Cardiovascular;  Laterality: N/A;  . SPINAL FUSION  07/05/2018  . TUBAL LIGATION  12/27/2005  . WISDOM TOOTH EXTRACTION    . WRIST GANGLION EXCISION Right 09/06/2003  . WRIST GANGLION EXCISION Left 09/04/1999    Family History  Problem Relation Age of Onset  . Hypertension Father   . Diabetes Father   . Heart disease Father   . Cancer Father        prostate  . Sickle cell anemia Daughter   . Sickle cell anemia Son     Allergies  Allergen Reactions  . Other Dermatitis  Surgical glues Surgical glues  . Trazodone And Nefazodone   . Zolpidem     Ate in her sleep    Current Outpatient Medications on File Prior to Visit  Medication Sig Dispense Refill  . acetaminophen (TYLENOL) 500 MG tablet Take 1,000 mg by mouth daily.     Marland Kitchen aspirin EC 81 MG tablet Take 1 tablet (81 mg total) by mouth daily. 90 tablet 3  . betamethasone dipropionate 0.05 % cream Apply 1 application topically daily as needed (Eczema).     . celecoxib (CELEBREX) 200 MG capsule Take 1 capsule (200 mg total) by mouth daily as needed for moderate pain. 30 capsule 1  . Cholecalciferol (VITAMIN D3) 1.25 MG (50000 UT) CAPS Take 1 capsule by mouth once a week. 6 capsule 0  . Cholecalciferol (VITAMIN D3) 2000 units capsule Take 1 capsule (2,000 Units total) by mouth daily. (Patient taking differently: Take 1,000 Units by mouth daily.) 100 capsule 3  . clindamycin (CLINDAGEL) 1 % gel Apply 1 application topically daily as needed (acne).    11  . fluticasone (FLONASE) 50 MCG/ACT nasal spray Place 2 sprays into both nostrils daily. 16 g 6  . hydrocortisone (ANUSOL-HC) 2.5 % rectal cream Use bid (Patient taking differently: Place 1 application rectally 2 (two) times daily as needed for hemorrhoids.) 30 g 1  . hydrocortisone (ANUSOL-HC) 25 MG suppository Place 1 suppository (25 mg total) rectally 2 (two) times daily as needed for hemorrhoids or itching. 20 suppository 0  . hydrOXYzine (ATARAX/VISTARIL) 10 MG tablet Take 1-2 tablets after dinner for itch    . levocetirizine (XYZAL) 5 MG tablet Take 1 tablet (5 mg total) by mouth every evening. 30 tablet 1  . LORazepam (ATIVAN) 0.5 MG tablet Take 1-2 tablets (0.5-1 mg total) by mouth at bedtime as needed for anxiety or sleep. 60 tablet 3  . spironolactone (ALDACTONE) 50 MG tablet Take 50 mg by mouth daily.     . SUMAtriptan (IMITREX) 100 MG tablet Take 1 tablet (100 mg total) by mouth every 2 (two) hours as needed for migraine. May repeat in 2 hours if headache persists or recurs. 12 tablet 3  . tiZANidine (ZANAFLEX) 4 MG tablet Take 4 mg by mouth daily as needed (Back pain).     . nitroGLYCERIN (NITROSTAT) 0.4 MG SL tablet Place 1 tablet (0.4 mg total) under the tongue every 5 (five) minutes as needed for chest pain. 25 tablet 6   No current facility-administered medications on file prior to visit.    Temp 98.2 F (36.8 C) (Temporal)       Objective:   Physical Exam        Assessment & Plan:

## 2020-08-23 ENCOUNTER — Ambulatory Visit (INDEPENDENT_AMBULATORY_CARE_PROVIDER_SITE_OTHER)
Admission: RE | Admit: 2020-08-23 | Discharge: 2020-08-23 | Disposition: A | Discharge status: Home / Self Care | Payer: 59 | Source: Ambulatory Visit | Attending: Medical | Admitting: Medical

## 2020-08-23 ENCOUNTER — Other Ambulatory Visit: Payer: Self-pay

## 2020-08-23 ENCOUNTER — Telehealth: Payer: Self-pay | Admitting: Internal Medicine

## 2020-08-23 DIAGNOSIS — R059 Cough, unspecified: Secondary | ICD-10-CM

## 2020-08-23 NOTE — Telephone Encounter (Signed)
I put in chest xray order. I put location for [place] on [address]. Across from [address].

## 2020-08-23 NOTE — Telephone Encounter (Signed)
    Patient calling to report since virtual visit 1/5 her cough has gotten worse. She is requesting order for chest xray  Please advise

## 2020-08-23 NOTE — Telephone Encounter (Signed)
° °  Patient made aware order for chest xray in [facility]

## 2020-10-14 ENCOUNTER — Other Ambulatory Visit: Payer: Self-pay | Admitting: Medical

## 2020-10-27 ENCOUNTER — Ambulatory Visit: Payer: 59 | Admitting: Internal Medicine

## 2020-10-27 ENCOUNTER — Encounter: Payer: Self-pay | Admitting: Internal Medicine

## 2020-10-27 ENCOUNTER — Other Ambulatory Visit: Payer: Self-pay

## 2020-10-27 ENCOUNTER — Ambulatory Visit (INDEPENDENT_AMBULATORY_CARE_PROVIDER_SITE_OTHER): Payer: 59

## 2020-10-27 VITALS — BP 118/74 | HR 64 | Temp 98.0°F | Resp 16 | Ht 66.0 in | Wt 208.0 lb

## 2020-10-27 DIAGNOSIS — M8949 Other hypertrophic osteoarthropathy, multiple sites: Secondary | ICD-10-CM | POA: Diagnosis not present

## 2020-10-27 DIAGNOSIS — G8929 Other chronic pain: Secondary | ICD-10-CM

## 2020-10-27 DIAGNOSIS — M159 Polyosteoarthritis, unspecified: Secondary | ICD-10-CM | POA: Insufficient documentation

## 2020-10-27 DIAGNOSIS — M25511 Pain in right shoulder: Secondary | ICD-10-CM

## 2020-10-27 MED ORDER — ETODOLAC ER 400 MG PO TB24: 400.0000 mg | ORAL_TABLET | Freq: Every day | ORAL | 0 refills | Status: DC

## 2020-10-27 NOTE — Patient Instructions (Signed)
Shoulder Pain Many things can cause shoulder pain, including:  An injury to the shoulder.  Overuse of the shoulder.  Arthritis. The source of the pain can be:  Inflammation.  An injury to the shoulder joint.  An injury to a tendon, ligament, or bone. Follow these instructions at home: Pay attention to changes in your symptoms. Let your health care provider know about them. Follow these instructions to relieve your pain. If you have a sling:  Wear the sling as told by your health care provider. Remove it only as told by your health care provider.  Loosen the sling if your fingers tingle, become numb, or turn cold and blue.  Keep the sling clean.  If the sling is not waterproof: ? Do not let it get wet. Remove it to shower or bathe.  Move your arm as little as possible, but keep your hand moving to prevent swelling. Managing pain, stiffness, and swelling  If directed, put ice on the painful area: ? Put ice in a plastic bag. ? Place a towel between your skin and the bag. ? Leave the ice on for 20 minutes, 2-3 times per day. Stop applying ice if it does not help with the pain.  Squeeze a soft ball or a foam pad as much as possible. This helps to keep the shoulder from swelling. It also helps to strengthen the arm.   General instructions  Take over-the-counter and prescription medicines only as told by your health care provider.  Keep all follow-up visits as told by your health care provider. This is important. Contact a health care provider if:  Your pain gets worse.  Your pain is not relieved with medicines.  New pain develops in your arm, hand, or fingers. Get help right away if:  Your arm, hand, or fingers: ? Tingle. ? Become numb. ? Become swollen. ? Become painful. ? Turn white or blue. Summary  Shoulder pain can be caused by an injury, overuse, or arthritis.  Pay attention to changes in your symptoms. Let your health care provider know about  them.  This condition may be treated with a sling, ice, and pain medicines.  Contact your health care provider if the pain gets worse or new pain develops. Get help right away if your arm, hand, or fingers tingle or become numb, swollen, or painful.  Keep all follow-up visits as told by your health care provider. This is important. This information is not intended to replace advice given to you by your health care provider. Make sure you discuss any questions you have with your health care provider. Document Revised: 02/11/2018 Document Reviewed: 02/11/2018 Elsevier Patient Education  2021 Elsevier Inc.  

## 2020-10-27 NOTE — Progress Notes (Unsigned)
Subjective:  Patient ID: Angel [name], female    DOB: [birth year]  Age: 45 y.o. MRN: [id number]  CC: Osteoarthritis  This visit occurred during the SARS-CoV-2 public health emergency.  Safety protocols were in place, including screening questions prior to the visit, additional usage of staff PPE, and extensive cleaning of exam room while observing appropriate contact time as indicated for disinfecting solutions.    HPI MARKA TRELOAR presents for f/up - She complains of a 1 month history of pain in her right shoulder.  She sustained a fall a year and a half ago but has had no injuries since then.  She also has a painful and enlarged joint at the base of her right thumb.  She has chronic neck pain and recently received an epidural steroid injection.  She denies paresthesias.  Outpatient Medications Prior to Visit  Medication Sig Dispense Refill  . acetaminophen (TYLENOL) 500 MG tablet Take 1,000 mg by mouth daily.     Marland Kitchen albuterol (VENTOLIN HFA) 108 (90 Base) MCG/ACT inhaler INHALE 2 PUFFS INTO THE LUNGS EVERY 6 HOURS AS NEEDED FOR WHEEZING OR SHORTNESS OF BREATH 6.7 g 3  . aspirin EC 81 MG tablet Take 1 tablet (81 mg total) by mouth daily. 90 tablet 3  . azithromycin (ZITHROMAX) 250 MG tablet Take 2 tablets by mouth on day 1, followed by 1 tablet by mouth daily for 4 days. 6 tablet 0  . benzonatate (TESSALON) 100 MG capsule Take 1 capsule (100 mg total) by mouth 3 (three) times daily as needed for cough. 30 capsule 0  . betamethasone dipropionate 0.05 % cream Apply 1 application topically daily as needed (Eczema).     . Cholecalciferol (VITAMIN D3) 1.25 MG (50000 UT) CAPS Take 1 capsule by mouth once a week. 6 capsule 0  . Cholecalciferol (VITAMIN D3) 2000 units capsule Take 1 capsule (2,000 Units total) by mouth daily. (Patient taking differently: Take 1,000 Units by mouth daily.) 100 capsule 3  . clindamycin (CLINDAGEL) 1 % gel Apply 1 application topically daily as needed (acne).   11   . fluticasone (FLONASE) 50 MCG/ACT nasal spray Place 2 sprays into both nostrils daily. 16 g 6  . hydrocortisone (ANUSOL-HC) 2.5 % rectal cream Use bid (Patient taking differently: Place 1 application rectally 2 (two) times daily as needed for hemorrhoids.) 30 g 1  . hydrocortisone (ANUSOL-HC) 25 MG suppository Place 1 suppository (25 mg total) rectally 2 (two) times daily as needed for hemorrhoids or itching. 20 suppository 0  . hydrOXYzine (ATARAX/VISTARIL) 10 MG tablet Take 1-2 tablets after dinner for itch    . levocetirizine (XYZAL) 5 MG tablet Take 1 tablet (5 mg total) by mouth every evening. 30 tablet 1  . LORazepam (ATIVAN) 0.5 MG tablet Take 1-2 tablets (0.5-1 mg total) by mouth at bedtime as needed for anxiety or sleep. 60 tablet 3  . spironolactone (ALDACTONE) 50 MG tablet Take 50 mg by mouth daily.     . SUMAtriptan (IMITREX) 100 MG tablet Take 1 tablet (100 mg total) by mouth every 2 (two) hours as needed for migraine. May repeat in 2 hours if headache persists or recurs. 12 tablet 3  . tiZANidine (ZANAFLEX) 4 MG tablet Take 4 mg by mouth daily as needed (Back pain).     . celecoxib (CELEBREX) 200 MG capsule Take 1 capsule (200 mg total) by mouth daily as needed for moderate pain. 30 capsule 1  . nitroGLYCERIN (NITROSTAT) 0.4 MG SL tablet Place 1  tablet (0.4 mg total) under the tongue every 5 (five) minutes as needed for chest pain. 25 tablet 6   No facility-administered medications prior to visit.    ROS Review of Systems  Constitutional: Negative.  Negative for chills, diaphoresis, fatigue and fever.  HENT: Negative.   Eyes: Negative.   Respiratory: Negative for cough, chest tightness, shortness of breath and wheezing.   Cardiovascular: Negative for chest pain, palpitations and leg swelling.  Gastrointestinal: Negative for abdominal pain, constipation, diarrhea, nausea and vomiting.  Endocrine: Negative.   Genitourinary: Negative.  Negative for difficulty urinating.   Musculoskeletal: Positive for arthralgias and neck pain. Negative for back pain and myalgias.       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   Neurological: Negative.  Negative for weakness.  Hematological: Negative for adenopathy. Does not bruise/bleed easily.  Psychiatric/Behavioral: Negative.     Objective:  BP 118/74   Pulse 64   Temp 98 F (36.7 C) (Oral)   Resp 16   Ht 5\' 6"  (1.676 m)   Wt 208 lb (94.3 kg)   SpO2 97%   BMI 33.57 kg/m   BP Readings from Last 3 Encounters:  10/27/20 118/74  06/08/20 112/70  02/03/20 114/68    Wt Readings from Last 3 Encounters:  10/27/20 208 lb (94.3 kg)  06/08/20 205 lb 6.4 oz (93.2 kg)  02/03/20 209 lb (94.8 kg)    Physical Exam Vitals reviewed.  Constitutional:      Appearance: Normal appearance.  HENT:     Nose: Nose normal.     Mouth/Throat:     Pharynx: Oropharynx is clear.  Eyes:     General: No scleral icterus.    Conjunctiva/sclera: Conjunctivae normal.  Cardiovascular:     Rate and Rhythm: Normal rate and regular rhythm.     Pulses: Normal pulses.     Heart sounds: No murmur heard.   Pulmonary:     Effort: Pulmonary effort is normal.     Breath sounds: No stridor. No wheezing, rhonchi or rales.  Abdominal:     General: Abdomen is flat.     Palpations: There is no mass.     Tenderness: There is no abdominal tenderness.  Musculoskeletal:        General: No swelling. Normal range of motion.     Right shoulder: Normal. No swelling, deformity, tenderness or bony tenderness. Normal range of motion.     Left shoulder: Normal.     Cervical back: Neck supple.     Right lower leg: No edema.     Left lower leg: No edema.  Lymphadenopathy:     Cervical: No cervical adenopathy.  Skin:    General: Skin is warm and dry.  Neurological:     General: No focal deficit present.     Mental Status: She is alert and oriented to person, place, and time. Mental status is at baseline.  Psychiatric:        Mood and Affect:  Mood normal.        Behavior: Behavior normal.     Lab Results  Component Value Date   WBC 4.8 06/08/2020   HGB 13.5 06/08/2020   HCT 41.1 06/08/2020   PLT 266.0 06/08/2020   GLUCOSE 92 06/08/2020   CHOL 206 (H) 06/08/2020   TRIG 65.0 06/08/2020   HDL 83.80 06/08/2020   LDLCALC 109 (H) 06/08/2020   ALT 16 06/08/2020   AST 16 06/08/2020   NA 140 06/08/2020   K  3.5 06/08/2020   CL 102 06/08/2020   CREATININE 0.84 06/08/2020   BUN 12 06/08/2020   CO2 29 06/08/2020   TSH 2.68 06/08/2020   HGBA1C 5.6 10/18/2014    DG Chest 2 View  Result Date: 08/23/2020 CLINICAL DATA:  Cough.  History of COVID. EXAM: CHEST - 2 VIEW COMPARISON:  12/25/2019 FINDINGS: The lungs are clear without focal pneumonia, edema, pneumothorax or pleural effusion. The cardiopericardial silhouette is within normal limits for size. Thoracolumbar scoliosis evident. IMPRESSION: No active cardiopulmonary disease. Electronically Signed   By: Kennith Center M.D.   On: 08/23/2020 11:28   CLINICAL DATA:  Right shoulder pain for a month.  No injury.  EXAM: RIGHT SHOULDER - 2+ VIEW  COMPARISON:  None.  FINDINGS: There is no evidence of fracture or dislocation. There is no evidence of arthropathy or other focal bone abnormality. Soft tissues are unremarkable.  IMPRESSION: Negative.   Electronically Signed   By: Gerome Sam III M.D   On: 10/29/2020 08:10   Assessment & Plan:   Cheyla was seen today for osteoarthritis.  Diagnoses and all orders for this visit:  Chronic right shoulder pain- Will treat for OA. -     DG Shoulder Right; Future  Primary osteoarthritis involving multiple joints -     etodolac (LODINE XL) 400 MG 24 hr tablet; Take 1 tablet (400 mg total) by mouth daily.   I have discontinued Sheetal H. Toepfer's celecoxib. I am also having her start on etodolac. Additionally, I am having her maintain her clindamycin, hydrocortisone, Vitamin D3, SUMAtriptan, spironolactone,  acetaminophen, hydrocortisone, fluticasone, tiZANidine, betamethasone dipropionate, nitroGLYCERIN, aspirin EC, hydrOXYzine, levocetirizine, LORazepam, Vitamin D3, benzonatate, azithromycin, and albuterol.  Meds ordered this encounter  Medications  . etodolac (LODINE XL) 400 MG 24 hr tablet    Sig: Take 1 tablet (400 mg total) by mouth daily.    Dispense:  90 tablet    Refill:  0     Follow-up: Return in about 3 months (around 01/27/2021).  Sanda Linger, MD

## 2020-12-07 ENCOUNTER — Ambulatory Visit: Payer: 59 | Admitting: Internal Medicine

## 2020-12-07 ENCOUNTER — Other Ambulatory Visit: Payer: Self-pay

## 2020-12-07 ENCOUNTER — Encounter: Payer: Self-pay | Admitting: Internal Medicine

## 2020-12-07 VITALS — BP 112/68 | HR 69 | Temp 98.5°F | Wt 209.8 lb

## 2020-12-07 DIAGNOSIS — E538 Deficiency of other specified B group vitamins: Secondary | ICD-10-CM

## 2020-12-07 DIAGNOSIS — E559 Vitamin D deficiency, unspecified: Secondary | ICD-10-CM

## 2020-12-07 DIAGNOSIS — R5383 Other fatigue: Secondary | ICD-10-CM | POA: Diagnosis not present

## 2020-12-07 DIAGNOSIS — B028 Zoster with other complications: Secondary | ICD-10-CM

## 2020-12-07 DIAGNOSIS — B029 Zoster without complications: Secondary | ICD-10-CM | POA: Insufficient documentation

## 2020-12-07 LAB — COMPREHENSIVE METABOLIC PANEL
ALT: 13 U/L (ref 0–35)
AST: 14 U/L (ref 0–37)
Albumin: 3.8 g/dL (ref 3.5–5.2)
Alkaline Phosphatase: 47 U/L (ref 39–117)
BUN: 14 mg/dL (ref 6–23)
CO2: 27 mEq/L (ref 19–32)
Calcium: 8.9 mg/dL (ref 8.4–10.5)
Chloride: 106 mEq/L (ref 96–112)
Creatinine, Ser: 0.96 mg/dL (ref 0.40–1.20)
GFR: 71.91 mL/min (ref >60.00)
Glucose, Bld: 92 mg/dL (ref 70–99)
Potassium: 4 mEq/L (ref 3.5–5.1)
Sodium: 139 mEq/L (ref 135–145)
Total Bilirubin: 0.3 mg/dL (ref 0.2–1.2)
Total Protein: 6.5 g/dL (ref 6.0–8.3)

## 2020-12-07 LAB — CBC WITH DIFFERENTIAL/PLATELET
Basophils Absolute: 0.1 10*3/uL (ref 0.0–0.1)
Basophils Relative: 1.1 % (ref 0.0–3.0)
Eosinophils Absolute: 0.1 10*3/uL (ref 0.0–0.7)
Eosinophils Relative: 1.5 % (ref 0.0–5.0)
HCT: 36.4 % (ref 36.0–46.0)
Hemoglobin: 12.3 g/dL (ref 12.0–15.0)
Lymphocytes Relative: 31.6 % (ref 12.0–46.0)
Lymphs Abs: 1.8 10*3/uL (ref 0.7–4.0)
MCHC: 33.8 g/dL (ref 30.0–36.0)
MCV: 89.5 fl (ref 78.0–100.0)
Monocytes Absolute: 0.4 10*3/uL (ref 0.1–1.0)
Monocytes Relative: 7.3 % (ref 3.0–12.0)
Neutro Abs: 3.3 10*3/uL (ref 1.4–7.7)
Neutrophils Relative %: 58.5 % (ref 43.0–77.0)
Platelets: 275 10*3/uL (ref 150.0–400.0)
RBC: 4.07 Mil/uL (ref 3.87–5.11)
RDW: 13.6 % (ref 11.5–15.5)
WBC: 5.6 10*3/uL (ref 4.0–10.5)

## 2020-12-07 LAB — VITAMIN D 25 HYDROXY (VIT D DEFICIENCY, FRACTURES): VITD: 21.31 ng/mL — ABNORMAL LOW (ref 30.00–100.00)

## 2020-12-07 LAB — TSH: TSH: 1.21 u[IU]/mL (ref 0.35–4.50)

## 2020-12-07 LAB — VITAMIN B12: Vitamin B-12: 369 pg/mL (ref 211–911)

## 2020-12-07 MED ORDER — LORAZEPAM 0.5 MG PO TABS: 0.5000 mg | ORAL_TABLET | Freq: Every evening | ORAL | 3 refills | Status: DC | PRN

## 2020-12-07 MED ORDER — VALACYCLOVIR HCL 1 G PO TABS: 1000.0000 mg | ORAL_TABLET | Freq: Three times a day (TID) | ORAL | 0 refills | Status: DC

## 2020-12-07 NOTE — Progress Notes (Signed)
Subjective:  Patient ID: Angel [name], female    DOB: 01/27/1976  Age: 45 y.o. MRN: [id number]  CC: Follow-up (6 month f/u)   HPI Angel [name] presents for remote shingles, anxiety, R shoulder pain - on Etodolac f/u C/o snoring - bad, no OSA per pain   Outpatient Medications Prior to Visit  Medication Sig Dispense Refill  . acetaminophen (TYLENOL) 500 MG tablet Take 1,000 mg by mouth daily.     Marland Kitchen albuterol (VENTOLIN HFA) 108 (90 Base) MCG/ACT inhaler INHALE 2 PUFFS INTO THE LUNGS EVERY 6 HOURS AS NEEDED FOR WHEEZING OR SHORTNESS OF BREATH 6.7 g 3  . aspirin EC 81 MG tablet Take 1 tablet (81 mg total) by mouth daily. 90 tablet 3  . azithromycin (ZITHROMAX) 250 MG tablet Take 2 tablets by mouth on day 1, followed by 1 tablet by mouth daily for 4 days. 6 tablet 0  . benzonatate (TESSALON) 100 MG capsule Take 1 capsule (100 mg total) by mouth 3 (three) times daily as needed for cough. 30 capsule 0  . betamethasone dipropionate 0.05 % cream Apply 1 application topically daily as needed (Eczema).     . Cholecalciferol (VITAMIN D3) 1.25 MG (50000 UT) CAPS Take 1 capsule by mouth once a week. 6 capsule 0  . Cholecalciferol (VITAMIN D3) 2000 units capsule Take 1 capsule (2,000 Units total) by mouth daily. (Patient taking differently: Take 1,000 Units by mouth daily.) 100 capsule 3  . clindamycin (CLINDAGEL) 1 % gel Apply 1 application topically daily as needed (acne).   11  . etodolac (LODINE XL) 400 MG 24 hr tablet Take 1 tablet (400 mg total) by mouth daily. 90 tablet 0  . fluticasone (FLONASE) 50 MCG/ACT nasal spray Place 2 sprays into both nostrils daily. 16 g 6  . hydrocortisone (ANUSOL-HC) 2.5 % rectal cream Use bid (Patient taking differently: Place 1 application rectally 2 (two) times daily as needed for hemorrhoids.) 30 g 1  . hydrocortisone (ANUSOL-HC) 25 MG suppository Place 1 suppository (25 mg total) rectally 2 (two) times daily as needed for hemorrhoids or itching. 20  suppository 0  . hydrOXYzine (ATARAX/VISTARIL) 10 MG tablet Take 1-2 tablets after dinner for itch    . levocetirizine (XYZAL) 5 MG tablet Take 1 tablet (5 mg total) by mouth every evening. 30 tablet 1  . LORazepam (ATIVAN) 0.5 MG tablet Take 1-2 tablets (0.5-1 mg total) by mouth at bedtime as needed for anxiety or sleep. 60 tablet 3  . spironolactone (ALDACTONE) 50 MG tablet Take 50 mg by mouth daily.     . SUMAtriptan (IMITREX) 100 MG tablet Take 1 tablet (100 mg total) by mouth every 2 (two) hours as needed for migraine. May repeat in 2 hours if headache persists or recurs. 12 tablet 3  . tiZANidine (ZANAFLEX) 4 MG tablet Take 4 mg by mouth daily as needed (Back pain).     . nitroGLYCERIN (NITROSTAT) 0.4 MG SL tablet Place 1 tablet (0.4 mg total) under the tongue every 5 (five) minutes as needed for chest pain. 25 tablet 6   No facility-administered medications prior to visit.    ROS: Review of Systems  Constitutional: Positive for fatigue. Negative for activity change, appetite change, chills and unexpected weight change.  HENT: Negative for congestion, mouth sores and sinus pressure.   Eyes: Negative for visual disturbance.  Respiratory: Negative for cough and chest tightness.   Gastrointestinal: Negative for abdominal pain and nausea.  Genitourinary: Negative for difficulty urinating, frequency  and vaginal pain.  Musculoskeletal: Negative for back pain and gait problem.  Skin: Negative for pallor and rash.  Neurological: Negative for dizziness, tremors, weakness, numbness and headaches.  Psychiatric/Behavioral: Negative for confusion and sleep disturbance.  snoring  Objective:  BP 112/68 (BP Location: Left Arm)   Pulse 69   Temp 98.5 F (36.9 C) (Oral)   Wt 209 lb 12.8 oz (95.2 kg)   SpO2 98%   BMI 33.86 kg/m   BP Readings from Last 3 Encounters:  12/07/20 112/68  10/27/20 118/74  06/08/20 112/70    Wt Readings from Last 3 Encounters:  12/07/20 209 lb 12.8 oz (95.2  kg)  10/27/20 208 lb (94.3 kg)  06/08/20 205 lb 6.4 oz (93.2 kg)    Physical Exam Constitutional:      General: She is not in acute distress.    Appearance: She is well-developed. She is obese.  HENT:     Head: Normocephalic.     Right Ear: External ear normal.     Left Ear: External ear normal.     Nose: Nose normal.  Eyes:     General:        Right eye: No discharge.        Left eye: No discharge.     Conjunctiva/sclera: Conjunctivae normal.     Pupils: Pupils are equal, round, and reactive to light.  Neck:     Thyroid: No thyromegaly.     Vascular: No JVD.     Trachea: No tracheal deviation.  Cardiovascular:     Rate and Rhythm: Normal rate and regular rhythm.     Heart sounds: Normal heart sounds.  Pulmonary:     Effort: No respiratory distress.     Breath sounds: No stridor. No wheezing.  Abdominal:     General: Bowel sounds are normal. There is no distension.     Palpations: Abdomen is soft. There is no mass.     Tenderness: There is no abdominal tenderness. There is no guarding or rebound.  Musculoskeletal:        General: No tenderness.     Cervical back: Normal range of motion and neck supple.  Lymphadenopathy:     Cervical: No cervical adenopathy.  Skin:    Findings: No erythema or rash.  Neurological:     Mental Status: She is oriented to person, place, and time.     Cranial Nerves: No cranial nerve deficit.     Motor: No abnormal muscle tone.     Coordination: Coordination normal.     Deep Tendon Reflexes: Reflexes normal.  Psychiatric:        Behavior: Behavior normal.        Thought Content: Thought content normal.        Judgment: Judgment normal.     Lab Results  Component Value Date   WBC 4.8 06/08/2020   HGB 13.5 06/08/2020   HCT 41.1 06/08/2020   PLT 266.0 06/08/2020   GLUCOSE 92 06/08/2020   CHOL 206 (H) 06/08/2020   TRIG 65.0 06/08/2020   HDL 83.80 06/08/2020   LDLCALC 109 (H) 06/08/2020   ALT 16 06/08/2020   AST 16 06/08/2020    NA 140 06/08/2020   K 3.5 06/08/2020   CL 102 06/08/2020   CREATININE 0.84 06/08/2020   BUN 12 06/08/2020   CO2 29 06/08/2020   TSH 2.68 06/08/2020   HGBA1C 5.6 10/18/2014    DG Chest 2 View  Result Date: 08/23/2020 CLINICAL DATA:  Cough.  History of COVID. EXAM: CHEST - 2 VIEW COMPARISON:  12/25/2019 FINDINGS: The lungs are clear without focal pneumonia, edema, pneumothorax or pleural effusion. The cardiopericardial silhouette is within normal limits for size. Thoracolumbar scoliosis evident. IMPRESSION: No active cardiopulmonary disease. Electronically Signed   By: Kennith Center M.D.   On: 08/23/2020 11:28    Assessment & Plan:   There are no diagnoses linked to this encounter.   No orders of the defined types were placed in this encounter.    Follow-up: No follow-ups on file.  Sonda Primes, MD

## 2020-12-07 NOTE — Assessment & Plan Note (Signed)
Remote Valtrex if relapsed

## 2020-12-22 ENCOUNTER — Telehealth: Payer: Self-pay | Admitting: Internal Medicine

## 2020-12-22 MED ORDER — SUMATRIPTAN SUCCINATE 100 MG PO TABS: 100.0000 mg | ORAL_TABLET | ORAL | 1 refills | Status: DC | PRN

## 2020-12-22 NOTE — Telephone Encounter (Signed)
   Patient reports migraine for 2 days Requesting new order for SUMAtriptan (IMITREX) 100 MG tablet be called to [facility] - [place], [place] - [address]

## 2020-12-22 NOTE — Telephone Encounter (Signed)
Reviewed chart pt is up-to-date sent refills to requested pharmacy.../lmb  

## 2021-01-11 DIAGNOSIS — H40023 Open angle with borderline findings, high risk, bilateral: Secondary | ICD-10-CM | POA: Diagnosis not present

## 2021-01-11 DIAGNOSIS — H2513 Age-related nuclear cataract, bilateral: Secondary | ICD-10-CM | POA: Diagnosis not present

## 2021-01-11 DIAGNOSIS — H5319 Other subjective visual disturbances: Secondary | ICD-10-CM | POA: Diagnosis not present

## 2021-01-11 DIAGNOSIS — H10413 Chronic giant papillary conjunctivitis, bilateral: Secondary | ICD-10-CM | POA: Diagnosis not present

## 2021-01-16 ENCOUNTER — Other Ambulatory Visit: Payer: Self-pay | Admitting: Internal Medicine

## 2021-01-16 DIAGNOSIS — M8949 Other hypertrophic osteoarthropathy, multiple sites: Secondary | ICD-10-CM

## 2021-01-16 DIAGNOSIS — M159 Polyosteoarthritis, unspecified: Secondary | ICD-10-CM

## 2021-02-07 DIAGNOSIS — Z01419 Encounter for gynecological examination (general) (routine) without abnormal findings: Secondary | ICD-10-CM | POA: Diagnosis not present

## 2021-02-07 DIAGNOSIS — Z6833 Body mass index (BMI) 33.0-33.9, adult: Secondary | ICD-10-CM | POA: Diagnosis not present

## 2021-02-07 DIAGNOSIS — Z1231 Encounter for screening mammogram for malignant neoplasm of breast: Secondary | ICD-10-CM | POA: Diagnosis not present

## 2021-02-07 DIAGNOSIS — N6320 Unspecified lump in the left breast, unspecified quadrant: Secondary | ICD-10-CM | POA: Diagnosis not present

## 2021-03-08 DIAGNOSIS — M4726 Other spondylosis with radiculopathy, lumbar region: Secondary | ICD-10-CM | POA: Diagnosis not present

## 2021-03-08 DIAGNOSIS — Z981 Arthrodesis status: Secondary | ICD-10-CM | POA: Diagnosis not present

## 2021-03-09 DIAGNOSIS — Z20822 Contact with and (suspected) exposure to covid-19: Secondary | ICD-10-CM | POA: Diagnosis not present

## 2021-03-20 DIAGNOSIS — N6320 Unspecified lump in the left breast, unspecified quadrant: Secondary | ICD-10-CM | POA: Diagnosis not present

## 2021-03-20 DIAGNOSIS — R928 Other abnormal and inconclusive findings on diagnostic imaging of breast: Secondary | ICD-10-CM | POA: Diagnosis not present

## 2021-03-22 DIAGNOSIS — M5136 Other intervertebral disc degeneration, lumbar region: Secondary | ICD-10-CM | POA: Diagnosis not present

## 2021-03-22 DIAGNOSIS — M47816 Spondylosis without myelopathy or radiculopathy, lumbar region: Secondary | ICD-10-CM | POA: Diagnosis not present

## 2021-03-28 DIAGNOSIS — L700 Acne vulgaris: Secondary | ICD-10-CM | POA: Diagnosis not present

## 2021-03-28 DIAGNOSIS — L732 Hidradenitis suppurativa: Secondary | ICD-10-CM | POA: Diagnosis not present

## 2021-03-28 DIAGNOSIS — Z5181 Encounter for therapeutic drug level monitoring: Secondary | ICD-10-CM | POA: Diagnosis not present

## 2021-04-18 DIAGNOSIS — Z20822 Contact with and (suspected) exposure to covid-19: Secondary | ICD-10-CM | POA: Diagnosis not present

## 2021-05-03 DIAGNOSIS — M47816 Spondylosis without myelopathy or radiculopathy, lumbar region: Secondary | ICD-10-CM | POA: Diagnosis not present

## 2021-05-03 DIAGNOSIS — M5136 Other intervertebral disc degeneration, lumbar region: Secondary | ICD-10-CM | POA: Diagnosis not present

## 2021-05-03 DIAGNOSIS — M4722 Other spondylosis with radiculopathy, cervical region: Secondary | ICD-10-CM | POA: Diagnosis not present

## 2021-05-03 DIAGNOSIS — M4726 Other spondylosis with radiculopathy, lumbar region: Secondary | ICD-10-CM | POA: Diagnosis not present

## 2021-05-24 DIAGNOSIS — M5459 Other low back pain: Secondary | ICD-10-CM | POA: Diagnosis not present

## 2021-05-24 DIAGNOSIS — M47816 Spondylosis without myelopathy or radiculopathy, lumbar region: Secondary | ICD-10-CM | POA: Diagnosis not present

## 2021-06-02 DIAGNOSIS — K5730 Diverticulosis of large intestine without perforation or abscess without bleeding: Secondary | ICD-10-CM | POA: Diagnosis not present

## 2021-06-02 DIAGNOSIS — K648 Other hemorrhoids: Secondary | ICD-10-CM | POA: Diagnosis not present

## 2021-06-02 DIAGNOSIS — Z1211 Encounter for screening for malignant neoplasm of colon: Secondary | ICD-10-CM | POA: Diagnosis not present

## 2021-06-14 ENCOUNTER — Encounter: Payer: Self-pay | Admitting: Internal Medicine

## 2021-06-14 ENCOUNTER — Ambulatory Visit (INDEPENDENT_AMBULATORY_CARE_PROVIDER_SITE_OTHER): Payer: BC Managed Care – PPO | Admitting: Internal Medicine

## 2021-06-14 ENCOUNTER — Other Ambulatory Visit: Payer: Self-pay

## 2021-06-14 DIAGNOSIS — Z862 Personal history of diseases of the blood and blood-forming organs and certain disorders involving the immune mechanism: Secondary | ICD-10-CM

## 2021-06-14 DIAGNOSIS — E663 Overweight: Secondary | ICD-10-CM

## 2021-06-14 DIAGNOSIS — G4700 Insomnia, unspecified: Secondary | ICD-10-CM

## 2021-06-14 DIAGNOSIS — E559 Vitamin D deficiency, unspecified: Secondary | ICD-10-CM

## 2021-06-14 DIAGNOSIS — R5383 Other fatigue: Secondary | ICD-10-CM

## 2021-06-14 MED ORDER — LORAZEPAM 1 MG PO TABS: 1.0000 mg | ORAL_TABLET | Freq: Every evening | ORAL | 3 refills | Status: DC | PRN

## 2021-06-14 MED ORDER — VITAMIN D (ERGOCALCIFEROL) 1.25 MG (50000 UNIT) PO CAPS: 50000.0000 [IU] | ORAL_CAPSULE | ORAL | 3 refills | Status: DC

## 2021-06-14 NOTE — Assessment & Plan Note (Signed)
Lorazepam prn 

## 2021-06-14 NOTE — Progress Notes (Signed)
Subjective:  Patient ID: Angel [name], female    DOB: [birth year]  Age: 45 y.o. MRN: [id number]  CC: Follow-up (6 month f/u)   HPI KENNETHIA LYKES presents for 98-month follow-up to address insomnia, vitamin D deficiency, weight gain.  The patient continues to feel tired  Outpatient Medications Prior to Visit  Medication Sig Dispense Refill   acetaminophen (TYLENOL) 500 MG tablet Take 1,000 mg by mouth daily.      albuterol (VENTOLIN HFA) 108 (90 Base) MCG/ACT inhaler INHALE 2 PUFFS INTO THE LUNGS EVERY 6 HOURS AS NEEDED FOR WHEEZING OR SHORTNESS OF BREATH 6.7 g 3   aspirin EC 81 MG tablet Take 1 tablet (81 mg total) by mouth daily. 90 tablet 3   betamethasone dipropionate 0.05 % cream Apply 1 application topically daily as needed (Eczema).      clindamycin (CLINDAGEL) 1 % gel Apply 1 application topically daily as needed (acne).   11   etodolac (LODINE XL) 400 MG 24 hr tablet Take 1 tablet (400 mg total) by mouth daily. 90 tablet 0   fluticasone (FLONASE) 50 MCG/ACT nasal spray Place 2 sprays into both nostrils daily. 16 g 6   gabapentin (NEURONTIN) 100 MG capsule Take by mouth. Take 1 By mouth twice a day     hydrocortisone (ANUSOL-HC) 2.5 % rectal cream Use bid (Patient taking differently: Place 1 application rectally 2 (two) times daily as needed for hemorrhoids.) 30 g 1   hydrOXYzine (ATARAX/VISTARIL) 10 MG tablet Take 1-2 tablets after dinner for itch     levocetirizine (XYZAL) 5 MG tablet Take 1 tablet (5 mg total) by mouth every evening. 30 tablet 1   meloxicam (MOBIC) 7.5 MG tablet Take 1 by mouth daily     nitroGLYCERIN (NITROSTAT) 0.4 MG SL tablet Place 1 tablet (0.4 mg total) under the tongue every 5 (five) minutes as needed for chest pain. 25 tablet 6   spironolactone (ALDACTONE) 50 MG tablet Take 50 mg by mouth daily.      SUMAtriptan (IMITREX) 100 MG tablet Take 1 tablet (100 mg total) by mouth every 2 (two) hours as needed for migraine. May repeat in 2 hours if headache  persists or recurs. 12 tablet 1   tiZANidine (ZANAFLEX) 4 MG tablet Take 4 mg by mouth daily as needed (Back pain).      valACYclovir (VALTREX) 1000 MG tablet Take 1 tablet (1,000 mg total) by mouth 3 (three) times daily. 21 tablet 0   benzonatate (TESSALON) 100 MG capsule Take 1 capsule (100 mg total) by mouth 3 (three) times daily as needed for cough. (Patient not taking: Reported on 06/14/2021) 30 capsule 0   Cholecalciferol (VITAMIN D3) 1.25 MG (50000 UT) CAPS Take 1 capsule by mouth once a week. (Patient not taking: Reported on 06/14/2021) 6 capsule 0   Cholecalciferol (VITAMIN D3) 2000 units capsule Take 1 capsule (2,000 Units total) by mouth daily. (Patient taking differently: Take 1,000 Units by mouth daily.) 100 capsule 3   hydrocortisone (ANUSOL-HC) 25 MG suppository Place 1 suppository (25 mg total) rectally 2 (two) times daily as needed for hemorrhoids or itching. (Patient not taking: Reported on 06/14/2021) 20 suppository 0   LORazepam (ATIVAN) 0.5 MG tablet Take 1-2 tablets (0.5-1 mg total) by mouth at bedtime as needed for anxiety or sleep. 60 tablet 3   No facility-administered medications prior to visit.    ROS: Review of Systems  Constitutional:  Negative for activity change, appetite change, chills, fatigue and unexpected weight  change.  HENT:  Negative for congestion, mouth sores and sinus pressure.   Eyes:  Negative for visual disturbance.  Respiratory:  Negative for cough and chest tightness.   Gastrointestinal:  Negative for abdominal pain and nausea.  Genitourinary:  Negative for difficulty urinating, frequency and vaginal pain.  Musculoskeletal:  Negative for back pain and gait problem.  Skin:  Negative for pallor and rash.  Neurological:  Negative for dizziness, tremors, weakness, numbness and headaches.  Psychiatric/Behavioral:  Negative for confusion, sleep disturbance and suicidal ideas.    Objective:  BP 108/72 (BP Location: Left Arm)   Pulse 70   Temp 98.8 F  (37.1 C) (Oral)   Ht 5\' 6"  (1.676 m)   Wt 206 lb 9.6 oz (93.7 kg)   SpO2 99%   BMI 33.35 kg/m   BP Readings from Last 3 Encounters:  06/14/21 108/72  12/07/20 112/68  10/27/20 118/74    Wt Readings from Last 3 Encounters:  06/14/21 206 lb 9.6 oz (93.7 kg)  12/07/20 209 lb 12.8 oz (95.2 kg)  10/27/20 208 lb (94.3 kg)    Physical Exam Constitutional:      General: She is not in acute distress.    Appearance: She is well-developed. She is obese.  HENT:     Head: Normocephalic.     Right Ear: External ear normal.     Left Ear: External ear normal.     Nose: Nose normal.  Eyes:     General:        Right eye: No discharge.        Left eye: No discharge.     Conjunctiva/sclera: Conjunctivae normal.     Pupils: Pupils are equal, round, and reactive to light.  Neck:     Thyroid: No thyromegaly.     Vascular: No JVD.     Trachea: No tracheal deviation.  Cardiovascular:     Rate and Rhythm: Normal rate and regular rhythm.     Heart sounds: Normal heart sounds.  Pulmonary:     Effort: No respiratory distress.     Breath sounds: No stridor. No wheezing.  Abdominal:     General: Bowel sounds are normal. There is no distension.     Palpations: Abdomen is soft. There is no mass.     Tenderness: There is no abdominal tenderness. There is no guarding or rebound.  Musculoskeletal:        General: No tenderness.     Cervical back: Normal range of motion and neck supple. No rigidity.  Lymphadenopathy:     Cervical: No cervical adenopathy.  Skin:    Findings: No erythema or rash.  Neurological:     Mental Status: She is oriented to person, place, and time.     Cranial Nerves: No cranial nerve deficit.     Motor: No abnormal muscle tone.     Coordination: Coordination normal.     Deep Tendon Reflexes: Reflexes normal.  Psychiatric:        Behavior: Behavior normal.        Thought Content: Thought content normal.        Judgment: Judgment normal.    Lab Results  Component  Value Date   WBC 5.6 12/07/2020   HGB 12.3 12/07/2020   HCT 36.4 12/07/2020   PLT 275.0 12/07/2020   GLUCOSE 92 12/07/2020   CHOL 206 (H) 06/08/2020   TRIG 65.0 06/08/2020   HDL 83.80 06/08/2020   LDLCALC 109 (H) 06/08/2020   ALT 13  12/07/2020   AST 14 12/07/2020   NA 139 12/07/2020   K 4.0 12/07/2020   CL 106 12/07/2020   CREATININE 0.96 12/07/2020   BUN 14 12/07/2020   CO2 27 12/07/2020   TSH 1.21 12/07/2020   HGBA1C 5.6 10/18/2014    DG Chest 2 View  Result Date: 08/23/2020 CLINICAL DATA:  Cough.  History of COVID. EXAM: CHEST - 2 VIEW COMPARISON:  12/25/2019 FINDINGS: The lungs are clear without focal pneumonia, edema, pneumothorax or pleural effusion. The cardiopericardial silhouette is within normal limits for size. Thoracolumbar scoliosis evident. IMPRESSION: No active cardiopulmonary disease. Electronically Signed   By: Kennith Center M.D.   On: 08/23/2020 11:28    Assessment & Plan:   Problem List Items Addressed This Visit     Fatigue    Chronic.  Check CBC.  Treat insomnia      History of anemia    Check CBC      Insomnia    Lorazepam prn      Overweight    No weight gain lately.  The patient lost 3 pounds according to our record.  Continue with current diet      Vitamin D deficiency    Risks associated with treatment noncompliance were discussed. Compliance was encouraged. Will do Vit D monthly         Meds ordered this encounter  Medications   LORazepam (ATIVAN) 1 MG tablet    Sig: Take 1-2 tablets (1-2 mg total) by mouth at bedtime as needed for sleep.    Dispense:  60 tablet    Refill:  3   Vitamin D, Ergocalciferol, (DRISDOL) 1.25 MG (50000 UNIT) CAPS capsule    Sig: Take 1 capsule (50,000 Units total) by mouth every 30 (thirty) days.    Dispense:  3 capsule    Refill:  3      Follow-up: Return in about 6 months (around 12/12/2021) for a follow-up visit.  Sonda Primes, MD

## 2021-06-14 NOTE — Assessment & Plan Note (Addendum)
Risks associated with treatment noncompliance were discussed. Compliance was encouraged. Will do Vit D monthly

## 2021-06-14 NOTE — Patient Instructions (Addendum)
[email or web address]  The Obesity Code book by Wylene Simmer   These suggestions will probably help you to improve your metabolism if you are not overweight and to lose weight if you are overweight: 1.  Reduce your consumption of sugars and starches.  Eliminate high fructose corn syrup from your diet.  Reduce your consumption of processed foods.  For desserts try to have seasonal fruits, berries, nuts, cheeses or dark chocolate with more than 70% cacao. 2.  Do not snack 3.  You do not have to eat breakfast.  If you choose to have breakfast - eat plain greek yogurt, eggs, oatmeal (without sugar) - use honey if you need to. 4.  Drink water, freshly brewed unsweetened tea (green, black or herbal) or coffee.  Do not drink sodas including diet sodas , juices, beverages sweetened with artificial sweeteners. 5.  Reduce your consumption of refined grains. 6.  Avoid protein drinks such as Optifast, Slim fast etc. Eat chicken, fish, meat, dairy and beans for your sources of protein. 7.  Natural unprocessed fats like cold pressed virgin olive oil, butter, coconut oil are good for you.  Eat avocados. 8.  Increase your consumption of fiber.  Fruits, berries, vegetables, whole grains, flaxseed, chia seeds, beans, popcorn, nuts, oatmeal are good sources of fiber 9.  Use vinegar in your diet, i.e. apple cider vinegar, red wine or balsamic vinegar 10.  You can try fasting.  For example you can skip breakfast and lunch every other day (24-hour fast) 11.  Stress reduction, good night sleep, relaxation, meditation, yoga and other physical activity is likely to help you to maintain low weight too. 12.  If you drink alcohol, limit your alcohol intake to no more than 2 drinks a day.

## 2021-06-17 NOTE — Assessment & Plan Note (Signed)
Chronic.  Check CBC.  Treat insomnia

## 2021-06-17 NOTE — Assessment & Plan Note (Signed)
Check CBC 

## 2021-06-17 NOTE — Assessment & Plan Note (Signed)
No weight gain lately.  The patient lost 3 pounds according to our record.  Continue with current diet

## 2021-06-21 DIAGNOSIS — M5459 Other low back pain: Secondary | ICD-10-CM | POA: Diagnosis not present

## 2021-06-21 DIAGNOSIS — M47816 Spondylosis without myelopathy or radiculopathy, lumbar region: Secondary | ICD-10-CM | POA: Diagnosis not present

## 2021-06-29 ENCOUNTER — Ambulatory Visit
Admission: EM | Admit: 2021-06-29 | Discharge: 2021-06-29 | Disposition: A | Discharge status: Home / Self Care | Payer: BC Managed Care – PPO | Attending: Emergency Medicine | Admitting: Emergency Medicine

## 2021-06-29 ENCOUNTER — Other Ambulatory Visit: Payer: Self-pay

## 2021-06-29 DIAGNOSIS — M62838 Other muscle spasm: Secondary | ICD-10-CM | POA: Diagnosis not present

## 2021-06-29 DIAGNOSIS — M542 Cervicalgia: Secondary | ICD-10-CM | POA: Diagnosis not present

## 2021-06-29 MED ORDER — BACLOFEN 10 MG PO TABS: 10.0000 mg | ORAL_TABLET | Freq: Every day | ORAL | 0 refills | Status: AC

## 2021-06-29 MED ORDER — ALL-BODY MASSAGE MISC: 13 refills | Status: DC

## 2021-06-29 MED ORDER — METHYLPREDNISOLONE SODIUM SUCC 125 MG IJ SOLR
125.0000 mg | Freq: Once | INTRAMUSCULAR | Status: AC
  Administered 2021-06-29: 11:00:00 125 mg via INTRAMUSCULAR

## 2021-06-29 MED ORDER — METHYLPREDNISOLONE 8 MG PO TABS: ORAL_TABLET | ORAL | 0 refills | Status: AC

## 2021-06-29 NOTE — Discharge Instructions (Addendum)
You received an injection of Solu-Medrol in the office today, this is the injectable version of methylprednisolone.  This should significantly reduce the inflammation in your neck muscles and the nerves in that area as well.  To continue keeping this inflammation well controlled.    I have prescribed an oral course of methylprednisolone in the form of 8 mg tablets.  Please take them exactly as prescribed.    I have also provided you with a muscle relaxer called baclofen that you can take once every night at or before bedtime, you could adjust the timing of this based on how sleepy you feel the next morning.  This should provide you a significant the relaxed, good night sleep.  I have also provided a prescription for your husband to provide you with 15 minutes of gentle massage in shoulder, upper back and neck regions every night while you are being treated.    You may also consider applying either ice or heat to the affected areas for relief, personally I prefer ice because it is an anti-inflammatory, but some people prefer heat because it increases circulation and eases tension.  Some people like to alternate them back-and-forth.  Please return in the next 2 to 5 days if you have not had good relief of your symptoms.  We can certainly repeat the Solu-Medrol injection or provide you with a different injection called ketorolac which is also an anti-inflammatory but nonsteroidal.

## 2021-06-29 NOTE — ED Provider Notes (Signed)
[facility]    CSN: [id number] Arrival date & time: 06/29/21  0830   History   Chief Complaint Chief Complaint  Patient presents with   Shoulder Pain   HPI Angel [name] is a 45 y.o. female. Pt states she has been having shoulder pain (right). She is able to move arm but states the shoulder is stiff and looks swollen.  Patient denies rash.  Patient states her regular provider did not evaluate her in person but did send her a prescription for Medrol Dosepak along with a prescription for gabapentin.  Patient states that she had a slight improvement after the first day of Medrol however she did not take all the tablets at once, scattered them throughout the day.  Patient has a history of lumbar degenerative disc disease current being followed by neurology, patient also has history of vitamin D deficiency.  Patient states she had an x-ray of her shoulder a few years ago, states she was told that she had some arthritis in her neck as well.  Patient denies numbness, tingling, pain radiating down her arm but does state that her upper arm and her shoulder is a little sore as well.  The history is provided by the patient.   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 Compliance was encouraged.     Patient Active Problem List   Diagnosis Date Noted   Shingles outbreak 12/07/2020   Chronic right shoulder pain 10/27/2020   Primary osteoarthritis involving multiple joints 10/27/2020   Well adult exam 06/08/2020   Abnormal stress test 12/25/2019   Atypical squamous cells of undetermined significance (ASC-US) on cervical Pap smear 10/30/2019   Cyst of right ovary 10/30/2019   History of anemia 10/30/2019   History of dysmenorrhea 10/30/2019   History of hematuria 10/30/2019   [place]z 10/30/2019   Cardiac murmur 09/04/2019   Overweight 09/04/2019   DOE (dyspnea on exertion) 06/10/2019   Degeneration of intervertebral disc of cervical spine without prolapsed disc 08/21/2017   Fatigue 06/04/2017   Irritant dermatitis 11/17/2014   Insomnia 10/07/2013   Breast lump 06/08/2013   Bloating symptom 04/30/2012   Vitamin D deficiency 09/03/2007   Sickle cell trait ([place]) 05/20/2007   Past Surgical History:  Procedure Laterality Date   BILATERAL SALPINGECTOMY  09/26/2015   Procedure: BILATERAL SALPINGECTOMY;  Surgeon: Everett Graff, MD;  Location: [facility];  Service: Gynecology;;   BREAST BIOPSY Bilateral 07/13/2013   Procedure:  BILATERAL EXCISION BREAS MASSES;  Surgeon: Shelly Rubenstein, MD;  Location: [facility];  Service: General;  Laterality: Bilateral;   BREAST CYST EXCISION Left 2014   4 cysts removed   DE QUERVAIN'S RELEASE Right 09/06/2003   DILITATION & CURRETTAGE/HYSTROSCOPY WITH NOVASURE ABLATION N/A 09/26/2015   Procedure: DILATATION & CURETTAGE/HYSTEROSCOPY WITH NOVASURE ABLATION;  Surgeon: Osborn Coho, MD;  Location: [facility];  Service: Gynecology;   Laterality: N/A;   LAPAROSCOPIC OVARIAN CYSTECTOMY Right 09/26/2015   Procedure: LAPAROSCOPIC Right OVARIAN CYSTECTOMY, Bilateral Peritoneal Biopsies over Uteralsacral area;  Surgeon: Osborn Coho, MD;  Location: [facility];  Service: Gynecology;  Laterality: Right;   LEFT HEART CATH AND CORONARY ANGIOGRAPHY N/A 12/30/2019   Procedure: LEFT HEART CATH AND CORONARY ANGIOGRAPHY;  Surgeon: Marykay Lex, MD;  Location: [facility];  Service: Cardiovascular;  Laterality: N/A;   SPINAL FUSION  07/05/2018   TUBAL LIGATION  12/27/2005   WISDOM TOOTH EXTRACTION     WRIST GANGLION EXCISION Right 09/06/2003   WRIST GANGLION EXCISION Left 09/04/1999   OB History     Gravida  2   Para  2   Term  2   Preterm      AB      Living  2      SAB      IAB      Ectopic      Multiple      Live Births             Home Medications    Prior to Admission medications   Medication Sig Start Date End Date Taking? Authorizing Provider  baclofen (LIORESAL) 10 MG tablet Take 1 tablet (10 mg total) by mouth at bedtime for 14 days. 06/29/21 07/13/21 Yes Theadora Rama Scales, PA-C  methylPREDNISolone (MEDROL) 8 MG tablet Take 3 tablets (24 mg total) by mouth daily for 2 days, THEN 2.5 tablets (20 mg total) daily for 2 days, THEN 2 tablets (16 mg total) daily for 2 days, THEN 1.5 tablets (12 mg total) daily for 2 days, THEN 1 tablet (8 mg total) daily for 2 days, THEN 0.5 tablets (4 mg total) daily for 4 days. 06/29/21 07/13/21 Yes Theadora Rama Scales, PA-C  Misc. Devices (ALL-BODY MASSAGE) MISC Please provide patient with 15 minutes of gentle, comforting massage in the shoulder, neck, upper back region every evening 30 minutes prior to bedtime.  Repeat as often as needed. 06/29/21  Yes Theadora Rama Scales, PA-C  acetaminophen (TYLENOL) 500 MG tablet Take 1,000 mg by mouth daily.     [provider]  albuterol (VENTOLIN HFA) 108 (90 Base) MCG/ACT inhaler INHALE 2 PUFFS INTO THE LUNGS  EVERY 6 HOURS AS NEEDED FOR WHEEZING OR SHORTNESS OF BREATH 10/16/20   Plotnikov, Georgina Quint, MD  aspirin EC 81 MG tablet Take 1 tablet (81 mg total) by mouth daily. 12/24/19   Revankar, Aundra Dubin, MD  betamethasone dipropionate 0.05 % cream Apply 1 application topically daily as needed (Eczema).     [provider]  clindamycin (CLINDAGEL) 1 % gel Apply 1 application topically daily as needed (acne).  07/21/14   [provider]  etodolac (LODINE XL) 400 MG 24 hr tablet Take 1 tablet (400 mg total) by mouth daily. 10/27/20   Etta Grandchild, MD  fluticasone (FLONASE) 50 MCG/ACT nasal spray Place 2 sprays into both nostrils daily. 08/19/19   Olive Bass, FNP  gabapentin (NEURONTIN) 100 MG capsule Take by mouth. Take 1  By mouth twice a day 05/24/21   [provider]  hydrocortisone (ANUSOL-HC) 2.5 % rectal cream Use bid Patient taking differently: Place 1 application rectally 2 (two) times daily as needed for hemorrhoids. 10/29/18   Plotnikov, Georgina Quint, MD  hydrOXYzine (ATARAX/VISTARIL) 10 MG tablet Take 1-2 tablets after dinner for itch 01/05/20   [provider]  levocetirizine (XYZAL) 5 MG tablet Take 1 tablet (5 mg total) by mouth every evening. 03/02/20   Olive Bass, FNP  LORazepam (ATIVAN) 1 MG tablet Take 1-2 tablets (1-2 mg total) by mouth at bedtime as needed for sleep. 06/14/21   Plotnikov, Georgina Quint, MD  meloxicam (MOBIC) 7.5 MG tablet Take 1 by mouth daily    [provider]  nitroGLYCERIN (NITROSTAT) 0.4 MG SL tablet Place 1 tablet (0.4 mg total) under the tongue every 5 (five) minutes as needed for chest pain. 12/24/19 03/23/20  Revankar, Aundra Dubin, MD  spironolactone (ALDACTONE) 50 MG tablet Take 50 mg by mouth daily.  05/06/18   [provider]  SUMAtriptan (IMITREX) 100 MG tablet Take 1 tablet (100 mg total) by mouth every 2 (two) hours as needed for migraine. May repeat in 2 hours if headache persists or recurs. 12/22/20    Plotnikov, Georgina Quint, MD  tiZANidine (ZANAFLEX) 4 MG tablet Take 4 mg by mouth daily as needed (Back pain).     [provider]  valACYclovir (VALTREX) 1000 MG tablet Take 1 tablet (1,000 mg total) by mouth 3 (three) times daily. 12/07/20   Plotnikov, Georgina Quint, MD  Vitamin D, Ergocalciferol, (DRISDOL) 1.25 MG (50000 UNIT) CAPS capsule Take 1 capsule (50,000 Units total) by mouth every 30 (thirty) days. 06/14/21   Plotnikov, Georgina Quint, MD   Family History Family History  Problem Relation Age of Onset   Hypertension Father    Diabetes Father    Heart disease Father    Cancer Father        prostate   Sickle cell anemia Daughter    Sickle cell anemia Son    Social History Social History   Tobacco Use   Smoking status: Never   Smokeless tobacco: Never  Substance Use Topics   Alcohol use: No   Drug use: No   Allergies   Other, Trazodone and nefazodone, Wound dressing adhesive, and Zolpidem  Review of Systems Review of Systems Pertinent findings noted in history of present illness.   Physical Exam Triage Vital Signs ED Triage Vitals  Enc Vitals Group     BP 06/09/21 0827 (!) 147/82     Pulse Rate 06/09/21 0827 72     Resp 06/09/21 0827 18     Temp 06/09/21 0827 98.3 F (36.8 C)     Temp Source 06/09/21 0827 Oral     SpO2 06/09/21 0827 98 %     Weight --      Height --      Head Circumference --      Peak Flow --      Pain Score 06/09/21 0826 5     Pain Loc --      Pain Edu? --      Excl. in GC? --    No data found.  Updated Vital Signs BP 105/69 (BP Location: Left Arm)   Pulse 63   Temp 98 F (36.7 C) (Oral)   Resp 20   SpO2 99%   Visual Acuity Right Eye Distance:   Left Eye Distance:   Bilateral Distance:  Right Eye Near:   Left Eye Near:    Bilateral Near:     Physical Exam Vitals and nursing note reviewed.  Constitutional:      General: She is not in acute distress.    Appearance: Normal appearance. She is not ill-appearing.  HENT:      Head: Normocephalic and atraumatic.  Eyes:     General: Lids are normal.        Right eye: No discharge.        Left eye: No discharge.     Extraocular Movements: Extraocular movements intact.     Conjunctiva/sclera: Conjunctivae normal.     Right eye: Right conjunctiva is not injected.     Left eye: Left conjunctiva is not injected.  Neck:     Trachea: Trachea and phonation normal.  Cardiovascular:     Rate and Rhythm: Normal rate and regular rhythm.     Pulses: Normal pulses.     Heart sounds: Normal heart sounds. No murmur heard.   No friction rub. No gallop.  Pulmonary:     Effort: Pulmonary effort is normal. No accessory muscle usage, prolonged expiration or respiratory distress.     Breath sounds: Normal breath sounds. No stridor, decreased air movement or transmitted upper airway sounds. No decreased breath sounds, wheezing, rhonchi or rales.  Chest:     Chest wall: No tenderness.  Musculoskeletal:        General: Tenderness (Right cervical paraspinous muscles and upper trapezius) present. Normal range of motion.     Cervical back: Normal range of motion and neck supple. Normal range of motion.  Lymphadenopathy:     Cervical: No cervical adenopathy.  Skin:    General: Skin is warm and dry.     Findings: No erythema or rash.  Neurological:     General: No focal deficit present.     Mental Status: She is alert and oriented to person, place, and time.  Psychiatric:        Mood and Affect: Mood normal.        Behavior: Behavior normal.   UC Treatments / Results  Labs (all labs ordered are listed, but only abnormal results are displayed)  Labs Reviewed - No data to display  EKG  Radiology No results found.  Procedures Procedures (including critical care time)  Medications Ordered in UC Medications  methylPREDNISolone sodium succinate (SOLU-MEDROL) 125 mg/2 mL injection 125 mg (125 mg Intramuscular Given 06/29/21 1032)    Initial Impression / Assessment and  Plan / UC Course  I have reviewed the triage vital signs and the nursing notes.  Pertinent labs & imaging results that were available during my care of the patient were reviewed by me and considered in my medical decision making (see chart for details).      With the exception of extensive spasm of the upper trapezius and cervical paraspinous muscles, patient appears to be in no acute distress and is otherwise well-appearing today.  I have advised patient that given her inadequate response to a single Medrol Dosepak and her history of arthritis in her cervical spine as seen on shoulder x-ray a few years ago, I recommend that she take a stronger and longer course of methylprednisolone, she was also provided with an injection of Solu-Medrol in the office today.  I also advised her to ask her husband to provide her with that nightly gentle massage of her muscles in her shoulder, upper back and neck.  Finally, nightly muscle relaxer for  the next few weeks would also be of benefit, prescription for baclofen was sent.  Return precautions advised.  Final Clinical Impressions(s) / UC Diagnoses   Final diagnoses:  Cervicalgia  Cervical paraspinous muscle spasm     Discharge Instructions      You received an injection of Solu-Medrol in the office today, this is the injectable version of methylprednisolone.  This should significantly reduce the inflammation in your neck muscles and the nerves in that area as well.  To continue keeping this inflammation well controlled.    I have prescribed an oral course of methylprednisolone in the form of 8 mg tablets.  Please take them exactly as prescribed.    I have also provided you with a muscle relaxer called baclofen that you can take once every night at or before bedtime, you could adjust the timing of this based on how sleepy you feel the next morning.  This should provide you a significant the relaxed, good night sleep.  I have also provided a prescription  for your husband to provide you with 15 minutes of gentle massage in shoulder, upper back and neck regions every night while you are being treated.    You may also consider applying either ice or heat to the affected areas for relief, personally I prefer ice because it is an anti-inflammatory, but some people prefer heat because it increases circulation and eases tension.  Some people like to alternate them back-and-forth.  Please return in the next 2 to 5 days if you have not had good relief of your symptoms.  We can certainly repeat the Solu-Medrol injection or provide you with a different injection called ketorolac which is also an anti-inflammatory but nonsteroidal.     ED Prescriptions     Medication Sig Dispense Auth. Provider   methylPREDNISolone (MEDROL) 8 MG tablet Take 3 tablets (24 mg total) by mouth daily for 2 days, THEN 2.5 tablets (20 mg total) daily for 2 days, THEN 2 tablets (16 mg total) daily for 2 days, THEN 1.5 tablets (12 mg total) daily for 2 days, THEN 1 tablet (8 mg total) daily for 2 days, THEN 0.5 tablets (4 mg total) daily for 4 days. 22 tablet Lynden Oxford Scales, PA-C   baclofen (LIORESAL) 10 MG tablet Take 1 tablet (10 mg total) by mouth at bedtime for 14 days. 14 tablet Lynden Oxford Scales, PA-C   Misc. Devices (ALL-BODY MASSAGE) MISC Please provide patient with 15 minutes of gentle, comforting massage in the shoulder, neck, upper back region every evening 30 minutes prior to bedtime.  Repeat as often as needed. 15 each Lynden Oxford Scales, PA-C      PDMP not reviewed this encounter.  Disposition Upon Discharge:  Patient presented with an acute illness with associated systemic symptoms and significant discomfort requiring urgent management. In my opinion, this is a condition that a prudent lay person (someone who possesses an average knowledge of health and medicine) may potentially expect to result in complications if not addressed urgently such as  respiratory distress, impairment of bodily function or dysfunction of bodily organs.   Routine symptom specific, illness specific and/or disease specific instructions were discussed with the patient and/or caregiver at length.   As such, the patient has been evaluated and assessed, work-up was performed and treatment was provided in alignment with urgent care protocols and evidence based medicine.  Patient/parent/caregiver has been advised that the patient may require follow up for further testing and treatment if the symptoms continue  in spite of treatment, as clinically indicated and appropriate.  Patient/parent/caregiver has been advised to return to the [facility] or PCP in 3-5 days if no better; to PCP or the Emergency Department if new signs and symptoms develop, or if the current signs or symptoms continue to change or worsen for further workup, evaluation and treatment as clinically indicated and appropriate  The patient will follow up with their current PCP if and as advised. If the patient does not currently have a PCP we will assist them in obtaining one.   The patient may need specialty follow up if the symptoms continue, in spite of conservative treatment and management, for further workup, evaluation, consultation and treatment as clinically indicated and appropriate.  Patient/parent/caregiver verbalized understanding and agreement of plan as discussed.  All questions were addressed during visit.  Please see discharge instructions below for further details of plan.  Condition: stable for discharge home Home: take medications as prescribed; routine discharge instructions as discussed; follow up as advised.    Lynden Oxford Scales, PA-C 06/29/21 1250

## 2021-06-29 NOTE — ED Triage Notes (Signed)
Pt states she has been having shoulder pain (right). She is able to move arm but states the shoulder is stiff and looks swollen.  Started: a month ago

## 2021-07-17 DIAGNOSIS — H40023 Open angle with borderline findings, high risk, bilateral: Secondary | ICD-10-CM | POA: Diagnosis not present

## 2021-07-18 DIAGNOSIS — M47816 Spondylosis without myelopathy or radiculopathy, lumbar region: Secondary | ICD-10-CM | POA: Diagnosis not present

## 2021-07-18 DIAGNOSIS — M5459 Other low back pain: Secondary | ICD-10-CM | POA: Diagnosis not present

## 2021-08-24 DIAGNOSIS — Z20822 Contact with and (suspected) exposure to covid-19: Secondary | ICD-10-CM | POA: Diagnosis not present

## 2021-08-24 DIAGNOSIS — Z03818 Encounter for observation for suspected exposure to other biological agents ruled out: Secondary | ICD-10-CM | POA: Diagnosis not present

## 2021-08-30 DIAGNOSIS — M5136 Other intervertebral disc degeneration, lumbar region: Secondary | ICD-10-CM | POA: Diagnosis not present

## 2021-08-30 DIAGNOSIS — M4722 Other spondylosis with radiculopathy, cervical region: Secondary | ICD-10-CM | POA: Diagnosis not present

## 2021-08-30 DIAGNOSIS — M47816 Spondylosis without myelopathy or radiculopathy, lumbar region: Secondary | ICD-10-CM | POA: Diagnosis not present

## 2021-08-30 DIAGNOSIS — M5459 Other low back pain: Secondary | ICD-10-CM | POA: Diagnosis not present

## 2021-09-05 DIAGNOSIS — M25511 Pain in right shoulder: Secondary | ICD-10-CM | POA: Diagnosis not present

## 2021-09-05 DIAGNOSIS — M79601 Pain in right arm: Secondary | ICD-10-CM | POA: Diagnosis not present

## 2021-09-05 DIAGNOSIS — M5412 Radiculopathy, cervical region: Secondary | ICD-10-CM | POA: Diagnosis not present

## 2021-09-05 DIAGNOSIS — M542 Cervicalgia: Secondary | ICD-10-CM | POA: Diagnosis not present

## 2021-10-03 DIAGNOSIS — L732 Hidradenitis suppurativa: Secondary | ICD-10-CM | POA: Diagnosis not present

## 2021-10-03 DIAGNOSIS — L700 Acne vulgaris: Secondary | ICD-10-CM | POA: Diagnosis not present

## 2021-10-04 ENCOUNTER — Telehealth: Payer: Self-pay | Admitting: Internal Medicine

## 2021-10-04 NOTE — Telephone Encounter (Signed)
Patient calling in  Patient says she has had a severe migraine about 3 days & the med SUMAtriptan (IMITREX) 100 MG tablet is not working   Would like cb from nurse [phone]

## 2021-10-05 MED ORDER — UBRELVY 100 MG PO TABS: 100.0000 mg | ORAL_TABLET | Freq: Every day | ORAL | 5 refills | Status: DC | PRN

## 2021-10-05 NOTE — Telephone Encounter (Signed)
We can send in Ubrelvy to [facility].  Please see any provider ASAP.  Thanks

## 2021-10-09 ENCOUNTER — Ambulatory Visit: Payer: BC Managed Care – PPO | Admitting: Internal Medicine

## 2021-10-09 ENCOUNTER — Encounter: Payer: Self-pay | Admitting: Internal Medicine

## 2021-10-09 ENCOUNTER — Other Ambulatory Visit: Payer: Self-pay

## 2021-10-09 VITALS — BP 128/70 | HR 60 | Temp 98.0°F | Ht 66.0 in | Wt 206.0 lb

## 2021-10-09 DIAGNOSIS — G4700 Insomnia, unspecified: Secondary | ICD-10-CM | POA: Diagnosis not present

## 2021-10-09 DIAGNOSIS — E559 Vitamin D deficiency, unspecified: Secondary | ICD-10-CM

## 2021-10-09 DIAGNOSIS — G43909 Migraine, unspecified, not intractable, without status migrainosus: Secondary | ICD-10-CM | POA: Insufficient documentation

## 2021-10-09 MED ORDER — BUTALBITAL-APAP-CAFFEINE 50-325-40 MG PO TABS: 1.0000 | ORAL_TABLET | Freq: Four times a day (QID) | ORAL | 0 refills | Status: DC | PRN

## 2021-10-09 MED ORDER — METHYLPREDNISOLONE ACETATE 80 MG/ML IJ SUSP
80.0000 mg | Freq: Once | INTRAMUSCULAR | Status: AC
  Administered 2021-10-09: 80 mg via INTRAMUSCULAR

## 2021-10-09 MED ORDER — KETOROLAC TROMETHAMINE 30 MG/ML IJ SOLN
30.0000 mg | Freq: Once | INTRAMUSCULAR | Status: AC
  Administered 2021-10-09: 30 mg via INTRAMUSCULAR

## 2021-10-09 NOTE — Telephone Encounter (Signed)
PA Approved today Effective from 10/09/2021 through 12/31/2021.

## 2021-10-09 NOTE — Assessment & Plan Note (Signed)
Now c/w transformed migraine, for toradol 30 and depomedrol im 80, then fioricet prn as bridge to [place] prn with delay given PA need to be approved

## 2021-10-09 NOTE — Assessment & Plan Note (Signed)
Stable overall, ok for melatonin prn

## 2021-10-09 NOTE — Patient Instructions (Signed)
You had the toradol and depomedrol shots today for the migraine  Please take all new medication as prescribed - the fioricet until the [name] is approved  Please continue all other medications as before, and refills have been done if requested.  Please have the pharmacy call with any other refills you may need.  Please keep your appointments with your specialists as you may have planned

## 2021-10-09 NOTE — Progress Notes (Signed)
Patient ID: Angel [name], female   DOB: August 16, 1975, 46 y.o.   MRN: [id number]        Chief Complaint: follow up migraine x 7 days       HPI:  Angel [name] is a 46 y.o. female here with c/o 7 days of right sided throbbing HA with nausea and photophobia but no vomiting, photophobia or vision changes.  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   Pt denies polydipsia, polyuria, or new focal neuro s/s.   Pt denies fever, wt loss, night sweats, loss of appetite, or other constitutional symptoms . Sleeping has been stable recently, Denies worsening depressive symptoms, suicidal ideation, or panic;       Wt Readings from Last 3 Encounters:  10/09/21 206 lb (93.4 kg)  06/14/21 206 lb 9.6 oz (93.7 kg)  12/07/20 209 lb 12.8 oz (95.2 kg)   BP Readings from Last 3 Encounters:  10/09/21 128/70  06/29/21 105/69  06/14/21 108/72         Past Medical History:  Diagnosis Date   Atypical squamous cells of undetermined significance (ASC-US) on cervical Pap smear 10/30/2019   Bloating symptom 04/30/2012   9/13 2 hrs long cramping episodes associated w/diarrhea x 3 episodes R/o a viral illness, GS, pancreatitis and other dx's 9/18 gluten free diet   Breast lump 06/08/2013   Breast mass 06/2013   bilateral   Cardiac murmur 09/04/2019   Cyst of right ovary 10/30/2019   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 Compliance was encouraged.     Past Surgical History:  Procedure Laterality Date   BILATERAL SALPINGECTOMY  09/26/2015   Procedure: BILATERAL SALPINGECTOMY;  Surgeon: Everett Graff, MD;  Location: [facility];  Service: Gynecology;;   BREAST BIOPSY Bilateral 07/13/2013   Procedure: BILATERAL EXCISION BREAS MASSES;  Surgeon: Harl Bowie, MD;  Location: [facility];  Service: General;  Laterality: Bilateral;   BREAST CYST EXCISION Left 2014   4 cysts removed   DE QUERVAIN'S RELEASE Right 09/06/2003   DILITATION & CURRETTAGE/HYSTROSCOPY WITH NOVASURE ABLATION N/A 09/26/2015   Procedure: DILATATION & CURETTAGE/HYSTEROSCOPY WITH NOVASURE ABLATION;  Surgeon: Everett Graff, MD;  Location: [facility];  Service: Gynecology;  Laterality: N/A;   LAPAROSCOPIC OVARIAN CYSTECTOMY Right 09/26/2015   Procedure: LAPAROSCOPIC Right OVARIAN CYSTECTOMY, Bilateral Peritoneal Biopsies over Uteralsacral area;  Surgeon: Everett Graff, MD;  Location: [facility];  Service: Gynecology;  Laterality: Right;   LEFT HEART CATH AND CORONARY ANGIOGRAPHY N/A 12/30/2019   Procedure: LEFT HEART CATH AND  CORONARY ANGIOGRAPHY;  Surgeon: Leonie Man, MD;  Location: [facility];  Service: Cardiovascular;  Laterality: N/A;   SPINAL FUSION  07/05/2018   TUBAL LIGATION  12/27/2005   WISDOM TOOTH EXTRACTION     WRIST GANGLION EXCISION Right 09/06/2003   WRIST GANGLION EXCISION Left 09/04/1999    reports that she has never smoked. She has never used smokeless tobacco. She reports that she does not drink alcohol and does not use drugs. family history includes Cancer in her father; Diabetes in her  father; Heart disease in her father; Hypertension in her father; Sickle cell anemia in her daughter and son. Allergies  Allergen Reactions   Other Dermatitis    Surgical glues Surgical glues   Trazodone And Nefazodone    Wound Dressing Adhesive     Other reaction(s): Rash:itching   Zolpidem     Ate in her sleep   Current Outpatient Medications on File Prior to Visit  Medication Sig Dispense Refill   acetaminophen (TYLENOL) 500 MG tablet Take 1,000 mg by mouth daily.      albuterol (VENTOLIN HFA) 108 (90 Base) MCG/ACT inhaler INHALE 2 PUFFS INTO THE LUNGS EVERY 6 HOURS AS NEEDED FOR WHEEZING OR SHORTNESS OF BREATH 6.7 g 3   aspirin EC 81 MG tablet Take 1 tablet (81 mg total) by mouth daily. 90 tablet 3   betamethasone dipropionate 0.05 % cream Apply 1 application topically daily as needed (Eczema).      clindamycin (CLINDAGEL) 1 % gel Apply 1 application topically daily as needed (acne).   11   etodolac (LODINE XL) 400 MG 24 hr tablet Take 1 tablet (400 mg total) by mouth daily. 90 tablet 0   fluticasone (FLONASE) 50 MCG/ACT nasal spray Place 2 sprays into both nostrils daily. 16 g 6   gabapentin (NEURONTIN) 100 MG capsule Take by mouth. Take 1 By mouth twice a day     hydrocortisone (ANUSOL-HC) 2.5 % rectal cream Use bid (Patient taking differently: Place 1 application rectally 2 (two) times daily as needed for hemorrhoids.) 30 g 1   hydrOXYzine (ATARAX/VISTARIL) 10 MG tablet Take 1-2 tablets after dinner for itch     levocetirizine (XYZAL) 5 MG tablet Take 1 tablet (5 mg total) by mouth every evening. 30 tablet 1   LORazepam (ATIVAN) 1 MG tablet Take 1-2 tablets (1-2 mg total) by mouth at bedtime as needed for sleep. 60 tablet 3   meloxicam (MOBIC) 7.5 MG tablet Take 1 by mouth daily     Misc. Devices (ALL-BODY MASSAGE) MISC Please provide patient with 15 minutes of gentle, comforting massage in the shoulder, neck, upper back region every evening 30 minutes prior to bedtime.  Repeat as  often as needed. 15 each 13   spironolactone (ALDACTONE) 50 MG tablet Take 50 mg by mouth daily.      tiZANidine (ZANAFLEX) 4 MG tablet Take 4 mg by mouth daily as needed (Back pain).      valACYclovir (VALTREX) 1000 MG tablet Take 1 tablet (1,000 mg total) by mouth 3 (three) times daily. 21 tablet 0   Vitamin D, Ergocalciferol, (DRISDOL) 1.25 MG (50000 UNIT) CAPS capsule Take 1 capsule (50,000 Units total) by mouth every 30 (thirty) days. 3 capsule 3   nitroGLYCERIN (NITROSTAT) 0.4 MG SL tablet Place 1 tablet (0.4 mg total) under the tongue every 5 (five) minutes as needed for chest pain. 25 tablet 6   SUMAtriptan (IMITREX) 100 MG tablet Take 1 tablet (100 mg total) by  mouth every 2 (two) hours as needed for migraine. May repeat in 2 hours if headache persists or recurs. (Patient not taking: Reported on 10/09/2021) 12 tablet 1   Ubrogepant (UBRELVY) 100 MG TABS Take 100 mg by mouth daily as needed. Migraine headaches (Patient not taking: Reported on 10/09/2021) 6 tablet 5   No current facility-administered medications on file prior to visit.        ROS:  All others reviewed and negative.  Objective        PE:  BP 128/70 (BP Location: Right Arm, Patient Position: Sitting, Cuff Size: Large)    Pulse 60    Temp 98 F (36.7 C) (Oral)    Ht 5\' 6"  (1.676 m)    Wt 206 lb (93.4 kg)    SpO2 100%    BMI 33.25 kg/m                 Constitutional: Pt appears in NAD               HENT: Head: NCAT.                Right Ear: External ear normal.                 Left Ear: External ear normal.                Eyes: . Pupils are equal, round, and reactive to light. Conjunctivae and EOM are normal               Nose: without d/c or deformity               Neck: Neck supple. Gross normal ROM               Cardiovascular: Normal rate and regular rhythm.                 Pulmonary/Chest: Effort normal and breath sounds without rales or wheezing.                Abd:  Soft, NT, ND, + BS, no organomegaly                Neurological: Pt is alert. At baseline orientation, motor grossly intact, cn 2-12 intact               Skin: Skin is warm. No rashes, no other new lesions, LE edema - none               Psychiatric: Pt behavior is normal without agitation   Micro: none  Cardiac tracings I have personally interpreted today:  none  Pertinent Radiological findings (summarize): none   Lab Results  Component Value Date   WBC 5.6 12/07/2020   HGB 12.3 12/07/2020   HCT 36.4 12/07/2020   PLT 275.0 12/07/2020   GLUCOSE 92 12/07/2020   CHOL 206 (H) 06/08/2020   TRIG 65.0 06/08/2020   HDL 83.80 06/08/2020   LDLCALC 109 (H) 06/08/2020   ALT 13 12/07/2020   AST 14 12/07/2020   NA 139 12/07/2020   K 4.0 12/07/2020   CL 106 12/07/2020   CREATININE 0.96 12/07/2020   BUN 14 12/07/2020   CO2 27 12/07/2020   TSH 1.21 12/07/2020   HGBA1C 5.6 10/18/2014   Assessment/Plan:  Angel [name] is a 46 y.o. 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  Vitamin D deficiency Last vitamin D Lab Results  Component Value Date   VD25OH 21.31 (L) 12/07/2020   Low, reminded to start oral replacement   Migraine headache Now c/w transformed migraine, for  toradol 30 and depomedrol im 80, then fioricet prn as bridge to [place] prn with delay given PA need to be approved  Insomnia Stable overall, ok for melatonin prn  Followup: Return if symptoms worsen or fail to improve.  Cathlean Cower, MD 10/09/2021 8:52 PM [facility]

## 2021-10-09 NOTE — Telephone Encounter (Signed)
PA for [name] has been sent in.  Key: [place]

## 2021-10-09 NOTE — Assessment & Plan Note (Signed)
Last vitamin D Lab Results  Component Value Date   VD25OH 21.31 (L) 12/07/2020   Low, reminded to start oral replacement

## 2021-10-16 DIAGNOSIS — Z03818 Encounter for observation for suspected exposure to other biological agents ruled out: Secondary | ICD-10-CM | POA: Diagnosis not present

## 2021-10-16 DIAGNOSIS — Z20822 Contact with and (suspected) exposure to covid-19: Secondary | ICD-10-CM | POA: Diagnosis not present

## 2021-10-31 DIAGNOSIS — M5459 Other low back pain: Secondary | ICD-10-CM | POA: Diagnosis not present

## 2021-10-31 DIAGNOSIS — M47816 Spondylosis without myelopathy or radiculopathy, lumbar region: Secondary | ICD-10-CM | POA: Diagnosis not present

## 2021-11-14 ENCOUNTER — Ambulatory Visit: Payer: BC Managed Care – PPO | Admitting: Internal Medicine

## 2021-11-14 ENCOUNTER — Encounter: Payer: Self-pay | Admitting: Internal Medicine

## 2021-11-14 VITALS — BP 112/70 | HR 80 | Temp 98.7°F | Ht 66.0 in | Wt 207.0 lb

## 2021-11-14 DIAGNOSIS — G43509 Persistent migraine aura without cerebral infarction, not intractable, without status migrainosus: Secondary | ICD-10-CM | POA: Diagnosis not present

## 2021-11-14 DIAGNOSIS — R300 Dysuria: Secondary | ICD-10-CM | POA: Diagnosis not present

## 2021-11-14 DIAGNOSIS — Z136 Encounter for screening for cardiovascular disorders: Secondary | ICD-10-CM

## 2021-11-14 DIAGNOSIS — R12 Heartburn: Secondary | ICD-10-CM | POA: Insufficient documentation

## 2021-11-14 DIAGNOSIS — E538 Deficiency of other specified B group vitamins: Secondary | ICD-10-CM | POA: Diagnosis not present

## 2021-11-14 DIAGNOSIS — R5383 Other fatigue: Secondary | ICD-10-CM

## 2021-11-14 DIAGNOSIS — N390 Urinary tract infection, site not specified: Secondary | ICD-10-CM | POA: Diagnosis not present

## 2021-11-14 DIAGNOSIS — Z0000 Encounter for general adult medical examination without abnormal findings: Secondary | ICD-10-CM

## 2021-11-14 DIAGNOSIS — K5730 Diverticulosis of large intestine without perforation or abscess without bleeding: Secondary | ICD-10-CM | POA: Insufficient documentation

## 2021-11-14 DIAGNOSIS — E559 Vitamin D deficiency, unspecified: Secondary | ICD-10-CM | POA: Diagnosis not present

## 2021-11-14 LAB — COMPREHENSIVE METABOLIC PANEL
ALT: 12 U/L (ref 0–35)
AST: 15 U/L (ref 0–37)
Albumin: 4.3 g/dL (ref 3.5–5.2)
Alkaline Phosphatase: 44 U/L (ref 39–117)
BUN: 12 mg/dL (ref 6–23)
CO2: 26 mEq/L (ref 19–32)
Calcium: 9.6 mg/dL (ref 8.4–10.5)
Chloride: 104 mEq/L (ref 96–112)
Creatinine, Ser: 0.86 mg/dL (ref 0.40–1.20)
GFR: 81.51 mL/min (ref >60.00)
Glucose, Bld: 93 mg/dL (ref 70–99)
Potassium: 4.1 mEq/L (ref 3.5–5.1)
Sodium: 138 mEq/L (ref 135–145)
Total Bilirubin: 0.5 mg/dL (ref 0.2–1.2)
Total Protein: 7.1 g/dL (ref 6.0–8.3)

## 2021-11-14 LAB — LIPID PANEL
Cholesterol: 193 mg/dL (ref 0–200)
HDL: 75.4 mg/dL (ref >39.00)
LDL Cholesterol: 108 mg/dL — ABNORMAL HIGH (ref 0–99)
NonHDL: 117.79
Total CHOL/HDL Ratio: 3
Triglycerides: 50 mg/dL (ref 0.0–149.0)
VLDL: 10 mg/dL (ref 0.0–40.0)

## 2021-11-14 LAB — CBC WITH DIFFERENTIAL/PLATELET
Basophils Absolute: 0 10*3/uL (ref 0.0–0.1)
Basophils Relative: 0.4 % (ref 0.0–3.0)
Eosinophils Absolute: 0 10*3/uL (ref 0.0–0.7)
Eosinophils Relative: 0.2 % (ref 0.0–5.0)
HCT: 37.7 % (ref 36.0–46.0)
Hemoglobin: 12.7 g/dL (ref 12.0–15.0)
Lymphocytes Relative: 24.3 % (ref 12.0–46.0)
Lymphs Abs: 1.3 10*3/uL (ref 0.7–4.0)
MCHC: 33.7 g/dL (ref 30.0–36.0)
MCV: 89.6 fl (ref 78.0–100.0)
Monocytes Absolute: 0.4 10*3/uL (ref 0.1–1.0)
Monocytes Relative: 6.7 % (ref 3.0–12.0)
Neutro Abs: 3.7 10*3/uL (ref 1.4–7.7)
Neutrophils Relative %: 68.4 % (ref 43.0–77.0)
Platelets: 254 10*3/uL (ref 150.0–400.0)
RBC: 4.2 Mil/uL (ref 3.87–5.11)
RDW: 13.3 % (ref 11.5–15.5)
WBC: 5.4 10*3/uL (ref 4.0–10.5)

## 2021-11-14 LAB — POC URINALSYSI DIPSTICK (AUTOMATED)
Bilirubin, UA: NEGATIVE
Blood, UA: NEGATIVE
Glucose, UA: NEGATIVE
Nitrite, UA: NEGATIVE
Protein, UA: NEGATIVE
Spec Grav, UA: 1.015 (ref 1.010–1.025)
Urobilinogen, UA: 0.2 E.U./dL
pH, UA: 5.5 (ref 5.0–8.0)

## 2021-11-14 LAB — VITAMIN D 25 HYDROXY (VIT D DEFICIENCY, FRACTURES): VITD: 21.52 ng/mL — ABNORMAL LOW (ref 30.00–100.00)

## 2021-11-14 LAB — VITAMIN B12: Vitamin B-12: 430 pg/mL (ref 211–911)

## 2021-11-14 LAB — TSH: TSH: 1.7 u[IU]/mL (ref 0.35–5.50)

## 2021-11-14 MED ORDER — RIZATRIPTAN BENZOATE 10 MG PO TABS: 10.0000 mg | ORAL_TABLET | Freq: Once | ORAL | 12 refills | Status: DC | PRN

## 2021-11-14 MED ORDER — FLUCONAZOLE 150 MG PO TABS: 150.0000 mg | ORAL_TABLET | Freq: Once | ORAL | 1 refills | Status: AC

## 2021-11-14 MED ORDER — NURTEC 75 MG PO TBDP: ORAL_TABLET | ORAL | 3 refills | Status: DC

## 2021-11-14 MED ORDER — PROMETHAZINE HCL 25 MG PO TABS: 25.0000 mg | ORAL_TABLET | ORAL | 3 refills | Status: DC | PRN

## 2021-11-14 MED ORDER — CEPHALEXIN 500 MG PO CAPS: 500.0000 mg | ORAL_CAPSULE | Freq: Four times a day (QID) | ORAL | 1 refills | Status: DC

## 2021-11-14 NOTE — Progress Notes (Signed)
? ?Subjective:  ?Patient ID: Angel RobMonica H Burtt, female    DOB: 09-Feb-1976  Age: 46 y.o. MRN: [id number] ? ?CC: No chief complaint on file. ? ? ?HPI ?Angel [name] presents for possible cystitis ? ?F/u migraines  not better, nausea ? ?Outpatient Medications Prior to Visit  ?Medication Sig Dispense Refill  ? acetaminophen (TYLENOL) 500 MG tablet Take 1,000 mg by mouth daily.     ? albuterol (VENTOLIN HFA) 108 (90 Base) MCG/ACT inhaler INHALE 2 PUFFS INTO THE LUNGS EVERY 6 HOURS AS NEEDED FOR WHEEZING OR SHORTNESS OF BREATH 6.7 g 3  ? aspirin EC 81 MG tablet Take 1 tablet (81 mg total) by mouth daily. 90 tablet 3  ? betamethasone dipropionate 0.05 % cream Apply 1 application topically daily as needed (Eczema).     ? clindamycin (CLINDAGEL) 1 % gel Apply 1 application topically daily as needed (acne).   11  ? etodolac (LODINE XL) 400 MG 24 hr tablet Take 1 tablet (400 mg total) by mouth daily. 90 tablet 0  ? fluticasone (FLONASE) 50 MCG/ACT nasal spray Place 2 sprays into both nostrils daily. 16 g 6  ? gabapentin (NEURONTIN) 100 MG capsule Take by mouth. Take 1 By mouth twice a day    ? hydrocortisone (ANUSOL-HC) 2.5 % rectal cream Use bid (Patient taking differently: Place 1 application. rectally 2 (two) times daily as needed for hemorrhoids.) 30 g 1  ? hydrOXYzine (ATARAX/VISTARIL) 10 MG tablet Take 1-2 tablets after dinner for itch    ? levocetirizine (XYZAL) 5 MG tablet Take 1 tablet (5 mg total) by mouth every evening. 30 tablet 1  ? LORazepam (ATIVAN) 1 MG tablet Take 1-2 tablets (1-2 mg total) by mouth at bedtime as needed for sleep. 60 tablet 3  ? meloxicam (MOBIC) 7.5 MG tablet Take 1 by mouth daily    ? Misc. Devices (ALL-BODY MASSAGE) MISC Please provide patient with 15 minutes of gentle, comforting massage in the shoulder, neck, upper back region every evening 30 minutes prior to bedtime.  Repeat as often as needed. 15 each 13  ? spironolactone (ALDACTONE) 50 MG tablet Take 50 mg by mouth daily.     ?  tiZANidine (ZANAFLEX) 4 MG tablet Take 4 mg by mouth daily as needed (Back pain).     ? valACYclovir (VALTREX) 1000 MG tablet Take 1 tablet (1,000 mg total) by mouth 3 (three) times daily. 21 tablet 0  ? Vitamin D, Ergocalciferol, (DRISDOL) 1.25 MG (50000 UNIT) CAPS capsule Take 1 capsule (50,000 Units total) by mouth every 30 (thirty) days. 3 capsule 3  ? butalbital-acetaminophen-caffeine (FIORICET) 50-325-40 MG tablet Take 1 tablet by mouth every 6 (six) hours as needed for headache. 20 tablet 0  ? nitroGLYCERIN (NITROSTAT) 0.4 MG SL tablet Place 1 tablet (0.4 mg total) under the tongue every 5 (five) minutes as needed for chest pain. 25 tablet 6  ? SUMAtriptan (IMITREX) 100 MG tablet Take 1 tablet (100 mg total) by mouth every 2 (two) hours as needed for migraine. May repeat in 2 hours if headache persists or recurs. (Patient not taking: Reported on 10/09/2021) 12 tablet 1  ? Ubrogepant (UBRELVY) 100 MG TABS Take 100 mg by mouth daily as needed. Migraine headaches (Patient not taking: Reported on 10/09/2021) 6 tablet 5  ? ?No facility-administered medications prior to visit.  ? ? ?ROS: ?Review of Systems  ?Constitutional:  Negative for activity change, appetite change, chills, fatigue and unexpected weight change.  ?HENT:  Negative for congestion, mouth  sores and sinus pressure.   ?Eyes:  Negative for visual disturbance.  ?Respiratory:  Negative for cough and chest tightness.   ?Gastrointestinal:  Negative for abdominal pain and nausea.  ?Genitourinary:  Positive for dysuria. Negative for difficulty urinating, frequency and vaginal pain.  ?Musculoskeletal:  Negative for back pain and gait problem.  ?Skin:  Negative for pallor and rash.  ?Neurological:  Negative for dizziness, tremors, weakness, numbness and headaches.  ?Psychiatric/Behavioral:  Negative for confusion and sleep disturbance.   ? ?Objective:  ?BP 112/70 (BP Location: Left Arm, Patient Position: Sitting, Cuff Size: Large)   Pulse 80   Temp 98.7 ?F  (37.1 ?C) (Oral)   Ht 5\' 6"  (1.676 m)   Wt 207 lb (93.9 kg)   SpO2 90%   BMI 33.41 kg/m?  ? ?BP Readings from Last 3 Encounters:  ?11/14/21 112/70  ?10/09/21 128/70  ?06/29/21 105/69  ? ? ?Wt Readings from Last 3 Encounters:  ?11/14/21 207 lb (93.9 kg)  ?10/09/21 206 lb (93.4 kg)  ?06/14/21 206 lb 9.6 oz (93.7 kg)  ? ? ?Physical Exam ?Constitutional:   ?   General: She is not in acute distress. ?   Appearance: She is well-developed.  ?HENT:  ?   Head: Normocephalic.  ?   Right Ear: External ear normal.  ?   Left Ear: External ear normal.  ?   Nose: Nose normal.  ?Eyes:  ?   General:     ?   Right eye: No discharge.     ?   Left eye: No discharge.  ?   Conjunctiva/sclera: Conjunctivae normal.  ?   Pupils: Pupils are equal, round, and reactive to light.  ?Neck:  ?   Thyroid: No thyromegaly.  ?   Vascular: No JVD.  ?   Trachea: No tracheal deviation.  ?Cardiovascular:  ?   Rate and Rhythm: Normal rate and regular rhythm.  ?   Heart sounds: Normal heart sounds.  ?Pulmonary:  ?   Effort: No respiratory distress.  ?   Breath sounds: No stridor. No wheezing.  ?Abdominal:  ?   General: Bowel sounds are normal. There is no distension.  ?   Palpations: Abdomen is soft. There is no mass.  ?   Tenderness: There is no abdominal tenderness. There is no guarding or rebound.  ?Musculoskeletal:     ?   General: No tenderness.  ?   Cervical back: Normal range of motion and neck supple. No rigidity.  ?Lymphadenopathy:  ?   Cervical: No cervical adenopathy.  ?Skin: ?   Findings: No erythema or rash.  ?Neurological:  ?   Mental Status: She is oriented to person, place, and time.  ?   Cranial Nerves: No cranial nerve deficit.  ?   Motor: No abnormal muscle tone.  ?   Coordination: Coordination normal.  ?   Deep Tendon Reflexes: Reflexes normal.  ?Psychiatric:     ?   Behavior: Behavior normal.     ?   Thought Content: Thought content normal.     ?   Judgment: Judgment normal.  ? ?30  minutes were spent on this encounter.  The time  was dedicated to reviewing laboratory data, disease status update, treatment choices and addressing complications noted diagnosis of therapy. ? ? ?Lab Results  ?Component Value Date  ? WBC 5.6 12/07/2020  ? HGB 12.3 12/07/2020  ? HCT 36.4 12/07/2020  ? PLT 275.0 12/07/2020  ? GLUCOSE 92 12/07/2020  ? CHOL 206 (  H) 06/08/2020  ? TRIG 65.0 06/08/2020  ? HDL 83.80 06/08/2020  ? LDLCALC 109 (H) 06/08/2020  ? ALT 13 12/07/2020  ? AST 14 12/07/2020  ? NA 139 12/07/2020  ? K 4.0 12/07/2020  ? CL 106 12/07/2020  ? CREATININE 0.96 12/07/2020  ? BUN 14 12/07/2020  ? CO2 27 12/07/2020  ? TSH 1.21 12/07/2020  ? HGBA1C 5.6 10/18/2014  ? ? ?No results found. ? ?Assessment & Plan:  ? ?Problem List Items Addressed This Visit   ? ? Vitamin D deficiency  ?  On Vit D ?  ?  ? Relevant Orders  ? VITAMIN D 25 Hydroxy (Vit-D Deficiency, Fractures)  ? Fatigue  ? Well adult exam  ? Relevant Orders  ? TSH  ? CBC with Differential/Platelet  ? Lipid panel  ? Comprehensive metabolic panel  ? Vitamin B12  ? VITAMIN D 25 Hydroxy (Vit-D Deficiency, Fractures)  ? Migraine headache  ?  Worse ?D/c Imitrex ?[name] was not covered ?Try Nurtec, Maxalt ? ? ?  ?  ? Relevant Medications  ? Rimegepant Sulfate (NURTEC) 75 MG TBDP  ? rizatriptan (MAXALT) 10 MG tablet  ? Dysuria - Primary  ?  R/o UTI ?  ?  ? ?Other Visit Diagnoses   ? ? Urinary tract infection without hematuria, site unspecified      ? Relevant Medications  ? cephALEXin (KEFLEX) 500 MG capsule  ? fluconazole (DIFLUCAN) 150 MG tablet  ? Other Relevant Orders  ? POCT Urinalysis Dipstick (Automated) (Completed)  ? Low serum vitamin B12      ? Relevant Orders  ? Vitamin B12  ? ?  ?  ? ? ?Meds ordered this encounter  ?Medications  ? Rimegepant Sulfate (NURTEC) 75 MG TBDP  ?  Sig: 1 po qod prn  ?  Dispense:  12 tablet  ?  Refill:  3  ? cephALEXin (KEFLEX) 500 MG capsule  ?  Sig: Take 1 capsule (500 mg total) by mouth 4 (four) times daily.  ?  Dispense:  20 capsule  ?  Refill:  1  ? fluconazole  (DIFLUCAN) 150 MG tablet  ?  Sig: Take 1 tablet (150 mg total) by mouth once for 1 dose.  ?  Dispense:  1 tablet  ?  Refill:  1  ? rizatriptan (MAXALT) 10 MG tablet  ?  Sig: Take 1 tablet (10 mg total) by mouth once as n

## 2021-11-14 NOTE — Assessment & Plan Note (Addendum)
Worse ?D/c Imitrex ?[name] was not covered ?Try Nurtec, Maxalt ? ? ?

## 2021-11-14 NOTE — Assessment & Plan Note (Signed)
R/o UTI

## 2021-11-14 NOTE — Assessment & Plan Note (Signed)
On Vit D 

## 2021-11-18 ENCOUNTER — Other Ambulatory Visit: Payer: Self-pay | Admitting: Internal Medicine

## 2021-11-18 MED ORDER — VITAMIN D (ERGOCALCIFEROL) 1.25 MG (50000 UNIT) PO CAPS: 50000.0000 [IU] | ORAL_CAPSULE | ORAL | 3 refills | Status: DC

## 2021-12-13 ENCOUNTER — Encounter: Payer: Self-pay | Admitting: Internal Medicine

## 2021-12-13 ENCOUNTER — Ambulatory Visit: Payer: BC Managed Care – PPO | Admitting: Internal Medicine

## 2021-12-13 DIAGNOSIS — M25511 Pain in right shoulder: Secondary | ICD-10-CM

## 2021-12-13 DIAGNOSIS — G4700 Insomnia, unspecified: Secondary | ICD-10-CM | POA: Diagnosis not present

## 2021-12-13 DIAGNOSIS — E6609 Other obesity due to excess calories: Secondary | ICD-10-CM | POA: Diagnosis not present

## 2021-12-13 DIAGNOSIS — G43509 Persistent migraine aura without cerebral infarction, not intractable, without status migrainosus: Secondary | ICD-10-CM

## 2021-12-13 DIAGNOSIS — G8929 Other chronic pain: Secondary | ICD-10-CM

## 2021-12-13 DIAGNOSIS — Z6833 Body mass index (BMI) 33.0-33.9, adult: Secondary | ICD-10-CM

## 2021-12-13 MED ORDER — PHENTERMINE HCL 37.5 MG PO CAPS: 37.5000 mg | ORAL_CAPSULE | ORAL | 2 refills | Status: DC

## 2021-12-13 NOTE — Assessment & Plan Note (Signed)
Will try Phentermine again ? Potential benefits of a long term stimulants use as well as potential risks  and complications were explained to the patient and were aknowledged. ?XIHWTU - not covered ?

## 2021-12-13 NOTE — Assessment & Plan Note (Signed)
Nurtec, Maxalt ?

## 2021-12-13 NOTE — Progress Notes (Signed)
? ?Subjective:  ?Patient ID: Angel [name], female    DOB: Mar 12, 1976  Age: 46 y.o. MRN: [id number] ? ?CC: No chief complaint on file. ? ? ?HPI ?Angel [name] presents for wt gain, obesity ?F/u HAs, anxiety ? ?Outpatient Medications Prior to Visit  ?Medication Sig Dispense Refill  ? acetaminophen (TYLENOL) 500 MG tablet Take 1,000 mg by mouth daily.     ? albuterol (VENTOLIN HFA) 108 (90 Base) MCG/ACT inhaler INHALE 2 PUFFS INTO THE LUNGS EVERY 6 HOURS AS NEEDED FOR WHEEZING OR SHORTNESS OF BREATH 6.7 g 3  ? betamethasone dipropionate 0.05 % cream Apply 1 application topically daily as needed (Eczema).     ? cephALEXin (KEFLEX) 500 MG capsule Take 1 capsule (500 mg total) by mouth 4 (four) times daily. 20 capsule 1  ? clindamycin (CLINDAGEL) 1 % gel Apply 1 application topically daily as needed (acne).   11  ? hydrocortisone (ANUSOL-HC) 2.5 % rectal cream Use bid (Patient taking differently: Place 1 application. rectally 2 (two) times daily as needed for hemorrhoids.) 30 g 1  ? hydrOXYzine (ATARAX/VISTARIL) 10 MG tablet Take 1-2 tablets after dinner for itch    ? LORazepam (ATIVAN) 1 MG tablet Take 1-2 tablets (1-2 mg total) by mouth at bedtime as needed for sleep. 60 tablet 3  ? Misc. Devices (ALL-BODY MASSAGE) MISC Please provide patient with 15 minutes of gentle, comforting massage in the shoulder, neck, upper back region every evening 30 minutes prior to bedtime.  Repeat as often as needed. 15 each 13  ? Rimegepant Sulfate (NURTEC) 75 MG TBDP 1 po qod prn 12 tablet 3  ? rizatriptan (MAXALT) 10 MG tablet Take 1 tablet (10 mg total) by mouth once as needed for up to 1 dose for migraine. May repeat in 2 hours if needed 12 tablet 12  ? spironolactone (ALDACTONE) 50 MG tablet Take 50 mg by mouth daily.     ? tiZANidine (ZANAFLEX) 4 MG tablet Take 4 mg by mouth daily as needed (Back pain).     ? promethazine (PHENERGAN) 25 MG tablet Take 1 tablet (25 mg total) by mouth every 4 (four) hours as needed for up to 7  days for nausea or vomiting. 30 tablet 3  ? aspirin EC 81 MG tablet Take 1 tablet (81 mg total) by mouth daily. 90 tablet 3  ? etodolac (LODINE XL) 400 MG 24 hr tablet Take 1 tablet (400 mg total) by mouth daily. 90 tablet 0  ? fluticasone (FLONASE) 50 MCG/ACT nasal spray Place 2 sprays into both nostrils daily. 16 g 6  ? gabapentin (NEURONTIN) 100 MG capsule Take by mouth. Take 1 By mouth twice a day    ? levocetirizine (XYZAL) 5 MG tablet Take 1 tablet (5 mg total) by mouth every evening. 30 tablet 1  ? meloxicam (MOBIC) 7.5 MG tablet Take 1 by mouth daily    ? nitroGLYCERIN (NITROSTAT) 0.4 MG SL tablet Place 1 tablet (0.4 mg total) under the tongue every 5 (five) minutes as needed for chest pain. 25 tablet 6  ? valACYclovir (VALTREX) 1000 MG tablet Take 1 tablet (1,000 mg total) by mouth 3 (three) times daily. 21 tablet 0  ? Vitamin D, Ergocalciferol, (DRISDOL) 1.25 MG (50000 UNIT) CAPS capsule Take 1 capsule (50,000 Units total) by mouth every 14 (fourteen) days. 6 capsule 3  ? ?No facility-administered medications prior to visit.  ? ? ?ROS: ?Review of Systems  ?Constitutional:  Positive for fatigue and unexpected weight change. Negative  for activity change, appetite change and chills.  ?HENT:  Negative for congestion, mouth sores and sinus pressure.   ?Eyes:  Negative for visual disturbance.  ?Respiratory:  Negative for cough and chest tightness.   ?Gastrointestinal:  Negative for abdominal pain and nausea.  ?Genitourinary:  Negative for difficulty urinating, frequency and vaginal pain.  ?Musculoskeletal:  Positive for arthralgias and back pain. Negative for gait problem.  ?Skin:  Negative for pallor and rash.  ?Neurological:  Negative for dizziness, tremors, weakness, numbness and headaches.  ?Psychiatric/Behavioral:  Positive for sleep disturbance. Negative for confusion. The patient is nervous/anxious.   ? ?Objective:  ?BP 100/60 (BP Location: Left Arm, Patient Position: Sitting, Cuff Size: Normal)   Pulse  68   Temp 98.3 ?F (36.8 ?C) (Oral)   Ht (P) 5\' 6"  (1.676 m)   Wt 207 lb (93.9 kg)   SpO2 99%   BMI (P) 33.41 kg/m?  ? ?BP Readings from Last 3 Encounters:  ?12/13/21 100/60  ?11/14/21 112/70  ?10/09/21 128/70  ? ? ?Wt Readings from Last 3 Encounters:  ?12/13/21 207 lb (93.9 kg)  ?11/14/21 207 lb (93.9 kg)  ?10/09/21 206 lb (93.4 kg)  ? ? ?Physical Exam ? ?Lab Results  ?Component Value Date  ? WBC 5.4 11/14/2021  ? HGB 12.7 11/14/2021  ? HCT 37.7 11/14/2021  ? PLT 254.0 11/14/2021  ? GLUCOSE 93 11/14/2021  ? CHOL 193 11/14/2021  ? TRIG 50.0 11/14/2021  ? HDL 75.40 11/14/2021  ? LDLCALC 108 (H) 11/14/2021  ? ALT 12 11/14/2021  ? AST 15 11/14/2021  ? NA 138 11/14/2021  ? K 4.1 11/14/2021  ? CL 104 11/14/2021  ? CREATININE 0.86 11/14/2021  ? BUN 12 11/14/2021  ? CO2 26 11/14/2021  ? TSH 1.70 11/14/2021  ? HGBA1C 5.6 10/18/2014  ? ? ?No results found. ? ?Assessment & Plan:  ? ?Problem List Items Addressed This Visit   ? ? Insomnia  ?  Blue-Emu cream was recommended to use 2-3 times a day for pain ?Lorazepm prn ? Potential benefits of a long term benzodiazepines  use as well as potential risks  and complications were explained to the patient and were aknowledged. ? ?  ?  ? Obesity  ?  Will try Phentermine again ? Potential benefits of a long term stimulants use as well as potential risks  and complications were explained to the patient and were aknowledged. ?12/18/2014 - not covered ? ?  ?  ? Relevant Medications  ? phentermine 37.5 MG capsule  ? Chronic right shoulder pain  ?  Blue-Emu cream was recommended to use 2-3 times a day ? ?  ?  ? Migraine headache  ?  Nurtec, Maxalt ?  ?  ?  ? ? ?Meds ordered this encounter  ?Medications  ? phentermine 37.5 MG capsule  ?  Sig: Take 1 capsule (37.5 mg total) by mouth every morning.  ?  Dispense:  30 capsule  ?  Refill:  2  ?  ? ? ?Follow-up: No follow-ups on file. ? ?Angel [name] ?

## 2021-12-13 NOTE — Assessment & Plan Note (Signed)
Blue-Emu cream was recommended to use 2-3 times a day ? ?

## 2021-12-13 NOTE — Assessment & Plan Note (Signed)
Blue-Emu cream was recommended to use 2-3 times a day for pain ?Lorazepm prn ? Potential benefits of a long term benzodiazepines  use as well as potential risks  and complications were explained to the patient and were aknowledged. ? ?

## 2021-12-13 NOTE — Patient Instructions (Signed)
Blue-Emu cream -- use 2-3 times a day ? ?

## 2021-12-14 ENCOUNTER — Telehealth: Payer: Self-pay | Admitting: *Deleted

## 2021-12-14 NOTE — Telephone Encounter (Signed)
Rec'd PA for Nurtec 75 mg. Completed on cover-my-meds w/ Key: [id number]. Waiting on insurance determination.Marland KitchenRaechel Chute ?

## 2021-12-18 NOTE — Telephone Encounter (Signed)
Rec'd determination fax med was approved effective 12/14/21 - 03/07/22. Faxed approval to pof w/ status.Marland KitchenRaechel Chute ?

## 2021-12-20 ENCOUNTER — Telehealth: Payer: Self-pay | Admitting: *Deleted

## 2021-12-20 NOTE — Telephone Encounter (Signed)
Rec'd fax from cover-my-meds was [place]. Completed on cover-my-meds w/ (Key: [id number]). Rec'd msg sent to plan.Marland KitchenRaechel Chute ?

## 2021-12-21 NOTE — Telephone Encounter (Signed)
Rec'd another questionnaire from insurance. Completed and fax back to [phone] ?

## 2021-12-22 NOTE — Telephone Encounter (Signed)
Rec'd determination fax med was denied it states does not meed the definition medical necessity found in plan.Marland KitchenRaechel Chute ? ?FYI.. Pt not taking not on meds list. Only Maxalt & Nurtec ? ? ?

## 2022-01-22 DIAGNOSIS — G245 Blepharospasm: Secondary | ICD-10-CM | POA: Diagnosis not present

## 2022-01-22 DIAGNOSIS — H04123 Dry eye syndrome of bilateral lacrimal glands: Secondary | ICD-10-CM | POA: Diagnosis not present

## 2022-01-22 DIAGNOSIS — H2513 Age-related nuclear cataract, bilateral: Secondary | ICD-10-CM | POA: Diagnosis not present

## 2022-01-22 DIAGNOSIS — H40023 Open angle with borderline findings, high risk, bilateral: Secondary | ICD-10-CM | POA: Diagnosis not present

## 2022-01-22 DIAGNOSIS — H524 Presbyopia: Secondary | ICD-10-CM | POA: Diagnosis not present

## 2022-01-29 DIAGNOSIS — M5459 Other low back pain: Secondary | ICD-10-CM | POA: Diagnosis not present

## 2022-01-29 DIAGNOSIS — M47816 Spondylosis without myelopathy or radiculopathy, lumbar region: Secondary | ICD-10-CM | POA: Diagnosis not present

## 2022-01-31 ENCOUNTER — Telehealth: Payer: Self-pay | Admitting: *Deleted

## 2022-01-31 ENCOUNTER — Other Ambulatory Visit: Payer: Self-pay | Admitting: Internal Medicine

## 2022-01-31 NOTE — Telephone Encounter (Signed)
Rec'd another fax determination from [facility] the Nurtec was approved. Fax approval to pof.Marland KitchenRaechel Chute

## 2022-01-31 NOTE — Telephone Encounter (Signed)
Pt was on cover-my-meds need PA on Nurtec. Submitted w/ (Key: [id number]). Waiting on insurance response,,,/lmb

## 2022-01-31 NOTE — Telephone Encounter (Signed)
Check [place] registry last filled 12/06/2021../lmb  

## 2022-01-31 NOTE — Telephone Encounter (Signed)
Rec'd msg PA Cancelled today Auth on file med has been denied. Pt plan does not cover meds...Raechel Chute

## 2022-02-02 DIAGNOSIS — R87610 Atypical squamous cells of undetermined significance on cytologic smear of cervix (ASC-US): Secondary | ICD-10-CM | POA: Insufficient documentation

## 2022-02-07 DIAGNOSIS — Z1231 Encounter for screening mammogram for malignant neoplasm of breast: Secondary | ICD-10-CM | POA: Diagnosis not present

## 2022-02-07 DIAGNOSIS — Z1211 Encounter for screening for malignant neoplasm of colon: Secondary | ICD-10-CM | POA: Diagnosis not present

## 2022-02-07 DIAGNOSIS — Z01419 Encounter for gynecological examination (general) (routine) without abnormal findings: Secondary | ICD-10-CM | POA: Diagnosis not present

## 2022-02-07 DIAGNOSIS — Z124 Encounter for screening for malignant neoplasm of cervix: Secondary | ICD-10-CM | POA: Diagnosis not present

## 2022-02-14 ENCOUNTER — Other Ambulatory Visit: Payer: Self-pay | Admitting: Obstetrics and Gynecology

## 2022-02-14 DIAGNOSIS — R2231 Localized swelling, mass and lump, right upper limb: Secondary | ICD-10-CM

## 2022-02-21 ENCOUNTER — Other Ambulatory Visit: Payer: Self-pay | Admitting: Obstetrics and Gynecology

## 2022-02-21 ENCOUNTER — Ambulatory Visit
Admission: RE | Admit: 2022-02-21 | Discharge: 2022-02-21 | Disposition: A | Discharge status: Home / Self Care | Payer: BC Managed Care – PPO | Source: Ambulatory Visit | Attending: Obstetrics and Gynecology | Admitting: Obstetrics and Gynecology

## 2022-02-21 DIAGNOSIS — R599 Enlarged lymph nodes, unspecified: Secondary | ICD-10-CM

## 2022-02-21 DIAGNOSIS — R2231 Localized swelling, mass and lump, right upper limb: Secondary | ICD-10-CM

## 2022-02-21 DIAGNOSIS — R922 Inconclusive mammogram: Secondary | ICD-10-CM | POA: Diagnosis not present

## 2022-02-21 DIAGNOSIS — N6001 Solitary cyst of right breast: Secondary | ICD-10-CM | POA: Diagnosis not present

## 2022-02-23 ENCOUNTER — Telehealth: Payer: Self-pay | Admitting: *Deleted

## 2022-02-23 NOTE — Telephone Encounter (Signed)
Pt was on cover-my-meds w/  Key: [id number] . Rec'd msg  PA sent to plan..lmb

## 2022-02-26 NOTE — Telephone Encounter (Signed)
Rec'd fax questionnaire from [place]... Filled paperwork out and faxed back to [phone].Marland KitchenRaechel Chute

## 2022-02-27 ENCOUNTER — Ambulatory Visit
Admission: RE | Admit: 2022-02-27 | Discharge: 2022-02-27 | Disposition: A | Discharge status: Home / Self Care | Payer: BC Managed Care – PPO | Source: Ambulatory Visit | Attending: Obstetrics and Gynecology | Admitting: Obstetrics and Gynecology

## 2022-02-27 ENCOUNTER — Other Ambulatory Visit: Payer: Self-pay | Admitting: Obstetrics and Gynecology

## 2022-02-27 DIAGNOSIS — R2231 Localized swelling, mass and lump, right upper limb: Secondary | ICD-10-CM | POA: Diagnosis not present

## 2022-02-27 DIAGNOSIS — R599 Enlarged lymph nodes, unspecified: Secondary | ICD-10-CM

## 2022-02-27 DIAGNOSIS — N6011 Diffuse cystic mastopathy of right breast: Secondary | ICD-10-CM | POA: Diagnosis not present

## 2022-02-27 DIAGNOSIS — N6331 Unspecified lump in axillary tail of the right breast: Secondary | ICD-10-CM

## 2022-03-01 DIAGNOSIS — N6489 Other specified disorders of breast: Secondary | ICD-10-CM | POA: Insufficient documentation

## 2022-03-05 NOTE — Telephone Encounter (Signed)
Rec'd determination"  This request has received a Favorable outcome from [facility] [place]." Effective from 02/23/2022 through 02/23/2023. Faxed approval to pof../lm,b

## 2022-03-21 ENCOUNTER — Encounter: Payer: Self-pay | Admitting: Internal Medicine

## 2022-03-21 ENCOUNTER — Ambulatory Visit (INDEPENDENT_AMBULATORY_CARE_PROVIDER_SITE_OTHER): Payer: BC Managed Care – PPO | Admitting: Internal Medicine

## 2022-03-21 DIAGNOSIS — N611 Abscess of the breast and nipple: Secondary | ICD-10-CM | POA: Insufficient documentation

## 2022-03-21 DIAGNOSIS — E559 Vitamin D deficiency, unspecified: Secondary | ICD-10-CM | POA: Diagnosis not present

## 2022-03-21 DIAGNOSIS — M25511 Pain in right shoulder: Secondary | ICD-10-CM | POA: Diagnosis not present

## 2022-03-21 DIAGNOSIS — G43509 Persistent migraine aura without cerebral infarction, not intractable, without status migrainosus: Secondary | ICD-10-CM | POA: Diagnosis not present

## 2022-03-21 DIAGNOSIS — M25519 Pain in unspecified shoulder: Secondary | ICD-10-CM | POA: Insufficient documentation

## 2022-03-21 MED ORDER — TRAMADOL HCL 50 MG PO TABS: 50.0000 mg | ORAL_TABLET | Freq: Four times a day (QID) | ORAL | 0 refills | Status: DC | PRN

## 2022-03-21 MED ORDER — METHYLPREDNISOLONE 4 MG PO TBPK: ORAL_TABLET | ORAL | 0 refills | Status: DC

## 2022-03-21 NOTE — Patient Instructions (Signed)
Blue-Emu cream -- use 2-3 times a day ? ?

## 2022-03-21 NOTE — Assessment & Plan Note (Addendum)
New Severe right shoulder pain  Medrol pack Blue-Emu cream was recommended to use 2-3 times a day Tramadol prn  Potential benefits of a long term opioids use as well as potential risks (i.e. addiction risk, apnea etc) and complications (i.e. Somnolence, constipation and others) were explained to the patient and were aknowledged.

## 2022-03-21 NOTE — Progress Notes (Addendum)
Subjective:  Patient ID: Angel [name], female    DOB: 1975-09-24  Age: 46 y.o. MRN: [id number]  CC: 3 month f/u (Right shoulder pain, she has taken otc tylenol arthritis. Her headaches are not as bad. )   HPI Angel [name] presents for R shoulder pain.  Follow-up on headaches   Outpatient Medications Prior to Visit  Medication Sig Dispense Refill   acetaminophen (TYLENOL) 500 MG tablet Take 1,000 mg by mouth daily.      albuterol (VENTOLIN HFA) 108 (90 Base) MCG/ACT inhaler INHALE 2 PUFFS INTO THE LUNGS EVERY 6 HOURS AS NEEDED FOR WHEEZING OR SHORTNESS OF BREATH 6.7 g 3   betamethasone dipropionate 0.05 % cream Apply 1 application topically daily as needed (Eczema).      cephALEXin (KEFLEX) 500 MG capsule Take 1 capsule (500 mg total) by mouth 4 (four) times daily. 20 capsule 1   clindamycin (CLINDAGEL) 1 % gel Apply 1 application topically daily as needed (acne).   11   hydrocortisone (ANUSOL-HC) 2.5 % rectal cream Use bid (Patient taking differently: Place 1 application  rectally 2 (two) times daily as needed for hemorrhoids.) 30 g 1   hydrOXYzine (ATARAX/VISTARIL) 10 MG tablet Take 1-2 tablets after dinner for itch     LORazepam (ATIVAN) 1 MG tablet TAKE 1 TO 2 TABLETS BY MOUTH AT BEDTIME AS NEEDED FOR SLEEP 60 tablet 3   Misc. Devices (ALL-BODY MASSAGE) MISC Please provide patient with 15 minutes of gentle, comforting massage in the shoulder, neck, upper back region every evening 30 minutes prior to bedtime.  Repeat as often as needed. 15 each 13   phentermine 37.5 MG capsule Take 1 capsule (37.5 mg total) by mouth every morning. 30 capsule 2   Rimegepant Sulfate (NURTEC) 75 MG TBDP 1 po qod prn 12 tablet 3   rizatriptan (MAXALT) 10 MG tablet Take 1 tablet (10 mg total) by mouth once as needed for up to 1 dose for migraine. May repeat in 2 hours if needed 12 tablet 12   spironolactone (ALDACTONE) 50 MG tablet Take 50 mg by mouth daily.      tiZANidine (ZANAFLEX) 4 MG tablet Take  4 mg by mouth daily as needed (Back pain).      promethazine (PHENERGAN) 25 MG tablet Take 1 tablet (25 mg total) by mouth every 4 (four) hours as needed for up to 7 days for nausea or vomiting. 30 tablet 3   No facility-administered medications prior to visit.    ROS: Review of Systems  Constitutional:  Negative for activity change, appetite change, chills, fatigue and unexpected weight change.  HENT:  Negative for congestion, mouth sores and sinus pressure.   Eyes:  Negative for visual disturbance.  Respiratory:  Negative for cough and chest tightness.   Gastrointestinal:  Negative for abdominal pain and nausea.  Genitourinary:  Negative for difficulty urinating, frequency and vaginal pain.  Musculoskeletal:  Positive for arthralgias. Negative for back pain and gait problem.  Skin:  Negative for pallor and rash.  Neurological:  Negative for dizziness, tremors, weakness, numbness and headaches.  Psychiatric/Behavioral:  Positive for sleep disturbance. Negative for confusion and suicidal ideas.     Objective:  BP 118/70   Pulse (!) 56   Temp 97.9 F (36.6 C) (Oral)   Ht 5' 6" (1.676 m)   Wt 198 lb 3.2 oz (89.9 kg)   SpO2 95%   BMI 31.99 kg/m   BP Readings from Last 3 Encounters:  03/21/22 118/70  12/13/21 100/60  11/14/21 112/70    Wt Readings from Last 3 Encounters:  03/21/22 198 lb 3.2 oz (89.9 kg)  12/13/21 207 lb (93.9 kg)  11/14/21 207 lb (93.9 kg)    Physical Exam Constitutional:      General: She is not in acute distress.    Appearance: She is well-developed. She is obese.  HENT:     Head: Normocephalic.     Right Ear: External ear normal.     Left Ear: External ear normal.     Nose: Nose normal.  Eyes:     General:        Right eye: No discharge.        Left eye: No discharge.     Conjunctiva/sclera: Conjunctivae normal.     Pupils: Pupils are equal, round, and reactive to light.  Neck:     Thyroid: No thyromegaly.     Vascular: No JVD.      Trachea: No tracheal deviation.  Cardiovascular:     Rate and Rhythm: Normal rate and regular rhythm.     Heart sounds: Normal heart sounds.  Pulmonary:     Effort: No respiratory distress.     Breath sounds: No stridor. No wheezing.  Abdominal:     General: Bowel sounds are normal. There is no distension.     Palpations: Abdomen is soft. There is no mass.     Tenderness: There is no abdominal tenderness. There is no guarding or rebound.  Musculoskeletal:        General: Tenderness present.     Cervical back: Normal range of motion and neck supple. No rigidity.  Lymphadenopathy:     Cervical: No cervical adenopathy.  Skin:    Findings: No erythema or rash.  Neurological:     Cranial Nerves: No cranial nerve deficit.     Motor: No abnormal muscle tone.     Coordination: Coordination normal.     Deep Tendon Reflexes: Reflexes normal.  Psychiatric:        Behavior: Behavior normal.        Thought Content: Thought content normal.        Judgment: Judgment normal.    Right shoulder painful with range of motion.  Right trap is tight Lab Results  Component Value Date   WBC 5.4 11/14/2021   HGB 12.7 11/14/2021   HCT 37.7 11/14/2021   PLT 254.0 11/14/2021   GLUCOSE 93 11/14/2021   CHOL 193 11/14/2021   TRIG 50.0 11/14/2021   HDL 75.40 11/14/2021   LDLCALC 108 (H) 11/14/2021   ALT 12 11/14/2021   AST 15 11/14/2021   NA 138 11/14/2021   K 4.1 11/14/2021   CL 104 11/14/2021   CREATININE 0.86 11/14/2021   BUN 12 11/14/2021   CO2 26 11/14/2021   TSH 1.70 11/14/2021   HGBA1C 5.6 10/18/2014    [place] AXILLARY NODE CORE BIOPSY RIGHT  Addendum Date: 02/28/2022   ADDENDUM REPORT: 02/28/2022 15:02 ADDENDUM: Pathology revealed Breast, RIGHT, needle core biopsy, axilla, (hydromark clip): FIBROCYSTIC CHANGE WITH ADENOSIS AND FOCAL PSEUDOANGIOMATOUS STROMAL HYPERPLASIA- NEGATIVE FOR CARCINOMA. This was found to be concordant by Dr. Audie Pinto. Pathology results were discussed with  the patient by telephone. The patient reported doing well after the biopsy with tenderness at the site. Post biopsy instructions and care were reviewed and questions were answered. The patient was encouraged to call [facility] for any additional concerns. The patient was instructed to return for annual  screening mammography and informed a reminder notice would be sent regarding this appointment. Pathology results reported by Stacie Acres RN on 02/28/2022. Electronically Signed   By: Audie Pinto M.D.   On: 02/28/2022 15:02   Result Date: 02/28/2022 CLINICAL DATA:  46 year old female presenting for biopsy of a mass in the right axilla EXAM: [place] AXILLARY NODE CORE BIOPSY RIGHT COMPARISON:  Previous exam(s). PROCEDURE: I met with the patient and we discussed the procedure of ultrasound-guided biopsy, including benefits and alternatives. We discussed the high likelihood of a successful procedure. We discussed the risks of the procedure, including infection, bleeding, tissue injury, clip migration, and inadequate sampling. Informed written consent was given. The usual time-out protocol was performed immediately prior to the procedure. Using sterile technique and 1% Lidocaine as local anesthetic, under direct ultrasound visualization, a 14 gauge spring-loaded device was used to perform biopsy of a mass in the right axilla using a lateral approach. At the conclusion of the procedure a HydroMARK tissue marker clip was deployed into the biopsy cavity. Follow up 2 view mammogram was performed and dictated separately. IMPRESSION: Ultrasound guided biopsy of a mass in the right axilla. No apparent complications. Electronically Signed: By: Audie Pinto M.D. On: 02/27/2022 09:38  MM CLIP PLACEMENT RIGHT  Result Date: 02/27/2022 CLINICAL DATA:  Post procedure mammogram for clip placement EXAM: 3D DIAGNOSTIC RIGHT MAMMOGRAM POST ULTRASOUND BIOPSY COMPARISON:  Previous exam(s). FINDINGS: 3D  Mammographic images were obtained following ultrasound guided biopsy of a mass in the right axilla. The biopsy marking clip is in expected position at the site of biopsy. IMPRESSION: Appropriate positioning of the HydroMARK shaped biopsy marking clip at the site of biopsy in the right axilla. Final Assessment: Post Procedure Mammograms for Marker Placement Electronically Signed   By: Audie Pinto M.D.   On: 02/27/2022 09:38   Assessment & Plan:   Problem List Items Addressed This Visit     Migraine headache    Better.  Continue with Nurtec, Maxalt as needed      Relevant Medications   traMADol (ULTRAM) 50 MG tablet   Shoulder pain    New Severe right shoulder pain  Medrol pack Blue-Emu cream was recommended to use 2-3 times a day Tramadol prn  Potential benefits of a long term opioids use as well as potential risks (i.e. addiction risk, apnea etc) and complications (i.e. Somnolence, constipation and others) were explained to the patient and were aknowledged.        Vitamin D deficiency    Continue with vitamin D         Meds ordered this encounter  Medications   methylPREDNISolone (MEDROL DOSEPAK) 4 MG TBPK tablet    Sig: As directed    Dispense:  21 tablet    Refill:  0   traMADol (ULTRAM) 50 MG tablet    Sig: Take 1 tablet (50 mg total) by mouth every 6 (six) hours as needed for severe pain.    Dispense:  20 tablet    Refill:  0      Follow-up: Return for a follow-up visit.  Walker Kehr, MD

## 2022-03-26 NOTE — Assessment & Plan Note (Signed)
Continue with vitamin D 

## 2022-03-26 NOTE — Assessment & Plan Note (Addendum)
Better.  Continue with Nurtec, Maxalt as needed

## 2022-06-01 DIAGNOSIS — K5900 Constipation, unspecified: Secondary | ICD-10-CM | POA: Diagnosis not present

## 2022-07-15 DIAGNOSIS — R0981 Nasal congestion: Secondary | ICD-10-CM | POA: Diagnosis not present

## 2022-07-15 DIAGNOSIS — J029 Acute pharyngitis, unspecified: Secondary | ICD-10-CM | POA: Diagnosis not present

## 2022-07-15 DIAGNOSIS — R509 Fever, unspecified: Secondary | ICD-10-CM | POA: Diagnosis not present

## 2022-07-25 ENCOUNTER — Other Ambulatory Visit: Payer: Self-pay | Admitting: Internal Medicine

## 2022-08-15 ENCOUNTER — Encounter: Payer: Self-pay | Admitting: Internal Medicine

## 2022-08-15 ENCOUNTER — Ambulatory Visit: Payer: BC Managed Care – PPO | Admitting: Internal Medicine

## 2022-08-15 VITALS — BP 124/78 | HR 65 | Temp 98.5°F | Ht 66.0 in | Wt 206.0 lb

## 2022-08-15 DIAGNOSIS — G8929 Other chronic pain: Secondary | ICD-10-CM

## 2022-08-15 DIAGNOSIS — M25511 Pain in right shoulder: Secondary | ICD-10-CM

## 2022-08-15 MED ORDER — METHYLPREDNISOLONE ACETATE 40 MG/ML IJ SUSP
40.0000 mg | Freq: Once | INTRAMUSCULAR | Status: AC
  Administered 2022-08-15: 40 mg via INTRAMUSCULAR

## 2022-08-15 MED ORDER — MELOXICAM 15 MG PO TABS: 15.0000 mg | ORAL_TABLET | Freq: Every day | ORAL | 0 refills | Status: DC

## 2022-08-15 NOTE — Assessment & Plan Note (Signed)
Not better Subacromial bursitis Will inject Meloxicam x 2-4 weeks Sports Med ref

## 2022-08-15 NOTE — Patient Instructions (Signed)
Postprocedure instructions :    A Band-Aid should be left on for 12 hours. Injection therapy is not a cure itself. It is used in conjunction with other modalities. You can use nonsteroidal anti-inflammatories like ibuprofen , hot and cold compresses. Rest is recommended in the next 24 hours. You need to report immediately  if fever, chills or any signs of infection develop. 

## 2022-08-15 NOTE — Progress Notes (Signed)
`  Subjective:  Patient ID: Angel [name], female    DOB: March 27, 1976  Age: 47 y.o. MRN: [id number]  CC: Shoulder Pain (Right shoulder pain has gotten worse )   HPI Angel [name] presents for R shoulder pain - chronic; worse   Outpatient Medications Prior to Visit  Medication Sig Dispense Refill   acetaminophen (TYLENOL) 500 MG tablet Take 1,000 mg by mouth daily.      albuterol (VENTOLIN HFA) 108 (90 Base) MCG/ACT inhaler INHALE 2 PUFFS INTO THE LUNGS EVERY 6 HOURS AS NEEDED FOR WHEEZING OR SHORTNESS OF BREATH 6.7 g 3   benzonatate (TESSALON) 200 MG capsule Take by mouth.     betamethasone dipropionate 0.05 % cream Apply 1 application topically daily as needed (Eczema).      clindamycin (CLINDAGEL) 1 % gel Apply 1 application topically daily as needed (acne).   11   hydrocortisone (ANUSOL-HC) 2.5 % rectal cream Use bid (Patient taking differently: Place 1 application  rectally 2 (two) times daily as needed for hemorrhoids.) 30 g 1   hydrOXYzine (ATARAX/VISTARIL) 10 MG tablet Take 1-2 tablets after dinner for itch     ipratropium (ATROVENT) 0.06 % nasal spray Place 2 sprays into both nostrils 2 (two) times daily.     LORazepam (ATIVAN) 1 MG tablet TAKE 1 TO 2 TABLETS BY MOUTH AT BEDTIME AS NEEDED FOR SLEEP 60 tablet 3   methylPREDNISolone (MEDROL DOSEPAK) 4 MG TBPK tablet As directed 21 tablet 0   Misc. Devices (ALL-BODY MASSAGE) MISC Please provide patient with 15 minutes of gentle, comforting massage in the shoulder, neck, upper back region every evening 30 minutes prior to bedtime.  Repeat as often as needed. 15 each 13   phentermine 37.5 MG capsule Take 1 capsule (37.5 mg total) by mouth every morning. 30 capsule 2   promethazine-dextromethorphan (PROMETHAZINE-DM) 6.25-15 MG/5ML syrup Take 5 mLs by mouth 3 (three) times daily as needed.     Rimegepant Sulfate (NURTEC) 75 MG TBDP 1 po qod prn 12 tablet 3   rizatriptan (MAXALT) 10 MG tablet Take 1 tablet (10 mg total) by mouth once  as needed for up to 1 dose for migraine. May repeat in 2 hours if needed 12 tablet 12   spironolactone (ALDACTONE) 50 MG tablet Take 50 mg by mouth daily.      tiZANidine (ZANAFLEX) 4 MG tablet Take 4 mg by mouth daily as needed (Back pain).      traMADol (ULTRAM) 50 MG tablet Take 1 tablet (50 mg total) by mouth every 6 (six) hours as needed for severe pain. 20 tablet 0   Vitamin D, Ergocalciferol, (DRISDOL) 1.25 MG (50000 UNIT) CAPS capsule Take 50,000 Units by mouth every 14 (fourteen) days.     cephALEXin (KEFLEX) 500 MG capsule Take 1 capsule (500 mg total) by mouth 4 (four) times daily. (Patient not taking: Reported on 08/15/2022) 20 capsule 1   promethazine (PHENERGAN) 25 MG tablet Take 1 tablet (25 mg total) by mouth every 4 (four) hours as needed for up to 7 days for nausea or vomiting. 30 tablet 3   No facility-administered medications prior to visit.    ROS: Review of Systems  Constitutional:  Negative for activity change, appetite change, chills, fatigue and unexpected weight change.  HENT:  Negative for congestion, mouth sores and sinus pressure.   Eyes:  Negative for visual disturbance.  Respiratory:  Negative for cough and chest tightness.   Gastrointestinal:  Negative for abdominal pain and nausea.  Genitourinary:  Negative for difficulty urinating, frequency and vaginal pain.  Musculoskeletal:  Positive for arthralgias. Negative for back pain and gait problem.  Skin:  Negative for pallor and rash.  Neurological:  Negative for dizziness, tremors, weakness, numbness and headaches.  Psychiatric/Behavioral:  Negative for confusion and sleep disturbance.     Objective:  BP 124/78 (BP Location: Left Arm, Patient Position: Sitting, Cuff Size: Normal)   Pulse 65   Temp 98.5 F (36.9 C) (Oral)   Ht _0  (1.676 m)   Wt 206 lb (93.4 kg)   SpO2 100%   BMI 33.25 kg/m   BP Readings from Last 3 Encounters:  08/15/22 124/78  03/21/22 118/70  12/13/21 100/60    Wt Readings  from Last 3 Encounters:  08/15/22 206 lb (93.4 kg)  03/21/22 198 lb 3.2 oz (89.9 kg)  12/13/21 207 lb (93.9 kg)    Physical Exam Constitutional:      General: Angel [name] is not in acute distress.    Appearance: Angel [name] is well-developed. Angel [name] is obese.  HENT:     Head: Normocephalic.     Right Ear: External ear normal.     Left Ear: External ear normal.     Nose: Nose normal.  Eyes:     General:        Right eye: No discharge.        Left eye: No discharge.     Conjunctiva/sclera: Conjunctivae normal.     Pupils: Pupils are equal, round, and reactive to light.  Neck:     Thyroid: No thyromegaly.     Vascular: No JVD.     Trachea: No tracheal deviation.  Cardiovascular:     Rate and Rhythm: Normal rate and regular rhythm.     Heart sounds: Normal heart sounds.  Pulmonary:     Effort: No respiratory distress.     Breath sounds: No stridor. No wheezing.  Abdominal:     General: Bowel sounds are normal. There is no distension.     Palpations: Abdomen is soft. There is no mass.     Tenderness: There is no abdominal tenderness. There is no guarding or rebound.  Musculoskeletal:        General: Tenderness present.     Cervical back: Normal range of motion and neck supple. No rigidity.  Lymphadenopathy:     Cervical: No cervical adenopathy.  Skin:    Findings: No erythema or rash.  Neurological:     Cranial Nerves: No cranial nerve deficit.     Motor: No abnormal muscle tone.     Coordination: Coordination normal.     Deep Tendon Reflexes: Reflexes normal.  Psychiatric:        Behavior: Behavior normal.        Thought Content: Thought content normal.        Judgment: Judgment normal.   R shoulder - pain w/ROM    Procedure :Joint Injection,  R shoulder   Indication:  Subacromial bursitis with refractory  chronic pain.   Risks including unsuccessful procedure , bleeding, infection, bruising, skin atrophy, "steroid flare-up" and others were explained to the patient in detail as  well as the benefits. Informed consent was obtained and signed.   Tthe patient was placed in a comfortable position. Lateral approach was used. Skin was prepped with Betadine and alcohol  and anesthetized with a cooling spray. Then, a 5 cc syringe with a 2 inch long 24-gauge needle was used for a joint injection.. The needle was  advanced  Into the subacromial space.The bursa was injected with 3 mL of 2% lidocaine and 40 mg of Depo-Medrol .  Band-Aid was applied.   Tolerated well. Complications: None. Good pain relief following the procedure.   Postprocedure instructions :    A Band-Aid should be left on for 12 hours. Injection therapy is not a cure itself. It is used in conjunction with other modalities. You can use nonsteroidal anti-inflammatories like ibuprofen , hot and cold compresses. Rest is recommended in the next 24 hours. You need to report immediately  if fever, chills or any signs of infection develop.  Lab Results  Component Value Date   WBC 5.4 11/14/2021   HGB 12.7 11/14/2021   HCT 37.7 11/14/2021   PLT 254.0 11/14/2021   GLUCOSE 93 11/14/2021   CHOL 193 11/14/2021   TRIG 50.0 11/14/2021   HDL 75.40 11/14/2021   LDLCALC 108 (H) 11/14/2021   ALT 12 11/14/2021   AST 15 11/14/2021   NA 138 11/14/2021   K 4.1 11/14/2021   CL 104 11/14/2021   CREATININE 0.86 11/14/2021   BUN 12 11/14/2021   CO2 26 11/14/2021   TSH 1.70 11/14/2021   HGBA1C 5.6 10/18/2014    [place] AXILLARY NODE CORE BIOPSY RIGHT  Addendum Date: 02/28/2022   ADDENDUM REPORT: 02/28/2022 15:02 ADDENDUM: Pathology revealed Breast, RIGHT, needle core biopsy, axilla, (hydromark clip): FIBROCYSTIC CHANGE WITH ADENOSIS AND FOCAL PSEUDOANGIOMATOUS STROMAL HYPERPLASIA- NEGATIVE FOR CARCINOMA. This was found to be concordant by Dr. Audie Pinto. Pathology results were discussed with the patient by telephone. The patient reported doing well after the biopsy with tenderness at the site. Post biopsy instructions and care  were reviewed and questions were answered. The patient was encouraged to call [facility] for any additional concerns. The patient was instructed to return for annual screening mammography and informed a reminder notice would be sent regarding this appointment. Pathology results reported by Stacie Acres RN on 02/28/2022. Electronically Signed   By: Audie Pinto M.D.   On: 02/28/2022 15:02   Result Date: 02/28/2022 CLINICAL DATA:  47 year old female presenting for biopsy of a mass in the right axilla EXAM: [place] AXILLARY NODE CORE BIOPSY RIGHT COMPARISON:  Previous exam(s). PROCEDURE: I met with the patient and we discussed the procedure of ultrasound-guided biopsy, including benefits and alternatives. We discussed the high likelihood of a successful procedure. We discussed the risks of the procedure, including infection, bleeding, tissue injury, clip migration, and inadequate sampling. Informed written consent was given. The usual time-out protocol was performed immediately prior to the procedure. Using sterile technique and 1% Lidocaine as local anesthetic, under direct ultrasound visualization, a 14 gauge spring-loaded device was used to perform biopsy of a mass in the right axilla using a lateral approach. At the conclusion of the procedure a HydroMARK tissue marker clip was deployed into the biopsy cavity. Follow up 2 view mammogram was performed and dictated separately. IMPRESSION: Ultrasound guided biopsy of a mass in the right axilla. No apparent complications. Electronically Signed: By: Audie Pinto M.D. On: 02/27/2022 09:38  MM CLIP PLACEMENT RIGHT  Result Date: 02/27/2022 CLINICAL DATA:  Post procedure mammogram for clip placement EXAM: 3D DIAGNOSTIC RIGHT MAMMOGRAM POST ULTRASOUND BIOPSY COMPARISON:  Previous exam(s). FINDINGS: 3D Mammographic images were obtained following ultrasound guided biopsy of a mass in the right axilla. The biopsy marking clip is in expected  position at the site of biopsy. IMPRESSION: Appropriate positioning of the HydroMARK shaped biopsy marking clip  at the site of biopsy in the right axilla. Final Assessment: Post Procedure Mammograms for Marker Placement Electronically Signed   By: Audie Pinto M.D.   On: 02/27/2022 09:38   Assessment & Plan:   Problem List Items Addressed This Visit     Shoulder pain - Primary    Not better Subacromial bursitis Will inject Meloxicam x 2-4 weeks Sports Med ref      Relevant Orders   Ambulatory referral to Sports Medicine      Meds ordered this encounter  Medications   meloxicam (MOBIC) 15 MG tablet    Sig: Take 1 tablet (15 mg total) by mouth daily.    Dispense:  30 tablet    Refill:  0   methylPREDNISolone acetate (DEPO-MEDROL) injection 40 mg      Follow-up: Return in about 3 months (around 11/14/2022) for a follow-up visit.  Walker Kehr, MD

## 2022-09-06 NOTE — Progress Notes (Signed)
Angel [name] [address] [place] Phone: [phone] Subjective:   IVilma [name], am serving as Angel scribe for Dr. Hulan [name].  I'm seeing this patient by the request  of:  Angel, Angel Lacks, MD  CC: Right shoulder pain  [id number]  [name] is Angel 47 y.o. female coming in with complaint of right shoulder pain.  Patient was seen by primary care and diagnosed with more of Angel subacromial bursitis.  Given injection January 3. Chronic shoulder pain over the last 2 years. Extremely bothersome. Injection took the edge off, and has been helping. Pain is in posterior shoulder.      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 Compliance was encouraged.     Past Surgical History:  Procedure Laterality Date   BILATERAL SALPINGECTOMY  09/26/2015   Procedure: BILATERAL SALPINGECTOMY;  Surgeon: Everett Graff, MD;  Location: [facility];  Service:  Gynecology;;   BREAST BIOPSY Bilateral 07/13/2013   Procedure: BILATERAL EXCISION BREAS MASSES;  Surgeon: Harl Bowie, MD;  Location: [facility];  Service: General;  Laterality: Bilateral;   BREAST CYST EXCISION Left 2014   4 cysts removed   DE QUERVAIN'S RELEASE Right 09/06/2003   DILITATION & CURRETTAGE/HYSTROSCOPY WITH NOVASURE ABLATION N/Angel 09/26/2015   Procedure: DILATATION & CURETTAGE/HYSTEROSCOPY WITH NOVASURE ABLATION;  Surgeon: Everett Graff, MD;  Location: [facility];  Service: Gynecology;  Laterality: N/Angel;   LAPAROSCOPIC OVARIAN CYSTECTOMY Right 09/26/2015   Procedure: LAPAROSCOPIC Right OVARIAN CYSTECTOMY, Bilateral Peritoneal Biopsies over Uteralsacral area;  Surgeon: Everett Graff, MD;  Location: [facility];  Service: Gynecology;  Laterality: Right;   LEFT HEART CATH AND CORONARY ANGIOGRAPHY N/Angel 12/30/2019   Procedure: LEFT HEART CATH AND CORONARY ANGIOGRAPHY;  Surgeon: Leonie Man, MD;  Location: [facility];  Service: Cardiovascular;  Laterality: N/Angel;   SPINAL FUSION  07/05/2018   TUBAL LIGATION  12/27/2005   WISDOM TOOTH EXTRACTION     WRIST GANGLION EXCISION Right 09/06/2003   WRIST GANGLION EXCISION Left 09/04/1999   Social History   Socioeconomic History   Marital status: Married    Spouse name: Not on file   Number of children: 2   Years of education: Not on file   Highest education level: Not on file  Occupational History   Not on file  Tobacco Use   Smoking status: Never   Smokeless tobacco: Never  Substance and Sexual  Activity   Alcohol use: No   Drug use: No   Sexual activity: Not on file    Comment: BTL  Other Topics Concern   Not on file  Social History Narrative   Not on file   Social Determinants of Health   Financial Resource Strain: Not on file  Food Insecurity: Not on file  Transportation Needs: Not on file  Physical Activity: Not on file  Stress: Not on file  Social Connections: Not on file   Allergies  Allergen  Reactions   Other Dermatitis    Surgical glues Surgical glues   Butalbital-Acetaminophen     Did not like the effect   Trazodone And Nefazodone    Wound Dressing Adhesive     Other reaction(s): Rash:itching   Zolpidem     Ate in her sleep   Family History  Problem Relation Age of Onset   Hypertension Father    Diabetes Father    Heart disease Father    Cancer Father        prostate   Sickle cell anemia Daughter    Sickle cell anemia Son     Current Outpatient Medications (Endocrine & Metabolic):    methylPREDNISolone (MEDROL DOSEPAK) 4 MG TBPK tablet, As directed  Current Outpatient Medications (Cardiovascular):    spironolactone (ALDACTONE) 50 MG tablet, Take 50 mg by mouth daily.   Current Outpatient Medications (Respiratory):    albuterol (VENTOLIN HFA) 108 (90 Base) MCG/ACT inhaler, INHALE 2 PUFFS INTO THE LUNGS EVERY 6 HOURS AS NEEDED FOR WHEEZING OR SHORTNESS OF BREATH   benzonatate (TESSALON) 200 MG capsule, Take by mouth.   ipratropium (ATROVENT) 0.06 % nasal spray, Place 2 sprays into both nostrils 2 (two) times daily.   promethazine (PHENERGAN) 25 MG tablet, Take 1 tablet (25 mg total) by mouth every 4 (four) hours as needed for up to 7 days for nausea or vomiting.   promethazine-dextromethorphan (PROMETHAZINE-DM) 6.25-15 MG/5ML syrup, Take 5 mLs by mouth 3 (three) times daily as needed.  Current Outpatient Medications (Analgesics):    acetaminophen (TYLENOL) 500 MG tablet, Take 1,000 mg by mouth daily.    meloxicam (MOBIC) 15 MG tablet, Take 1 tablet (15 mg total) by mouth daily.   Rimegepant Sulfate (NURTEC) 75 MG TBDP, 1 po qod prn   rizatriptan (MAXALT) 10 MG tablet, Take 1 tablet (10 mg total) by mouth once as needed for up to 1 dose for migraine. May repeat in 2 hours if needed   traMADol (ULTRAM) 50 MG tablet, Take 1 tablet (50 mg total) by mouth every 6 (six) hours as needed for severe pain.   Current Outpatient Medications (Other):    betamethasone  dipropionate 0.05 % cream, Apply 1 application topically daily as needed (Eczema).    cephALEXin (KEFLEX) 500 MG capsule, Take 1 capsule (500 mg total) by mouth 4 (four) times daily. (Patient not taking: Reported on 08/15/2022)   clindamycin (CLINDAGEL) 1 % gel, Apply 1 application topically daily as needed (acne).    hydrocortisone (ANUSOL-HC) 2.5 % rectal cream, Use bid (Patient taking differently: Place 1 application  rectally 2 (two) times daily as needed for hemorrhoids.)   hydrOXYzine (ATARAX/VISTARIL) 10 MG tablet, Take 1-2 tablets after dinner for itch   LORazepam (ATIVAN) 1 MG tablet, TAKE 1 TO 2 TABLETS BY MOUTH AT BEDTIME AS NEEDED FOR SLEEP   Misc. Devices (ALL-BODY MASSAGE) MISC, Please provide patient with 15 minutes of gentle, comforting massage in the shoulder, neck, upper back region every evening  30 minutes prior to bedtime.  Repeat as often as needed.   phentermine 37.5 MG capsule, Take 1 capsule (37.5 mg total) by mouth every morning.   tiZANidine (ZANAFLEX) 4 MG tablet, Take 4 mg by mouth daily as needed (Back pain).    Vitamin D, Ergocalciferol, (DRISDOL) 1.25 MG (50000 UNIT) CAPS capsule, Take 50,000 Units by mouth every 14 (fourteen) days.   Reviewed prior external information including notes and imaging from  primary care provider As well as notes that were available from care everywhere and other healthcare systems.  Past medical history, social, surgical and family history all reviewed in electronic medical record.  No pertanent information unless stated regarding to the chief complaint.   Review of Systems:  No headache, visual changes, nausea, vomiting, diarrhea, constipation, dizziness, abdominal pain, skin rash, fevers, chills, night sweats, weight loss, swollen lymph nodes, body aches, joint swelling, chest pain, shortness of breath, mood changes. POSITIVE muscle aches  Objective  Blood pressure 122/80, pulse 90, height 5\' 6"  (1.676 m), weight 207 lb (93.9 kg),  SpO2 99 %.   General: No apparent distress alert and oriented x3 mood and affect normal, dressed appropriately.  HEENT: Pupils equal, extraocular movements intact  Respiratory: Patient's speak in full sentences and does not appear short of breath  Cardiovascular: No lower extremity edema, non tender, no erythema  Shoulder exam shows some mild impingement noted.  Some limited internal range of motion of the right shoulder compared to left.  Patient's rotator cuff strength is intact. Neck exam does show some mild limited sidebending right greater than left.  Limited muscular skeletal ultrasound was performed and interpreted by , M  Ultrasound shows rotator cuff he does have some degenerative changes of the supraspinatus.  He does have thickening of the anterior capsule noted likely consistent with frozen shoulder.  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 T3 extended rotated and side bent right inhaled third rib      Impression and Recommendations:    The above documentation has been reviewed and is accurate and complete Antoine Primas, DO

## 2022-09-10 ENCOUNTER — Ambulatory Visit: Payer: BC Managed Care – PPO | Admitting: Family Medicine

## 2022-09-10 ENCOUNTER — Ambulatory Visit: Payer: Self-pay

## 2022-09-10 VITALS — BP 122/80 | HR 90 | Ht 66.0 in | Wt 207.0 lb

## 2022-09-10 DIAGNOSIS — M9908 Segmental and somatic dysfunction of rib cage: Secondary | ICD-10-CM | POA: Diagnosis not present

## 2022-09-10 DIAGNOSIS — M9902 Segmental and somatic dysfunction of thoracic region: Secondary | ICD-10-CM | POA: Diagnosis not present

## 2022-09-10 DIAGNOSIS — M940 Chondrocostal junction syndrome [Tietze]: Secondary | ICD-10-CM

## 2022-09-10 DIAGNOSIS — M9901 Segmental and somatic dysfunction of cervical region: Secondary | ICD-10-CM | POA: Diagnosis not present

## 2022-09-10 DIAGNOSIS — M25511 Pain in right shoulder: Secondary | ICD-10-CM

## 2022-09-10 NOTE — Patient Instructions (Signed)
Do prescribed exercises at least 3x a week Move shoulder Zanaflex 2mg  at night Consider standing desk and vertical mouse See you again in 6-8 weeks

## 2022-09-10 NOTE — Assessment & Plan Note (Signed)
   Decision today to treat with OMT was based on Physical Exam  After verbal consent patient was treated with HVLA, ME, FPR techniques in cervical, thoracic, rib,areas, all areas are chronic   Patient tolerated the procedure well with improvement in symptoms  Patient given exercises, stretches and lifestyle modifications  See medications in patient instructions if given  Patient will follow up in 4-8 weeks 

## 2022-09-10 NOTE — Assessment & Plan Note (Signed)
Responded well to osteopathic manipulation.  Discussed scapular stability.  Patient does have signs and symptoms that is concerning for some possible frozen shoulder as well we will need to continue to monitor.  Follow-up again in 6 to 8 weeks

## 2022-09-13 ENCOUNTER — Other Ambulatory Visit: Payer: Self-pay | Admitting: Internal Medicine

## 2022-09-14 ENCOUNTER — Telehealth: Payer: Self-pay | Admitting: Internal Medicine

## 2022-09-14 NOTE — Telephone Encounter (Signed)
Patient has a bad migraine and has taken the medication given to her and is not sure what else to do. She would like a callback at [phone] to discuss.

## 2022-09-17 NOTE — Telephone Encounter (Signed)
Take Maxalt.  Use ibuprofen 600 mg 3 times daily as needed or Excedrin as needed.  Office visit with any provider if not better.

## 2022-09-18 NOTE — Telephone Encounter (Signed)
Called pt no answer LMOM RTC.../lmb 

## 2022-09-24 DIAGNOSIS — G4700 Insomnia, unspecified: Secondary | ICD-10-CM | POA: Diagnosis not present

## 2022-09-24 DIAGNOSIS — R102 Pelvic and perineal pain: Secondary | ICD-10-CM | POA: Diagnosis not present

## 2022-10-02 ENCOUNTER — Other Ambulatory Visit: Payer: Self-pay | Admitting: Internal Medicine

## 2022-10-02 DIAGNOSIS — D259 Leiomyoma of uterus, unspecified: Secondary | ICD-10-CM | POA: Diagnosis not present

## 2022-10-02 DIAGNOSIS — N9985 Post endometrial ablation syndrome: Secondary | ICD-10-CM | POA: Diagnosis not present

## 2022-10-02 DIAGNOSIS — R102 Pelvic and perineal pain: Secondary | ICD-10-CM | POA: Diagnosis not present

## 2022-10-02 DIAGNOSIS — N8003 Adenomyosis of the uterus: Secondary | ICD-10-CM | POA: Diagnosis not present

## 2022-10-09 DIAGNOSIS — L700 Acne vulgaris: Secondary | ICD-10-CM | POA: Diagnosis not present

## 2022-10-19 NOTE — Progress Notes (Unsigned)
[facility] [place] [address] [place] Phone: [phone] Subjective:   [name], am serving as a scribe for Dr. Hulan Saas.  I'm seeing this patient by the request  of:  Plotnikov, Evie Lacks, MD  CC: Back pain and neck pain follow-up  [id number]  Angel [name] is a 47 y.o. female coming in with complaint of back and neck pain. OMT 09/10/2022. Also f/u for R shoulder pain. Patient states R shoulder doing better. No new concerns. Here for manipulation  Medications patient has been prescribed: None  Taking:         Reviewed prior external information including notes and imaging from previsou exam, outside providers and external EMR if available.   As well as notes that were available from care everywhere and other healthcare systems.  Past medical history, social, surgical and family history all reviewed in electronic medical record.  No pertanent information unless stated regarding to the chief complaint.   Past Medical History:  Diagnosis Date   Atypical squamous cells of undetermined significance (ASC-US) on cervical Pap smear 10/30/2019   Bloating symptom 04/30/2012   9/13 2 hrs long cramping episodes associated w/diarrhea x 3 episodes R/o a viral illness, GS, pancreatitis and other dx's 9/18 gluten free diet   Breast lump 06/08/2013   Breast mass 06/2013   bilateral   Cardiac murmur 09/04/2019   Cyst of right ovary 10/30/2019   Degeneration of intervertebral disc of cervical spine without prolapsed disc 08/21/2017   DOE (dyspnea on exertion) 06/10/2019   GERD (gastroesophageal reflux disease) 08/2014   takes Protonix daily   Herniated disc    History of anemia 10/30/2019   History of dysmenorrhea 10/30/2019   History of hematuria 10/30/2019   Insomnia 10/07/2013   Chronic    Irritant dermatitis 11/17/2014   Migraine headache    Mittelschmerz 10/30/2019   Overweight 09/04/2019   Sickle cell trait ([place])    Vitamin D  deficiency 2009   Vitamin D deficiency 09/03/2007   Chronic 2/14 Risks associated with treatment noncompliance were discussed. Compliance was encouraged.      Allergies  Allergen Reactions   Other Dermatitis    Surgical glues Surgical glues   Butalbital-Acetaminophen     Did not like the effect   Trazodone And Nefazodone    Wound Dressing Adhesive     Other reaction(s): Rash:itching   Zolpidem     Ate in her sleep     Review of Systems:  No headache, visual changes, nausea, vomiting, diarrhea, constipation, dizziness, abdominal pain, skin rash, fevers, chills, night sweats, weight loss, swollen lymph nodes, body aches, joint swelling, chest pain, shortness of breath, mood changes. POSITIVE muscle aches  Objective  Blood pressure 114/70, pulse 87, height '5\' 6"'$  (1.676 m), weight 204 lb (92.5 kg), SpO2 97 %.   General: No apparent distress alert and oriented x3 mood and affect normal, dressed appropriately.  HEENT: Pupils equal, extraocular movements intact  Respiratory: Patient's speak in full sentences and does not appear short of breath  Cardiovascular: No lower extremity edema, non tender, no erythema  Right shoulder He does have some loss lordosis noted.  Patient does have tightness noted in the inferior aspect of the inferior angle of the scapula on the right side.  Patient does have significant tenderness to right upper quadrant pain as well and does have a positive Murphy sign noted.  Osteopathic findings  C2 flexed rotated and side bent right C6  flexed rotated and side bent left T3 extended rotated and side bent right inhaled rib T9 extended rotated and side bent right side with any improvement       Assessment and Plan:  Slipped rib syndrome Signs and symptoms were upper quadrant pain as well makes me concerned that there is some underlying gallbladder disease that could be contributing as well.  Discussed icing regimen and home exercises, discussed which  activities to do and which ones to avoid.  Patient will continue to work on posture and ergonomics.  Continue the vitamin D supplementation.  Patient does have sickle cell trait that does increase the risk of disease.  Follow-up again in 6 to 8 weeks  Abdominal pain, chronic, right upper quadrant Patient is having right upper quadrant pain, do feel palpation.  Ultrasound to further evaluate gallbladder if this is potentially radiating into the right shoulder blade contributing to some of patient's discomfort and pain as well.  Discussed icing regimen exercises, increase activity slowly.  Follow-up again in 6 to 8 weeks if gallbladder did not show pathology we will send him to general surgery.    Nonallopathic problems  Decision today to treat with OMT was based on Physical Exam  After verbal consent patient was treated with HVLA, ME, FPR techniques in cervical, rib, thoracic areas  Patient tolerated the procedure well with improvement in symptoms  Patient given exercises, stretches and lifestyle modifications  See medications in patient instructions if given  Patient will follow up in 4-8 weeks    The above documentation has been reviewed and is accurate and complete Lyndal Pulley, DO          Note: This dictation was prepared with Dragon dictation along with smaller phrase technology. Any transcriptional errors that result from this process are unintentional.

## 2022-10-22 ENCOUNTER — Ambulatory Visit: Payer: BC Managed Care – PPO | Admitting: Family Medicine

## 2022-10-22 ENCOUNTER — Ambulatory Visit: Payer: Self-pay

## 2022-10-22 VITALS — BP 114/70 | HR 87 | Ht 66.0 in | Wt 204.0 lb

## 2022-10-22 DIAGNOSIS — M9908 Segmental and somatic dysfunction of rib cage: Secondary | ICD-10-CM | POA: Diagnosis not present

## 2022-10-22 DIAGNOSIS — M25511 Pain in right shoulder: Secondary | ICD-10-CM | POA: Diagnosis not present

## 2022-10-22 DIAGNOSIS — M9901 Segmental and somatic dysfunction of cervical region: Secondary | ICD-10-CM | POA: Diagnosis not present

## 2022-10-22 DIAGNOSIS — M9902 Segmental and somatic dysfunction of thoracic region: Secondary | ICD-10-CM

## 2022-10-22 DIAGNOSIS — M940 Chondrocostal junction syndrome [Tietze]: Secondary | ICD-10-CM | POA: Diagnosis not present

## 2022-10-22 DIAGNOSIS — G8929 Other chronic pain: Secondary | ICD-10-CM | POA: Insufficient documentation

## 2022-10-22 DIAGNOSIS — R1011 Right upper quadrant pain: Secondary | ICD-10-CM

## 2022-10-22 NOTE — Patient Instructions (Addendum)
[facility] for Ultrasound  See you again in 2 months

## 2022-10-22 NOTE — Assessment & Plan Note (Signed)
Signs and symptoms were upper quadrant pain as well makes me concerned that there is some underlying gallbladder disease that could be contributing as well.  Discussed icing regimen and home exercises, discussed which activities to do and which ones to avoid.  Patient will continue to work on posture and ergonomics.  Continue the vitamin D supplementation.  Patient does have sickle cell trait that does increase the risk of disease.  Follow-up again in 6 to 8 weeks

## 2022-10-22 NOTE — Assessment & Plan Note (Signed)
Patient is having right upper quadrant pain, do feel palpation.  Ultrasound to further evaluate gallbladder if this is potentially radiating into the right shoulder blade contributing to some of patient's discomfort and pain as well.  Discussed icing regimen exercises, increase activity slowly.  Follow-up again in 6 to 8 weeks if gallbladder did not show pathology we will send him to general surgery.

## 2022-10-26 ENCOUNTER — Ambulatory Visit
Admission: RE | Admit: 2022-10-26 | Discharge: 2022-10-26 | Disposition: A | Discharge status: Home / Self Care | Payer: BC Managed Care – PPO | Source: Ambulatory Visit | Attending: Family Medicine | Admitting: Family Medicine

## 2022-10-26 DIAGNOSIS — R109 Unspecified abdominal pain: Secondary | ICD-10-CM | POA: Diagnosis not present

## 2022-10-26 DIAGNOSIS — R1011 Right upper quadrant pain: Secondary | ICD-10-CM

## 2022-11-02 DIAGNOSIS — D259 Leiomyoma of uterus, unspecified: Secondary | ICD-10-CM | POA: Diagnosis not present

## 2022-11-02 DIAGNOSIS — N8003 Adenomyosis of the uterus: Secondary | ICD-10-CM | POA: Diagnosis not present

## 2022-11-17 ENCOUNTER — Other Ambulatory Visit: Payer: Self-pay | Admitting: Internal Medicine

## 2022-11-21 ENCOUNTER — Encounter: Payer: Self-pay | Admitting: Internal Medicine

## 2022-11-21 ENCOUNTER — Ambulatory Visit: Payer: BC Managed Care – PPO | Admitting: Internal Medicine

## 2022-11-21 VITALS — BP 118/78 | HR 64 | Temp 98.6°F | Ht 66.0 in | Wt 204.0 lb

## 2022-11-21 DIAGNOSIS — G4700 Insomnia, unspecified: Secondary | ICD-10-CM

## 2022-11-21 DIAGNOSIS — E559 Vitamin D deficiency, unspecified: Secondary | ICD-10-CM | POA: Diagnosis not present

## 2022-11-21 DIAGNOSIS — Z0000 Encounter for general adult medical examination without abnormal findings: Secondary | ICD-10-CM

## 2022-11-21 DIAGNOSIS — E6609 Other obesity due to excess calories: Secondary | ICD-10-CM

## 2022-11-21 DIAGNOSIS — Z13220 Encounter for screening for lipoid disorders: Secondary | ICD-10-CM | POA: Diagnosis not present

## 2022-11-21 LAB — COMPREHENSIVE METABOLIC PANEL
ALT: 15 U/L (ref 0–35)
AST: 17 U/L (ref 0–37)
Albumin: 4.3 g/dL (ref 3.5–5.2)
Alkaline Phosphatase: 47 U/L (ref 39–117)
BUN: 14 mg/dL (ref 6–23)
CO2: 28 mEq/L (ref 19–32)
Calcium: 9.3 mg/dL (ref 8.4–10.5)
Chloride: 104 mEq/L (ref 96–112)
Creatinine, Ser: 0.85 mg/dL (ref 0.40–1.20)
GFR: 82.08 mL/min (ref >60.00)
Glucose, Bld: 81 mg/dL (ref 70–99)
Potassium: 3.8 mEq/L (ref 3.5–5.1)
Sodium: 138 mEq/L (ref 135–145)
Total Bilirubin: 0.5 mg/dL (ref 0.2–1.2)
Total Protein: 7.1 g/dL (ref 6.0–8.3)

## 2022-11-21 LAB — URINALYSIS
Bilirubin Urine: NEGATIVE
Hgb urine dipstick: NEGATIVE
Ketones, ur: NEGATIVE
Leukocytes,Ua: NEGATIVE
Nitrite: NEGATIVE
Specific Gravity, Urine: 1.01 (ref 1.000–1.030)
Total Protein, Urine: NEGATIVE
Urine Glucose: NEGATIVE
Urobilinogen, UA: 0.2 (ref 0.0–1.0)
pH: 6.5 (ref 5.0–8.0)

## 2022-11-21 LAB — CBC WITH DIFFERENTIAL/PLATELET
Basophils Absolute: 0 10*3/uL (ref 0.0–0.1)
Basophils Relative: 0.9 % (ref 0.0–3.0)
Eosinophils Absolute: 0.1 10*3/uL (ref 0.0–0.7)
Eosinophils Relative: 2.1 % (ref 0.0–5.0)
HCT: 39.3 % (ref 36.0–46.0)
Hemoglobin: 13.2 g/dL (ref 12.0–15.0)
Lymphocytes Relative: 39.5 % (ref 12.0–46.0)
Lymphs Abs: 1.4 10*3/uL (ref 0.7–4.0)
MCHC: 33.7 g/dL (ref 30.0–36.0)
MCV: 92.1 fl (ref 78.0–100.0)
Monocytes Absolute: 0.3 10*3/uL (ref 0.1–1.0)
Monocytes Relative: 9.2 % (ref 3.0–12.0)
Neutro Abs: 1.8 10*3/uL (ref 1.4–7.7)
Neutrophils Relative %: 48.3 % (ref 43.0–77.0)
Platelets: 269 10*3/uL (ref 150.0–400.0)
RBC: 4.26 Mil/uL (ref 3.87–5.11)
RDW: 13.7 % (ref 11.5–15.5)
WBC: 3.6 10*3/uL — ABNORMAL LOW (ref 4.0–10.5)

## 2022-11-21 LAB — LIPID PANEL
Cholesterol: 207 mg/dL — ABNORMAL HIGH (ref 0–200)
HDL: 77.6 mg/dL (ref >39.00)
LDL Cholesterol: 119 mg/dL — ABNORMAL HIGH (ref 0–99)
NonHDL: 129.3
Total CHOL/HDL Ratio: 3
Triglycerides: 50 mg/dL (ref 0.0–149.0)
VLDL: 10 mg/dL (ref 0.0–40.0)

## 2022-11-21 LAB — TSH: TSH: 2.22 u[IU]/mL (ref 0.35–5.50)

## 2022-11-21 LAB — VITAMIN D 25 HYDROXY (VIT D DEFICIENCY, FRACTURES): VITD: 30.86 ng/mL (ref 30.00–100.00)

## 2022-11-21 LAB — VITAMIN B12: Vitamin B-12: 317 pg/mL (ref 211–911)

## 2022-11-21 MED ORDER — NURTEC 75 MG PO TBDP: ORAL_TABLET | ORAL | 11 refills | Status: DC

## 2022-11-21 MED ORDER — ZALEPLON 5 MG PO CAPS: 5.0000 mg | ORAL_CAPSULE | Freq: Every evening | ORAL | 5 refills | Status: DC | PRN

## 2022-11-21 MED ORDER — VITAMIN D (ERGOCALCIFEROL) 50000 UNITS PO CAPS: ORAL_CAPSULE | ORAL | 3 refills | Status: DC

## 2022-11-21 NOTE — Assessment & Plan Note (Signed)
Lorazepam is not working well... - d/c Start Zaleplon

## 2022-11-21 NOTE — Progress Notes (Signed)
Subjective:  Patient ID: Angel [name], female    DOB: 1976/07/12  Age: 48 y.o. MRN: [id number]  CC: Follow-up (3 mnth f/u)   HPI Angel [name] presents for R shoulder pain - better F/u on HA 1-2/week - on Nurtec prn. Insomnia - Lorazepam is not working well... Well exam  Outpatient Medications Prior to Visit  Medication Sig Dispense Refill   acetaminophen (TYLENOL) 500 MG tablet Take 1,000 mg by mouth daily.      albuterol (VENTOLIN HFA) 108 (90 Base) MCG/ACT inhaler INHALE 2 PUFFS INTO THE LUNGS EVERY 6 HOURS AS NEEDED FOR WHEEZING OR SHORTNESS OF BREATH 6.7 g 3   betamethasone dipropionate 0.05 % cream Apply 1 application topically daily as needed (Eczema).      clindamycin (CLINDAGEL) 1 % gel Apply 1 application topically daily as needed (acne).   11   doxycycline (VIBRAMYCIN) 50 MG capsule SMARTSIG:1-2 Capsule(s) By Mouth Every Evening     hydrocortisone (ANUSOL-HC) 2.5 % rectal cream Use bid (Patient taking differently: Place 1 application  rectally 2 (two) times daily as needed for hemorrhoids.) 30 g 1   ipratropium (ATROVENT) 0.06 % nasal spray Place 2 sprays into both nostrils 2 (two) times daily.     LUPRON DEPOT, 53-MONTH, 11.25 MG injection Inject into the muscle.     Misc. Devices (ALL-BODY MASSAGE) MISC Please provide patient with 15 minutes of gentle, comforting massage in the shoulder, neck, upper back region every evening 30 minutes prior to bedtime.  Repeat as often as needed. 15 each 13   Multiple Vitamin (MULTI-VITAMIN) tablet Take 1 tablet by mouth daily.     spironolactone (ALDACTONE) 50 MG tablet Take 50 mg by mouth daily.      tiZANidine (ZANAFLEX) 4 MG tablet Take 4 mg by mouth daily as needed (Back pain).      hydrOXYzine (ATARAX/VISTARIL) 10 MG tablet Take 1-2 tablets after dinner for itch     LORazepam (ATIVAN) 1 MG tablet TAKE 1 TO 2 TABLETS BY MOUTH AT BEDTIME AS NEEDED FOR SLEEP 60 tablet 3   meloxicam (MOBIC) 15 MG tablet Take 1 tablet (15 mg total)  by mouth daily. 30 tablet 0   methylPREDNISolone (MEDROL DOSEPAK) 4 MG TBPK tablet As directed 21 tablet 0   phentermine 37.5 MG capsule Take 1 capsule (37.5 mg total) by mouth every morning. 30 capsule 2   Rimegepant Sulfate (NURTEC) 75 MG TBDP TAKE 1 TABLET BY MOUTH ONCE EVERY OTHER DAY AS NEEDED 12 tablet 3   traMADol (ULTRAM) 50 MG tablet Take 1 tablet (50 mg total) by mouth every 6 (six) hours as needed for severe pain. 20 tablet 0   Vitamin D, Ergocalciferol, 50000 units CAPS TAKE ONE CAPSULE BY MOUTH EVERY MONTH 3 capsule 0   promethazine (PHENERGAN) 25 MG tablet Take 1 tablet (25 mg total) by mouth every 4 (four) hours as needed for up to 7 days for nausea or vomiting. 30 tablet 3   benzonatate (TESSALON) 200 MG capsule Take by mouth.     cephALEXin (KEFLEX) 500 MG capsule Take 1 capsule (500 mg total) by mouth 4 (four) times daily. (Patient not taking: Reported on 08/15/2022) 20 capsule 1   promethazine-dextromethorphan (PROMETHAZINE-DM) 6.25-15 MG/5ML syrup Take 5 mLs by mouth 3 (three) times daily as needed.     rizatriptan (MAXALT) 10 MG tablet Take 1 tablet (10 mg total) by mouth once as needed for up to 1 dose for migraine. May repeat in 2 hours if  needed 12 tablet 12   No facility-administered medications prior to visit.    ROS: Review of Systems  Constitutional:  Positive for fatigue. Negative for activity change, appetite change, chills and unexpected weight change.  HENT:  Negative for congestion, mouth sores and sinus pressure.   Eyes:  Negative for visual disturbance.  Respiratory:  Negative for cough and chest tightness.   Gastrointestinal:  Negative for abdominal pain and nausea.  Genitourinary:  Negative for difficulty urinating, frequency and vaginal pain.  Musculoskeletal:  Negative for back pain and gait problem.  Skin:  Negative for pallor and rash.  Neurological:  Positive for headaches. Negative for dizziness, tremors, weakness and numbness.   Psychiatric/Behavioral:  Positive for sleep disturbance. Negative for confusion.     Objective:  BP 118/78 (BP Location: Right Arm, Patient Position: Sitting, Cuff Size: Large)   Pulse 64   Temp 98.6 F (37 C) (Oral)   Ht 5\' 6"  (1.676 m)   Wt 204 lb (92.5 kg)   SpO2 98%   BMI 32.93 kg/m   BP Readings from Last 3 Encounters:  11/21/22 118/78  10/22/22 114/70  09/10/22 122/80    Wt Readings from Last 3 Encounters:  11/21/22 204 lb (92.5 kg)  10/22/22 204 lb (92.5 kg)  09/10/22 207 lb (93.9 kg)    Physical Exam Constitutional:      General: She is not in acute distress.    Appearance: She is well-developed. She is obese.  HENT:     Head: Normocephalic.     Right Ear: External ear normal.     Left Ear: External ear normal.     Nose: Nose normal.  Eyes:     General:        Right eye: No discharge.        Left eye: No discharge.     Conjunctiva/sclera: Conjunctivae normal.     Pupils: Pupils are equal, round, and reactive to light.  Neck:     Thyroid: No thyromegaly.     Vascular: No JVD.     Trachea: No tracheal deviation.  Cardiovascular:     Rate and Rhythm: Normal rate and regular rhythm.     Heart sounds: Normal heart sounds.  Pulmonary:     Effort: No respiratory distress.     Breath sounds: No stridor. No wheezing.  Abdominal:     General: Bowel sounds are normal. There is no distension.     Palpations: Abdomen is soft. There is no mass.     Tenderness: There is no abdominal tenderness. There is no guarding or rebound.  Musculoskeletal:        General: No tenderness.     Cervical back: Normal range of motion and neck supple. No rigidity.  Lymphadenopathy:     Cervical: No cervical adenopathy.  Skin:    Findings: No erythema or rash.  Neurological:     Cranial Nerves: No cranial nerve deficit.     Motor: No abnormal muscle tone.     Coordination: Coordination normal.     Deep Tendon Reflexes: Reflexes normal.  Psychiatric:        Behavior:  Behavior normal.        Thought Content: Thought content normal.        Judgment: Judgment normal.     Lab Results  Component Value Date   WBC 5.4 11/14/2021   HGB 12.7 11/14/2021   HCT 37.7 11/14/2021   PLT 254.0 11/14/2021   GLUCOSE 93 11/14/2021  CHOL 193 11/14/2021   TRIG 50.0 11/14/2021   HDL 75.40 11/14/2021   LDLCALC 108 (H) 11/14/2021   ALT 12 11/14/2021   AST 15 11/14/2021   NA 138 11/14/2021   K 4.1 11/14/2021   CL 104 11/14/2021   CREATININE 0.86 11/14/2021   BUN 12 11/14/2021   CO2 26 11/14/2021   TSH 1.70 11/14/2021   HGBA1C 5.6 10/18/2014    US Abdomen Limited RUQ (LIVER/GB)  Result Date: 10/26/2022 CLINICAL DATA:  Right upper quadrant abdomen pain for 3 weeks. EXAM: ULTRASOUND ABDOMEN LIMITED RIGHT UPPER QUADRANT COMPARISON:  None Available. FINDINGS: Gallbladder: No gallstones or wall thickening visualized. No sonographic Murphy sign noted by sonographer. Common bile duct: Diameter: 2.9 mm Liver: No focal lesion identified. Within normal limits in parenchymal echogenicity. Portal vein is patent on color Doppler imaging with normal direction of blood flow towards the liver. Other: None. IMPRESSION: Normal right upper quadrant ultrasound. Electronically Signed   By: Sherian Rein M.D.   On: 10/26/2022 09:06    Assessment & Plan:   Problem List Items Addressed This Visit       Other   Insomnia    Lorazepam is not working well... - d/c Start Zaleplon      Relevant Orders   Vitamin B12   Obesity    On diet      Vitamin D deficiency   Relevant Orders   VITAMIN D 25 Hydroxy (Vit-D Deficiency, Fractures)   Well adult exam - Primary     We discussed age appropriate health related issues, including available/recomended screening tests and vaccinations. Labs were ordered to be later reviewed . All questions were answered. We discussed one or more of the following - seat belt use, use of sunscreen/sun exposure exercise, safe sex, fall risk reduction,  second hand smoke exposure, firearm use and storage, seat belt use, a need for adhering to healthy diet and exercise. Labs were ordered.  All questions were answered.      Relevant Orders   TSH   Urinalysis   CBC with Differential/Platelet   Lipid panel   Comprehensive metabolic panel   VITAMIN D 25 Hydroxy (Vit-D Deficiency, Fractures)   Vitamin B12      Meds ordered this encounter  Medications   Rimegepant Sulfate (NURTEC) 75 MG TBDP    Sig: TAKE 1 TABLET BY MOUTH ONCE EVERY OTHER DAY AS NEEDED    Dispense:  8 tablet    Refill:  11   Vitamin D, Ergocalciferol, 50000 units CAPS    Sig: TAKE ONE CAPSULE BY MOUTH EVERY MONTH    Dispense:  3 capsule    Refill:  3   zaleplon (SONATA) 5 MG capsule    Sig: Take 1-2 capsules (5-10 mg total) by mouth at bedtime as needed for sleep.    Dispense:  60 capsule    Refill:  5      Follow-up: Return in about 6 months (around 05/23/2023) for a follow-up visit.  Sonda Primes, MD

## 2022-11-21 NOTE — Assessment & Plan Note (Signed)
  On diet  

## 2022-11-21 NOTE — Assessment & Plan Note (Signed)

## 2022-12-06 NOTE — Progress Notes (Signed)
[facility] [address] [place] [address] Phone: [phone] Subjective:   INadine Counts, am serving as a scribe for Dr. Antoine Primas.  I'm seeing this patient by the request  of:  Plotnikov, Georgina Quint, MD  CC: Back and neck pain follow-up  [id number]  Angel [name] is a 47 y.o. female coming in with complaint of back and neck pain. Also f/u for R shoulder pain. Patient states shoulder is doing much better. No new concerns.  Medications patient has been prescribed: None  Taking:         Reviewed prior external information including notes and imaging from previsou exam, outside providers and external EMR if available.   As well as notes that were available from care everywhere and other healthcare systems.  Past medical history, social, surgical and family history all reviewed in electronic medical record.  No pertanent information unless stated regarding to the chief complaint.   Past Medical History:  Diagnosis Date   Atypical squamous cells of undetermined significance (ASC-US) on cervical Pap smear 10/30/2019   Bloating symptom 04/30/2012   9/13 2 hrs long cramping episodes associated w/diarrhea x 3 episodes R/o a viral illness, GS, pancreatitis and other dx's 9/18 gluten free diet   Breast lump 06/08/2013   Breast mass 06/2013   bilateral   Cardiac murmur 09/04/2019   Cyst of right ovary 10/30/2019   Degeneration of intervertebral disc of cervical spine without prolapsed disc 08/21/2017   DOE (dyspnea on exertion) 06/10/2019   GERD (gastroesophageal reflux disease) 08/2014   takes Protonix daily   Herniated disc    History of anemia 10/30/2019   History of dysmenorrhea 10/30/2019   History of hematuria 10/30/2019   Insomnia 10/07/2013   Chronic    Irritant dermatitis 11/17/2014   Migraine headache    Mittelschmerz 10/30/2019   Nausea and vomiting 12/07/2022   Overweight 09/04/2019   Sickle cell trait ([facility])    Vitamin D  deficiency 2009   Vitamin D deficiency 09/03/2007   Chronic 2/14 Risks associated with treatment noncompliance were discussed. Compliance was encouraged.      Allergies  Allergen Reactions   Other Dermatitis    Surgical glues Surgical glues   Butalbital-Acetaminophen     Did not like the effect   Trazodone And Nefazodone    Wound Dressing Adhesive     Other reaction(s): Rash:itching   Zolpidem     Ate in her sleep     Review of Systems:  No headache, visual changes, nausea, vomiting, diarrhea, constipation, dizziness, abdominal pain, skin rash, fevers, chills, night sweats, weight loss, swollen lymph nodes, body aches, joint swelling, chest pain, shortness of breath, mood changes. POSITIVE muscle aches  Objective  Blood pressure 110/68, pulse 90, height 5\' 6"  (1.676 m), weight 197 lb (89.4 kg), SpO2 98 %.   General: No apparent distress alert and oriented x3 mood and affect normal, dressed appropriately.  HEENT: Pupils equal, extraocular movements intact  Respiratory: Patient's speak in full sentences and does not appear short of breath  Cardiovascular: No lower extremity edema, non tender, no erythema  Back exam does have some loss lordosis noted.  Tenderness to palpation in the sacroiliac joint bilaterally right greater than left.  Patient does have a negative straight leg test noted.  Osteopathic findings  C3 flexed rotated and side bent right C6 flexed rotated and side bent left T3 extended rotated and side bent right inhaled rib T8 extended rotated and  side bent left L3 flexed rotated and side bent right Sacrum right on right       Assessment and Plan:  Slipped rib syndrome Seems to be more of the difficulty she is having at this time.  Has had sacroiliitis as well.  Is responding extremely well though to osteopathic manipulation.  Will continue to monitor the facet joint pain patient has had in the past.  No significant changes in medication.  Follow-up again in 6  to 12 weeks  Chronic right shoulder pain Improvement at this time.  No other changes in management.    Nonallopathic problems  Decision today to treat with OMT was based on Physical Exam  After verbal consent patient was treated with HVLA, ME, FPR techniques in cervical, rib, thoracic, lumbar, and sacral  areas  Patient tolerated the procedure well with improvement in symptoms  Patient given exercises, stretches and lifestyle modifications  See medications in patient instructions if given  Patient will follow up in 4-8 weeks     The above documentation has been reviewed and is accurate and complete Judi Saa, DO         Note: This dictation was prepared with Dragon dictation along with smaller phrase technology. Any transcriptional errors that result from this process are unintentional.

## 2022-12-07 ENCOUNTER — Ambulatory Visit: Payer: BC Managed Care – PPO | Admitting: Nurse Practitioner

## 2022-12-07 ENCOUNTER — Telehealth: Payer: Self-pay | Admitting: Internal Medicine

## 2022-12-07 ENCOUNTER — Encounter: Payer: Self-pay | Admitting: Nurse Practitioner

## 2022-12-07 ENCOUNTER — Ambulatory Visit (INDEPENDENT_AMBULATORY_CARE_PROVIDER_SITE_OTHER): Payer: BC Managed Care – PPO

## 2022-12-07 VITALS — BP 94/66 | HR 81 | Temp 98.2°F | Ht 66.0 in | Wt 196.2 lb

## 2022-12-07 DIAGNOSIS — R112 Nausea with vomiting, unspecified: Secondary | ICD-10-CM

## 2022-12-07 HISTORY — DX: Nausea with vomiting, unspecified: R11.2

## 2022-12-07 LAB — CBC WITH DIFFERENTIAL/PLATELET
Basophils Absolute: 0 10*3/uL (ref 0.0–0.1)
Basophils Relative: 0.9 % (ref 0.0–3.0)
Eosinophils Absolute: 0.1 10*3/uL (ref 0.0–0.7)
Eosinophils Relative: 1.5 % (ref 0.0–5.0)
HCT: 42.9 % (ref 36.0–46.0)
Hemoglobin: 14.4 g/dL (ref 12.0–15.0)
Lymphocytes Relative: 29.7 % (ref 12.0–46.0)
Lymphs Abs: 1.4 10*3/uL (ref 0.7–4.0)
MCHC: 33.6 g/dL (ref 30.0–36.0)
MCV: 89.4 fl (ref 78.0–100.0)
Monocytes Absolute: 0.4 10*3/uL (ref 0.1–1.0)
Monocytes Relative: 9 % (ref 3.0–12.0)
Neutro Abs: 2.8 10*3/uL (ref 1.4–7.7)
Neutrophils Relative %: 58.9 % (ref 43.0–77.0)
Platelets: 293 10*3/uL (ref 150.0–400.0)
RBC: 4.8 Mil/uL (ref 3.87–5.11)
RDW: 13 % (ref 11.5–15.5)
WBC: 4.8 10*3/uL (ref 4.0–10.5)

## 2022-12-07 LAB — BASIC METABOLIC PANEL
BUN: 14 mg/dL (ref 6–23)
CO2: 27 mEq/L (ref 19–32)
Calcium: 9.5 mg/dL (ref 8.4–10.5)
Chloride: 101 mEq/L (ref 96–112)
Creatinine, Ser: 0.92 mg/dL (ref 0.40–1.20)
GFR: 74.62 mL/min (ref >60.00)
Glucose, Bld: 80 mg/dL (ref 70–99)
Potassium: 3.8 mEq/L (ref 3.5–5.1)
Sodium: 138 mEq/L (ref 135–145)

## 2022-12-07 MED ORDER — ONDANSETRON HCL 4 MG PO TABS
4.0000 mg | ORAL_TABLET | Freq: Three times a day (TID) | ORAL | 0 refills | Status: AC | PRN
Start: 2022-12-07 — End: ?

## 2022-12-07 NOTE — Telephone Encounter (Signed)
Pt called stating that she ate a bad sandwich and she been  vomiting since Sunday wanted to know what she can take or prescribe.

## 2022-12-07 NOTE — Progress Notes (Signed)
Established Patient Office Visit  Subjective   Patient ID: Angel [name], female    DOB: 06-01-1976  Age: 47 y.o. MRN: [id number]  Chief Complaint  Patient presents with   Emesis    Symptom onset 4 days ago.  Reports eating a fish sandwich from a subshot the day prior to symptom onset.  She has been experiencing intermittent nausea with vomiting, loss of appetite, malaise.  Reports last night started feeling better so she tried eating some chicken nuggets, was able to keep this down but woke up this morning feeling nauseated and did vomit again today.  Reports her vomitus has been bile. Has taken phenergan once a day as needed intermittently with temporary relief in symptoms. Has been able to keep down fluids today. LBM: 4 days ago, no abdominal pain.     Review of Systems  Constitutional:  Negative for malaise/fatigue.  Respiratory:  Negative for cough.   Cardiovascular:  Negative for chest pain.  Gastrointestinal:  Negative for abdominal pain.  Neurological:  Negative for dizziness.      Objective:     BP 94/66   Pulse 81   Temp 98.2 F (36.8 C) (Temporal)   Ht 5\' 6"  (1.676 m)   Wt 196 lb 4 oz (89 kg)   SpO2 95%   BMI 31.68 kg/m  BP Readings from Last 3 Encounters:  12/07/22 94/66  11/21/22 118/78  10/22/22 114/70   Wt Readings from Last 3 Encounters:  12/07/22 196 lb 4 oz (89 kg)  11/21/22 204 lb (92.5 kg)  10/22/22 204 lb (92.5 kg)      Physical Exam Vitals reviewed.  Constitutional:      General: She is not in acute distress.    Appearance: Normal appearance.  HENT:     Head: Normocephalic and atraumatic.  Neck:     Vascular: No carotid bruit.  Cardiovascular:     Rate and Rhythm: Normal rate and regular rhythm.     Pulses: Normal pulses.     Heart sounds: Normal heart sounds.  Pulmonary:     Effort: Pulmonary effort is normal.     Breath sounds: Normal breath sounds.  Abdominal:     General: Abdomen is flat. Bowel sounds are increased. There  is no distension.     Palpations: Abdomen is soft.     Tenderness: There is no abdominal tenderness. There is no guarding or rebound.  Skin:    General: Skin is warm and dry.  Neurological:     General: No focal deficit present.     Mental Status: She is alert and oriented to person, place, and time.  Psychiatric:        Mood and Affect: Mood normal.        Behavior: Behavior normal.        Judgment: Judgment normal.      No results found for any visits on 12/07/22.    The 10-year ASCVD risk score (Arnett DK, et al., 2019) is: 0.2%    Assessment & Plan:   Problem List Items Addressed This Visit       Digestive   Nausea and vomiting - Primary    Acute, VSS, afebrile, BP slightly softer than normal but she denies lightheadedness/dizziness, does report general malaise Offered POC covid test, but she declined Started to feel better until she ate chicken nuggets and then she started feeling a bit ill again this morning No acute pain, no fever, no BM in 4 days but  without pain and good bowel sounds. Possible this could be due to lack of food intake. Has history of diverticula, this seems more consistent with viral gastroenteritis vs. Food poisoning as opposed to diverticulitis as patient is without pain Check CBC, CMP, abdominal Xray Supportive treatment with focus on fluid intake, use of the Phenergan or Zofran as needed for nausea, eat bland foods If symptoms worsen or do not improve patient told to follow-up early next week for additional in person evaluation.      Relevant Medications   ondansetron (ZOFRAN) 4 MG tablet   Other Relevant Orders   Basic metabolic panel   CBC with Differential/Platelet   DG Abd 2 Views    Return if symptoms worsen or fail to improve.    Elenore Paddy, NP

## 2022-12-07 NOTE — Telephone Encounter (Signed)
Called pt will need OV made apt w/ Jiles Prows @ 3:20.Marland KitchenRaechel Chute

## 2022-12-07 NOTE — Assessment & Plan Note (Signed)
Acute, VSS, afebrile, BP slightly softer than normal but she denies lightheadedness/dizziness, does report general malaise Offered POC covid test, but she declined Started to feel better until she ate chicken nuggets and then she started feeling a bit ill again this morning No acute pain, no fever, no BM in 4 days but without pain and good bowel sounds. Possible this could be due to lack of food intake. Has history of diverticula, this seems more consistent with viral gastroenteritis vs. Food poisoning as opposed to diverticulitis as patient is without pain Check CBC, CMP, abdominal Xray Supportive treatment with focus on fluid intake, use of the Phenergan or Zofran as needed for nausea, eat bland foods If symptoms worsen or do not improve patient told to follow-up early next week for additional in person evaluation.

## 2022-12-12 ENCOUNTER — Ambulatory Visit: Payer: BC Managed Care – PPO | Admitting: Family Medicine

## 2022-12-12 ENCOUNTER — Encounter: Payer: Self-pay | Admitting: Family Medicine

## 2022-12-12 VITALS — BP 110/68 | HR 90 | Ht 66.0 in | Wt 197.0 lb

## 2022-12-12 DIAGNOSIS — M9903 Segmental and somatic dysfunction of lumbar region: Secondary | ICD-10-CM | POA: Diagnosis not present

## 2022-12-12 DIAGNOSIS — M9908 Segmental and somatic dysfunction of rib cage: Secondary | ICD-10-CM

## 2022-12-12 DIAGNOSIS — M940 Chondrocostal junction syndrome [Tietze]: Secondary | ICD-10-CM

## 2022-12-12 DIAGNOSIS — G8929 Other chronic pain: Secondary | ICD-10-CM

## 2022-12-12 DIAGNOSIS — M25511 Pain in right shoulder: Secondary | ICD-10-CM | POA: Diagnosis not present

## 2022-12-12 DIAGNOSIS — M9904 Segmental and somatic dysfunction of sacral region: Secondary | ICD-10-CM

## 2022-12-12 DIAGNOSIS — M9901 Segmental and somatic dysfunction of cervical region: Secondary | ICD-10-CM

## 2022-12-12 DIAGNOSIS — M9902 Segmental and somatic dysfunction of thoracic region: Secondary | ICD-10-CM

## 2022-12-12 NOTE — Assessment & Plan Note (Signed)
Improvement at this time.  No other changes in management.

## 2022-12-12 NOTE — Assessment & Plan Note (Signed)
Seems to be more of the difficulty she is having at this time.  Has had sacroiliitis as well.  Is responding extremely well though to osteopathic manipulation.  Will continue to monitor the facet joint pain patient has had in the past.  No significant changes in medication.  Follow-up again in 6 to 12 weeks

## 2022-12-12 NOTE — Patient Instructions (Signed)
We will have you tuned up prior to reunion See me in 6 weeks

## 2023-01-23 DIAGNOSIS — H40023 Open angle with borderline findings, high risk, bilateral: Secondary | ICD-10-CM | POA: Diagnosis not present

## 2023-01-23 DIAGNOSIS — H2513 Age-related nuclear cataract, bilateral: Secondary | ICD-10-CM | POA: Diagnosis not present

## 2023-01-23 DIAGNOSIS — H524 Presbyopia: Secondary | ICD-10-CM | POA: Diagnosis not present

## 2023-01-29 ENCOUNTER — Ambulatory Visit: Payer: BC Managed Care – PPO | Admitting: Family Medicine

## 2023-02-04 ENCOUNTER — Other Ambulatory Visit: Payer: Self-pay

## 2023-02-04 ENCOUNTER — Ambulatory Visit: Payer: BC Managed Care – PPO | Admitting: Family Medicine

## 2023-02-04 ENCOUNTER — Ambulatory Visit (INDEPENDENT_AMBULATORY_CARE_PROVIDER_SITE_OTHER): Payer: BC Managed Care – PPO

## 2023-02-04 ENCOUNTER — Encounter: Payer: Self-pay | Admitting: Family Medicine

## 2023-02-04 VITALS — BP 104/70 | HR 79 | Ht 66.0 in | Wt 198.0 lb

## 2023-02-04 DIAGNOSIS — M25511 Pain in right shoulder: Secondary | ICD-10-CM

## 2023-02-04 DIAGNOSIS — M19011 Primary osteoarthritis, right shoulder: Secondary | ICD-10-CM | POA: Diagnosis not present

## 2023-02-04 DIAGNOSIS — G8929 Other chronic pain: Secondary | ICD-10-CM

## 2023-02-04 MED ORDER — TRAMADOL HCL 50 MG PO TABS: 50.0000 mg | ORAL_TABLET | Freq: Three times a day (TID) | ORAL | 0 refills | Status: DC | PRN

## 2023-02-04 NOTE — Patient Instructions (Addendum)
Thank you for coming in today.  Please get an Xray today before you leave  I've referred you to Physical Therapy.  Let us know if you don't hear from them in one week.   Call or go to the ER if you develop a large red swollen joint with extreme pain or oozing puss.    Ok to get a massage.   Heating and TENS unit.   TENS UNIT: This is helpful for muscle pain and spasm.   Search and Purchase a TENS 7000 2nd edition at  www.tenspros.com or www.Amazon.com It should be less than $30.     TENS unit instructions: Do not shower or bathe with the unit on Turn the unit off before removing electrodes or batteries If the electrodes lose stickiness add a drop of water to the electrodes after they are disconnected from the unit and place on plastic sheet. If you continued to have difficulty, call the TENS unit company to purchase more electrodes. Do not apply lotion on the skin area prior to use. Make sure the skin is clean and dry as this will help prolong the life of the electrodes. After use, always check skin for unusual red areas, rash or other skin difficulties. If there are any skin problems, does not apply electrodes to the same area. Never remove the electrodes from the unit by pulling the wires. Do not use the TENS unit or electrodes other than as directed. Do not change electrode placement without consultating your therapist or physician. Keep 2 fingers with between each electrode. Wear time ratio is 2:1, on to off times.    For example on for 30 minutes off for 15 minutes and then on for 30 minutes off for 15 minutes

## 2023-02-04 NOTE — Progress Notes (Unsigned)
I, Stevenson Clinch, CMA acting as a scribe for Clementeen Graham, MD.  Angel [name] is a 47 y.o. female who presents to [facility] today for worsening R shoulder pain. Pt was previously seen by Dr. Katrinka Blazing on 12/12/22 for OMT.   Today, pt reports her R shoulder pain has worsened over the past 2 weeks. Pt locates pain to anterior aspect, radiating into the arm and causing n/t in the hand. Planning on going out of town on 2023-02-15. Father passed away 2 weeks ago. Notes sleeping on air mattress  a couple of time over the past few weeks. Some swelling present around the clavicle. Also notes periscapular sx. Does HEP intermittently.   Neck pain: no Radiates: n/t in the hands Aggravates: sleeping reaching across the body. Lifting weighted objects. Treatments tried: OMT, HEP, Voltaren, Icy Hot  Dx imaging: 10/27/20 R shoulder XR  Pertinent review of systems: No fevers or chills  Relevant historical information: Sacroiliitis Social history significant for the recent death of her father and grandfather.  Her father was chronically ill so was not a surprise.  She spent the last 2 weeks under a fair amount of stress trying to organize everything.  She is going on a much-needed family vacation later this week.  Exam:  BP 104/70   Pulse 79   Ht 5\' 6"  (1.676 m)   Wt 198 lb (89.8 kg)   SpO2 97%   BMI 31.96 kg/m  General: Well Developed, well nourished, and in no acute distress.   MSK: C-spine: Normal appearing Nontender palpation spinal midline. Tender palpation right trapezius and periscapular area. Upper extremity strength is intact except noted below.  Right shoulder: Normal-appearing Tender palpation right trapezius. Normal shoulder motion pain with abduction. Strength 4/5 to abduction.  5/5 external/internal rotation. Positive Hawkins and Neer's test.  Positive empty can test. Negative Yergason's and speeds test.    Lab and Radiology Results  Procedure:  Real-time Ultrasound Guided Injection of right shoulder subacromial bursa Device: Philips Affiniti 50G Images permanently stored and available for review in PACS Verbal informed consent obtained.  Discussed risks and benefits of procedure. Warned about infection, bleeding, hyperglycemia damage to structures among others. Patient expresses understanding and agreement Time-out conducted.   Noted no overlying erythema, induration, or other signs of local infection.   Skin prepped in a sterile fashion.   Local anesthesia: Topical Ethyl chloride.   With sterile technique and under real time ultrasound guidance: 40 mg of Kenalog and 2 mL of Marcaine injected into subacromial bursa. Fluid seen entering the bursa.   Completed without difficulty   Pain immediately resolved suggesting accurate placement of the medication.   Advised to call if fevers/chills, erythema, induration, drainage, or persistent bleeding.   Images permanently stored and available for review in the ultrasound unit.  Impression: Technically successful ultrasound guided injection.    X-ray images right shoulder obtained today personally and independently interpreted Mild glenohumeral DJD.  Moderate AC DJD.  No acute fractures are visible. Await formal radiology review    Assessment and Plan: 47 y.o. female with right shoulder and trapezius pain.  I think she does have some subacromial bursitis to explain a fair amount of her shoulder pain.  However she also has a lot of periscapular dysfunction and pain.  She is leaving for vacation later this week so time is limited.  Proceed with a steroid injection today recommend heating pad and TENS unit and tramadol if needed.  I ordered  physical therapy with the expectation that she will start when she gets back in town.  Check back as needed.   PDMP reviewed during this encounter. Orders Placed This Encounter  Procedures   [place] LIMITED JOINT SPACE STRUCTURES UP RIGHT(NO LINKED CHARGES)     Order Specific Question:   Reason for Exam (SYMPTOM  OR DIAGNOSIS REQUIRED)    Answer:   right shoulder pain    Order Specific Question:   Preferred imaging location?    Answer:   [facility] Shoulder Right    Standing Status:   Future    Number of Occurrences:   1    Standing Expiration Date:   02/04/2024    Order Specific Question:   Reason for Exam (SYMPTOM  OR DIAGNOSIS REQUIRED)    Answer:   right shoulder pain    Order Specific Question:   Preferred imaging location?    Answer:   [facility]    Order Specific Question:   Is patient pregnant?    Answer:   No   Ambulatory referral to Physical Therapy    Referral Priority:   Routine    Referral Type:   Physical Medicine    Referral Reason:   Specialty Services Required    Requested Specialty:   Physical Therapy    Number of Visits Requested:   1   Meds ordered this encounter  Medications   traMADol (ULTRAM) 50 MG tablet    Sig: Take 1 tablet (50 mg total) by mouth every 8 (eight) hours as needed for severe pain.    Dispense:  15 tablet    Refill:  0     Discussed warning signs or symptoms. Please see discharge instructions. Patient expresses understanding.   The above documentation has been reviewed and is accurate and complete Clementeen Graham, M.D.

## 2023-02-11 NOTE — Progress Notes (Signed)
Right shoulder x-ray shows mild arthritis

## 2023-02-12 ENCOUNTER — Telehealth: Payer: Self-pay | Admitting: *Deleted

## 2023-02-12 NOTE — Telephone Encounter (Signed)
Rec'd fax pt needing PA on Nurtec. Submitted w/ (Key: [id number]). PA sent to [facility]...Raechel Chute

## 2023-02-13 ENCOUNTER — Telehealth: Payer: Self-pay

## 2023-02-13 NOTE — Telephone Encounter (Signed)
*  Primary  PA request received for Nurtec 75MG  dispersible tablets  PA submitted to [employer] via CMM and is pending additional questions/determination  Key: [id number]

## 2023-02-13 NOTE — Telephone Encounter (Signed)
Check status on PA still pending../lmb 

## 2023-02-18 NOTE — Telephone Encounter (Signed)
Chek status on PA med was approved.Marland KitchenRaechel Chute

## 2023-02-22 ENCOUNTER — Ambulatory Visit (INDEPENDENT_AMBULATORY_CARE_PROVIDER_SITE_OTHER): Payer: BC Managed Care – PPO | Admitting: Physical Therapy

## 2023-02-22 DIAGNOSIS — M542 Cervicalgia: Secondary | ICD-10-CM | POA: Diagnosis not present

## 2023-02-22 DIAGNOSIS — M25511 Pain in right shoulder: Secondary | ICD-10-CM

## 2023-02-22 DIAGNOSIS — M62838 Other muscle spasm: Secondary | ICD-10-CM

## 2023-02-22 NOTE — Therapy (Addendum)
OUTPATIENT PHYSICAL THERAPY UPPER EXTREMITY EVALUATION   Patient Name: Angel [name] MRN: [id number] DOB:1976-08-07, 47 y.o., female Today's Date: 02/22/2023  END OF SESSION:  PT End of Session - 02/24/23 1934     Visit Number 1    Number of Visits 16    Date for PT Re-Evaluation 04/21/23    Authorization Type BCBS    PT Start Time 1237    PT Stop Time 1315    PT Time Calculation (min) 38 min    Activity Tolerance Patient tolerated treatment well    Behavior During Therapy [facility] for tasks assessed/performed             Past Medical History:  Diagnosis Date   Atypical squamous cells of undetermined significance (ASC-US) on cervical Pap smear 10/30/2019   Bloating symptom 04/30/2012   9/13 2 hrs long cramping episodes associated w/diarrhea x 3 episodes R/o a viral illness, GS, pancreatitis and other dx's 9/18 gluten free diet   Breast lump 06/08/2013   Breast mass 06/2013   bilateral   Cardiac murmur 09/04/2019   Cyst of right ovary 10/30/2019   Degeneration of intervertebral disc of cervical spine without prolapsed disc 08/21/2017   DOE (dyspnea on exertion) 06/10/2019   GERD (gastroesophageal reflux disease) 08/2014   takes Protonix daily   Herniated disc    History of anemia 10/30/2019   History of dysmenorrhea 10/30/2019   History of hematuria 10/30/2019   Insomnia 10/07/2013   Chronic    Irritant dermatitis 11/17/2014   Migraine headache    Mittelschmerz 10/30/2019   Nausea and vomiting 12/07/2022   Overweight 09/04/2019   Sickle cell trait (HCC)    Vitamin D deficiency 2009   Vitamin D deficiency 09/03/2007   Chronic 2/14 Risks associated with treatment noncompliance were discussed. Compliance was encouraged.     Past Surgical History:  Procedure Laterality Date   BILATERAL SALPINGECTOMY  09/26/2015   Procedure: BILATERAL SALPINGECTOMY;  Surgeon: Osborn Coho, MD;  Location: [facility];  Service: Gynecology;;   BREAST BIOPSY Bilateral 07/13/2013   Procedure: BILATERAL  EXCISION BREAS MASSES;  Surgeon: Shelly Rubenstein, MD;  Location: [facility];  Service: General;  Laterality: Bilateral;   BREAST CYST EXCISION Left 2014   4 cysts removed   DE QUERVAIN'S RELEASE Right 09/06/2003   DILITATION & CURRETTAGE/HYSTROSCOPY WITH NOVASURE ABLATION N/A 09/26/2015   Procedure: DILATATION & CURETTAGE/HYSTEROSCOPY WITH NOVASURE ABLATION;  Surgeon: Osborn Coho, MD;  Location: [facility];  Service: Gynecology;  Laterality: N/A;   LAPAROSCOPIC OVARIAN CYSTECTOMY Right 09/26/2015   Procedure: LAPAROSCOPIC Right OVARIAN CYSTECTOMY, Bilateral Peritoneal Biopsies over Uteralsacral area;  Surgeon: Osborn Coho, MD;  Location: [facility];  Service: Gynecology;  Laterality: Right;   LEFT HEART CATH AND CORONARY ANGIOGRAPHY N/A 12/30/2019   Procedure: LEFT HEART CATH AND CORONARY ANGIOGRAPHY;  Surgeon: Marykay Lex, MD;  Location: [facility];  Service: Cardiovascular;  Laterality: N/A;   SPINAL FUSION  07/05/2018   TUBAL LIGATION  12/27/2005   WISDOM TOOTH EXTRACTION     WRIST GANGLION EXCISION Right 09/06/2003   WRIST GANGLION EXCISION Left 09/04/1999   Patient Active Problem List   Diagnosis Date Noted   Nausea and vomiting 12/07/2022   Abdominal pain, chronic, right upper quadrant 10/22/2022   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   Somatic dysfunction of spine, thoracic 09/10/2022   Carbuncle of breast 03/21/2022   Shoulder pain 03/21/2022   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   Atypical squamous cells  of undetermined significance (ASCUS) on Papanicolaou smear of cervix 02/02/2022   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   Heartburn 11/14/2021   Dysuria 11/14/2021   Migraine headache 10/09/2021   Shingles outbreak 12/07/2020   Chronic right shoulder pain 10/27/2020   Primary osteoarthritis involving multiple joints 10/27/2020   Epistaxis 08/15/2020   Well adult exam 06/08/2020   Abnormal stress test 12/25/2019   Atypical  squamous cells of undetermined significance (ASC-US) on cervical Pap smear 10/30/2019   Cyst of right ovary 10/30/2019   History of anemia 10/30/2019   History of dysmenorrhea 10/30/2019   History of hematuria 10/30/2019   Mittelschmerz 10/30/2019   Cardiac murmur 09/04/2019   DOE (dyspnea on exertion) 06/10/2019   Hidradenitis suppurativa 05/06/2018   Degeneration of intervertebral disc of cervical spine without prolapsed disc 08/21/2017   Prolapsed cervical intervertebral disc 08/21/2017   Fatigue 06/04/2017   Obesity 04/05/2017   Multiple benign nevi 12/27/2015   Irritant dermatitis 11/17/2014   Lumbar facet joint pain 10/26/2014   Sacroiliitis (HCC) 10/26/2014   Insomnia 10/07/2013   Breast lump 06/08/2013   Bloating symptom 04/30/2012   Vitamin D deficiency 09/03/2007   Sickle cell trait (HCC) 05/20/2007    PCP: Jacinta Shoe  REFERRING PROVIDER: Clementeen Graham   REFERRING DIAG: R shoulder pain  THERAPY DIAG:  Cervicalgia  Other muscle spasm  Acute pain of right shoulder  Rationale for Evaluation and Treatment: Rehabilitation  ONSET DATE:   SUBJECTIVE:                                                                                                                                                                                      SUBJECTIVE STATEMENT: Pt reports ongoing R shoulder pain for about 1 year. Did have recent injection, which has helped,  but still feels tight. Mostly in R UT and into deltoid, and into R shoulder blade.  Mild pain radiating into UE at times. Denies tingling into hand.  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 Also has ongoing migraines, mostly feels behind R eye.  Hand dominance: Right  PERTINENT HISTORY: none  PAIN:  Are you having pain? Yes: NPRS scale: 5-6/10 Pain location: R shoulder  Pain description: sore, tight Aggravating  factors: lifting , sleeping,  Relieving factors: rest   PRECAUTIONS: None  RED FLAGS: None   WEIGHT BEARING RESTRICTIONS: No  FALLS:  Has patient fallen in last 6 months? No  PLOF: Independent  PATIENT GOALS:  decreased pain, improved use of R arm,   NEXT MD VISIT:  OBJECTIVE:   DIAGNOSTIC FINDINGS:    PATIENT SURVEYS :  FOTO 49.3    COGNITION: Overall cognitive status: Within functional limits for tasks assessed     SENSATION: WFL  POSTURE: WFL  UPPER EXTREMITY ROM:  Neck: mild limitation for rotation to R, slight pain, Flexion, ext: WFL.  Active /Passive ROM Right eval Left eval  Shoulder flexion 120/wfl wfl  Shoulder extension    Shoulder abduction 130/wf; wfl  Shoulder adduction    Shoulder internal rotation    Shoulder external rotation    Elbow flexion    Elbow extension    Wrist flexion    Wrist extension    Wrist ulnar deviation    Wrist radial deviation    Wrist pronation    Wrist supination    (Blank rows = not tested)  UPPER EXTREMITY MMT:  MMT Right eval Left eval  Shoulder flexion 3-   Shoulder extension    Shoulder abduction 3-   Shoulder adduction    Shoulder internal rotation 4   Shoulder external rotation 4   Middle trapezius    Lower trapezius    Elbow flexion    Elbow extension    Wrist flexion    Wrist extension    Wrist ulnar deviation    Wrist radial deviation    Wrist pronation    Wrist supination    Grip strength (lbs)    (Blank rows = not tested)  SHOULDER SPECIAL TESTS: Painful arc,    JOINT MOBILITY TESTING:  Prom WFL, pain limiting AROM.   PALPATION:  Tightness, tenderness in R UT, bil cervical paraspinals, mild in R SO, pain and tenderness in mid and low trap on R, and in R Teres/infra at posterior shoulder    TODAY'S TREATMENT:                                                                                                                                         DATE:   02/22/23:  Manual:STM/  TPR to R UT and R mid/low trap on R.  Ther ex: see below for HEP  PATIENT EDUCATION:  Education details: PT POC, Exam findings, HEP Person educated: Patient Education method: Explanation, Demonstration, Tactile cues, Verbal cues, and Handouts Education comprehension: verbalized understanding, returned demonstration, verbal cues required, tactile cues required, and needs further education   HOME EXERCISE PROGRAM: Access Code: [email or web address] URL: [email or web address] Date: 02/24/2023 Prepared by: Sedalia Muta  Exercises - Supine Bilateral Shoulder Protraction  - 1 x daily - 1-2 sets - 10 reps - 3-5 hold - Seated Cervical Sidebending Stretch  - 2 x daily - 3 reps - 30 hold - Single Arm Shoulder Post Capsule Stretch  - 2 x daily - 3 reps - 20 hold  ASSESSMENT:  CLINICAL IMPRESSION: Patient presents with primary complaint of increased pain in neck and R shoulder. She has increased muscle tension in R UT  region, and into R shoulder blade. Increased pain in shoulder blade region with shoulder elevation. AROM quite limited by pain, but PROM with minimal limitation. Pt with decreased ability for full functional activities and work duties due to pain and deficit. Pt to benefit from skilled PT to improve.    OBJECTIVE IMPAIRMENTS: decreased activity tolerance, decreased ROM, decreased strength, increased muscle spasms, impaired UE functional use, and pain.   ACTIVITY LIMITATIONS: carrying, lifting, sleeping, dressing, reach over head, hygiene/grooming, and locomotion level  PARTICIPATION LIMITATIONS: meal prep, cleaning, laundry, driving, shopping, community activity, occupation, and yard work  PERSONAL FACTORS:  none  are also affecting patient's functional outcome.   REHAB POTENTIAL: Good  CLINICAL DECISION MAKING: Stable/uncomplicated  EVALUATION COMPLEXITY: Low  GOALS: Goals reviewed with patient? Yes  SHORT TERM GOALS: Target date: 03/08/2023    Pt to be  independent with initial HEP  Goal status: INITIAL  2.  Pt to demo soft tissue restrictions to be reduced by at least 50 % in neck and shoulder blade region.   Goal status: INITIAL    LONG TERM GOALS: Target date: 04/19/2023   Pt to be independent with final HEP  Goal status: INITIAL  2.  Pt to report improved ROM for R shoulder and neck to be [facility], and pain free.    Goal status: INITIAL  3.  Pt to demo improved strength of R shoulder and scapular region to be at least 4+/5 to improve ability for reach, lift, and work duties.    Goal status: INITIAL  4.  Pt to report overall pain and symptoms to be reduced by at least 75 %.   Goal status: INITIAL   PLAN: PT FREQUENCY: 1-2x/week  PT DURATION: 8 weeks  PLANNED INTERVENTIONS: Therapeutic exercises, Therapeutic activity, Neuromuscular re-education, Patient/Family education, Self Care, Joint mobilization, Joint manipulation, Stair training, Orthotic/Fit training, DME instructions, Aquatic Therapy, Dry Needling, Electrical stimulation, Cryotherapy, Moist heat, Taping, Ultrasound, Ionotophoresis 4mg /ml Dexamethasone, Manual therapy,  Vasopneumatic device, Traction, Spinal manipulation, Spinal mobilization,Balance training, Gait training,   PLAN FOR NEXT SESSION:    Sedalia Muta, PT, DPT 7:45 PM  02/24/23  PHYSICAL THERAPY DISCHARGE SUMMARY  Visits from Start of Care: 1  Plan: Patient agrees to discharge.  Patient goals were not met. Patient is being discharged due to - not returning to PT

## 2023-02-24 ENCOUNTER — Encounter: Payer: Self-pay | Admitting: Physical Therapy

## 2023-02-28 NOTE — Progress Notes (Signed)
[facility] [address] [place] [address] Phone: [phone] Subjective:    I'm seeing this patient by the request  of:  Plotnikov, Georgina Quint, MD  CC: Back and neck pain follow-up  [id number]  Angel [name] is a 47 y.o. female coming in with complaint of back and neck pain. OMT on 12/12/2022. Also saw Dr. Denyse Amass for shoulder pain. Patient states worsening shoulder pain over the past 3 weeks, had injection with Dr. Denyse Amass which was helpful for shoulder sx. C/O continued tightness in the periscapular, upper back, and neck. Hx of migraines, no flares over the past week.   Medications patient has been prescribed:   Taking:         Reviewed prior external information including notes and imaging from previsou exam, outside providers and external EMR if available.   As well as notes that were available from care everywhere and other healthcare systems.  Past medical history, social, surgical and family history all reviewed in electronic medical record.  No pertanent information unless stated regarding to the chief complaint.   Past Medical History:  Diagnosis Date   Atypical squamous cells of undetermined significance (ASC-US) on cervical Pap smear 10/30/2019   Bloating symptom 04/30/2012   9/13 2 hrs long cramping episodes associated w/diarrhea x 3 episodes R/o a viral illness, GS, pancreatitis and other dx's 9/18 gluten free diet   Breast lump 06/08/2013   Breast mass 06/2013   bilateral   Cardiac murmur 09/04/2019   Cyst of right ovary 10/30/2019   Degeneration of intervertebral disc of cervical spine without prolapsed disc 08/21/2017   DOE (dyspnea on exertion) 06/10/2019   GERD (gastroesophageal reflux disease) 08/2014   takes Protonix daily   Herniated disc    History of anemia 10/30/2019   History of dysmenorrhea 10/30/2019   History of hematuria 10/30/2019   Insomnia 10/07/2013   Chronic    Irritant dermatitis 11/17/2014   Migraine  headache    Mittelschmerz 10/30/2019   Nausea and vomiting 12/07/2022   Overweight 09/04/2019   Sickle cell trait (HCC)    Vitamin D deficiency 2009   Vitamin D deficiency 09/03/2007   Chronic 2/14 Risks associated with treatment noncompliance were discussed. Compliance was encouraged.      Allergies  Allergen Reactions   Other Dermatitis    Surgical glues Surgical glues   Butalbital-Acetaminophen     Did not like the effect   Trazodone And Nefazodone    Wound Dressing Adhesive     Other reaction(s): Rash:itching   Zolpidem     Ate in her sleep     Review of Systems:  No headache, visual changes, nausea, vomiting, diarrhea, constipation, dizziness, abdominal pain, skin rash, fevers, chills, night sweats, weight loss, swollen lymph nodes, body aches, joint swelling, chest pain, shortness of breath, mood changes. POSITIVE muscle aches  Objective  Blood pressure 100/62, pulse 90, height 5\' 6"  (1.676 m), weight 198 lb (89.8 kg), SpO2 98%.   General: No apparent distress alert and oriented x3 mood and affect normal, dressed appropriately.  HEENT: Pupils equal, extraocular movements intact  Respiratory: Patient's speak in full sentences and does not appear short of breath  Cardiovascular: No lower extremity edema, non tender, no erythema  Back exam does have some mild loss lordosis.  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  Discussed posture and ergonomics, discussed avoiding certain activities.  Increase activity as tolerated.  No change in medications.  Does have pain in the sacroiliitis  area that we will need to continue to monitor as well.  Will follow-up again in 2 months    Nonallopathic problems  Decision today to treat with OMT was based on Physical Exam  After verbal consent patient was treated with HVLA, ME, FPR techniques in cervical, rib, thoracic, lumbar, and sacral  areas  Patient tolerated the procedure well with improvement in symptoms  Patient given exercises, stretches and lifestyle modifications  See medications in patient instructions if given  Patient will follow up in 4-8 weeks      The above documentation has been reviewed and is accurate and complete Judi Saa, DO        Note: This dictation was prepared with Dragon dictation along with smaller phrase technology. Any transcriptional errors that result from this process are unintentional.

## 2023-03-01 NOTE — Telephone Encounter (Signed)
PA has been APPROVED from 02/16/2023-02/13/2024

## 2023-03-06 DIAGNOSIS — Z1231 Encounter for screening mammogram for malignant neoplasm of breast: Secondary | ICD-10-CM | POA: Diagnosis not present

## 2023-03-06 DIAGNOSIS — Z139 Encounter for screening, unspecified: Secondary | ICD-10-CM | POA: Diagnosis not present

## 2023-03-06 DIAGNOSIS — Z01419 Encounter for gynecological examination (general) (routine) without abnormal findings: Secondary | ICD-10-CM | POA: Diagnosis not present

## 2023-03-06 DIAGNOSIS — R87810 Cervical high risk human papillomavirus (HPV) DNA test positive: Secondary | ICD-10-CM | POA: Diagnosis not present

## 2023-03-06 DIAGNOSIS — R87610 Atypical squamous cells of undetermined significance on cytologic smear of cervix (ASC-US): Secondary | ICD-10-CM | POA: Diagnosis not present

## 2023-03-08 ENCOUNTER — Encounter: Payer: Self-pay | Admitting: Family Medicine

## 2023-03-08 ENCOUNTER — Ambulatory Visit: Payer: BC Managed Care – PPO | Admitting: Family Medicine

## 2023-03-08 VITALS — BP 100/62 | HR 90 | Ht 66.0 in | Wt 198.0 lb

## 2023-03-08 DIAGNOSIS — M940 Chondrocostal junction syndrome [Tietze]: Secondary | ICD-10-CM

## 2023-03-08 DIAGNOSIS — M9903 Segmental and somatic dysfunction of lumbar region: Secondary | ICD-10-CM

## 2023-03-08 DIAGNOSIS — M9908 Segmental and somatic dysfunction of rib cage: Secondary | ICD-10-CM

## 2023-03-08 DIAGNOSIS — M9904 Segmental and somatic dysfunction of sacral region: Secondary | ICD-10-CM | POA: Diagnosis not present

## 2023-03-08 DIAGNOSIS — M9901 Segmental and somatic dysfunction of cervical region: Secondary | ICD-10-CM

## 2023-03-08 DIAGNOSIS — M9902 Segmental and somatic dysfunction of thoracic region: Secondary | ICD-10-CM

## 2023-03-08 NOTE — Assessment & Plan Note (Signed)
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  Discussed posture and ergonomics, discussed avoiding certain activities.  Increase activity as tolerated.  No change in medications.  Does have pain in the sacroiliitis area that we will need to continue to monitor as well.  Will follow-up again in 2 months

## 2023-03-11 ENCOUNTER — Other Ambulatory Visit: Payer: Self-pay | Admitting: Obstetrics and Gynecology

## 2023-03-11 ENCOUNTER — Other Ambulatory Visit: Payer: Self-pay | Admitting: Diagnostic Radiology

## 2023-03-11 DIAGNOSIS — R928 Other abnormal and inconclusive findings on diagnostic imaging of breast: Secondary | ICD-10-CM

## 2023-03-11 DIAGNOSIS — R92331 Mammographic heterogeneous density, right breast: Secondary | ICD-10-CM

## 2023-03-15 ENCOUNTER — Telehealth (INDEPENDENT_AMBULATORY_CARE_PROVIDER_SITE_OTHER): Payer: BC Managed Care – PPO | Admitting: Internal Medicine

## 2023-03-15 DIAGNOSIS — U071 COVID-19: Secondary | ICD-10-CM | POA: Diagnosis not present

## 2023-03-15 DIAGNOSIS — E559 Vitamin D deficiency, unspecified: Secondary | ICD-10-CM | POA: Diagnosis not present

## 2023-03-15 DIAGNOSIS — R5383 Other fatigue: Secondary | ICD-10-CM

## 2023-03-15 MED ORDER — HYDROCODONE BIT-HOMATROP MBR 5-1.5 MG/5ML PO SOLN: 5.0000 mL | Freq: Four times a day (QID) | ORAL | 0 refills | Status: AC | PRN

## 2023-03-15 MED ORDER — NIRMATRELVIR/RITONAVIR (PAXLOVID)TABLET: 3.0000 | ORAL_TABLET | Freq: Two times a day (BID) | ORAL | 0 refills | Status: AC

## 2023-03-15 NOTE — Progress Notes (Unsigned)
Patient ID: Angel [name], female   DOB: 1976/03/05, 47 y.o.   MRN: [id number]  Virtual Visit via Video Note  I connected with Angel [name] on Mar 15 2023 at  3:20 PM EDT by a video enabled telemedicine application and verified that I am speaking with the correct person using two identifiers.  Location of all participants today Patient: at home Provider: at office   I discussed the limitations of evaluation and management by telemedicine and the availability of in person appointments. The patient expressed understanding and agreed to proceed.  History of Present Illness: Here with 2 days onset symptoms of URI with cough, congestion, feverish, mild ST and ear fullness.  Pt found to be covid + by home testing yesterday. Pt denies chest pain, increased sob or doe, wheezing, orthopnea, PND, increased LE swelling, palpitations, dizziness or syncope.  Denies worsening reflux, abd pain, dysphagia, n/v, bowel change or blood.   Pt denies polydipsia, polyuria, or new focal neuro s/s.   Does c/o ongoing fatigue, but denies signficant daytime hypersomnolence. Past Medical History:  Diagnosis Date   Atypical squamous cells of undetermined significance (ASC-US) on cervical Pap smear 10/30/2019   Bloating symptom 04/30/2012   9/13 2 hrs long cramping episodes associated w/diarrhea x 3 episodes R/o a viral illness, GS, pancreatitis and other dx's 9/18 gluten free diet   Breast lump 06/08/2013   Breast mass 06/2013   bilateral   Cardiac murmur 09/04/2019   Cyst of right ovary 10/30/2019   Degeneration of intervertebral disc of cervical spine without prolapsed disc 08/21/2017   DOE (dyspnea on exertion) 06/10/2019   GERD (gastroesophageal reflux disease) 08/2014   takes Protonix daily   Herniated disc    History of anemia 10/30/2019   History of dysmenorrhea 10/30/2019   History of hematuria 10/30/2019   Insomnia 10/07/2013   Chronic    Irritant dermatitis 11/17/2014   Migraine headache    Mittelschmerz  10/30/2019   Nausea and vomiting 12/07/2022   Overweight 09/04/2019   Sickle cell trait (HCC)    Vitamin D deficiency 2009   Vitamin D deficiency 09/03/2007   Chronic 2/14 Risks associated with treatment noncompliance were discussed. Compliance was encouraged.     Past Surgical History:  Procedure Laterality Date   BILATERAL SALPINGECTOMY  09/26/2015   Procedure: BILATERAL SALPINGECTOMY;  Surgeon: Osborn Coho, MD;  Location: [facility];  Service: Gynecology;;   BREAST BIOPSY Bilateral 07/13/2013   Procedure: BILATERAL EXCISION BREAS MASSES;  Surgeon: Shelly Rubenstein, MD;  Location:  [facility];  Service: General;  Laterality: Bilateral;   BREAST CYST EXCISION Left 2014   4 cysts removed   DE QUERVAIN'S RELEASE Right 09/06/2003   DILITATION & CURRETTAGE/HYSTROSCOPY WITH NOVASURE ABLATION N/A 09/26/2015   Procedure: DILATATION & CURETTAGE/HYSTEROSCOPY WITH NOVASURE ABLATION;  Surgeon: Osborn Coho, MD;  Location: [facility];  Service: Gynecology;  Laterality: N/A;   LAPAROSCOPIC OVARIAN CYSTECTOMY Right 09/26/2015   Procedure: LAPAROSCOPIC Right OVARIAN CYSTECTOMY, Bilateral Peritoneal Biopsies over Uteralsacral area;  Surgeon: Osborn Coho, MD;  Location: [facility];  Service: Gynecology;  Laterality: Right;   LEFT HEART CATH AND CORONARY ANGIOGRAPHY N/A 12/30/2019   Procedure: LEFT HEART CATH AND CORONARY ANGIOGRAPHY;  Surgeon: Marykay Lex, MD;  Location: [facility];  Service: Cardiovascular;  Laterality: N/A;   SPINAL FUSION  07/05/2018   TUBAL LIGATION  12/27/2005   WISDOM TOOTH EXTRACTION     WRIST GANGLION EXCISION Right 09/06/2003   WRIST GANGLION EXCISION Left  09/04/1999    reports that she has never smoked. She has never used smokeless tobacco. She reports that she does not drink alcohol and does not use drugs. family history includes Cancer in her father; Diabetes in her father; Heart disease in her father; Hypertension in her father; Sickle cell anemia in her  daughter and son. Allergies  Allergen Reactions   Other Dermatitis    Surgical glues Surgical glues   Butalbital-Acetaminophen     Did not like the effect   Trazodone And Nefazodone    Wound Dressing Adhesive     Other reaction(s): Rash:itching   Zolpidem     Ate in her sleep   Current Outpatient Medications on File Prior to Visit  Medication Sig Dispense Refill   acetaminophen (TYLENOL) 500 MG tablet Take 1,000 mg by mouth daily.      albuterol (VENTOLIN HFA) 108 (90 Base) MCG/ACT inhaler INHALE 2 PUFFS INTO THE LUNGS EVERY 6 HOURS AS NEEDED FOR WHEEZING OR SHORTNESS OF BREATH 6.7 g 3   betamethasone dipropionate 0.05 % cream Apply 1 application topically daily as needed (Eczema).      clindamycin (CLINDAGEL) 1 % gel Apply 1 application topically daily as needed (acne).   11   doxycycline (VIBRAMYCIN) 50 MG capsule SMARTSIG:1-2 Capsule(s) By Mouth Every Evening     hydrocortisone (ANUSOL-HC) 2.5 % rectal cream Use bid (Patient taking differently: Place 1 application  rectally 2 (two) times daily as needed for hemorrhoids.) 30 g 1   ipratropium (ATROVENT) 0.06 % nasal spray Place 2 sprays into both nostrils 2 (two) times daily.     LUPRON DEPOT, 45-MONTH, 11.25 MG injection Inject into the muscle.     Misc. Devices (ALL-BODY MASSAGE) MISC Please provide patient with 15 minutes of gentle, comforting massage in the shoulder, neck, upper back region every evening 30 minutes prior to bedtime.  Repeat as often as needed. 15 each 13   Multiple Vitamin (MULTI-VITAMIN) tablet Take 1 tablet by mouth daily.     ondansetron (ZOFRAN) 4 MG tablet Take 1 tablet (4 mg total) by mouth every 8 (eight) hours as needed for nausea or vomiting. 20 tablet 0   promethazine (PHENERGAN) 25 MG tablet Take 1 tablet (25 mg total) by mouth every 4 (four) hours as needed for up to 7 days for nausea or vomiting. 30 tablet 3   Rimegepant Sulfate (NURTEC) 75 MG TBDP TAKE 1 TABLET BY MOUTH ONCE EVERY OTHER DAY AS NEEDED 8  tablet 11   spironolactone (ALDACTONE) 50 MG tablet Take 50 mg by mouth daily.      tiZANidine (ZANAFLEX) 4 MG tablet Take 4 mg by mouth daily as needed (Back pain).      traMADol (ULTRAM) 50 MG tablet Take 1 tablet (50 mg total) by mouth every 8 (eight) hours as needed for severe pain. 15 tablet 0   Vitamin D, Ergocalciferol, 50000 units CAPS TAKE ONE CAPSULE BY MOUTH EVERY MONTH 3 capsule 3   zaleplon (SONATA) 5 MG capsule Take 1-2 capsules (5-10 mg total) by mouth at bedtime as needed for sleep. 60 capsule 5   No current facility-administered medications on file prior to visit.     Observations/Objective: Alert, NAD, appropriate mood and affect, resps normal, cn 2-12 intact, moves all 4s, no visible rash or swelling . Lab Results  Component Value Date   WBC 4.8 12/07/2022   HGB 14.4 12/07/2022   HCT 42.9 12/07/2022   PLT 293.0 12/07/2022   GLUCOSE 80 12/07/2022  CHOL 207 (H) 11/21/2022   TRIG 50.0 11/21/2022   HDL 77.60 11/21/2022   LDLCALC 119 (H) 11/21/2022   ALT 15 11/21/2022   AST 17 11/21/2022   NA 138 12/07/2022   K 3.8 12/07/2022   CL 101 12/07/2022   CREATININE 0.92 12/07/2022   BUN 14 12/07/2022   CO2 27 12/07/2022   TSH 2.22 11/21/2022   HGBA1C 5.6 10/18/2014   Assessment and Plan: See notes  Follow Up Instructions: Seen otes   I discussed the assessment and treatment plan with the patient. The patient was provided an opportunity to ask questions and all were answered. The patient agreed with the plan and demonstrated an understanding of the instructions.   The patient was advised to call back or seek an in-person evaluation if the symptoms worsen or if the condition fails to improve as anticipated.   Oliver Barre, MD

## 2023-03-17 ENCOUNTER — Encounter: Payer: Self-pay | Admitting: Internal Medicine

## 2023-03-17 DIAGNOSIS — U071 COVID-19: Secondary | ICD-10-CM | POA: Insufficient documentation

## 2023-03-17 NOTE — Assessment & Plan Note (Signed)
Mild to mod, for paxlovid course asd, cough med prn,,  to f/u any worsening symptoms or concerns

## 2023-03-17 NOTE — Assessment & Plan Note (Signed)
Last vitamin D Lab Results  Component Value Date   VD25OH 30.86 11/21/2022   Low, to start oral replacement

## 2023-03-17 NOTE — Assessment & Plan Note (Signed)
Mild to mod, for tx as above, to f/u with lab testing next visit per pt,  to f/u any worsening symptoms or concerns

## 2023-03-17 NOTE — Patient Instructions (Signed)
Please take all new medication as prescribed 

## 2023-03-18 ENCOUNTER — Ambulatory Visit: Payer: BC Managed Care – PPO

## 2023-03-18 ENCOUNTER — Ambulatory Visit
Admission: RE | Admit: 2023-03-18 | Discharge: 2023-03-18 | Disposition: A | Discharge status: Home / Self Care | Payer: BC Managed Care – PPO | Source: Ambulatory Visit | Attending: Obstetrics and Gynecology | Admitting: Obstetrics and Gynecology

## 2023-03-18 DIAGNOSIS — R928 Other abnormal and inconclusive findings on diagnostic imaging of breast: Secondary | ICD-10-CM

## 2023-03-26 DIAGNOSIS — J209 Acute bronchitis, unspecified: Secondary | ICD-10-CM | POA: Diagnosis not present

## 2023-03-26 DIAGNOSIS — R051 Acute cough: Secondary | ICD-10-CM | POA: Diagnosis not present

## 2023-04-23 DIAGNOSIS — R87610 Atypical squamous cells of undetermined significance on cytologic smear of cervix (ASC-US): Secondary | ICD-10-CM | POA: Diagnosis not present

## 2023-04-23 DIAGNOSIS — R87810 Cervical high risk human papillomavirus (HPV) DNA test positive: Secondary | ICD-10-CM | POA: Diagnosis not present

## 2023-04-30 NOTE — Progress Notes (Deleted)
[facility] [address] [place] [address] Phone: [phone] Subjective:    I'm seeing this patient by the request  of:  Plotnikov, Georgina Quint, MD  CC: Back and neck pain follow-up  [id number]  Angel [name] is a 47 y.o. female coming in with complaint of back and neck pain. OMT 03/08/2023. Patient states   Medications patient has been prescribed: None  Taking:         Reviewed prior external information including notes and imaging from previsou exam, outside providers and external EMR if available.   As well as notes that were available from care everywhere and other healthcare systems.  Past medical history, social, surgical and family history all reviewed in electronic medical record.  No pertanent information unless stated regarding to the chief complaint.   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  Compliance was encouraged.      Allergies  Allergen Reactions   Other Dermatitis    Surgical glues Surgical glues   Butalbital-Acetaminophen     Did not like the effect   Trazodone And Nefazodone    Wound Dressing Adhesive     Other reaction(s): Rash:itching   Zolpidem     Ate in her sleep     Review of Systems:  No headache, visual changes, nausea, vomiting, diarrhea, constipation, dizziness, abdominal pain, skin rash, fevers, chills, night sweats, weight loss, swollen lymph nodes, body aches, joint swelling, chest pain, shortness of breath, mood changes. POSITIVE muscle aches  Objective  There were no vitals taken for this visit.   General: No apparent distress alert and oriented x3 mood and affect normal, dressed appropriately.  HEENT: Pupils equal, extraocular movements intact  Respiratory: Patient's speak in full sentences and does not appear short of breath  Cardiovascular: No lower extremity edema, non tender, no erythema  Gait MSK:  Back   Osteopathic findings  C2 flexed rotated and side bent right C6 flexed rotated and side bent left T3 extended rotated and side bent right inhaled rib T9 extended rotated and side bent left L2 flexed rotated and side bent right Sacrum right on right       Assessment and Plan:  No problem-specific Assessment & Plan notes found for this encounter.    Nonallopathic problems  Decision today to treat with OMT was based on Physical Exam  After verbal consent patient was treated with  HVLA, ME, FPR techniques in cervical, rib, thoracic, lumbar, and sacral  areas  Patient tolerated the procedure well with improvement in symptoms  Patient given exercises, stretches and lifestyle modifications  See medications in patient instructions if given  Patient will follow up in 4-8 weeks     The above documentation has been reviewed and is accurate and complete Judi Saa, DO         Note: This dictation was prepared  with Dragon dictation along with smaller phrase technology. Any transcriptional errors that result from this process are unintentional.

## 2023-05-01 ENCOUNTER — Ambulatory Visit: Payer: BC Managed Care – PPO | Admitting: Family Medicine

## 2023-05-02 DIAGNOSIS — J069 Acute upper respiratory infection, unspecified: Secondary | ICD-10-CM | POA: Diagnosis not present

## 2023-05-13 ENCOUNTER — Other Ambulatory Visit: Payer: Self-pay | Admitting: Internal Medicine

## 2023-05-22 ENCOUNTER — Ambulatory Visit: Payer: BC Managed Care – PPO | Admitting: Internal Medicine

## 2023-05-22 ENCOUNTER — Encounter: Payer: Self-pay | Admitting: Internal Medicine

## 2023-05-22 VITALS — BP 120/70 | HR 74 | Temp 98.3°F | Ht 66.0 in | Wt 198.0 lb

## 2023-05-22 DIAGNOSIS — E559 Vitamin D deficiency, unspecified: Secondary | ICD-10-CM

## 2023-05-22 DIAGNOSIS — F4321 Adjustment disorder with depressed mood: Secondary | ICD-10-CM | POA: Diagnosis not present

## 2023-05-22 DIAGNOSIS — G4700 Insomnia, unspecified: Secondary | ICD-10-CM

## 2023-05-22 DIAGNOSIS — Z23 Encounter for immunization: Secondary | ICD-10-CM

## 2023-05-22 DIAGNOSIS — G43509 Persistent migraine aura without cerebral infarction, not intractable, without status migrainosus: Secondary | ICD-10-CM

## 2023-05-22 DIAGNOSIS — K5790 Diverticulosis of intestine, part unspecified, without perforation or abscess without bleeding: Secondary | ICD-10-CM

## 2023-05-22 LAB — CBC WITH DIFFERENTIAL/PLATELET
Basophils Absolute: 0 10*3/uL (ref 0.0–0.1)
Basophils Relative: 0.6 % (ref 0.0–3.0)
Eosinophils Absolute: 0.1 10*3/uL (ref 0.0–0.7)
Eosinophils Relative: 1.3 % (ref 0.0–5.0)
HCT: 39.4 % (ref 36.0–46.0)
Hemoglobin: 12.9 g/dL (ref 12.0–15.0)
Lymphocytes Relative: 31.7 % (ref 12.0–46.0)
Lymphs Abs: 1.4 10*3/uL (ref 0.7–4.0)
MCHC: 32.7 g/dL (ref 30.0–36.0)
MCV: 87.5 fL (ref 78.0–100.0)
Monocytes Absolute: 0.4 10*3/uL (ref 0.1–1.0)
Monocytes Relative: 9.4 % (ref 3.0–12.0)
Neutro Abs: 2.5 10*3/uL (ref 1.4–7.7)
Neutrophils Relative %: 57 % (ref 43.0–77.0)
Platelets: 291 10*3/uL (ref 150.0–400.0)
RBC: 4.51 Mil/uL (ref 3.87–5.11)
RDW: 14.5 % (ref 11.5–15.5)
WBC: 4.3 10*3/uL (ref 4.0–10.5)

## 2023-05-22 LAB — COMPREHENSIVE METABOLIC PANEL
ALT: 11 U/L (ref 0–35)
AST: 14 U/L (ref 0–37)
Albumin: 4 g/dL (ref 3.5–5.2)
Alkaline Phosphatase: 55 U/L (ref 39–117)
BUN: 13 mg/dL (ref 6–23)
CO2: 28 meq/L (ref 19–32)
Calcium: 9.4 mg/dL (ref 8.4–10.5)
Chloride: 106 meq/L (ref 96–112)
Creatinine, Ser: 0.84 mg/dL (ref 0.40–1.20)
GFR: 82.96 mL/min (ref >60.00)
Glucose, Bld: 102 mg/dL — ABNORMAL HIGH (ref 70–99)
Potassium: 4.1 meq/L (ref 3.5–5.1)
Sodium: 139 meq/L (ref 135–145)
Total Bilirubin: 0.4 mg/dL (ref 0.2–1.2)
Total Protein: 6.4 g/dL (ref 6.0–8.3)

## 2023-05-22 MED ORDER — ESZOPICLONE 3 MG PO TABS: 3.0000 mg | ORAL_TABLET | Freq: Every day | ORAL | 1 refills | Status: DC

## 2023-05-22 MED ORDER — VITAMIN D (ERGOCALCIFEROL) 50000 UNITS PO CAPS: ORAL_CAPSULE | ORAL | 3 refills | Status: DC

## 2023-05-22 NOTE — Assessment & Plan Note (Signed)
Lorazepam is not working well... - d/c Zaleplon

## 2023-05-22 NOTE — Assessment & Plan Note (Signed)
Dr Bosie Clos 2022 colonoscopy

## 2023-05-22 NOTE — Assessment & Plan Note (Signed)
Father and GF died in summer 2024

## 2023-05-22 NOTE — Assessment & Plan Note (Signed)
Intolerant of zolpidem- eating in her sleep [place] did not help Will try [place]

## 2023-05-22 NOTE — Progress Notes (Signed)
Subjective:  Patient ID: Angel [name], female    DOB: [birth year]  Age: 47 y.o. MRN: [id number]  CC: Follow-up (6 MNTH F/U)   HPI Angel [name] presents for insomnia Intolerant of zolpidem- eating in her sleep Sonata did not help Will try [place] Father and GF died in summer 2024 - grief  Dr Bosie Clos 2022 colonoscopy   Outpatient Medications Prior to Visit  Medication Sig Dispense Refill   acetaminophen (TYLENOL) 500 MG tablet Take 1,000 mg by mouth daily.      albuterol (VENTOLIN HFA) 108 (90 Base) MCG/ACT inhaler INHALE 2 PUFFS INTO THE LUNGS EVERY 6 HOURS AS NEEDED FOR WHEEZING OR SHORTNESS OF BREATH 6.7 g 3   betamethasone dipropionate 0.05 % cream Apply 1 application topically daily as needed (Eczema).      clindamycin (CLINDAGEL) 1 % gel Apply 1 application topically daily as needed (acne).   11   doxycycline (VIBRAMYCIN) 50 MG capsule SMARTSIG:1-2 Capsule(s) By Mouth Every Evening     hydrocortisone (ANUSOL-HC) 2.5 % rectal cream Use bid (Patient taking differently: Place 1 application  rectally 2 (two) times daily as needed for hemorrhoids.) 30 g 1   ipratropium (ATROVENT) 0.06 % nasal spray Place 2 sprays into both nostrils 2 (two) times daily.     LORazepam (ATIVAN) 1 MG tablet TAKE 1 TO 2 TABLETS BY MOUTH AT BEDTIME AS NEEDED FOR SLEEP 60 tablet 3   Misc. Devices (ALL-BODY MASSAGE) MISC Please provide patient with 15 minutes of gentle, comforting massage in the shoulder, neck, upper back region every evening 30 minutes prior to bedtime.  Repeat as often as needed. 15 each 13   Multiple Vitamin (MULTI-VITAMIN) tablet Take 1 tablet by mouth daily.     ondansetron (ZOFRAN) 4 MG tablet Take 1 tablet (4 mg total) by mouth every 8 (eight) hours as needed for nausea or vomiting. 20 tablet 0   Rimegepant Sulfate (NURTEC) 75 MG TBDP TAKE 1 TABLET BY MOUTH ONCE EVERY OTHER DAY AS NEEDED 8 tablet 11   spironolactone (ALDACTONE) 50 MG tablet Take 50 mg by mouth daily.       tiZANidine (ZANAFLEX) 4 MG tablet Take 4 mg by mouth daily as needed (Back pain).      traMADol (ULTRAM) 50 MG tablet Take 1 tablet (50 mg total) by mouth every 8 (eight) hours as needed for severe pain. 15 tablet 0   Vitamin D, Ergocalciferol, 50000 units CAPS TAKE ONE CAPSULE BY MOUTH EVERY MONTH 3 capsule 3   promethazine (PHENERGAN) 25 MG tablet Take 1 tablet (25 mg total) by mouth every 4 (four) hours as needed for up to 7 days for nausea or vomiting. 30 tablet 3   LUPRON DEPOT, 53-MONTH, 11.25 MG injection Inject into the muscle.     zaleplon (SONATA) 5 MG capsule Take 1-2 capsules (5-10 mg total) by mouth at bedtime as needed for sleep. 60 capsule 5   No facility-administered medications prior to visit.    ROS: Review of Systems  Constitutional:  Negative for activity change, appetite change, chills, fatigue and unexpected weight change.  HENT:  Negative for congestion, mouth sores and sinus pressure.   Eyes:  Negative for visual disturbance.  Respiratory:  Negative for cough and chest tightness.   Gastrointestinal:  Negative for abdominal pain and nausea.  Genitourinary:  Negative for difficulty urinating, frequency and vaginal pain.  Musculoskeletal:  Negative for back pain and gait problem.  Skin:  Negative for pallor and rash.  Neurological:  Negative for dizziness, tremors, weakness, numbness and headaches.  Psychiatric/Behavioral:  Positive for sleep disturbance. Negative for confusion and suicidal ideas.     Objective:  BP 120/70 (BP Location: Right Arm, Patient Position: Sitting, Cuff Size: Normal)   Pulse 74   Temp 98.3 F (36.8 C) (Oral)   Ht 5\' 6"  (1.676 m)   Wt 198 lb (89.8 kg)   SpO2 97%   BMI 31.96 kg/m   BP Readings from Last 3 Encounters:  05/22/23 120/70  03/08/23 100/62  02/04/23 104/70    Wt Readings from Last 3 Encounters:  05/22/23 198 lb (89.8 kg)  03/08/23 198 lb (89.8 kg)  02/04/23 198 lb (89.8 kg)    Physical Exam Constitutional:       General: She is not in acute distress.    Appearance: She is well-developed.  HENT:     Head: Normocephalic.     Right Ear: External ear normal.     Left Ear: External ear normal.     Nose: Nose normal.  Eyes:     General:        Right eye: No discharge.        Left eye: No discharge.     Conjunctiva/sclera: Conjunctivae normal.     Pupils: Pupils are equal, round, and reactive to light.  Neck:     Thyroid: No thyromegaly.     Vascular: No JVD.     Trachea: No tracheal deviation.  Cardiovascular:     Rate and Rhythm: Normal rate and regular rhythm.     Heart sounds: Normal heart sounds.  Pulmonary:     Effort: No respiratory distress.     Breath sounds: No stridor. No wheezing.  Abdominal:     General: Bowel sounds are normal. There is no distension.     Palpations: Abdomen is soft. There is no mass.     Tenderness: There is no abdominal tenderness. There is no guarding or rebound.  Musculoskeletal:        General: No tenderness.     Cervical back: Normal range of motion and neck supple. No rigidity.  Lymphadenopathy:     Cervical: No cervical adenopathy.  Skin:    Findings: No erythema or rash.  Neurological:     Cranial Nerves: No cranial nerve deficit.     Motor: No abnormal muscle tone.     Coordination: Coordination normal.     Deep Tendon Reflexes: Reflexes normal.  Psychiatric:        Behavior: Behavior normal.        Thought Content: Thought content normal.        Judgment: Judgment normal.     Lab Results  Component Value Date   WBC 4.8 12/07/2022   HGB 14.4 12/07/2022   HCT 42.9 12/07/2022   PLT 293.0 12/07/2022   GLUCOSE 80 12/07/2022   CHOL 207 (H) 11/21/2022   TRIG 50.0 11/21/2022   HDL 77.60 11/21/2022   LDLCALC 119 (H) 11/21/2022   ALT 15 11/21/2022   AST 17 11/21/2022   NA 138 12/07/2022   K 3.8 12/07/2022   CL 101 12/07/2022   CREATININE 0.92 12/07/2022   BUN 14 12/07/2022   CO2 27 12/07/2022   TSH 2.22 11/21/2022   HGBA1C 5.6  10/18/2014    MM 3D DIAGNOSTIC MAMMOGRAM UNILATERAL RIGHT BREAST  Result Date: 03/18/2023 CLINICAL DATA:  Recall from screening mammography, possible asymmetry involving the retroareolar RIGHT breast at posterior depth visible only on the MLO view. EXAM:  DIGITAL DIAGNOSTIC UNILATERAL RIGHT MAMMOGRAM WITH TOMOSYNTHESIS AND CAD TECHNIQUE: Right digital diagnostic mammography and breast tomosynthesis was performed. The images were evaluated with computer-aided detection. COMPARISON:  Previous exam(s). ACR Breast Density Category c: The breasts are heterogeneously dense, which may obscure small masses. FINDINGS: Spot compression MLO view of the area of concern and full field MLO and mediolateral views were obtained. The asymmetry in the retroareolar location at posterior depth disperses with compression, indicating overlapping fibroglandular tissue and Cooper's ligaments. There is no underlying mass or architectural distortion. No findings suspicious for malignancy. IMPRESSION: No mammographic evidence of malignancy involving the RIGHT breast. RECOMMENDATION: Screening mammogram in one year.(Code:SM-B-01Y) I have discussed the findings and recommendations with the patient. If applicable, a reminder letter will be sent to the patient regarding the next appointment. BI-RADS CATEGORY  1: Negative. Electronically Signed   By: Hulan Saas M.D.   On: 03/18/2023 15:30    Assessment & Plan:   Problem List Items Addressed This Visit     Vitamin D deficiency - Primary    Continue with vitamin D      Insomnia    Lorazepam is not working well... - d/c Zaleplon      Relevant Orders   Comprehensive metabolic panel   CBC with Differential/Platelet   Migraine headache    Better.  Continue with Nurtec      Grief    Father and GF died in summer 2024      Diverticulosis    Dr Bosie Clos 2022 colonoscopy      Other Visit Diagnoses     Need for influenza vaccination       Relevant Orders   Flu  vaccine trivalent PF, 6mos and older(Flulaval,Afluria,Fluarix,Fluzone) (Completed)         Meds ordered this encounter  Medications   Eszopiclone 3 MG TABS    Sig: Take 1 tablet (3 mg total) by mouth at bedtime. Take immediately before bedtime    Dispense:  90 tablet    Refill:  1   Vitamin D, Ergocalciferol, 50000 units CAPS    Sig: TAKE ONE CAPSULE BY MOUTH EVERY MONTH    Dispense:  3 capsule    Refill:  3      Follow-up: Return in about 6 months (around 11/20/2023) for Wellness Exam.  Sonda Primes, MD

## 2023-05-22 NOTE — Assessment & Plan Note (Signed)
Continue with vitamin D 

## 2023-05-22 NOTE — Assessment & Plan Note (Signed)
Better.  Continue with Nurtec

## 2023-07-16 ENCOUNTER — Other Ambulatory Visit: Payer: Self-pay

## 2023-07-16 ENCOUNTER — Encounter: Payer: Self-pay | Admitting: Internal Medicine

## 2023-07-16 ENCOUNTER — Telehealth: Payer: BC Managed Care – PPO | Admitting: Internal Medicine

## 2023-07-16 ENCOUNTER — Other Ambulatory Visit: Payer: Self-pay | Admitting: Internal Medicine

## 2023-07-16 DIAGNOSIS — M545 Low back pain, unspecified: Secondary | ICD-10-CM | POA: Diagnosis not present

## 2023-07-16 DIAGNOSIS — G43509 Persistent migraine aura without cerebral infarction, not intractable, without status migrainosus: Secondary | ICD-10-CM

## 2023-07-16 DIAGNOSIS — E663 Overweight: Secondary | ICD-10-CM | POA: Diagnosis not present

## 2023-07-16 MED ORDER — PHENTERMINE HCL 37.5 MG PO TABS: 37.5000 mg | ORAL_TABLET | Freq: Every day | ORAL | 2 refills | Status: DC

## 2023-07-16 MED ORDER — WEGOVY 0.25 MG/0.5ML ~~LOC~~ SOAJ: 0.2500 mg | SUBCUTANEOUS | 2 refills | Status: DC

## 2023-07-16 NOTE — Progress Notes (Signed)
Virtual Visit via Video Note  I connected with Angel [name] on 08/07/23 at  4:00 PM EST by a video enabled telemedicine application and verified that I am speaking with the correct person using two identifiers.   I discussed the limitations of evaluation and management by telemedicine and the availability of in person appointments. The patient expressed understanding and agreed to proceed.  I was located at our [facility] office. The patient was at home. There was no one else present in the visit.  Chief Complaint  Patient presents with   Weight Loss    Pt is wanting to discuss weight loss options.     History of Present Illness:  The patient is worried about her weight.  Follow-up on the headaches, insomnia   Review of Systems  Constitutional:  Positive for malaise/fatigue. Negative for fever and weight loss.  Neurological:  Positive for headaches. Negative for dizziness and speech change.  Psychiatric/Behavioral:  Negative for depression. The patient is nervous/anxious.      Observations/Objective: The patient appears to be in no acute distress  Assessment and Plan:  Problem List Items Addressed This Visit     RESOLVED: Low back pain   Regarding.  Weight loss should be helpful -see management      Overweight - Primary   Refractory.  BMI is approximately 32 Prescribed Wegovy injections Phentermine daily  Potential benefits of a long term phentermine and semaglutide use as well as potential risks were explained to the patient and were aknowledged.       Migraine headache   Better.  Continue with Nurtec        Meds ordered this encounter  Medications   phentermine (ADIPEX-P) 37.5 MG tablet    Sig: Take 1 tablet (37.5 mg total) by mouth daily before breakfast.    Dispense:  30 tablet    Refill:  2   Semaglutide-Weight Management (WEGOVY) 0.25 MG/0.5ML SOAJ    Sig: Inject 0.25 mg into the skin once a week.    Dispense:  2 mL    Refill:  2    BMI 32,  back pain, asthma     Follow Up Instructions:    I discussed the assessment and treatment plan with the patient. The patient was provided an opportunity to ask questions and all were answered. The patient agreed with the plan and demonstrated an understanding of the instructions.   The patient was advised to call back or seek an in-person evaluation if the symptoms worsen or if the condition fails to improve as anticipated.  I provided face-to-face time during this encounter. We were at different locations.   Sonda Primes, MD

## 2023-07-19 ENCOUNTER — Other Ambulatory Visit (HOSPITAL_COMMUNITY): Payer: Self-pay

## 2023-07-19 ENCOUNTER — Telehealth: Payer: Self-pay

## 2023-07-19 NOTE — Telephone Encounter (Signed)
Pharmacy Patient Advocate Encounter   Received notification from CoverMyMeds that prior authorization for Wegovy 0.25MG /0.5ML auto-injectors is required/requested.   Insurance verification completed.   The patient is insured through [facility] .   Per test claim: PA required; PA submitted to above mentioned insurance via CoverMyMeds Key/confirmation #/EOC [id number] Status is pending

## 2023-07-22 NOTE — Telephone Encounter (Signed)
Pharmacy Patient Advocate Encounter  Received notification from [facility] that Prior Authorization for Wegovy 0.25mg /0.7ml has been DENIED.  Full denial letter will be uploaded to the media tab. See denial reason below.   PA #/Case ID/Reference #: [id number]

## 2023-08-07 ENCOUNTER — Encounter: Payer: Self-pay | Admitting: Internal Medicine

## 2023-08-07 NOTE — Assessment & Plan Note (Signed)
Refractory.  BMI is approximately 32 Prescribed Wegovy injections Phentermine daily  Potential benefits of a long term phentermine and semaglutide use as well as potential risks were explained to the patient and were aknowledged.

## 2023-08-07 NOTE — Assessment & Plan Note (Signed)
Better.  Continue with Nurtec

## 2023-08-07 NOTE — Assessment & Plan Note (Signed)
Regarding.  Weight loss should be helpful -see management

## 2023-09-09 ENCOUNTER — Other Ambulatory Visit: Payer: Self-pay | Admitting: Surgery

## 2023-09-09 DIAGNOSIS — R2231 Localized swelling, mass and lump, right upper limb: Secondary | ICD-10-CM | POA: Diagnosis not present

## 2023-09-20 ENCOUNTER — Other Ambulatory Visit (HOSPITAL_COMMUNITY): Payer: Self-pay | Admitting: Cardiology

## 2023-09-20 ENCOUNTER — Other Ambulatory Visit: Payer: Self-pay | Admitting: Cardiology

## 2023-09-20 DIAGNOSIS — Z8249 Family history of ischemic heart disease and other diseases of the circulatory system: Secondary | ICD-10-CM

## 2023-09-21 ENCOUNTER — Other Ambulatory Visit (HOSPITAL_BASED_OUTPATIENT_CLINIC_OR_DEPARTMENT_OTHER): Payer: BC Managed Care – PPO

## 2023-09-21 LAB — LIPID PANEL W/O CHOL/HDL RATIO
Cholesterol, Total: 188 mg/dL (ref 100–199)
HDL: 69 mg/dL (ref >39)
LDL Chol Calc (NIH): 108 mg/dL — ABNORMAL HIGH (ref 0–99)
Triglycerides: 57 mg/dL (ref 0–149)
VLDL Cholesterol Cal: 11 mg/dL (ref 5–40)

## 2023-09-23 NOTE — Progress Notes (Signed)
[facility] [address] [place] [address] Phone: [phone] Subjective:   Bruce Donath, am serving as a scribe for Dr. Antoine Primas.  I'm seeing this patient by the request  of:  Plotnikov, Georgina Quint, MD  CC: Right shoulder pain  [id number]  Angel [name] is a 48 y.o. female coming in with complaint of R shoulder pain. Had shoulder injection by Dr. Denyse Amass in June 2024. Patient states that pain in shoulder is in supraspinatus an into the scapula. Has hard time sleeping. Pain is constant throughout the day as well. Using IBU and zanaflex.     Patient is scheduled at the end of this month to have a axillary mass excision.  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 Compliance was encouraged.     Past Surgical History:  Procedure Laterality Date   BILATERAL SALPINGECTOMY  09/26/2015   Procedure: BILATERAL  SALPINGECTOMY;  Surgeon: Osborn Coho, MD;  Location: [facility];  Service: Gynecology;;   BREAST BIOPSY Bilateral 07/13/2013   Procedure: BILATERAL EXCISION BREAS MASSES;  Surgeon: Shelly Rubenstein, MD;  Location: [facility];  Service: General;  Laterality: Bilateral;   BREAST CYST EXCISION Left 2014   4 cysts removed   DE QUERVAIN'S RELEASE Right 09/06/2003   DILITATION & CURRETTAGE/HYSTROSCOPY WITH NOVASURE ABLATION N/A 09/26/2015   Procedure: DILATATION & CURETTAGE/HYSTEROSCOPY WITH NOVASURE ABLATION;  Surgeon: Osborn Coho, MD;  Location: [facility];  Service: Gynecology;  Laterality: N/A;   LAPAROSCOPIC OVARIAN CYSTECTOMY Right 09/26/2015   Procedure: LAPAROSCOPIC Right OVARIAN CYSTECTOMY, Bilateral Peritoneal Biopsies over Uteralsacral area;  Surgeon: Osborn Coho, MD;  Location: [facility];  Service: Gynecology;  Laterality: Right;   LEFT HEART CATH AND CORONARY ANGIOGRAPHY N/A 12/30/2019   Procedure: LEFT HEART CATH AND CORONARY ANGIOGRAPHY;  Surgeon: Marykay Lex, MD;  Location: [facility];  Service: Cardiovascular;  Laterality: N/A;   SPINAL FUSION  07/05/2018   TUBAL LIGATION  12/27/2005   WISDOM TOOTH EXTRACTION     WRIST GANGLION EXCISION Right 09/06/2003   WRIST GANGLION EXCISION Left 09/04/1999   Social History   Socioeconomic History   Marital status: Married    Spouse name: Not on file   Number of children: 2   Years of education: Not on file   Highest education level: Not on file  Occupational History   Not on file  Tobacco  Use   Smoking status: Never   Smokeless tobacco: Never  Substance and Sexual Activity   Alcohol use: No   Drug use: No   Sexual activity: Not on file    Comment: BTL  Other Topics Concern   Not on file  Social History Narrative   Not on file   Social Drivers of Health   Financial Resource Strain: Not on file  Food Insecurity: No Food Insecurity (08/30/2021)   [facility]   Hunger Vital  Sign    Worried About Running Out of Food in the Last Year: Never true    Ran Out of Food in the Last Year: Never true  Transportation Needs: Not on file  Physical Activity: Not on file  Stress: Not on file  Social Connections: Unknown (12/10/2021)   Received from [facility], [facility]   Social Network    Social Network: Not on file   Allergies  Allergen Reactions   Other Dermatitis    Surgical glues Surgical glues   Butalbital-Acetaminophen     Did not like the effect   Trazodone And Nefazodone    Wound Dressing Adhesive     Other reaction(s): Rash:itching   Zolpidem     Ate in her sleep   Family History  Problem Relation Age of Onset   Hypertension Father    Diabetes Father    Heart disease Father    Cancer Father        prostate   Sickle cell anemia Daughter    Sickle cell anemia Son    Birth defects Maternal Grandfather        prostate cancer     Current Outpatient Medications (Cardiovascular):    spironolactone (ALDACTONE) 50 MG tablet, Take 50 mg by mouth daily.   Current Outpatient Medications (Respiratory):    albuterol (VENTOLIN HFA) 108 (90 Base) MCG/ACT inhaler, INHALE 2 PUFFS INTO THE LUNGS EVERY 6 HOURS AS NEEDED FOR WHEEZING OR SHORTNESS OF BREATH   ipratropium (ATROVENT) 0.06 % nasal spray, Place 2 sprays into both nostrils 2 (two) times daily.   promethazine (PHENERGAN) 25 MG tablet, TAKE 1 TABLET BY MOUTH EVERY 4 HOURS AS NEEDED FOR NAUSEA/VOMITING  Current Outpatient Medications (Analgesics):    acetaminophen (TYLENOL) 500 MG tablet, Take 1,000 mg by mouth daily.    Rimegepant Sulfate (NURTEC) 75 MG TBDP, TAKE 1 TABLET BY MOUTH ONCE EVERY OTHER DAY AS NEEDED   traMADol (ULTRAM) 50 MG tablet, Take 1 tablet (50 mg total) by mouth every 8 (eight) hours as needed for severe pain.   Current Outpatient Medications (Other):    betamethasone dipropionate 0.05 % cream, Apply 1 application topically daily as needed (Eczema).    clindamycin  (CLINDAGEL) 1 % gel, Apply 1 application topically daily as needed (acne).    doxycycline (VIBRAMYCIN) 50 MG capsule, SMARTSIG:1-2 Capsule(s) By Mouth Every Evening   Eszopiclone 3 MG TABS, Take 1 tablet (3 mg total) by mouth at bedtime. Take immediately before bedtime   hydrocortisone (ANUSOL-HC) 2.5 % rectal cream, Use bid (Patient taking differently: Place 1 application  rectally 2 (two) times daily as needed for hemorrhoids.)   LORazepam (ATIVAN) 1 MG tablet, TAKE 1 TO 2 TABLETS BY MOUTH AT BEDTIME AS NEEDED FOR SLEEP   Misc. Devices (ALL-BODY MASSAGE) MISC, Please provide patient with 15 minutes of gentle, comforting massage in the shoulder, neck, upper back region every evening 30 minutes prior to bedtime.  Repeat as often as needed.  Multiple Vitamin (MULTI-VITAMIN) tablet, Take 1 tablet by mouth daily.   ondansetron (ZOFRAN) 4 MG tablet, Take 1 tablet (4 mg total) by mouth every 8 (eight) hours as needed for nausea or vomiting.   phentermine (ADIPEX-P) 37.5 MG tablet, Take 1 tablet (37.5 mg total) by mouth daily before breakfast.   Semaglutide-Weight Management (WEGOVY) 0.25 MG/0.5ML SOAJ, Inject 0.25 mg into the skin once a week.   tiZANidine (ZANAFLEX) 4 MG tablet, Take 4 mg by mouth daily as needed (Back pain).    Vitamin D, Ergocalciferol, 50000 units CAPS, TAKE ONE CAPSULE BY MOUTH EVERY MONTH   Reviewed prior external information including notes and imaging from  primary care provider As well as notes that were available from care everywhere and other healthcare systems.  Past medical history, social, surgical and family history all reviewed in electronic medical record.  No pertanent information unless stated regarding to the chief complaint.   Review of Systems:  No headache, visual changes, nausea, vomiting, diarrhea, constipation, dizziness, abdominal pain, skin rash, fevers, chills, night sweats, weight loss, swollen lymph nodes, body aches, joint swelling, chest pain,  shortness of breath, mood changes. POSITIVE muscle aches  Objective  Blood pressure 98/64, pulse 78, height 5\' 6"  (1.676 m), weight 202 lb (91.6 kg), SpO2 99%.   General: No apparent distress alert and oriented x3 mood and affect normal, dressed appropriately.  HEENT: Pupils equal, extraocular movements intact  Respiratory: Patient's speak in full sentences and does not appear short of breath  Cardiovascular: No lower extremity edema, non tender, no erythema  Right shoulder exam shows patient does have some increasing in weakness noted.  Patient does have 3 out of 5 strength of the rotator cuff which is different than previous exam.   Limited muscular skeletal ultrasound was performed and interpreted by Antoine Primas, M  Limited ultrasound does show that there is acute changes noted to the supraspinatus tendon that does appear to be a potential acute tear but no significant retraction.  Increase in hypoechoic changes noted as well around the surrounding tendon.   Impression and Recommendations:      The above documentation has been reviewed and is accurate and complete Judi Saa, DO

## 2023-09-24 ENCOUNTER — Encounter: Payer: Self-pay | Admitting: Family Medicine

## 2023-09-24 ENCOUNTER — Ambulatory Visit: Payer: BC Managed Care – PPO | Admitting: Family Medicine

## 2023-09-24 ENCOUNTER — Other Ambulatory Visit: Payer: Self-pay

## 2023-09-24 VITALS — BP 98/64 | HR 78 | Ht 66.0 in | Wt 202.0 lb

## 2023-09-24 DIAGNOSIS — M25511 Pain in right shoulder: Secondary | ICD-10-CM | POA: Diagnosis not present

## 2023-09-24 DIAGNOSIS — G8929 Other chronic pain: Secondary | ICD-10-CM | POA: Diagnosis not present

## 2023-09-24 NOTE — Anesthesia Preprocedure Evaluation (Addendum)
 Anesthesia Evaluation  Patient identified by MRN, date of birth, ID band Patient awake    Reviewed: Allergy & Precautions, NPO status , Patient's Chart, lab work & pertinent test results  Airway Mallampati: II  TM Distance: >3 FB Neck ROM: Full    Dental   Pulmonary neg pulmonary ROS   breath sounds clear to auscultation       Cardiovascular negative cardio ROS  Rhythm:Regular Rate:Normal     Neuro/Psych  Headaches  negative psych ROS   GI/Hepatic Neg liver ROS,GERD  ,,  Endo/Other  negative endocrine ROS    Renal/GU negative Renal ROS  negative genitourinary   Musculoskeletal  (+) Arthritis , Osteoarthritis,    Abdominal   Peds  Hematology  (+) Blood dyscrasia, Sickle cell trait   Anesthesia Other Findings [id number] LD:   Reproductive/Obstetrics negative OB ROS                             Anesthesia Physical Anesthesia Plan  ASA: 2  Anesthesia Plan: General   Post-op Pain Management: Tylenol PO (pre-op)* and Toradol IV (intra-op)*   Induction: Intravenous  PONV Risk Score and Plan: 3 and Ondansetron, Dexamethasone, Midazolam and Treatment may vary due to age or medical condition  Airway Management Planned: LMA  Additional Equipment: None  Intra-op Plan:   Post-operative Plan: Extubation in OR  Informed Consent: I have reviewed the patients History and Physical, chart, labs and discussed the procedure including the risks, benefits and alternatives for the proposed anesthesia with the patient or authorized representative who has indicated his/her understanding and acceptance.     Dental advisory given  Plan Discussed with: CRNA  Anesthesia Plan Comments:        Anesthesia Quick Evaluation

## 2023-09-24 NOTE — Assessment & Plan Note (Signed)
Failing all conservative therapy.  Has done formal physical therapy, injections, icing regimen, do feel that at this point advanced imaging is necessary.  Patient is in agreement with the plan.  Follow-up with me again in 6 to 8 weeks otherwise we will discuss though after imaging to discuss if surgical intervention is necessary.  And is affecting all daily activities as well as waking her up at night

## 2023-09-24 NOTE — Patient Instructions (Signed)
MRI R shoulder We will be in touch

## 2023-09-28 ENCOUNTER — Ambulatory Visit: Payer: BC Managed Care – PPO

## 2023-09-28 DIAGNOSIS — M129 Arthropathy, unspecified: Secondary | ICD-10-CM | POA: Diagnosis not present

## 2023-09-28 DIAGNOSIS — M7581 Other shoulder lesions, right shoulder: Secondary | ICD-10-CM | POA: Diagnosis not present

## 2023-09-28 DIAGNOSIS — S43431A Superior glenoid labrum lesion of right shoulder, initial encounter: Secondary | ICD-10-CM | POA: Diagnosis not present

## 2023-09-28 DIAGNOSIS — G8929 Other chronic pain: Secondary | ICD-10-CM | POA: Diagnosis not present

## 2023-09-28 DIAGNOSIS — M25511 Pain in right shoulder: Secondary | ICD-10-CM | POA: Diagnosis not present

## 2023-10-01 ENCOUNTER — Encounter (HOSPITAL_BASED_OUTPATIENT_CLINIC_OR_DEPARTMENT_OTHER)
Admission: RE | Admit: 2023-10-01 | Discharge: 2023-10-01 | Disposition: A | Discharge status: Home / Self Care | Payer: BC Managed Care – PPO | Source: Ambulatory Visit | Attending: General Surgery

## 2023-10-01 ENCOUNTER — Encounter (HOSPITAL_BASED_OUTPATIENT_CLINIC_OR_DEPARTMENT_OTHER): Payer: Self-pay | Admitting: Surgery

## 2023-10-01 ENCOUNTER — Other Ambulatory Visit: Payer: Self-pay

## 2023-10-01 DIAGNOSIS — Z01812 Encounter for preprocedural laboratory examination: Secondary | ICD-10-CM | POA: Diagnosis not present

## 2023-10-01 DIAGNOSIS — Z79899 Other long term (current) drug therapy: Secondary | ICD-10-CM | POA: Insufficient documentation

## 2023-10-01 LAB — BASIC METABOLIC PANEL
Anion gap: 8 (ref 5–15)
BUN: 12 mg/dL (ref 6–20)
CO2: 23 mmol/L (ref 22–32)
Calcium: 9.3 mg/dL (ref 8.9–10.3)
Chloride: 108 mmol/L (ref 98–111)
Creatinine, Ser: 1.32 mg/dL — ABNORMAL HIGH (ref 0.44–1.00)
GFR, Estimated: 50 mL/min — ABNORMAL LOW (ref >60)
Glucose, Bld: 87 mg/dL (ref 70–99)
Potassium: 4.3 mmol/L (ref 3.5–5.1)
Sodium: 139 mmol/L (ref 135–145)

## 2023-10-01 MED ORDER — ENSURE PRE-SURGERY PO LIQD: 296.0000 mL | Freq: Once | ORAL | Status: DC

## 2023-10-01 MED ORDER — CHLORHEXIDINE GLUCONATE CLOTH 2 % EX PADS: 6.0000 | MEDICATED_PAD | Freq: Once | CUTANEOUS | Status: DC

## 2023-10-01 NOTE — Progress Notes (Signed)

## 2023-10-01 NOTE — Progress Notes (Signed)
   10/01/23 0454  PAT Phone Screen  Is the patient taking a GLP-1 receptor agonist? (S)  Yes (2/14 last dose will hold dose this friday)  Has the patient been informed on holding medication? (S)  Yes  Do You Have Diabetes? No  Do You Have Hypertension? No  Have You Ever Been to the ER for Asthma? No  Have You Taken Oral Steroids in the Past 3 Months? No  Do you Take Phenteramine or any Other Diet Drugs? No  Recent  Lab Work, EKG, CXR? No  Do you have a history of heart problems? No  Have you ever had tests on your heart? Yes  What cardiac tests were performed? Cardiac Cath;Stress Test  What date/year were cardiac tests completed? 2021 hx of abn stress test done for dyspnea- f/u cath clear to f/u prn. Pt denied any issues with sob or doe on pre op call.  Results viewable: [facility] [employer] Tab  Any Recent Hospitalizations? No  Height 5\' 9"  (1.753 m)  Weight 89.8 kg  [employer] (S)  Yes (BMET)

## 2023-10-02 ENCOUNTER — Encounter: Payer: Self-pay | Admitting: Internal Medicine

## 2023-10-06 NOTE — H&P (Signed)
 REFERRING PHYSICIAN: Henreitta Leber, PA PROVIDER: Wayne Both, MD MRN: [id number] DOB: 1976/02/21  Subjective   Chief Complaint: New Consultation  History of Present Illness: Angel [name] is a 48 y.o. female who is seen today as an office consultation for evaluation of New Consultation  This is a 48 year old female presents with a mass in her right axilla. She has had the mass for several years but is now getting larger and causing more discomfort. She has had a previous ultrasound showing the mass measured 3.5 cm. She underwent a biopsy showing fibrocystic changes with pash but no evidence of malignancy. She has pain almost every day now and feels like the mass has gotten larger. There is a family history of breast cancer in a maternal cousin. She has had benign cyst removed from her breast in the past  Review of Systems: A complete review of systems was obtained from the patient. I have reviewed this information and discussed as appropriate with the patient. See HPI as well for other ROS.  ROS   Medical History: Past Medical History:  Diagnosis Date  Arthritis   There is no problem list on file for this patient.  Past Surgical History:  Procedure Laterality Date  CYSTECTOMY 2014  Breast  REMOVAL OVARIAN CYST  SPINE SURGERY    Allergies  Allergen Reactions  Other Dermatitis  Surgical glues Surgical glues Surgical glues  Surgical glues Surgical glues Surgical glues Surgical glues Surgical glues Surgical glues  Surgical glues  Surgical glues Surgical glues Surgical glues Surgical glues Surgical glues Surgical glues  Surgical glues Surgical glues  Zolpidem Other (See Comments)  Ate in her sleep Ate in her sleep  Other reaction(s): Other Ate in her sleep Ate in her sleep Ate in her sleep Ate in her sleep  Ate in her sleep, Ate in her sleep, , Other reaction(s): Other, Ate in her sleep, Ate in her sleep, Ate in her sleep, Ate in her  sleep  Ate in her sleep  Ate in her sleep Ate in her sleep Ate in her sleep Ate in her sleep  Adhesive Tape-Silicones Dermatitis  Surgical glues  Butalbital-Acetaminophen Other (See Comments)  Did not like the effect  Wound Dressings Other (See Comments)  Other reaction(s): Rash:itching   Current Outpatient Medications on File Prior to Visit  Medication Sig Dispense Refill  ergocalciferol, vitamin D2, 1,250 mcg (50,000 unit) capsule TAKE ONE CAPSULE BY MOUTH EVERY MONTH  ibuprofen (MOTRIN) 800 MG tablet Take 800 mg by mouth at bedtime as needed  LORazepam (ATIVAN) 1 MG tablet Take 1-2 tablets by mouth at bedtime as needed  NURTEC ODT 75 mg disintegrating tablet TAKE 1 TABLET BY MOUTH ONCE EVERY OTHER DAY AS NEEDED  semaglutide (WEGOVY) 0.25 mg/0.5 mL pen injector Inject 0.25 mg subcutaneously  spironolactone (ALDACTONE) 50 MG tablet TAKE ONE (1) TABLET BY MOUTH EVERY DAY AFTER A MEAL  tiZANidine (ZANAFLEX) 4 MG tablet Take 4 mg by mouth   No current facility-administered medications on file prior to visit.   Family History  Problem Relation Age of Onset  Stroke Father  Obesity Father  High blood pressure (Hypertension) Father  Coronary Artery Disease (Blocked arteries around heart) Father  Diabetes Father  Deep vein thrombosis (DVT or abnormal blood clot formation) Father    Social History   Tobacco Use  Smoking Status Never  Smokeless Tobacco Never    Social History   Socioeconomic History  Marital status: Unknown  Tobacco Use  Smoking status: Never  Smokeless tobacco: Never  Substance and Sexual Activity  Alcohol use: Never  Drug use: Never   Social Drivers of Health   Food Insecurity: No Food Insecurity (08/30/2021)  Received [facility]  Hunger Vital Sign  Worried About Running Out of Food in the Last Year: Never true  Ran Out of Food in the Last Year: Never true  Received from [employer]  Social Network  Housing Stability: Unknown  (09/09/2023)  Housing Stability Vital Sign  Homeless in the Last Year: No   Objective:   Vitals:   BP: 118/60  Temp: 36.6 C (97.8 F)  Weight: 93.5 kg (206 lb 3.2 oz)  Height: 175.3 cm (5\' 9" )  PainSc: 6  PainLoc: Breast   Body mass index is 30.45 kg/m.  Physical Exam   She appears well on exam  There is a palpable mass in the right axilla which is on was 4 cm with 1 large underlying lymph node. There are no skin changes. The mass is nonpulsatile.  Labs, Imaging and Diagnostic Testing: I have reviewed her mammograms from late last year which were unremarkable  Assessment and Plan:   Diagnoses and all orders for this visit:  Axillary mass, right   I have discussed the mass with her. Given its increase in size, consistency with breast tissue, and discomfort, I believe surgical excision is warranted for complete removal of the mass for histologic evaluation trial malignancy. I discussed this with her in detail. We discussed the risks which includes but is not limited to bleeding, infection, injury to surrounding structures, seroma formation, the need for other procedures, the potential for finding malignancy, postoperative recovery, etc. When she had her last surgery on her back she developed a rash secondary to the Dermabond so we will try to avoid Dermabond and use just Steri-Strips instead. She understands agrees with the plans. Surgery will be scheduled

## 2023-10-07 ENCOUNTER — Encounter (HOSPITAL_BASED_OUTPATIENT_CLINIC_OR_DEPARTMENT_OTHER)
Admission: RE | Disposition: A | Discharge status: Home / Self Care | Payer: Self-pay | Source: Ambulatory Visit | Attending: Surgery

## 2023-10-07 ENCOUNTER — Other Ambulatory Visit: Payer: Self-pay

## 2023-10-07 ENCOUNTER — Ambulatory Visit (HOSPITAL_BASED_OUTPATIENT_CLINIC_OR_DEPARTMENT_OTHER)
Admission: RE | Admit: 2023-10-07 | Discharge: 2023-10-07 | Disposition: A | Discharge status: Home / Self Care | Payer: BC Managed Care – PPO | Source: Ambulatory Visit | Attending: Surgery | Admitting: Surgery

## 2023-10-07 ENCOUNTER — Encounter (HOSPITAL_BASED_OUTPATIENT_CLINIC_OR_DEPARTMENT_OTHER): Payer: Self-pay | Admitting: Surgery

## 2023-10-07 ENCOUNTER — Ambulatory Visit (HOSPITAL_BASED_OUTPATIENT_CLINIC_OR_DEPARTMENT_OTHER): Payer: BC Managed Care – PPO | Admitting: Anesthesiology

## 2023-10-07 DIAGNOSIS — M1990 Unspecified osteoarthritis, unspecified site: Secondary | ICD-10-CM | POA: Insufficient documentation

## 2023-10-07 DIAGNOSIS — Z01818 Encounter for other preprocedural examination: Secondary | ICD-10-CM

## 2023-10-07 DIAGNOSIS — R229 Localized swelling, mass and lump, unspecified: Secondary | ICD-10-CM | POA: Diagnosis not present

## 2023-10-07 DIAGNOSIS — D573 Sickle-cell trait: Secondary | ICD-10-CM | POA: Diagnosis not present

## 2023-10-07 DIAGNOSIS — Q831 Accessory breast: Secondary | ICD-10-CM | POA: Diagnosis not present

## 2023-10-07 DIAGNOSIS — R222 Localized swelling, mass and lump, trunk: Secondary | ICD-10-CM | POA: Insufficient documentation

## 2023-10-07 DIAGNOSIS — N6011 Diffuse cystic mastopathy of right breast: Secondary | ICD-10-CM | POA: Diagnosis not present

## 2023-10-07 DIAGNOSIS — Z79899 Other long term (current) drug therapy: Secondary | ICD-10-CM

## 2023-10-07 DIAGNOSIS — N6489 Other specified disorders of breast: Secondary | ICD-10-CM | POA: Diagnosis not present

## 2023-10-07 HISTORY — PX: MASS EXCISION: SHX2000

## 2023-10-07 LAB — POCT PREGNANCY, URINE: Preg Test, Ur: NEGATIVE

## 2023-10-07 SURGERY — EXCISION MASS
Anesthesia: General | Site: Axilla | Laterality: Right

## 2023-10-07 MED ORDER — ACETAMINOPHEN 500 MG PO TABS
ORAL_TABLET | ORAL | Status: AC
  Filled 2023-10-07: qty 2

## 2023-10-07 MED ORDER — ACETAMINOPHEN 500 MG PO TABS
1000.0000 mg | ORAL_TABLET | Freq: Once | ORAL | Status: AC
  Administered 2023-10-07: 1000 mg via ORAL

## 2023-10-07 MED ORDER — OXYCODONE HCL 5 MG PO TABS
ORAL_TABLET | ORAL | Status: AC
  Filled 2023-10-07: qty 1

## 2023-10-07 MED ORDER — LIDOCAINE HCL (CARDIAC) PF 100 MG/5ML IV SOSY
PREFILLED_SYRINGE | INTRAVENOUS | Status: DC | PRN
  Administered 2023-10-07: 40 mg via INTRAVENOUS

## 2023-10-07 MED ORDER — FENTANYL CITRATE (PF) 100 MCG/2ML IJ SOLN
INTRAMUSCULAR | Status: AC
  Filled 2023-10-07: qty 2

## 2023-10-07 MED ORDER — OXYCODONE HCL 5 MG/5ML PO SOLN: 5.0000 mg | Freq: Once | ORAL | Status: AC | PRN

## 2023-10-07 MED ORDER — MIDAZOLAM HCL 2 MG/2ML IJ SOLN
INTRAMUSCULAR | Status: DC | PRN
  Administered 2023-10-07: 1 mg via INTRAVENOUS

## 2023-10-07 MED ORDER — PHENYLEPHRINE HCL (PRESSORS) 10 MG/ML IV SOLN
INTRAVENOUS | Status: DC | PRN
  Administered 2023-10-07: 160 ug via INTRAVENOUS
  Administered 2023-10-07: 80 ug via INTRAVENOUS
  Administered 2023-10-07: 160 ug via INTRAVENOUS
  Administered 2023-10-07: 80 ug via INTRAVENOUS

## 2023-10-07 MED ORDER — DEXAMETHASONE SODIUM PHOSPHATE 4 MG/ML IJ SOLN
INTRAMUSCULAR | Status: DC | PRN
  Administered 2023-10-07: 8 mg via INTRAVENOUS

## 2023-10-07 MED ORDER — HYDROMORPHONE HCL 1 MG/ML IJ SOLN: 0.2500 mg | INTRAMUSCULAR | Status: DC | PRN

## 2023-10-07 MED ORDER — PROPOFOL 10 MG/ML IV BOLUS
INTRAVENOUS | Status: DC | PRN
  Administered 2023-10-07: 200 mg via INTRAVENOUS

## 2023-10-07 MED ORDER — OXYCODONE HCL 5 MG PO TABS
5.0000 mg | ORAL_TABLET | Freq: Once | ORAL | Status: AC | PRN
  Administered 2023-10-07: 5 mg via ORAL

## 2023-10-07 MED ORDER — BUPIVACAINE-EPINEPHRINE 0.5% -1:200000 IJ SOLN
INTRAMUSCULAR | Status: DC | PRN
  Administered 2023-10-07: 20 mL

## 2023-10-07 MED ORDER — PHENYLEPHRINE 80 MCG/ML (10ML) SYRINGE FOR IV PUSH (FOR BLOOD PRESSURE SUPPORT)
PREFILLED_SYRINGE | INTRAVENOUS | Status: AC
  Filled 2023-10-07: qty 20

## 2023-10-07 MED ORDER — ONDANSETRON HCL 4 MG/2ML IJ SOLN
INTRAMUSCULAR | Status: DC | PRN
  Administered 2023-10-07: 4 mg via INTRAVENOUS

## 2023-10-07 MED ORDER — 0.9 % SODIUM CHLORIDE (POUR BTL) OPTIME
TOPICAL | Status: DC | PRN
  Administered 2023-10-07: 500 mL

## 2023-10-07 MED ORDER — DEXMEDETOMIDINE HCL IN NACL 80 MCG/20ML IV SOLN
INTRAVENOUS | Status: DC | PRN
  Administered 2023-10-07: 4 ug via INTRAVENOUS

## 2023-10-07 MED ORDER — AMISULPRIDE (ANTIEMETIC) 5 MG/2ML IV SOLN: 10.0000 mg | Freq: Once | INTRAVENOUS | Status: DC | PRN

## 2023-10-07 MED ORDER — FENTANYL CITRATE (PF) 100 MCG/2ML IJ SOLN
INTRAMUSCULAR | Status: DC | PRN
Start: 2023-10-07 — End: 2023-10-07
  Administered 2023-10-07: 50 ug via INTRAVENOUS

## 2023-10-07 MED ORDER — TRAMADOL HCL 50 MG PO TABS: 50.0000 mg | ORAL_TABLET | Freq: Four times a day (QID) | ORAL | 0 refills | Status: DC | PRN

## 2023-10-07 MED ORDER — CEFAZOLIN SODIUM-DEXTROSE 2-4 GM/100ML-% IV SOLN
2.0000 g | INTRAVENOUS | Status: AC
Start: 2023-10-07 — End: 2023-10-07
  Administered 2023-10-07: 2 g via INTRAVENOUS

## 2023-10-07 MED ORDER — MIDAZOLAM HCL 2 MG/2ML IJ SOLN
INTRAMUSCULAR | Status: AC
  Filled 2023-10-07: qty 2

## 2023-10-07 MED ORDER — ONDANSETRON HCL 4 MG/2ML IJ SOLN: 4.0000 mg | Freq: Once | INTRAMUSCULAR | Status: DC | PRN

## 2023-10-07 MED ORDER — LACTATED RINGERS IV SOLN: INTRAVENOUS | Status: DC

## 2023-10-07 MED ORDER — CEFAZOLIN SODIUM-DEXTROSE 2-4 GM/100ML-% IV SOLN
INTRAVENOUS | Status: AC
  Filled 2023-10-07: qty 100

## 2023-10-07 SURGICAL SUPPLY — 35 items
BENZOIN TINCTURE PRP APPL 2/3 (GAUZE/BANDAGES/DRESSINGS) IMPLANT
BLADE CLIPPER SURG (BLADE) IMPLANT
BLADE SURG 15 STRL LF DISP TIS (BLADE) ×2 IMPLANT
BNDG ELASTIC 3INX 5YD STR LF (GAUZE/BANDAGES/DRESSINGS) IMPLANT
BNDG ELASTIC 4INX 5YD STR LF (GAUZE/BANDAGES/DRESSINGS) IMPLANT
CANISTER SUCT 1200ML W/VALVE (MISCELLANEOUS) IMPLANT
CHLORAPREP W/TINT 26 (MISCELLANEOUS) ×2 IMPLANT
COVER BACK TABLE 60X90IN (DRAPES) ×2 IMPLANT
COVER MAYO STAND STRL (DRAPES) ×2 IMPLANT
DERMABOND ADVANCED .7 DNX12 (GAUZE/BANDAGES/DRESSINGS) ×2 IMPLANT
DRAPE LAPAROTOMY 100X72 PEDS (DRAPES) ×2 IMPLANT
DRAPE UTILITY XL STRL (DRAPES) ×2 IMPLANT
ELECT REM PT RETURN 9FT ADLT (ELECTROSURGICAL) ×1
ELECTRODE REM PT RTRN 9FT ADLT (ELECTROSURGICAL) ×2 IMPLANT
GLOVE SURG SIGNA 7.5 PF LTX (GLOVE) ×2 IMPLANT
GOWN STRL REUS W/ TWL LRG LVL3 (GOWN DISPOSABLE) ×2 IMPLANT
GOWN STRL REUS W/ TWL XL LVL3 (GOWN DISPOSABLE) ×2 IMPLANT
NDL HYPO 25X1 1.5 SAFETY (NEEDLE) ×2 IMPLANT
NEEDLE HYPO 25X1 1.5 SAFETY (NEEDLE) ×1
NS IRRIG 1000ML POUR BTL (IV SOLUTION) IMPLANT
PACK BASIN DAY SURGERY FS (CUSTOM PROCEDURE TRAY) ×2 IMPLANT
PENCIL SMOKE EVACUATOR (MISCELLANEOUS) ×2 IMPLANT
SLEEVE SCD COMPRESS KNEE MED (STOCKING) IMPLANT
SPIKE FLUID TRANSFER (MISCELLANEOUS) IMPLANT
SPONGE T-LAP 4X18 ~~LOC~~+RFID (SPONGE) ×2 IMPLANT
STRIP CLOSURE SKIN 1/2X4 (GAUZE/BANDAGES/DRESSINGS) IMPLANT
SUT MNCRL AB 4-0 PS2 18 (SUTURE) IMPLANT
SUT VIC AB 2-0 SH 27XBRD (SUTURE) IMPLANT
SUT VIC AB 3-0 SH 27X BRD (SUTURE) IMPLANT
SYR 10ML LL (SYRINGE) IMPLANT
SYR BULB EAR ULCER 3OZ GRN STR (SYRINGE) IMPLANT
SYR CONTROL 10ML LL (SYRINGE) ×2 IMPLANT
TOWEL GREEN STERILE FF (TOWEL DISPOSABLE) ×2 IMPLANT
TUBE CONNECTING 20X1/4 (TUBING) IMPLANT
YANKAUER SUCT BULB TIP NO VENT (SUCTIONS) IMPLANT

## 2023-10-07 NOTE — Interval H&P Note (Signed)
 History and Physical Interval Note:no change in H and P  10/07/2023 7:09 AM  Angel [name]  has presented today for surgery, with the diagnosis of RIGHT AXILLARY MASS.  The various methods of treatment have been discussed with the patient and family. After consideration of risks, benefits and other options for treatment, the patient has consented to  Procedure(s): EXCISION RIGHT AXILLARY  MASS (Right) as a surgical intervention.  The patient's history has been reviewed, patient examined, no change in status, stable for surgery.  I have reviewed the patient's chart and labs.  Questions were answered to the patient's satisfaction.     Abigail Miyamoto

## 2023-10-07 NOTE — Anesthesia Postprocedure Evaluation (Signed)
 Anesthesia Post Note  Patient: Angel [name]  Procedure(s) Performed: EXCISION RIGHT AXILLARY  MASS (Right: Axilla)     Patient location during evaluation: PACU Anesthesia Type: General Level of consciousness: awake and alert Pain management: pain level controlled Vital Signs Assessment: post-procedure vital signs reviewed and stable Respiratory status: spontaneous breathing, nonlabored ventilation, respiratory function stable and patient connected to nasal cannula oxygen Cardiovascular status: blood pressure returned to baseline and stable Postop Assessment: no apparent nausea or vomiting Anesthetic complications: no  No notable events documented.  Last Vitals:  Vitals:   10/07/23 0915 10/07/23 0946  BP: (!) 92/55 (!) 109/42  Pulse: 67 67  Resp: 13   Temp:  (!) 36.2 C  SpO2: 100% 100%    Last Pain:  Vitals:   10/07/23 0946  TempSrc: Temporal  PainSc: 5                  Kennieth Rad

## 2023-10-07 NOTE — Discharge Instructions (Addendum)
 You may remove the bandage and start showering tomorrow but leave the Steri-Strips in place  No soaking in a tub for 1 week  You may use ice pack, Tylenol, and ibuprofen also for pain   No vigorous activity or work for 1 week     To whom it may concern:  Angel [name] underwent surgery on 10/07/2023.  She may return to work on 10/14/2023.  Abigail Miyamoto, MD [facility]   Post Anesthesia Home Care Instructions  Activity: Get plenty of rest for the remainder of the day. A responsible individual must stay with you for 24 hours following the procedure.  For the next 24 hours, DO NOT: -Drive a car -Advertising copywriter -Drink alcoholic beverages -Take any medication unless instructed by your physician -Make any legal decisions or sign important papers.  Meals: Start with liquid foods such as gelatin or soup. Progress to regular foods as tolerated. Avoid greasy, spicy, heavy foods. If nausea and/or vomiting occur, drink only clear liquids until the nausea and/or vomiting subsides. Call your physician if vomiting continues.  Special Instructions/Symptoms: Your throat may feel dry or sore from the anesthesia or the breathing tube placed in your throat during surgery. If this causes discomfort, gargle with warm salt water. The discomfort should disappear within 24 hours.  If you had a scopolamine patch placed behind your ear for the management of post- operative nausea and/or vomiting:  1. The medication in the patch is effective for 72 hours, after which it should be removed.  Wrap patch in a tissue and discard in the trash. Wash hands thoroughly with soap and water. 2. You may remove the patch earlier than 72 hours if you experience unpleasant side effects which may include dry mouth, dizziness or visual disturbances. 3. Avoid touching the patch. Wash your hands with soap and water after contact with the patch.    Post Anesthesia Home Care Instructions  Activity: Get  plenty of rest for the remainder of the day. A responsible individual must stay with you for 24 hours following the procedure.  For the next 24 hours, DO NOT: -Drive a car -Advertising copywriter -Drink alcoholic beverages -Take any medication unless instructed by your physician -Make any legal decisions or sign important papers.  Meals: Start with liquid foods such as gelatin or soup. Progress to regular foods as tolerated. Avoid greasy, spicy, heavy foods. If nausea and/or vomiting occur, drink only clear liquids until the nausea and/or vomiting subsides. Call your physician if vomiting continues.  Special Instructions/Symptoms: Your throat may feel dry or sore from the anesthesia or the breathing tube placed in your throat during surgery. If this causes discomfort, gargle with warm salt water. The discomfort should disappear within 24 hours.  Next dose of Tylenol can be taken at 1pm if needed.

## 2023-10-07 NOTE — Anesthesia Procedure Notes (Signed)
 Procedure Name: LMA Insertion Date/Time: 10/07/2023 8:20 AM  Performed by: Earmon Phoenix, CRNAPre-anesthesia Checklist: Patient identified, Emergency Drugs available, Suction available, Patient being monitored and Timeout performed Patient Re-evaluated:Patient Re-evaluated prior to induction Oxygen Delivery Method: Circle system utilized Preoxygenation: Pre-oxygenation with 100% oxygen Induction Type: IV induction Ventilation: Mask ventilation without difficulty LMA: LMA inserted LMA Size: 4.0 Number of attempts: 1 Placement Confirmation: positive ETCO2 and breath sounds checked- equal and bilateral Tube secured with: Tape Dental Injury: Teeth and Oropharynx as per pre-operative assessment

## 2023-10-07 NOTE — Transfer of Care (Signed)
 Immediate Anesthesia Transfer of Care Note  Patient: Angel [name]  Procedure(s) Performed: EXCISION RIGHT AXILLARY  MASS (Right: Axilla)  Patient Location: PACU  Anesthesia Type:General  Level of Consciousness: awake and patient cooperative  Airway & Oxygen Therapy: Patient Spontanous Breathing  Post-op Assessment: Report given to RN and Post -op Vital signs reviewed and stable  Post vital signs: Reviewed and stable  Last Vitals:  Vitals Value Taken Time  BP 110/83 10/07/23 0903  Temp    Pulse 77 10/07/23 0905  Resp 17 10/07/23 0905  SpO2 99 % 10/07/23 0905  Vitals shown include unfiled device data.  Last Pain:  Vitals:   10/07/23 0658  TempSrc: Temporal  PainSc: 4          Complications: No notable events documented.

## 2023-10-07 NOTE — Op Note (Signed)
 EXCISION RIGHT AXILLARY  MASS  Procedure Note  DANTE COOTER 10/07/2023   Pre-op Diagnosis: RIGHT AXILLARY MASS (4 cm )     Post-op Diagnosis: samd  Procedure(s): EXCISION 4 CM RIGHT AXILLARY  MASS  Surgeon(s): Abigail Miyamoto, MD  Anesthesia: General  Staff:  Circulator: Griffin Basil, RN; McDonough-Hughes, Maceo Pro, RN Scrub Person: Rolla Etienne  Estimated Blood Loss: Minimal               Specimens: sent to path  Indications: This is a 48 year old female who presented with a mass in her right axilla.  It measured and was 4 cm on physical examination.  She underwent a biopsy of the mass showing fibrocystic changes and PASH but no evidence of malignancy.  Because of the discomfort decision was made to proceed with removal in the operating room  Findings: The mass appeared consistent with very fibrous breast tissue.  Once the mass was removed I could feel no underlying lymphadenopathy  Procedure: The patient was brought to the operating identifies the correct patient.  She is placed upon the operating table and general anesthesia was induced.  Her right axilla was then prepped and draped in usual sterile fashion.  I anesthetized the skin over the palpable mass in the axilla with Marcaine.  I then made an elliptical incision to include the overlying skin with a scalpel.  I then dissected down into the subcutaneous tissue circumferentially with electrocautery.  She had very fibrous tissue and there was some scarring secondary to her biopsy.  The tissue appeared consistent with this history of breast tissue.  I excised it in its entirety with electrocautery.  It was slightly more than 4 cm in size.  It was sent to pathology for evaluation.  I palpated further into the axilla and can find no enlarged lymph nodes.  I achieved hemostasis with the cautery.  I then closed the subcutaneous tissue with interrupted 3-0 Vicryl sutures and closed the skin with a running 4-0 Monocryl.   Steri-Strips were then applied along with gauze and tape.  The patient tolerated the procedure well.  All the counts were correct at the end of the procedure.  She was then extubated in the operating room and taken in a stable condition to the recovery room.          Abigail Miyamoto   Date: 10/07/2023  Time: 9:00 AM

## 2023-10-08 ENCOUNTER — Encounter (HOSPITAL_BASED_OUTPATIENT_CLINIC_OR_DEPARTMENT_OTHER): Payer: Self-pay | Admitting: Surgery

## 2023-10-08 ENCOUNTER — Encounter: Payer: Self-pay | Admitting: Family Medicine

## 2023-10-08 LAB — SURGICAL PATHOLOGY

## 2023-10-15 DIAGNOSIS — L732 Hidradenitis suppurativa: Secondary | ICD-10-CM | POA: Diagnosis not present

## 2023-10-15 DIAGNOSIS — L700 Acne vulgaris: Secondary | ICD-10-CM | POA: Diagnosis not present

## 2023-10-30 ENCOUNTER — Encounter: Payer: Self-pay | Admitting: Cardiology

## 2023-10-30 NOTE — Progress Notes (Unsigned)
 [facility] [address] [place] [address] Phone: [phone] Subjective:   Angel [name], am serving as a scribe for Dr. Antoine Primas.  I'm seeing this patient by the request  of:  Plotnikov, Georgina Quint, MD  CC:  right shoulder pain   [id number]  09/24/2023 Failing all conservative therapy.  Has done formal physical therapy, injections, icing regimen, do feel that at this point advanced imaging is necessary.  Patient is in agreement with the plan.  Follow-up with me again in 6 to 8 weeks otherwise we will discuss though after imaging to discuss if surgical intervention is necessary.  And is affecting all daily activities as well as waking her up at night     Updated 10/31/2023 Angel [name] is a 48 y.o. female coming in with complaint of R shoulder pain. Patient said that she has been doing ok after having a surgery in the R underarm. Pain was less while on medication prescribed post surgery.   MRI R shoulder 09/28/2023 IMPRESSION: 1. Moderate supraspinatus tendinosis with a small insertional interstitial tear. 2. Mild infraspinatus tendinosis with a small interstitial tear towards the insertion. 3. Mild subscapularis tendinosis. 4. Mild subacromial/subdeltoid bursitis.     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 Compliance was encouraged.     Past Surgical History:  Procedure Laterality Date   BILATERAL SALPINGECTOMY  09/26/2015   Procedure: BILATERAL SALPINGECTOMY;  Surgeon: Osborn Coho, MD;  Location: [facility];  Service: Gynecology;;   BREAST BIOPSY Bilateral 07/13/2013   Procedure: BILATERAL EXCISION BREAS MASSES;  Surgeon: Shelly Rubenstein, MD;  Location: [facility];  Service: General;  Laterality: Bilateral;   BREAST CYST EXCISION Left 2014   4 cysts removed   DE QUERVAIN'S RELEASE Right 09/06/2003   DILITATION & CURRETTAGE/HYSTROSCOPY WITH NOVASURE ABLATION N/A 09/26/2015   Procedure: DILATATION & CURETTAGE/HYSTEROSCOPY WITH NOVASURE ABLATION;  Surgeon: Osborn Coho, MD;  Location: [facility];  Service: Gynecology;  Laterality: N/A;   LAPAROSCOPIC OVARIAN CYSTECTOMY Right 09/26/2015   Procedure: LAPAROSCOPIC Right OVARIAN CYSTECTOMY, Bilateral Peritoneal Biopsies over Uteralsacral area;  Surgeon: Osborn Coho, MD;  Location: [facility];  Service: Gynecology;  Laterality: Right;   LEFT HEART CATH AND CORONARY ANGIOGRAPHY N/A 12/30/2019   Procedure: LEFT HEART CATH AND CORONARY ANGIOGRAPHY;  Surgeon: Marykay Lex, MD;  Location: [facility];  Service: Cardiovascular;  Laterality:  N/A;   MASS EXCISION Right 10/07/2023   Procedure: EXCISION RIGHT AXILLARY  MASS;  Surgeon: Abigail Miyamoto, MD;  Location: [facility];  Service: General;  Laterality: Right;   SPINAL FUSION  07/05/2018   TUBAL LIGATION  12/27/2005   WISDOM TOOTH EXTRACTION      WRIST GANGLION EXCISION Right 09/06/2003   WRIST GANGLION EXCISION Left 09/04/1999   Social History   Socioeconomic History   Marital status: Married    Spouse name: Not on file   Number of children: 2   Years of education: Not on file   Highest education level: Not on file  Occupational History   Not on file  Tobacco Use   Smoking status: Never   Smokeless tobacco: Never  Vaping Use   Vaping status: Never Used  Substance and Sexual Activity   Alcohol use: No   Drug use: No   Sexual activity: Not on file    Comment: BTL  Other Topics Concern   Not on file  Social History Narrative   Not on file   Social Drivers of Health   Financial Resource Strain: Not on file  Food Insecurity: No Food Insecurity (08/30/2021)   [facility], [facility]   Hunger Vital Sign    Worried About Running Out of Food in the Last Year: Never true    Ran Out of Food in the Last Year: Never true  Transportation Needs: Not on file  Physical Activity: Not on file  Stress: Not on file  Social Connections: Unknown (12/10/2021)   [facility] from [facility], [facility]   Social Network    Social Network: Not on file   Allergies  Allergen Reactions   Other Dermatitis    Surgical glues Surgical glues   Butalbital-Acetaminophen     Did not like the effect   Lunesta [Eszopiclone]     Bad taste    Trazodone And Nefazodone    Wound Dressing Adhesive     Other reaction(s): Rash:itching   Wound Dressings Other (See Comments)    Other reaction(s): Rash:itching   Zolpidem     Ate in her sleep   Family History  Problem Relation Age of Onset   Hypertension Father    Diabetes Father    Heart disease Father    Cancer Father        prostate   Sickle cell anemia Daughter    Sickle cell anemia Son    Birth defects Maternal Grandfather        prostate cancer     Current Outpatient Medications (Cardiovascular):    spironolactone (ALDACTONE) 50 MG tablet, Take 50 mg by  mouth daily.  Current Outpatient Medications (Respiratory):    albuterol (VENTOLIN HFA) 108 (90 Base) MCG/ACT inhaler, INHALE 2 PUFFS INTO THE LUNGS EVERY 6 HOURS AS NEEDED FOR WHEEZING OR SHORTNESS OF BREATH   ipratropium (ATROVENT) 0.06 % nasal spray, Place 2 sprays into both nostrils 2 (two) times daily.   promethazine (PHENERGAN) 25 MG tablet, TAKE 1 TABLET BY MOUTH EVERY 4 HOURS AS NEEDED FOR NAUSEA/VOMITING  Current Outpatient Medications (Analgesics):    acetaminophen (TYLENOL) 500 MG tablet, Take 1,000 mg by mouth daily.    ibuprofen (ADVIL) 800 MG tablet, Take 800 mg by mouth.   Rimegepant Sulfate (NURTEC) 75 MG TBDP, TAKE 1 TABLET BY MOUTH ONCE EVERY OTHER DAY AS NEEDED   traMADol (ULTRAM) 50 MG tablet, Take 1 tablet (50 mg total) by mouth every 6 (six) hours  as needed for severe pain (pain score 7-10) or moderate pain (pain score 4-6).   Current Outpatient Medications (Other):    betamethasone dipropionate 0.05 % cream, Apply 1 application topically daily as needed (Eczema).    clindamycin (CLINDAGEL) 1 % gel, Apply 1 application topically daily as needed (acne).    doxycycline (VIBRAMYCIN) 50 MG capsule, SMARTSIG:1-2 Capsule(s) By Mouth Every Evening   hydrocortisone (ANUSOL-HC) 2.5 % rectal cream, Use bid (Patient taking differently: Place 1 application  rectally 2 (two) times daily as needed for hemorrhoids.)   LORazepam (ATIVAN) 1 MG tablet, Take 1-2 tablets (1-2 mg total) by mouth at bedtime as needed. for sleep   Multiple Vitamin (MULTI-VITAMIN) tablet, Take 1 tablet by mouth daily.   ondansetron (ZOFRAN) 4 MG tablet, Take 1 tablet (4 mg total) by mouth every 8 (eight) hours as needed for nausea or vomiting.   phentermine (ADIPEX-P) 37.5 MG tablet, Take 1 tablet (37.5 mg total) by mouth daily before breakfast.   Semaglutide-Weight Management (WEGOVY) 0.25 MG/0.5ML SOAJ, Inject 0.25 mg into the skin once a week.   tiZANidine (ZANAFLEX) 4 MG tablet, Take 4 mg by mouth daily as  needed (Back pain).    tretinoin (RETIN-A) 0.025 % cream, Apply topically at bedtime.   triamcinolone ointment (KENALOG) 0.1 %, Apply 1 Application topically 2 (two) times daily.   Vitamin D, Ergocalciferol, 50000 units CAPS, TAKE ONE CAPSULE BY MOUTH EVERY MONTH   Reviewed prior external information including notes and imaging from  primary care provider As well as notes that were available from care everywhere and other healthcare systems.  Past medical history, social, surgical and family history all reviewed in electronic medical record.  No pertanent information unless stated regarding to the chief complaint.   Review of Systems:  No headache, visual changes, nausea, vomiting, diarrhea, constipation, dizziness, abdominal pain, skin rash, fevers, chills, night sweats, weight loss, swollen lymph nodes, body aches, joint swelling, chest pain, shortness of breath, mood changes. POSITIVE muscle aches  Objective  Blood pressure 118/62, pulse 85, height 5\' 9"  (1.753 m), SpO2 99%.   General: No apparent distress alert and oriented x3 mood and affect normal, dressed appropriately.  HEENT: Pupils equal, extraocular movements intact  Respiratory: Patient's speak in full sentences and does not appear short of breath  Cardiovascular: No lower extremity edema, non tender, no erythema  Right shoulder exam shows patient does have multiple trigger points noted in the right shoulder region this includes the trapezius, rhomboid, and levator scapula.  After verbal consent patient was prepped with alcohol swab and with a 25-gauge half inch needle injected with a total of 3 cc of 0.5% Marcaine and 1 cc of Kenalog 40 mg/mL in the trapezius, rhomboid, and levator scapula trigger points.  3 of them.  Minimal blood loss.  Band-Aid placed.  Postinjection instructions given.   Impression and Recommendations:    The above documentation has been reviewed and is accurate and complete Judi Saa, DO

## 2023-10-31 ENCOUNTER — Encounter: Payer: Self-pay | Admitting: Internal Medicine

## 2023-10-31 ENCOUNTER — Ambulatory Visit: Payer: BC Managed Care – PPO | Admitting: Internal Medicine

## 2023-10-31 ENCOUNTER — Other Ambulatory Visit: Payer: Self-pay

## 2023-10-31 ENCOUNTER — Ambulatory Visit: Payer: BC Managed Care – PPO | Admitting: Family Medicine

## 2023-10-31 ENCOUNTER — Encounter: Payer: Self-pay | Admitting: Family Medicine

## 2023-10-31 VITALS — BP 118/62 | HR 85 | Ht 69.0 in

## 2023-10-31 VITALS — BP 122/70 | HR 84 | Temp 98.7°F | Ht 69.0 in | Wt 196.0 lb

## 2023-10-31 DIAGNOSIS — E538 Deficiency of other specified B group vitamins: Secondary | ICD-10-CM

## 2023-10-31 DIAGNOSIS — E663 Overweight: Secondary | ICD-10-CM | POA: Diagnosis not present

## 2023-10-31 DIAGNOSIS — R739 Hyperglycemia, unspecified: Secondary | ICD-10-CM | POA: Diagnosis not present

## 2023-10-31 DIAGNOSIS — M25511 Pain in right shoulder: Secondary | ICD-10-CM | POA: Diagnosis not present

## 2023-10-31 DIAGNOSIS — G43009 Migraine without aura, not intractable, without status migrainosus: Secondary | ICD-10-CM

## 2023-10-31 DIAGNOSIS — E559 Vitamin D deficiency, unspecified: Secondary | ICD-10-CM

## 2023-10-31 DIAGNOSIS — Z0000 Encounter for general adult medical examination without abnormal findings: Secondary | ICD-10-CM

## 2023-10-31 LAB — URINALYSIS
Bilirubin Urine: NEGATIVE
Hgb urine dipstick: NEGATIVE
Ketones, ur: NEGATIVE
Leukocytes,Ua: NEGATIVE
Nitrite: NEGATIVE
Specific Gravity, Urine: 1.01 (ref 1.000–1.030)
Total Protein, Urine: NEGATIVE
Urine Glucose: NEGATIVE
Urobilinogen, UA: 0.2 (ref 0.0–1.0)
pH: 6 (ref 5.0–8.0)

## 2023-10-31 LAB — COMPREHENSIVE METABOLIC PANEL WITH GFR
ALT: 10 U/L (ref 0–35)
AST: 12 U/L (ref 0–37)
Albumin: 4.3 g/dL (ref 3.5–5.2)
Alkaline Phosphatase: 47 U/L (ref 39–117)
BUN: 13 mg/dL (ref 6–23)
CO2: 28 meq/L (ref 19–32)
Calcium: 9.5 mg/dL (ref 8.4–10.5)
Chloride: 106 meq/L (ref 96–112)
Creatinine, Ser: 0.86 mg/dL (ref 0.40–1.20)
GFR: 80.4 mL/min (ref >60.00)
Glucose, Bld: 95 mg/dL (ref 70–99)
Potassium: 4.2 meq/L (ref 3.5–5.1)
Sodium: 139 meq/L (ref 135–145)
Total Bilirubin: 0.4 mg/dL (ref 0.2–1.2)
Total Protein: 7 g/dL (ref 6.0–8.3)

## 2023-10-31 LAB — CBC WITH DIFFERENTIAL/PLATELET
Basophils Absolute: 0 K/uL (ref 0.0–0.1)
Basophils Relative: 0.7 % (ref 0.0–3.0)
Eosinophils Absolute: 0.1 K/uL (ref 0.0–0.7)
Eosinophils Relative: 1.4 % (ref 0.0–5.0)
HCT: 39.1 % (ref 36.0–46.0)
Hemoglobin: 13 g/dL (ref 12.0–15.0)
Lymphocytes Relative: 31.6 % (ref 12.0–46.0)
Lymphs Abs: 1.4 K/uL (ref 0.7–4.0)
MCHC: 33.1 g/dL (ref 30.0–36.0)
MCV: 90.1 fl (ref 78.0–100.0)
Monocytes Absolute: 0.4 K/uL (ref 0.1–1.0)
Monocytes Relative: 8.8 % (ref 3.0–12.0)
Neutro Abs: 2.5 K/uL (ref 1.4–7.7)
Neutrophils Relative %: 57.5 % (ref 43.0–77.0)
Platelets: 293 K/uL (ref 150.0–400.0)
RBC: 4.34 Mil/uL (ref 3.87–5.11)
RDW: 14.4 % (ref 11.5–15.5)
WBC: 4.3 K/uL (ref 4.0–10.5)

## 2023-10-31 LAB — LIPID PANEL
Cholesterol: 200 mg/dL (ref 0–200)
HDL: 73.3 mg/dL (ref >39.00)
LDL Cholesterol: 115 mg/dL — ABNORMAL HIGH (ref 0–99)
NonHDL: 127.01
Total CHOL/HDL Ratio: 3
Triglycerides: 60 mg/dL (ref 0.0–149.0)
VLDL: 12 mg/dL (ref 0.0–40.0)

## 2023-10-31 LAB — VITAMIN B12: Vitamin B-12: 282 pg/mL (ref 211–911)

## 2023-10-31 LAB — TSH: TSH: 1.72 u[IU]/mL (ref 0.35–5.50)

## 2023-10-31 LAB — VITAMIN D 25 HYDROXY (VIT D DEFICIENCY, FRACTURES): VITD: 23.59 ng/mL — ABNORMAL LOW (ref 30.00–100.00)

## 2023-10-31 LAB — HEMOGLOBIN A1C: Hgb A1c MFr Bld: 5.8 % (ref 4.6–6.5)

## 2023-10-31 MED ORDER — PHENTERMINE HCL 37.5 MG PO TABS: 37.5000 mg | ORAL_TABLET | Freq: Every day | ORAL | 2 refills | Status: AC

## 2023-10-31 MED ORDER — VITAMIN D (ERGOCALCIFEROL) 50000 UNITS PO CAPS: ORAL_CAPSULE | ORAL | 3 refills | Status: DC

## 2023-10-31 MED ORDER — LORAZEPAM 1 MG PO TABS
1.0000 mg | ORAL_TABLET | Freq: Every evening | ORAL | 3 refills | Status: DC | PRN
Start: 2023-10-31 — End: 2024-04-28

## 2023-10-31 MED ORDER — NURTEC 75 MG PO TBDP: ORAL_TABLET | ORAL | 11 refills | Status: DC

## 2023-10-31 NOTE — Patient Instructions (Addendum)
 Trigger point injections today See me in 2-3 months

## 2023-10-31 NOTE — Assessment & Plan Note (Signed)
 Trigger point injections given today.  Discussed icing regimen and home exercises hopefully this will be significantly beneficial.  Does have the partial tearing noted.  Discussed icing regimen and home exercises.  Follow-up again in 6 to 8 weeks

## 2023-10-31 NOTE — Assessment & Plan Note (Addendum)
 Angel [name] was on compound Wegovy - lost wt, off the Rx - her compound  pharmacy can't get it any longer BMI 28.9 Will start phentermine  Potential benefits of a phentermine use as well as potential risks  and complications were explained to the patient and were aknowledged.

## 2023-10-31 NOTE — Assessment & Plan Note (Signed)
 Continue with vitamin D

## 2023-10-31 NOTE — Assessment & Plan Note (Signed)
Ibuprofen prn 

## 2023-10-31 NOTE — Progress Notes (Signed)
 Subjective:  Patient ID: Angel [name], female    DOB: 08-Jun-1976  Age: 48 y.o. MRN: [id number]  CC: Medical Management of Chronic Issues (Discuss weight loss medication )   HPI JAEDA BRUSO presents for obesity, anxiety Evyn was on compound Wegovy - lost wt, off the Rx - her compound  pharmacy can't get it any longer  Outpatient Medications Prior to Visit  Medication Sig Dispense Refill   acetaminophen (TYLENOL) 500 MG tablet Take 1,000 mg by mouth daily.      albuterol (VENTOLIN HFA) 108 (90 Base) MCG/ACT inhaler INHALE 2 PUFFS INTO THE LUNGS EVERY 6 HOURS AS NEEDED FOR WHEEZING OR SHORTNESS OF BREATH 6.7 g 3   betamethasone dipropionate 0.05 % cream Apply 1 application topically daily as needed (Eczema).      clindamycin (CLINDAGEL) 1 % gel Apply 1 application topically daily as needed (acne).   11   doxycycline (VIBRAMYCIN) 50 MG capsule SMARTSIG:1-2 Capsule(s) By Mouth Every Evening     hydrocortisone (ANUSOL-HC) 2.5 % rectal cream Use bid (Patient taking differently: Place 1 application  rectally 2 (two) times daily as needed for hemorrhoids.) 30 g 1   ibuprofen (ADVIL) 800 MG tablet Take 800 mg by mouth.     ipratropium (ATROVENT) 0.06 % nasal spray Place 2 sprays into both nostrils 2 (two) times daily.     Multiple Vitamin (MULTI-VITAMIN) tablet Take 1 tablet by mouth daily.     ondansetron (ZOFRAN) 4 MG tablet Take 1 tablet (4 mg total) by mouth every 8 (eight) hours as needed for nausea or vomiting. 20 tablet 0   promethazine (PHENERGAN) 25 MG tablet TAKE 1 TABLET BY MOUTH EVERY 4 HOURS AS NEEDED FOR NAUSEA/VOMITING 30 tablet 3   Semaglutide-Weight Management (WEGOVY) 0.25 MG/0.5ML SOAJ Inject 0.25 mg into the skin once a week. 2 mL 2   spironolactone (ALDACTONE) 50 MG tablet Take 50 mg by mouth daily.     tiZANidine (ZANAFLEX) 4 MG tablet Take 4 mg by mouth daily as needed (Back pain).      traMADol (ULTRAM) 50 MG tablet Take 1 tablet (50 mg total) by mouth every 6  (six) hours as needed for severe pain (pain score 7-10) or moderate pain (pain score 4-6). 25 tablet 0   tretinoin (RETIN-A) 0.025 % cream Apply topically at bedtime.     triamcinolone ointment (KENALOG) 0.1 % Apply 1 Application topically 2 (two) times daily.     LORazepam (ATIVAN) 1 MG tablet TAKE 1 TO 2 TABLETS BY MOUTH AT BEDTIME AS NEEDED FOR SLEEP 60 tablet 3   Rimegepant Sulfate (NURTEC) 75 MG TBDP TAKE 1 TABLET BY MOUTH ONCE EVERY OTHER DAY AS NEEDED 8 tablet 11   Vitamin D, Ergocalciferol, 50000 units CAPS TAKE ONE CAPSULE BY MOUTH EVERY MONTH 3 capsule 3   No facility-administered medications prior to visit.    ROS: Review of Systems  Objective:  BP 122/70   Pulse 84   Temp 98.7 F (37.1 C) (Oral)   Ht 5\' 9"  (1.753 m)   Wt 196 lb (88.9 kg)   SpO2 96%   BMI 28.94 kg/m   BP Readings from Last 3 Encounters:  10/31/23 122/70  10/07/23 (!) 109/42  09/24/23 98/64    Wt Readings from Last 3 Encounters:  10/31/23 196 lb (88.9 kg)  10/07/23 200 lb 6.4 oz (90.9 kg)  09/24/23 202 lb (91.6 kg)    Physical Exam  Lab Results  Component Value Date   WBC  4.3 05/22/2023   HGB 12.9 05/22/2023   HCT 39.4 05/22/2023   PLT 291.0 05/22/2023   GLUCOSE 87 10/01/2023   CHOL 188 09/20/2023   TRIG 57 09/20/2023   HDL 69 09/20/2023   LDLCALC 108 (H) 09/20/2023   ALT 11 05/22/2023   AST 14 05/22/2023   NA 139 10/01/2023   K 4.3 10/01/2023   CL 108 10/01/2023   CREATININE 1.32 (H) 10/01/2023   BUN 12 10/01/2023   CO2 23 10/01/2023   TSH 2.22 11/21/2022   HGBA1C 5.6 10/18/2014    No results found.  Assessment & Plan:   Problem List Items Addressed This Visit     Vitamin D deficiency   Continue with vitamin D      Relevant Orders   VITAMIN D 25 Hydroxy (Vit-D Deficiency, Fractures)   RESOLVED: Migraine without aura - Primary   Ibuprofen prn      Relevant Medications   ibuprofen (ADVIL) 800 MG tablet   Rimegepant Sulfate (NURTEC) 75 MG TBDP   Overweight    Mitsuye was on compound Wegovy - lost wt, off the Rx - her compound  pharmacy can't get it any longer BMI 28.9 Will start phentermine  Potential benefits of a phentermine use as well as potential risks  and complications were explained to the patient and were aknowledged.       Well adult exam   Relevant Orders   TSH   Urinalysis   CBC with Differential/Platelet   Lipid panel   Comprehensive metabolic panel   Vitamin B12   VITAMIN D 25 Hydroxy (Vit-D Deficiency, Fractures)   Other Visit Diagnoses       Low serum vitamin B12       Relevant Orders   Vitamin B12   Hemoglobin A1c     Hyperglycemia             Meds ordered this encounter  Medications   LORazepam (ATIVAN) 1 MG tablet    Sig: Take 1-2 tablets (1-2 mg total) by mouth at bedtime as needed. for sleep    Dispense:  60 tablet    Refill:  3   Rimegepant Sulfate (NURTEC) 75 MG TBDP    Sig: TAKE 1 TABLET BY MOUTH ONCE EVERY OTHER DAY AS NEEDED    Dispense:  8 tablet    Refill:  11   Vitamin D, Ergocalciferol, 50000 units CAPS    Sig: TAKE ONE CAPSULE BY MOUTH EVERY MONTH    Dispense:  3 capsule    Refill:  3   phentermine (ADIPEX-P) 37.5 MG tablet    Sig: Take 1 tablet (37.5 mg total) by mouth daily before breakfast.    Dispense:  30 tablet    Refill:  2      Follow-up: Return in about 3 months (around 01/31/2024) for Wellness Exam.  Sonda Primes, MD

## 2023-11-03 ENCOUNTER — Encounter: Payer: Self-pay | Admitting: Internal Medicine

## 2023-11-04 ENCOUNTER — Ambulatory Visit: Payer: BC Managed Care – PPO | Admitting: Family Medicine

## 2023-11-08 ENCOUNTER — Encounter (INDEPENDENT_AMBULATORY_CARE_PROVIDER_SITE_OTHER): Payer: Self-pay

## 2023-11-20 ENCOUNTER — Ambulatory Visit: Payer: BC Managed Care – PPO | Admitting: Internal Medicine

## 2023-12-03 ENCOUNTER — Ambulatory Visit: Payer: BC Managed Care – PPO | Admitting: Internal Medicine

## 2024-01-03 NOTE — Progress Notes (Unsigned)
 [facility] [address] [place] [address] Phone: [phone] Subjective:   Angel [name], am serving as a scribe for Dr. Ronnell Coins.  I'm seeing this patient by the request  of:  Plotnikov, Oakley Bellman, MD  CC: Right shoulder pain  [id number]  10/31/2023 Trigger point injections given today. Discussed icing regimen and home exercises hopefully this will be significantly beneficial. Does have the partial tearing noted. Discussed icing regimen and home exercises. Follow-up again in 6 to 8 weeks   Update 01/08/2024 Angel [name] is a 48 y.o. female coming in with complaint of R shoulder pain. Injections here did help and pain returned a couple of weeks ago. Pain over top of arm and into the R scapula. Pain can radiate down into upper arm.       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 Compliance  was encouraged.     Past Surgical History:  Procedure Laterality Date   BILATERAL SALPINGECTOMY  09/26/2015   Procedure: BILATERAL SALPINGECTOMY;  Surgeon: Renea Carrion, MD;  Location: [facility];  Service: Gynecology;;   BREAST BIOPSY Bilateral 07/13/2013   Procedure: BILATERAL EXCISION BREAS MASSES;  Surgeon: Rogena Class, MD;  Location: [facility];  Service: General;  Laterality: Bilateral;   BREAST CYST EXCISION Left 2014   4 cysts removed   DE QUERVAIN'S RELEASE Right 09/06/2003   DILITATION & CURRETTAGE/HYSTROSCOPY WITH NOVASURE ABLATION N/A 09/26/2015   Procedure: DILATATION & CURETTAGE/HYSTEROSCOPY WITH NOVASURE ABLATION;  Surgeon: Renea Carrion, MD;  Location: [facility];  Service: Gynecology;  Laterality: N/A;   LAPAROSCOPIC OVARIAN CYSTECTOMY Right 09/26/2015   Procedure: LAPAROSCOPIC Right OVARIAN CYSTECTOMY, Bilateral Peritoneal Biopsies over Uteralsacral area;  Surgeon: Renea Carrion, MD;  Location: [facility];  Service: Gynecology;  Laterality: Right;   LEFT HEART CATH AND CORONARY ANGIOGRAPHY N/A 12/30/2019   Procedure: LEFT HEART CATH AND CORONARY ANGIOGRAPHY;  Surgeon: Arleen Lacer, MD;  Location[facility];  Service: Cardiovascular;  Laterality: N/A;   MASS EXCISION Right 10/07/2023   Procedure: EXCISION RIGHT AXILLARY  MASS;  Surgeon: Oza Blumenthal, MD;  Location: [facility];  Service: General;  Laterality: Right;   SPINAL FUSION  07/05/2018   TUBAL LIGATION  12/27/2005   WISDOM TOOTH EXTRACTION     WRIST GANGLION EXCISION Right 09/06/2003   WRIST GANGLION EXCISION Left 09/04/1999   Social History  Socioeconomic History   Marital status: Married    Spouse name: Not on file   Number of children: 2   Years of education: Not on file   Highest education level: Not on file  Occupational History   Not on file  Tobacco Use   Smoking status: Never   Smokeless tobacco: Never  Vaping Use   Vaping status: Never Used  Substance and  Sexual Activity   Alcohol use: No   Drug use: No   Sexual activity: Not on file    Comment: BTL  Other Topics Concern   Not on file  Social History Narrative   Not on file   Social Drivers of Health   Financial Resource Strain: Not on file  Food Insecurity: No Food Insecurity (08/30/2021)   Received from [facility], [facility]   Hunger Vital Sign    Worried About Running Out of Food in the Last Year: Never true    Ran Out of Food in the Last Year: Never true  Transportation Needs: Not on file  Physical Activity: Not on file  Stress: Not on file  Social Connections: Unknown (12/10/2021)   Received from [facility], [facility]   Social Network    Social Network: Not on file   Allergies  Allergen Reactions   Other Dermatitis    Surgical glues Surgical glues   Butalbital -Acetaminophen      Did not like the effect   Lunesta  [Eszopiclone ]     Bad taste    Trazodone  And Nefazodone    Wound Dressing Adhesive     Other reaction(s): Rash:itching   Wound Dressings Other (See Comments)    Other reaction(s): Rash:itching   Zolpidem      Ate in her sleep   Family History  Problem Relation Age of Onset   Hypertension Father    Diabetes Father    Heart disease Father    Cancer Father        prostate   Sickle cell anemia Daughter    Sickle cell anemia Son    Birth defects Maternal Grandfather        prostate cancer     Current Outpatient Medications (Cardiovascular):    spironolactone (ALDACTONE) 50 MG tablet, Take 50 mg by mouth daily.  Current Outpatient Medications (Respiratory):    albuterol  (VENTOLIN  HFA) 108 (90 Base) MCG/ACT inhaler, INHALE 2 PUFFS INTO THE LUNGS EVERY 6 HOURS AS NEEDED FOR WHEEZING OR SHORTNESS OF BREATH   ipratropium (ATROVENT ) 0.06 % nasal spray, Place 2 sprays into both nostrils 2 (two) times daily.   promethazine  (PHENERGAN ) 25 MG tablet, TAKE 1 TABLET BY MOUTH EVERY 4 HOURS AS NEEDED FOR NAUSEA/VOMITING  Current Outpatient  Medications (Analgesics):    acetaminophen  (TYLENOL ) 500 MG tablet, Take 1,000 mg by mouth daily.    ibuprofen  (ADVIL ) 800 MG tablet, Take 800 mg by mouth.   Rimegepant Sulfate (NURTEC) 75 MG TBDP, TAKE 1 TABLET BY MOUTH ONCE EVERY OTHER DAY AS NEEDED   traMADol  (ULTRAM ) 50 MG tablet, Take 1 tablet (50 mg total) by mouth every 6 (six) hours as needed for severe pain (pain score 7-10) or moderate pain (pain score 4-6).   Current Outpatient Medications (Other):    betamethasone dipropionate 0.05 % cream, Apply 1 application topically daily as needed (Eczema).    clindamycin (CLINDAGEL) 1 % gel, Apply 1 application topically daily as needed (acne).    doxycycline  (VIBRAMYCIN ) 50 MG capsule, SMARTSIG:1-2 Capsule(s) By Mouth Every Evening   hydrocortisone  (ANUSOL -HC) 2.5 %  rectal cream, Use bid (Patient taking differently: Place 1 application  rectally 2 (two) times daily as needed for hemorrhoids.)   LORazepam  (ATIVAN ) 1 MG tablet, Take 1-2 tablets (1-2 mg total) by mouth at bedtime as needed. for sleep   Multiple Vitamin (MULTI-VITAMIN) tablet, Take 1 tablet by mouth daily.   ondansetron  (ZOFRAN ) 4 MG tablet, Take 1 tablet (4 mg total) by mouth every 8 (eight) hours as needed for nausea or vomiting.   Semaglutide -Weight Management (WEGOVY ) 0.25 MG/0.5ML SOAJ, Inject 0.25 mg into the skin once a week.   tiZANidine  (ZANAFLEX ) 4 MG tablet, Take 4 mg by mouth daily as needed (Back pain).    tretinoin (RETIN-A) 0.025 % cream, Apply topically at bedtime.   triamcinolone  ointment (KENALOG ) 0.1 %, Apply 1 Application topically 2 (two) times daily.   Vitamin D , Ergocalciferol , 50000 units CAPS, TAKE ONE CAPSULE BY MOUTH EVERY MONTH   phentermine  (ADIPEX-P ) 37.5 MG tablet, Take 1 tablet (37.5 mg total) by mouth daily before breakfast.   Reviewed prior external information including notes and imaging from  primary care provider As well as notes that were available from care everywhere and other healthcare  systems.  Past medical history, social, surgical and family history all reviewed in electronic medical record.  No pertanent information unless stated regarding to the chief complaint.   Review of Systems:  No headache, visual changes, nausea, vomiting, diarrhea, constipation, dizziness, abdominal pain, skin rash, fevers, chills, night sweats, weight loss, swollen lymph nodes, body aches, joint swelling, chest pain, shortness of breath, mood changes. POSITIVE muscle aches  Objective  Blood pressure 110/64, pulse 75, height 5\' 9"  (1.753 m), weight 194 lb (88 kg), SpO2 100%.   General: No apparent distress alert and oriented x3 mood and affect normal, dressed appropriately.  HEENT: Pupils equal, extraocular movements intact  Respiratory: Patient's speak in full sentences and does not appear short of breath  Cardiovascular: No lower extremity edema, non tender, no erythema  Significant trigger points noted again in the right shoulder blade.  Seems to be in the rhomboid, trapezius and the levator scapula.  Patient's right shoulder though does have improvement in range of motion otherwise.  Interesting trigger points that cause more difficulty.  Some limited sidebending and extension of the neck noted.  Mild positive Spurling's which is known which with radicular symptoms down the right arm.  After verbal consent patient was prepped with alcohol swab and with a 25-gauge half inch needle injected in the trapezius, rhomboid and levator scapula with a total of 3 cc 0.5% Marcaine  and 1 cc of Kenalog  40 mg/mL.  Minimal blood loss, Band-Aid placed.  Postinjection instructions given.    Impression and Recommendations:    The above documentation has been reviewed and is accurate and complete Cheryn Lundquist M Solana Coggin, DO

## 2024-01-08 ENCOUNTER — Encounter: Payer: Self-pay | Admitting: Family Medicine

## 2024-01-08 ENCOUNTER — Ambulatory Visit (INDEPENDENT_AMBULATORY_CARE_PROVIDER_SITE_OTHER)

## 2024-01-08 ENCOUNTER — Ambulatory Visit: Admitting: Family Medicine

## 2024-01-08 ENCOUNTER — Other Ambulatory Visit: Payer: Self-pay

## 2024-01-08 VITALS — BP 110/64 | HR 75 | Ht 69.0 in | Wt 194.0 lb

## 2024-01-08 DIAGNOSIS — M25511 Pain in right shoulder: Secondary | ICD-10-CM

## 2024-01-08 DIAGNOSIS — M542 Cervicalgia: Secondary | ICD-10-CM

## 2024-01-08 NOTE — Assessment & Plan Note (Signed)
 Chronic problem with exacerbation.  Discussed icing regimen and home exercises.  Discussed that I do think some of this could be secondary to some cervical radiculopathy.  Concerning because this continues to come back.  At this point I would like to consider the possibility of advanced imaging of the neck with a x-ray and MRI.  Depending on findings could be a candidate for possible epidurals.  We will discuss with patient once we have imaging to further evaluate and discuss.

## 2024-01-08 NOTE — Patient Instructions (Addendum)
 Xray today MRI [place] Trigger point injections today See me again in 3 months

## 2024-01-09 ENCOUNTER — Other Ambulatory Visit: Payer: Self-pay | Admitting: Obstetrics and Gynecology

## 2024-01-09 DIAGNOSIS — Z1231 Encounter for screening mammogram for malignant neoplasm of breast: Secondary | ICD-10-CM

## 2024-01-11 ENCOUNTER — Ambulatory Visit
Admission: RE | Admit: 2024-01-11 | Discharge: 2024-01-11 | Disposition: A | Discharge status: Home / Self Care | Source: Ambulatory Visit | Attending: Family Medicine | Admitting: Family Medicine

## 2024-01-11 DIAGNOSIS — M4722 Other spondylosis with radiculopathy, cervical region: Secondary | ICD-10-CM | POA: Diagnosis not present

## 2024-01-11 DIAGNOSIS — M542 Cervicalgia: Secondary | ICD-10-CM

## 2024-01-11 DIAGNOSIS — M4802 Spinal stenosis, cervical region: Secondary | ICD-10-CM | POA: Diagnosis not present

## 2024-01-16 ENCOUNTER — Ambulatory Visit: Payer: Self-pay | Admitting: Family Medicine

## 2024-01-22 ENCOUNTER — Other Ambulatory Visit: Payer: Self-pay

## 2024-01-22 DIAGNOSIS — M5412 Radiculopathy, cervical region: Secondary | ICD-10-CM

## 2024-01-24 DIAGNOSIS — H40023 Open angle with borderline findings, high risk, bilateral: Secondary | ICD-10-CM | POA: Diagnosis not present

## 2024-01-24 DIAGNOSIS — H2513 Age-related nuclear cataract, bilateral: Secondary | ICD-10-CM | POA: Diagnosis not present

## 2024-01-24 DIAGNOSIS — H524 Presbyopia: Secondary | ICD-10-CM | POA: Diagnosis not present

## 2024-01-28 NOTE — Discharge Instructions (Signed)

## 2024-01-29 ENCOUNTER — Ambulatory Visit
Admission: RE | Admit: 2024-01-29 | Discharge: 2024-01-29 | Disposition: A | Discharge status: Home / Self Care | Source: Ambulatory Visit | Attending: Family Medicine | Admitting: Family Medicine

## 2024-01-29 DIAGNOSIS — M5412 Radiculopathy, cervical region: Secondary | ICD-10-CM

## 2024-01-29 DIAGNOSIS — M4722 Other spondylosis with radiculopathy, cervical region: Secondary | ICD-10-CM | POA: Diagnosis not present

## 2024-01-29 MED ORDER — TRIAMCINOLONE ACETONIDE 40 MG/ML IJ SUSP (RADIOLOGY)
60.0000 mg | Freq: Once | INTRAMUSCULAR | Status: AC
  Administered 2024-01-29: 60 mg via EPIDURAL

## 2024-01-29 MED ORDER — IOPAMIDOL (ISOVUE-M 300) INJECTION 61%
1.0000 mL | Freq: Once | INTRAMUSCULAR | Status: AC | PRN
  Administered 2024-01-29: 1 mL via EPIDURAL

## 2024-02-04 ENCOUNTER — Ambulatory Visit (INDEPENDENT_AMBULATORY_CARE_PROVIDER_SITE_OTHER): Admitting: Internal Medicine

## 2024-02-04 ENCOUNTER — Encounter: Payer: Self-pay | Admitting: Internal Medicine

## 2024-02-04 VITALS — BP 118/70 | HR 66 | Temp 98.2°F | Ht 69.0 in | Wt 192.0 lb

## 2024-02-04 DIAGNOSIS — N6320 Unspecified lump in the left breast, unspecified quadrant: Secondary | ICD-10-CM

## 2024-02-04 DIAGNOSIS — E663 Overweight: Secondary | ICD-10-CM | POA: Diagnosis not present

## 2024-02-04 DIAGNOSIS — K5900 Constipation, unspecified: Secondary | ICD-10-CM | POA: Insufficient documentation

## 2024-02-04 DIAGNOSIS — G43509 Persistent migraine aura without cerebral infarction, not intractable, without status migrainosus: Secondary | ICD-10-CM | POA: Diagnosis not present

## 2024-02-04 DIAGNOSIS — M542 Cervicalgia: Secondary | ICD-10-CM | POA: Diagnosis not present

## 2024-02-04 MED ORDER — RIZATRIPTAN BENZOATE 10 MG PO TABS: 10.0000 mg | ORAL_TABLET | Freq: Every day | ORAL | 12 refills | Status: AC | PRN

## 2024-02-04 MED ORDER — IBUPROFEN 600 MG PO TABS: ORAL_TABLET | ORAL | 3 refills | Status: AC

## 2024-02-04 MED ORDER — NURTEC 75 MG PO TBDP: ORAL_TABLET | ORAL | 11 refills | Status: AC

## 2024-02-04 NOTE — Assessment & Plan Note (Signed)
 Pt lost wt Phentermine  if needed

## 2024-02-04 NOTE — Assessment & Plan Note (Signed)
 S/p breast surgery 10/2023: R breast tissue Final pathology showed that this was a 6.4 cm mass consistent with a sensory breast tissue with no atypia or evidence of malignancy.

## 2024-02-04 NOTE — Assessment & Plan Note (Addendum)
 Worse Continue with Nurtec one qod. Maxalt  and Ibuprofen  as needed

## 2024-02-04 NOTE — Assessment & Plan Note (Signed)
 R cerv radiculopathy - better w/epidural injection. Pt saw Dr Claudene

## 2024-02-04 NOTE — Progress Notes (Signed)
 Subjective:  Patient ID: Angel [name], female    DOB: [birth year]  Age: 48 y.o. MRN: [id number]  CC: Annual Exam   HPI Angel [name] presents for R cerv radiculopathy - better w/epidural injection. Pt saw Dr Claudene S/p breast surgery 10/2023: R breast tissue Final pathology showed that this was a 6.4 cm mass consistent with a sensory breast tissue with no atypia or evidence of malignancy. 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 F/u on HAs - on Nurtec  Outpatient Medications Prior to Visit  Medication Sig Dispense Refill   acetaminophen  (TYLENOL ) 500 MG tablet Take 1,000 mg by mouth daily.      albuterol  (VENTOLIN  HFA) 108 (90 Base) MCG/ACT inhaler INHALE 2 PUFFS INTO THE LUNGS EVERY 6 HOURS AS NEEDED FOR WHEEZING OR SHORTNESS OF BREATH 6.7 g 3   betamethasone dipropionate 0.05 % cream Apply 1 application topically daily as needed (Eczema).      clindamycin (CLINDAGEL) 1 % gel Apply 1 application topically daily as needed (acne).   11   doxycycline  (VIBRAMYCIN ) 50 MG capsule SMARTSIG:1-2 Capsule(s) By Mouth Every Evening     hydrocortisone  (ANUSOL -HC) 2.5 % rectal cream Use bid (Patient taking differently: Place 1 application  rectally 2 (two) times daily as needed for hemorrhoids.) 30 g 1   ipratropium (ATROVENT ) 0.06 % nasal spray Place 2 sprays into both nostrils 2 (two) times daily.     LORazepam  (ATIVAN ) 1 MG tablet Take 1-2 tablets (1-2 mg total) by mouth at bedtime as needed. for sleep 60 tablet 3   Multiple Vitamin (MULTI-VITAMIN) tablet Take 1 tablet by mouth daily.     ondansetron  (ZOFRAN ) 4 MG tablet Take 1 tablet (4 mg total) by mouth every 8 (eight) hours as needed for nausea or vomiting. 20 tablet 0   phentermine  (ADIPEX-P ) 37.5 MG tablet Take 1 tablet (37.5 mg total) by mouth daily before breakfast. 30 tablet 2   promethazine  (PHENERGAN ) 25 MG tablet TAKE 1 TABLET BY MOUTH EVERY 4 HOURS AS NEEDED FOR NAUSEA/VOMITING 30 tablet 3   Semaglutide -Weight Management  (WEGOVY ) 0.25 MG/0.5ML SOAJ Inject 0.25 mg into the skin once a week. 2 mL 2   spironolactone (ALDACTONE) 50 MG tablet Take 50 mg by mouth daily.     tiZANidine  (ZANAFLEX ) 4 MG tablet Take 4 mg by mouth daily as needed (Back pain).      traMADol  (ULTRAM ) 50 MG tablet Take 1 tablet (50 mg total) by mouth every 6 (six) hours as needed for severe pain (pain score 7-10) or moderate pain (pain score 4-6). 25 tablet 0   tretinoin (RETIN-A) 0.025 % cream Apply topically at bedtime.     triamcinolone  ointment (KENALOG ) 0.1 % Apply 1 Application topically 2 (two) times daily.     Vitamin D , Ergocalciferol , 50000 units CAPS TAKE ONE CAPSULE BY MOUTH EVERY MONTH 3 capsule 3   ibuprofen  (ADVIL ) 800 MG tablet Take 800 mg by mouth.     Rimegepant Sulfate (NURTEC) 75 MG TBDP TAKE 1 TABLET BY MOUTH ONCE EVERY OTHER DAY AS NEEDED 8 tablet 11   No facility-administered medications prior to visit.    ROS: Review of Systems  Constitutional:  Negative for activity change, appetite change, chills, fatigue and unexpected weight change.  HENT:  Negative for congestion, mouth sores and sinus pressure.   Eyes:  Negative for visual disturbance.  Respiratory:  Negative for cough and chest tightness.   Gastrointestinal:  Negative for abdominal pain and nausea.  Genitourinary:  Negative for difficulty urinating, frequency and vaginal pain.  Musculoskeletal:  Positive for back pain, neck pain and neck stiffness. Negative for gait problem.  Skin:  Negative for pallor and rash.  Neurological:  Negative for dizziness, tremors, weakness, numbness and headaches.  Psychiatric/Behavioral:  Negative for confusion and sleep disturbance.     Objective:  BP 118/70   Pulse 66   Temp 98.2 F (36.8 C) (Oral)   Ht 5' 9 (1.753 m)   Wt 192 lb (87.1 kg)   SpO2 99%   BMI 28.35 kg/m   BP Readings from Last 3 Encounters:  02/04/24 118/70  01/29/24 131/68  01/08/24 110/64    Wt Readings from Last 3 Encounters:  02/04/24  192 lb (87.1 kg)  01/08/24 194 lb (88 kg)  10/31/23 196 lb (88.9 kg)    Physical Exam Constitutional:      General: She is not in acute distress.    Appearance: Normal appearance. She is well-developed.  HENT:     Head: Normocephalic.     Right Ear: External ear normal.     Left Ear: External ear normal.     Nose: Nose normal.   Eyes:     General:        Right eye: No discharge.        Left eye: No discharge.     Conjunctiva/sclera: Conjunctivae normal.     Pupils: Pupils are equal, round, and reactive to light.   Neck:     Thyroid : No thyromegaly.     Vascular: No JVD.     Trachea: No tracheal deviation.   Cardiovascular:     Rate and Rhythm: Normal rate and regular rhythm.     Heart sounds: Normal heart sounds.  Pulmonary:     Effort: No respiratory distress.     Breath sounds: No stridor. No wheezing.  Abdominal:     General: Bowel sounds are normal. There is no distension.     Palpations: Abdomen is soft. There is no mass.     Tenderness: There is no abdominal tenderness. There is no guarding or rebound.   Musculoskeletal:        General: Tenderness present.     Cervical back: Normal range of motion and neck supple. No rigidity.  Lymphadenopathy:     Cervical: No cervical adenopathy.   Skin:    Findings: No erythema or rash.   Neurological:     Cranial Nerves: No cranial nerve deficit.     Motor: No abnormal muscle tone.     Coordination: Coordination normal.     Deep Tendon Reflexes: Reflexes normal.   Psychiatric:        Behavior: Behavior normal.        Thought Content: Thought content normal.        Judgment: Judgment normal.   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 Right-sided neck, shoulder, and arm pain with numbness in the fingers of the right hand. FLUOROSCOPY: Radiation Exposure Index (as provided by the fluoroscopic device): 0.60 mGy Kerma PROCEDURE: The procedure, risks, benefits, and alternatives were explained to the patient. Questions regarding the procedure were encouraged and answered. The patient understands and consents to the procedure. CERVICAL EPIDURAL INJECTION An interlaminar approach was performed on the right at C7-T1. A 3.5 inch 20 gauge epidural needle was advanced using loss-of-resistance technique. DIAGNOSTIC EPIDURAL INJECTION Injection of Isovue -M 300 shows a good epidural pattern with spread above and below the level of needle placement primarily on the right. No vascular opacification is seen. THERAPEUTIC EPIDURAL INJECTION 60 mg of Kenalog  mixed with 2 mL of normal saline were then instilled. The procedure was well-tolerated, and the patient was discharged 20 minutes following the injection in good condition. IMPRESSION: Technically successful interlaminar epidural injection on the right at C7-T1. Electronically Signed   By: Dasie Hamburg M.D.   On: 01/29/2024 15:45    Assessment & Plan:   Problem List Items Addressed This Visit     Mass of multiple sites of left breast - Primary   S/p breast surgery 10/2023: R breast tissue Final pathology showed that this was a 6.4 cm mass consistent with a sensory breast tissue with no atypia or evidence of malignancy.      Neck pain    R cerv radiculopathy - better w/epidural injection. Pt saw Dr Claudene      Overweight   Pt lost  wt Phentermine  if needed      Migraine headache   Worse Continue with Nurtec one qod. Maxalt  and Ibuprofen  as needed      Relevant Medications   Rimegepant Sulfate (NURTEC) 75 MG TBDP   rizatriptan  (MAXALT ) 10 MG tablet   ibuprofen  (ADVIL ) 600 MG tablet      Meds ordered this encounter  Medications   Rimegepant Sulfate (NURTEC) 75 MG TBDP    Sig: 1 po every other day for prophylaxis    Dispense:  15 tablet    Refill:  11   rizatriptan  (MAXALT ) 10 MG tablet    Sig: Take 1 tablet (10 mg total) by mouth daily as needed for migraine. May repeat in 2 hours if needed    Dispense:  12 tablet    Refill:  12   ibuprofen  (ADVIL ) 600 MG tablet    Sig: 1 po tid pc prn pain    Dispense:  60 tablet    Refill:  3      Follow-up: Return in about 3 months (around 05/06/2024) for a follow-up visit.  Marolyn Noel, MD

## 2024-02-04 NOTE — Patient Instructions (Signed)
 Infrared Heating Pad for Pain Relief, Flexible Infrared Heating Therapy

## 2024-02-17 ENCOUNTER — Other Ambulatory Visit (HOSPITAL_COMMUNITY): Payer: Self-pay

## 2024-02-17 ENCOUNTER — Telehealth: Payer: Self-pay

## 2024-02-17 NOTE — Telephone Encounter (Signed)
 Pharmacy Patient Advocate Encounter   Received notification from CoverMyMeds that prior authorization for Nurtec 75 is required/requested.   Insurance verification completed.   The patient is insured through [facility] .   Per test claim: PA required; PA submitted to above mentioned insurance via CoverMyMeds Key/confirmation #/EOC [facility] Status is pending

## 2024-02-20 ENCOUNTER — Other Ambulatory Visit (HOSPITAL_COMMUNITY): Payer: Self-pay

## 2024-02-20 NOTE — Telephone Encounter (Signed)
 Insurance required additional information. Filled out and faxed back to [phone]

## 2024-02-20 NOTE — Telephone Encounter (Signed)
 Copied from CRM [id number]. Topic: Clinical - Medication Prior Auth >> Feb 20, 2024 11:25 AM Franky GRADE wrote: Reason for CRM: Angel [name] is calling to advise that the Prior Auth for Rimegepant Sulfate (NURTEC) 75 MG TBDP [[id number]] has been denied.  They will be faxing over information to the clinic as well for alternatives. Best call back number is [phone] option 3 then option 1.

## 2024-02-21 NOTE — Telephone Encounter (Signed)
 Denial letter in media:

## 2024-02-28 ENCOUNTER — Encounter: Payer: Self-pay | Admitting: Advanced Practice Midwife

## 2024-03-06 DIAGNOSIS — R87810 Cervical high risk human papillomavirus (HPV) DNA test positive: Secondary | ICD-10-CM | POA: Diagnosis not present

## 2024-03-06 DIAGNOSIS — R87610 Atypical squamous cells of undetermined significance on cytologic smear of cervix (ASC-US): Secondary | ICD-10-CM | POA: Diagnosis not present

## 2024-03-06 DIAGNOSIS — Z1330 Encounter for screening examination for mental health and behavioral disorders, unspecified: Secondary | ICD-10-CM | POA: Diagnosis not present

## 2024-03-06 DIAGNOSIS — Z01419 Encounter for gynecological examination (general) (routine) without abnormal findings: Secondary | ICD-10-CM | POA: Diagnosis not present

## 2024-03-11 LAB — HM PAP SMEAR
HM Pap smear: ABNORMAL
HPV, high-risk: NEGATIVE

## 2024-03-24 DIAGNOSIS — J069 Acute upper respiratory infection, unspecified: Secondary | ICD-10-CM | POA: Diagnosis not present

## 2024-03-25 ENCOUNTER — Ambulatory Visit
Admission: RE | Admit: 2024-03-25 | Discharge: 2024-03-25 | Disposition: A | Discharge status: Home / Self Care | Source: Ambulatory Visit | Attending: Obstetrics and Gynecology | Admitting: Obstetrics and Gynecology

## 2024-03-25 DIAGNOSIS — Z1231 Encounter for screening mammogram for malignant neoplasm of breast: Secondary | ICD-10-CM | POA: Diagnosis not present

## 2024-03-27 ENCOUNTER — Other Ambulatory Visit: Payer: Self-pay | Admitting: Obstetrics and Gynecology

## 2024-03-27 DIAGNOSIS — R928 Other abnormal and inconclusive findings on diagnostic imaging of breast: Secondary | ICD-10-CM

## 2024-03-29 ENCOUNTER — Other Ambulatory Visit: Payer: Self-pay | Admitting: Medical Genetics

## 2024-04-03 ENCOUNTER — Ambulatory Visit
Admission: RE | Admit: 2024-04-03 | Discharge: 2024-04-03 | Disposition: A | Discharge status: Home / Self Care | Source: Ambulatory Visit | Attending: Obstetrics and Gynecology | Admitting: Obstetrics and Gynecology

## 2024-04-03 ENCOUNTER — Inpatient Hospital Stay
Admission: RE | Admit: 2024-04-03 | Discharge: 2024-04-03 | Discharge status: Home / Self Care | Source: Ambulatory Visit | Attending: Obstetrics and Gynecology | Admitting: Obstetrics and Gynecology

## 2024-04-03 DIAGNOSIS — R921 Mammographic calcification found on diagnostic imaging of breast: Secondary | ICD-10-CM | POA: Diagnosis not present

## 2024-04-03 DIAGNOSIS — R928 Other abnormal and inconclusive findings on diagnostic imaging of breast: Secondary | ICD-10-CM

## 2024-04-03 DIAGNOSIS — N6012 Diffuse cystic mastopathy of left breast: Secondary | ICD-10-CM | POA: Diagnosis not present

## 2024-04-15 ENCOUNTER — Ambulatory Visit: Admitting: Family Medicine

## 2024-04-27 ENCOUNTER — Ambulatory Visit: Admitting: Internal Medicine

## 2024-04-27 ENCOUNTER — Telehealth: Payer: Self-pay

## 2024-04-27 ENCOUNTER — Encounter: Payer: Self-pay | Admitting: Internal Medicine

## 2024-04-27 ENCOUNTER — Other Ambulatory Visit: Payer: Self-pay | Admitting: Internal Medicine

## 2024-04-27 VITALS — BP 100/72 | HR 80 | Temp 98.4°F | Ht 66.0 in | Wt 193.6 lb

## 2024-04-27 DIAGNOSIS — G43509 Persistent migraine aura without cerebral infarction, not intractable, without status migrainosus: Secondary | ICD-10-CM

## 2024-04-27 DIAGNOSIS — R112 Nausea with vomiting, unspecified: Secondary | ICD-10-CM

## 2024-04-27 MED ORDER — TRAMADOL HCL 50 MG PO TABS: 50.0000 mg | ORAL_TABLET | Freq: Four times a day (QID) | ORAL | 1 refills | Status: AC | PRN

## 2024-04-27 MED ORDER — KETOROLAC TROMETHAMINE 60 MG/2ML IM SOLN
60.0000 mg | Freq: Once | INTRAMUSCULAR | Status: AC
  Administered 2024-04-27: 60 mg via INTRAMUSCULAR

## 2024-04-27 MED ORDER — ONDANSETRON HCL 4 MG/2ML IJ SOLN
4.0000 mg | Freq: Once | INTRAMUSCULAR | Status: AC
  Administered 2024-04-27: 4 mg via INTRAMUSCULAR

## 2024-04-27 MED ORDER — METHYLPREDNISOLONE 4 MG PO TBPK: ORAL_TABLET | ORAL | 0 refills | Status: AC

## 2024-04-27 MED ORDER — DICLOFENAC POTASSIUM 50 MG PO TABS: 50.0000 mg | ORAL_TABLET | Freq: Three times a day (TID) | ORAL | 3 refills | Status: AC | PRN

## 2024-04-27 MED ORDER — ONDANSETRON HCL 4 MG PO TABS: 4.0000 mg | ORAL_TABLET | Freq: Three times a day (TID) | ORAL | 0 refills | Status: DC | PRN

## 2024-04-27 NOTE — Patient Instructions (Signed)

## 2024-04-27 NOTE — Telephone Encounter (Signed)
 Pharmacy Patient Advocate Encounter   Received notification from Onbase that prior authorization for Nurtec 75MG  dispersible tablets is required/requested.   Insurance verification completed.   The patient is insured through [facility] .   Per test claim:  AIMOVIG, AJOVY, EMGALITY 120MG   is preferred by the insurance.  If suggested medication is appropriate, Please send in a new RX and discontinue this one. If not, please advise as to why it's not appropriate so that we may request a Prior Authorization. Please note, some preferred medications may still require a PA.  If the suggested medications have not been trialed and there are no contraindications to their use, the PA will not be submitted, as it will not be approved.

## 2024-04-27 NOTE — Progress Notes (Signed)
 Subjective:  Patient ID: Angel [name], female    DOB: October 30, 1975  Age: 48 y.o. MRN: [id number]  CC: Migraine (For 4 days. Nothing is helping get rid of headache. )   HPI Angel [name] presents for severe HA 9 out of 10 in intensity for 2 to 3 days.  She was unable to sleep last night.  She is nauseated.  She tried all available to her pain/headache medicines at home with no relief  Outpatient Medications Prior to Visit  Medication Sig Dispense Refill   acetaminophen  (TYLENOL ) 500 MG tablet Take 1,000 mg by mouth daily.      clindamycin (CLINDAGEL) 1 % gel Apply 1 application topically daily as needed (acne).   11   ibuprofen  (ADVIL ) 600 MG tablet 1 po tid pc prn pain 60 tablet 3   ipratropium (ATROVENT ) 0.06 % nasal spray Place 2 sprays into both nostrils 2 (two) times daily.     promethazine  (PHENERGAN ) 25 MG tablet TAKE 1 TABLET BY MOUTH EVERY 4 HOURS AS NEEDED FOR NAUSEA/VOMITING 30 tablet 3   Rimegepant Sulfate (NURTEC) 75 MG TBDP 1 po every other day for prophylaxis 15 tablet 11   rizatriptan  (MAXALT ) 10 MG tablet Take 1 tablet (10 mg total) by mouth daily as needed for migraine. May repeat in 2 hours if needed 12 tablet 12   spironolactone (ALDACTONE) 50 MG tablet Take 50 mg by mouth daily.     tiZANidine  (ZANAFLEX ) 4 MG tablet Take 4 mg by mouth daily as needed (Back pain).      tretinoin (RETIN-A) 0.025 % cream Apply topically at bedtime.     Vitamin D , Ergocalciferol , 50000 units CAPS TAKE ONE CAPSULE BY MOUTH EVERY MONTH 3 capsule 3   LORazepam  (ATIVAN ) 1 MG tablet Take 1-2 tablets (1-2 mg total) by mouth at bedtime as needed. for sleep 60 tablet 3   albuterol  (VENTOLIN  HFA) 108 (90 Base) MCG/ACT inhaler INHALE 2 PUFFS INTO THE LUNGS EVERY 6 HOURS AS NEEDED FOR WHEEZING OR SHORTNESS OF BREATH (Patient not taking: Reported on 04/27/2024) 6.7 g 3   betamethasone dipropionate 0.05 % cream Apply 1 application topically daily as needed (Eczema).      doxycycline  (VIBRAMYCIN ) 50  MG capsule SMARTSIG:1-2 Capsule(s) By Mouth Every Evening     hydrocortisone  (ANUSOL -HC) 2.5 % rectal cream Use bid (Patient not taking: Reported on 04/27/2024) 30 g 1   Multiple Vitamin (MULTI-VITAMIN) tablet Take 1 tablet by mouth daily. (Patient not taking: Reported on 04/27/2024)     ondansetron  (ZOFRAN ) 4 MG tablet Take 1 tablet (4 mg total) by mouth every 8 (eight) hours as needed for nausea or vomiting. (Patient not taking: Reported on 04/27/2024) 20 tablet 0   phentermine  (ADIPEX-P ) 37.5 MG tablet Take 1 tablet (37.5 mg total) by mouth daily before breakfast. (Patient not taking: Reported on 04/27/2024) 30 tablet 2   Semaglutide -Weight Management (WEGOVY ) 0.25 MG/0.5ML SOAJ Inject 0.25 mg into the skin once a week. (Patient not taking: Reported on 04/27/2024) 2 mL 2   triamcinolone  ointment (KENALOG ) 0.1 % Apply 1 Application topically 2 (two) times daily.     traMADol  (ULTRAM ) 50 MG tablet Take 1 tablet (50 mg total) by mouth every 6 (six) hours as needed for severe pain (pain score 7-10) or moderate pain (pain score 4-6). (Patient not taking: Reported on 04/27/2024) 25 tablet 0   No facility-administered medications prior to visit.    ROS: Review of Systems  Constitutional:  Positive for fatigue. Negative for  activity change, appetite change, chills and unexpected weight change.  HENT:  Negative for congestion, mouth sores and sinus pressure.   Eyes:  Negative for visual disturbance.  Respiratory:  Negative for cough and chest tightness.   Gastrointestinal:  Negative for abdominal pain and nausea.  Genitourinary:  Negative for difficulty urinating, frequency and vaginal pain.  Musculoskeletal:  Negative for back pain and gait problem.  Skin:  Negative for pallor and rash.  Neurological:  Positive for headaches. Negative for dizziness, tremors, weakness and numbness.  Psychiatric/Behavioral:  Negative for confusion, sleep disturbance and suicidal ideas.     Objective:  BP 100/72    Pulse 80   Temp 98.4 F (36.9 C) (Oral)   Ht 5' 6 (1.676 m)   Wt 193 lb 9.6 oz (87.8 kg)   SpO2 98%   BMI 31.25 kg/m   BP Readings from Last 3 Encounters:  04/27/24 100/72  02/04/24 118/70  01/29/24 131/68    Wt Readings from Last 3 Encounters:  04/27/24 193 lb 9.6 oz (87.8 kg)  02/04/24 192 lb (87.1 kg)  01/08/24 194 lb (88 kg)    Physical Exam Constitutional:      General: She is not in acute distress.    Appearance: She is well-developed. She is obese. She is ill-appearing.  HENT:     Head: Normocephalic.     Right Ear: External ear normal.     Left Ear: External ear normal.     Nose: Nose normal.  Eyes:     General:        Right eye: No discharge.        Left eye: No discharge.     Conjunctiva/sclera: Conjunctivae normal.     Pupils: Pupils are equal, round, and reactive to light.  Neck:     Thyroid : No thyromegaly.     Vascular: No JVD.     Trachea: No tracheal deviation.  Cardiovascular:     Rate and Rhythm: Normal rate and regular rhythm.     Heart sounds: Normal heart sounds.  Pulmonary:     Effort: No respiratory distress.     Breath sounds: No stridor. No wheezing.  Abdominal:     General: Bowel sounds are normal. There is no distension.     Palpations: Abdomen is soft. There is no mass.     Tenderness: There is no abdominal tenderness. There is no guarding or rebound.  Musculoskeletal:        General: No tenderness.     Cervical back: Normal range of motion and neck supple. No rigidity.  Lymphadenopathy:     Cervical: No cervical adenopathy.  Skin:    Findings: No erythema or rash.  Neurological:     Mental Status: She is oriented to person, place, and time.     Cranial Nerves: No cranial nerve deficit.     Motor: No abnormal muscle tone.     Coordination: Coordination normal.     Deep Tendon Reflexes: Reflexes normal.  Psychiatric:        Behavior: Behavior normal.        Thought Content: Thought content normal.        Judgment: Judgment  normal.   The patient appears tired No meningeal signs  Lab Results  Component Value Date   WBC 4.3 10/31/2023   HGB 13.0 10/31/2023   HCT 39.1 10/31/2023   PLT 293.0 10/31/2023   GLUCOSE 95 10/31/2023   CHOL 200 10/31/2023   TRIG 60.0 10/31/2023  HDL 73.30 10/31/2023   LDLCALC 115 (H) 10/31/2023   ALT 10 10/31/2023   AST 12 10/31/2023   NA 139 10/31/2023   K 4.2 10/31/2023   CL 106 10/31/2023   CREATININE 0.86 10/31/2023   BUN 13 10/31/2023   CO2 28 10/31/2023   TSH 1.72 10/31/2023   HGBA1C 5.8 10/31/2023    MM 3D DIAGNOSTIC MAMMOGRAM UNILATERAL LEFT BREAST Result Date: 04/03/2024 CLINICAL DATA:  Recall for possible LEFT breast asymmetry and calcifications. Prior history of multiple LEFT breast cyst excisions in 2014. No personal or family history of breast cancer. EXAM: DIGITAL DIAGNOSTIC UNILATERAL LEFT MAMMOGRAM WITH TOMOSYNTHESIS AND CAD; ULTRASOUND LEFT BREAST LIMITED TECHNIQUE: Left digital diagnostic mammography and breast tomosynthesis was performed. The images were evaluated with computer-aided detection. ; Targeted ultrasound examination of the left breast was performed. COMPARISON:  Previous exam(s). ACR Breast Density Category c: The breasts are heterogeneously dense, which may obscure small masses. FINDINGS: LEFT: Mammogram: Full field ML, magnification CC and ML, and spot compression MLO and CC views of the LEFT best were obtained. Oval mass with indistinct margins persists in the posterior depth of the inner LEFT breast. Two calcifications persist within the inner LEFT breast on the magnification views, 1 of which is round and the other is layering, consistent with benign etiology. Ultrasound: Targeted sonographic evaluation of the LEFT breast demonstrates an oval anechoic circumscribed mass measuring 1.6 x 0.4 x 1.0 cm (9 o'clock 4 CMFN), which corresponds to the mammographic mass. This is consistent with a benign simple cyst. IMPRESSION: 1. No evidence of LEFT breast  malignancy. 2. Benign 1.6 cm simple cyst of the inner LEFT breast. 3. Benign-appearing calcifications of the inner LEFT breast. RECOMMENDATION: Screening mammogram in one year.(Code:SM-B-01Y) I have discussed the findings and recommendations with the patient. If applicable, a reminder letter will be sent to the patient regarding the next appointment. BI-RADS CATEGORY  2: Benign. Electronically Signed   By: Aliene Lloyd M.D.   On: 04/03/2024 14:08   US  LIMITED ULTRASOUND INCLUDING AXILLA LEFT BREAST  Result Date: 04/03/2024 CLINICAL DATA:  Recall for possible LEFT breast asymmetry and calcifications. Prior history of multiple LEFT breast cyst excisions in 2014. No personal or family history of breast cancer. EXAM: DIGITAL DIAGNOSTIC UNILATERAL LEFT MAMMOGRAM WITH TOMOSYNTHESIS AND CAD; ULTRASOUND LEFT BREAST LIMITED TECHNIQUE: Left digital diagnostic mammography and breast tomosynthesis was performed. The images were evaluated with computer-aided detection. ; Targeted ultrasound examination of the left breast was performed. COMPARISON:  Previous exam(s). ACR Breast Density Category c: The breasts are heterogeneously dense, which may obscure small masses. FINDINGS: LEFT: Mammogram: Full field ML, magnification CC and ML, and spot compression MLO and CC views of the LEFT best were obtained. Oval mass with indistinct margins persists in the posterior depth of the inner LEFT breast. Two calcifications persist within the inner LEFT breast on the magnification views, 1 of which is round and the other is layering, consistent with benign etiology. Ultrasound: Targeted sonographic evaluation of the LEFT breast demonstrates an oval anechoic circumscribed mass measuring 1.6 x 0.4 x 1.0 cm (9 o'clock 4 CMFN), which corresponds to the mammographic mass. This is consistent with a benign simple cyst. IMPRESSION: 1. No evidence of LEFT breast malignancy. 2. Benign 1.6 cm simple cyst of the inner LEFT breast. 3. Benign-appearing  calcifications of the inner LEFT breast. RECOMMENDATION: Screening mammogram in one year.(Code:SM-B-01Y) I have discussed the findings and recommendations with the patient. If applicable, a reminder letter will be sent to  the patient regarding the next appointment. BI-RADS CATEGORY  2: Benign. Electronically Signed   By: Aliene Lloyd M.D.   On: 04/03/2024 14:08    Assessment & Plan:   Problem List Items Addressed This Visit     Migraine headache   Severe migraine headache.  Nurtec sample given.  Toradol  and Zofran  injections given Medrol  DosePack      Relevant Medications   traMADol  (ULTRAM ) 50 MG tablet   diclofenac  (CATAFLAM ) 50 MG tablet   Nausea and vomiting - Primary   Zofran  IM given         Meds ordered this encounter  Medications   traMADol  (ULTRAM ) 50 MG tablet    Sig: Take 1-2 tablets (50-100 mg total) by mouth every 6 (six) hours as needed for severe pain (pain score 7-10) or moderate pain (pain score 4-6).    Dispense:  20 tablet    Refill:  1   diclofenac  (CATAFLAM ) 50 MG tablet    Sig: Take 1 tablet (50 mg total) by mouth 3 (three) times daily as needed.    Dispense:  90 tablet    Refill:  3   methylPREDNISolone  (MEDROL  DOSEPAK) 4 MG TBPK tablet    Sig: As directed    Dispense:  21 tablet    Refill:  0   ketorolac  (TORADOL ) injection 60 mg   DISCONTD: ondansetron  (ZOFRAN ) 4 MG tablet    Sig: Take 1 tablet (4 mg total) by mouth every 8 (eight) hours as needed for nausea or vomiting.    Dispense:  20 tablet    Refill:  0   ondansetron  (ZOFRAN ) injection 4 mg      Follow-up: Return for a follow-up visit.  Marolyn Noel, MD

## 2024-05-01 NOTE — Telephone Encounter (Signed)
 Please inform the patient of the coverage problem.  Is she willing to try injectable migraine prophylaxis medication, i.e. Emgality? Thanks

## 2024-05-03 ENCOUNTER — Encounter: Payer: Self-pay | Admitting: Internal Medicine

## 2024-05-03 NOTE — Assessment & Plan Note (Signed)
 Severe migraine headache.  Nurtec sample given.  Toradol  and Zofran  injections given Medrol  DosePack

## 2024-05-03 NOTE — Assessment & Plan Note (Signed)
 Zofran  IM given

## 2024-05-06 ENCOUNTER — Ambulatory Visit: Admitting: Internal Medicine

## 2024-05-06 ENCOUNTER — Encounter: Payer: Self-pay | Admitting: Internal Medicine

## 2024-05-06 VITALS — BP 100/78 | HR 66 | Temp 98.6°F | Ht 66.0 in | Wt 194.0 lb

## 2024-05-06 DIAGNOSIS — E559 Vitamin D deficiency, unspecified: Secondary | ICD-10-CM

## 2024-05-06 DIAGNOSIS — G43509 Persistent migraine aura without cerebral infarction, not intractable, without status migrainosus: Secondary | ICD-10-CM | POA: Diagnosis not present

## 2024-05-06 DIAGNOSIS — F409 Phobic anxiety disorder, unspecified: Secondary | ICD-10-CM

## 2024-05-06 DIAGNOSIS — Z23 Encounter for immunization: Secondary | ICD-10-CM

## 2024-05-06 DIAGNOSIS — R0683 Snoring: Secondary | ICD-10-CM | POA: Insufficient documentation

## 2024-05-06 DIAGNOSIS — G4700 Insomnia, unspecified: Secondary | ICD-10-CM

## 2024-05-06 MED ORDER — TEMAZEPAM 15 MG PO CAPS: 15.0000 mg | ORAL_CAPSULE | Freq: Every evening | ORAL | 3 refills | Status: DC | PRN

## 2024-05-06 NOTE — Progress Notes (Signed)
 Subjective:  Patient ID: Angel [name], female    DOB: 1975/09/24  Age: 48 y.o. MRN: [id number]  CC: Follow-up (Patient states nothing to discuss.  States she was here last week for her headache and states she is feeling a little better. )   HPI Angel [name] presents for HAs, insomnia, stress  Outpatient Medications Prior to Visit  Medication Sig Dispense Refill   acetaminophen  (TYLENOL ) 500 MG tablet Take 1,000 mg by mouth daily.      albuterol  (VENTOLIN  HFA) 108 (90 Base) MCG/ACT inhaler INHALE 2 PUFFS INTO THE LUNGS EVERY 6 HOURS AS NEEDED FOR WHEEZING OR SHORTNESS OF BREATH 6.7 g 3   betamethasone dipropionate 0.05 % cream Apply 1 application topically daily as needed (Eczema).      clindamycin (CLINDAGEL) 1 % gel Apply 1 application topically daily as needed (acne).   11   diclofenac  (CATAFLAM ) 50 MG tablet Take 1 tablet (50 mg total) by mouth 3 (three) times daily as needed. 90 tablet 3   doxycycline  (VIBRAMYCIN ) 50 MG capsule SMARTSIG:1-2 Capsule(s) By Mouth Every Evening     hydrocortisone  (ANUSOL -HC) 2.5 % rectal cream Use bid 30 g 1   ibuprofen  (ADVIL ) 600 MG tablet 1 po tid pc prn pain 60 tablet 3   ipratropium (ATROVENT ) 0.06 % nasal spray Place 2 sprays into both nostrils 2 (two) times daily.     methylPREDNISolone  (MEDROL  DOSEPAK) 4 MG TBPK tablet As directed 21 tablet 0   Multiple Vitamin (MULTI-VITAMIN) tablet Take 1 tablet by mouth daily.     ondansetron  (ZOFRAN ) 4 MG tablet Take 1 tablet (4 mg total) by mouth every 8 (eight) hours as needed for nausea or vomiting. 20 tablet 0   phentermine  (ADIPEX-P ) 37.5 MG tablet Take 1 tablet (37.5 mg total) by mouth daily before breakfast. 30 tablet 2   promethazine  (PHENERGAN ) 25 MG tablet TAKE 1 TABLET BY MOUTH EVERY 4 HOURS AS NEEDED FOR NAUSEA/VOMITING 30 tablet 3   Rimegepant Sulfate (NURTEC) 75 MG TBDP 1 po every other day for prophylaxis 15 tablet 11   rizatriptan  (MAXALT ) 10 MG tablet Take 1 tablet (10 mg total) by  mouth daily as needed for migraine. May repeat in 2 hours if needed 12 tablet 12   Semaglutide -Weight Management (WEGOVY ) 0.25 MG/0.5ML SOAJ Inject 0.25 mg into the skin once a week. 2 mL 2   spironolactone (ALDACTONE) 50 MG tablet Take 50 mg by mouth daily.     tiZANidine  (ZANAFLEX ) 4 MG tablet Take 4 mg by mouth daily as needed (Back pain).      traMADol  (ULTRAM ) 50 MG tablet Take 1-2 tablets (50-100 mg total) by mouth every 6 (six) hours as needed for severe pain (pain score 7-10) or moderate pain (pain score 4-6). 20 tablet 1   tretinoin (RETIN-A) 0.025 % cream Apply topically at bedtime.     triamcinolone  ointment (KENALOG ) 0.1 % Apply 1 Application topically 2 (two) times daily.     Vitamin D , Ergocalciferol , 50000 units CAPS TAKE ONE CAPSULE BY MOUTH EVERY MONTH 3 capsule 3   LORazepam  (ATIVAN ) 1 MG tablet TAKE 1-2 TABLETS (1-2 MG TOTAL) BY MOUTH AT BEDTIME AS NEEDED FOR SLEEP 60 tablet 1   No facility-administered medications prior to visit.    ROS: Review of Systems  Constitutional:  Positive for fatigue. Negative for activity change, appetite change, chills and unexpected weight change.  HENT:  Negative for congestion, mouth sores and sinus pressure.   Eyes:  Negative for visual  disturbance.  Respiratory:  Negative for cough and chest tightness.   Gastrointestinal:  Negative for abdominal pain and nausea.  Genitourinary:  Negative for difficulty urinating, frequency and vaginal pain.  Musculoskeletal:  Negative for back pain and gait problem.  Skin:  Negative for pallor and rash.  Neurological:  Positive for headaches. Negative for dizziness, tremors, weakness and numbness.  Psychiatric/Behavioral:  Positive for sleep disturbance. Negative for confusion and suicidal ideas. The patient is nervous/anxious.     Objective:  BP 100/78   Pulse 66   Temp 98.6 F (37 C) (Oral)   Ht 5' 6 (1.676 m)   Wt 194 lb (88 kg)   SpO2 99%   BMI 31.31 kg/m   BP Readings from Last 3  Encounters:  05/06/24 100/78  04/27/24 100/72  02/04/24 118/70    Wt Readings from Last 3 Encounters:  05/06/24 194 lb (88 kg)  04/27/24 193 lb 9.6 oz (87.8 kg)  02/04/24 192 lb (87.1 kg)    Physical Exam Constitutional:      General: She is not in acute distress.    Appearance: She is well-developed. She is obese.  HENT:     Head: Normocephalic.     Right Ear: External ear normal.     Left Ear: External ear normal.     Nose: Nose normal.  Eyes:     General:        Right eye: No discharge.        Left eye: No discharge.     Conjunctiva/sclera: Conjunctivae normal.     Pupils: Pupils are equal, round, and reactive to light.  Neck:     Thyroid : No thyromegaly.     Vascular: No JVD.     Trachea: No tracheal deviation.  Cardiovascular:     Rate and Rhythm: Normal rate and regular rhythm.     Heart sounds: Normal heart sounds.  Pulmonary:     Effort: No respiratory distress.     Breath sounds: No stridor. No wheezing.  Abdominal:     General: Bowel sounds are normal. There is no distension.     Palpations: Abdomen is soft. There is no mass.     Tenderness: There is no abdominal tenderness. There is no guarding or rebound.  Musculoskeletal:        General: No tenderness.     Cervical back: Normal range of motion and neck supple. No rigidity.     Right lower leg: No edema.     Left lower leg: No edema.  Lymphadenopathy:     Cervical: No cervical adenopathy.  Skin:    Findings: No erythema or rash.  Neurological:     Cranial Nerves: No cranial nerve deficit.     Motor: No abnormal muscle tone.     Coordination: Coordination normal.     Deep Tendon Reflexes: Reflexes normal.  Psychiatric:        Behavior: Behavior normal.        Thought Content: Thought content normal.        Judgment: Judgment normal.   Appears tired  Lab Results  Component Value Date   WBC 4.3 10/31/2023   HGB 13.0 10/31/2023   HCT 39.1 10/31/2023   PLT 293.0 10/31/2023   GLUCOSE 95  10/31/2023   CHOL 200 10/31/2023   TRIG 60.0 10/31/2023   HDL 73.30 10/31/2023   LDLCALC 115 (H) 10/31/2023   ALT 10 10/31/2023   AST 12 10/31/2023   NA 139 10/31/2023  K 4.2 10/31/2023   CL 106 10/31/2023   CREATININE 0.86 10/31/2023   BUN 13 10/31/2023   CO2 28 10/31/2023   TSH 1.72 10/31/2023   HGBA1C 5.8 10/31/2023    MM 3D DIAGNOSTIC MAMMOGRAM UNILATERAL LEFT BREAST Result Date: 04/03/2024 CLINICAL DATA:  Recall for possible LEFT breast asymmetry and calcifications. Prior history of multiple LEFT breast cyst excisions in 2014. No personal or family history of breast cancer. EXAM: DIGITAL DIAGNOSTIC UNILATERAL LEFT MAMMOGRAM WITH TOMOSYNTHESIS AND CAD; ULTRASOUND LEFT BREAST LIMITED TECHNIQUE: Left digital diagnostic mammography and breast tomosynthesis was performed. The images were evaluated with computer-aided detection. ; Targeted ultrasound examination of the left breast was performed. COMPARISON:  Previous exam(s). ACR Breast Density Category c: The breasts are heterogeneously dense, which may obscure small masses. FINDINGS: LEFT: Mammogram: Full field ML, magnification CC and ML, and spot compression MLO and CC views of the LEFT best were obtained. Oval mass with indistinct margins persists in the posterior depth of the inner LEFT breast. Two calcifications persist within the inner LEFT breast on the magnification views, 1 of which is round and the other is layering, consistent with benign etiology. Ultrasound: Targeted sonographic evaluation of the LEFT breast demonstrates an oval anechoic circumscribed mass measuring 1.6 x 0.4 x 1.0 cm (9 o'clock 4 CMFN), which corresponds to the mammographic mass. This is consistent with a benign simple cyst. IMPRESSION: 1. No evidence of LEFT breast malignancy. 2. Benign 1.6 cm simple cyst of the inner LEFT breast. 3. Benign-appearing calcifications of the inner LEFT breast. RECOMMENDATION: Screening mammogram in one year.(Code:SM-B-01Y) I have  discussed the findings and recommendations with the patient. If applicable, a reminder letter will be sent to the patient regarding the next appointment. BI-RADS CATEGORY  2: Benign. Electronically Signed   By: Aliene Lloyd M.D.   On: 04/03/2024 14:08   US  LIMITED ULTRASOUND INCLUDING AXILLA LEFT BREAST  Result Date: 04/03/2024 CLINICAL DATA:  Recall for possible LEFT breast asymmetry and calcifications. Prior history of multiple LEFT breast cyst excisions in 2014. No personal or family history of breast cancer. EXAM: DIGITAL DIAGNOSTIC UNILATERAL LEFT MAMMOGRAM WITH TOMOSYNTHESIS AND CAD; ULTRASOUND LEFT BREAST LIMITED TECHNIQUE: Left digital diagnostic mammography and breast tomosynthesis was performed. The images were evaluated with computer-aided detection. ; Targeted ultrasound examination of the left breast was performed. COMPARISON:  Previous exam(s). ACR Breast Density Category c: The breasts are heterogeneously dense, which may obscure small masses. FINDINGS: LEFT: Mammogram: Full field ML, magnification CC and ML, and spot compression MLO and CC views of the LEFT best were obtained. Oval mass with indistinct margins persists in the posterior depth of the inner LEFT breast. Two calcifications persist within the inner LEFT breast on the magnification views, 1 of which is round and the other is layering, consistent with benign etiology. Ultrasound: Targeted sonographic evaluation of the LEFT breast demonstrates an oval anechoic circumscribed mass measuring 1.6 x 0.4 x 1.0 cm (9 o'clock 4 CMFN), which corresponds to the mammographic mass. This is consistent with a benign simple cyst. IMPRESSION: 1. No evidence of LEFT breast malignancy. 2. Benign 1.6 cm simple cyst of the inner LEFT breast. 3. Benign-appearing calcifications of the inner LEFT breast. RECOMMENDATION: Screening mammogram in one year.(Code:SM-B-01Y) I have discussed the findings and recommendations with the patient. If applicable, a reminder  letter will be sent to the patient regarding the next appointment. BI-RADS CATEGORY  2: Benign. Electronically Signed   By: Aliene Lloyd M.D.   On: 04/03/2024 14:08  Assessment & Plan:   Problem List Items Addressed This Visit     Anxiety disorder   Chronic anxiety.  History of remote [place] attack - PTSD Discussed      Insomnia disorder   Intolerant of zolpidem - eating in her sleep Sonata  did not help Lorazepam  is not working now Will try Restoril       Migraine headache   Severe migraine headache.  Nurtec sample given.  Toradol  and Zofran  injections given Medrol  DosePack      Snoring   Install sleep app on the phone.  Check for abnormalities/sleep apnea Sleep consult was suggested      Vitamin D  deficiency   Continue with vitamin D       Other Visit Diagnoses       Immunization due    -  Primary   Relevant Orders   Flu vaccine trivalent PF, 6mos and older(Flulaval,Afluria,Fluarix,Fluzone) (Completed)         Meds ordered this encounter  Medications   temazepam  (RESTORIL ) 15 MG capsule    Sig: Take 1-2 capsules (15-30 mg total) by mouth at bedtime as needed for sleep.    Dispense:  60 capsule    Refill:  3      Follow-up: Return in about 3 months (around 08/05/2024) for a follow-up visit.  Marolyn Noel, MD

## 2024-05-06 NOTE — Assessment & Plan Note (Signed)
 Chronic anxiety.  History of remote [place] attack - PTSD Discussed

## 2024-05-06 NOTE — Assessment & Plan Note (Signed)
 Continue with vitamin D

## 2024-05-06 NOTE — Assessment & Plan Note (Signed)
 Severe migraine headache.  Nurtec sample given.  Toradol  and Zofran  injections given Medrol  DosePack

## 2024-05-06 NOTE — Assessment & Plan Note (Signed)
 Intolerant of zolpidem - eating in her sleep Sonata  did not help Lorazepam  is not working now Will try Restoril 

## 2024-05-06 NOTE — Assessment & Plan Note (Signed)
 Install sleep app on the phone.  Check for abnormalities/sleep apnea Sleep consult was suggested

## 2024-05-11 ENCOUNTER — Other Ambulatory Visit (HOSPITAL_COMMUNITY)
Admission: RE | Admit: 2024-05-11 | Discharge: 2024-05-11 | Disposition: A | Discharge status: Home / Self Care | Payer: Self-pay | Source: Ambulatory Visit | Attending: Medical Genetics | Admitting: Medical Genetics

## 2024-05-12 NOTE — Progress Notes (Unsigned)
 [name] [address] [place] [address] Phone: [phone]   Assessment and Plan:     1. Neck pain (Primary) 2. DDD (degenerative disc disease), cervical 3. Cervical radiculopathy 4. Somatic dysfunction of cervical region 5. Somatic dysfunction of thoracic region 6. Somatic dysfunction of rib region -Chronic with exacerbation, subsequent visit - Overall significant improvement in neck pain and radicular symptoms in the right shoulder after epidural CSI to right sided C7-T1 performed on 01/29/2024 - Patient received >3 months relief from epidural CSI, 80% relief from CSI, decreased need for pain medication, improved function.  Patient's symptoms are gradually returning, so I believe patient would benefit from repeat epidural CSI to right sided C7-T1.  Order placed - May continue NSAIDs as needed for day-to-day pain relief - May continue tizanidine  4 mg daily as needed for muscle spasms - Patient has received relief with OMT in the past.  Elects for repeat OMT today.  Tolerated well per note below. - Decision today to treat with OMT was based on Physical Exam  After verbal consent patient was treated with HVLA (high velocity low amplitude), ME (muscle energy), FPR (flex positional release), ST (soft tissue), techniques in cervical, rib, thoracic,   areas. Patient tolerated the procedure well with improvement in symptoms.  Patient educated on potential side effects of soreness and recommended to rest, hydrate, and use Tylenol  as needed for pain control.   Pertinent previous records reviewed include epidural procedure note 01/29/2024   Follow Up: 6 to 8 weeks for reevaluation.  Would review benefit of repeat epidural.  Could consider repeat OMT   Subjective:   I, Angel [name] am a scribe for Dr. Leonce.    Chief Complaint: neck pain   HPI:  10/31/2023 Trigger point injections given today. Discussed icing regimen and home  exercises hopefully this will be significantly beneficial. Does have the partial tearing noted. Discussed icing regimen and home exercises. Follow-up again in 6 to 8 weeks    Update 01/08/2024 Angel [name] is a 48 y.o. female coming in with complaint of R shoulder pain. Injections here did help and pain returned a couple of weeks ago. Pain over top of arm and into the R scapula. Pain can radiate down into upper arm.   05/13/2024 Patient states that the shoulder is not bad today. It feels like more of the neck and a little bit in the upper back. Did notice a lot of relief with the injection. It really helped a lot.   Relevant Historical Information: None pertinent  Additional pertinent review of systems negative.   Current Outpatient Medications:    acetaminophen  (TYLENOL ) 500 MG tablet, Take 1,000 mg by mouth daily. , Disp: , Rfl:    albuterol  (VENTOLIN  HFA) 108 (90 Base) MCG/ACT inhaler, INHALE 2 PUFFS INTO THE LUNGS EVERY 6 HOURS AS NEEDED FOR WHEEZING OR SHORTNESS OF BREATH, Disp: 6.7 g, Rfl: 3   betamethasone dipropionate 0.05 % cream, Apply 1 application topically daily as needed (Eczema). , Disp: , Rfl:    clindamycin (CLINDAGEL) 1 % gel, Apply 1 application topically daily as needed (acne). , Disp: , Rfl: 11   diclofenac  (CATAFLAM ) 50 MG tablet, Take 1 tablet (50 mg total) by mouth 3 (three) times daily as needed., Disp: 90 tablet, Rfl: 3   doxycycline  (VIBRAMYCIN ) 50 MG capsule, SMARTSIG:1-2 Capsule(s) By Mouth Every Evening, Disp: , Rfl:    hydrocortisone  (ANUSOL -HC) 2.5 % rectal cream, Use bid, Disp: 30  g, Rfl: 1   ibuprofen  (ADVIL ) 600 MG tablet, 1 po tid pc prn pain, Disp: 60 tablet, Rfl: 3   ipratropium (ATROVENT ) 0.06 % nasal spray, Place 2 sprays into both nostrils 2 (two) times daily., Disp: , Rfl:    methylPREDNISolone  (MEDROL  DOSEPAK) 4 MG TBPK tablet, As directed, Disp: 21 tablet, Rfl: 0   Multiple Vitamin (MULTI-VITAMIN) tablet, Take 1 tablet by mouth daily., Disp: ,  Rfl:    ondansetron  (ZOFRAN ) 4 MG tablet, Take 1 tablet (4 mg total) by mouth every 8 (eight) hours as needed for nausea or vomiting., Disp: 20 tablet, Rfl: 0   phentermine  (ADIPEX-P ) 37.5 MG tablet, Take 1 tablet (37.5 mg total) by mouth daily before breakfast., Disp: 30 tablet, Rfl: 2   promethazine  (PHENERGAN ) 25 MG tablet, TAKE 1 TABLET BY MOUTH EVERY 4 HOURS AS NEEDED FOR NAUSEA/VOMITING, Disp: 30 tablet, Rfl: 3   Rimegepant Sulfate (NURTEC) 75 MG TBDP, 1 po every other day for prophylaxis, Disp: 15 tablet, Rfl: 11   rizatriptan  (MAXALT ) 10 MG tablet, Take 1 tablet (10 mg total) by mouth daily as needed for migraine. May repeat in 2 hours if needed, Disp: 12 tablet, Rfl: 12   Semaglutide -Weight Management (WEGOVY ) 0.25 MG/0.5ML SOAJ, Inject 0.25 mg into the skin once a week., Disp: 2 mL, Rfl: 2   spironolactone (ALDACTONE) 50 MG tablet, Take 50 mg by mouth daily., Disp: , Rfl:    temazepam  (RESTORIL ) 15 MG capsule, Take 1-2 capsules (15-30 mg total) by mouth at bedtime as needed for sleep., Disp: 60 capsule, Rfl: 3   tiZANidine  (ZANAFLEX ) 4 MG tablet, Take 4 mg by mouth daily as needed (Back pain). , Disp: , Rfl:    traMADol  (ULTRAM ) 50 MG tablet, Take 1-2 tablets (50-100 mg total) by mouth every 6 (six) hours as needed for severe pain (pain score 7-10) or moderate pain (pain score 4-6)., Disp: 20 tablet, Rfl: 1   tretinoin (RETIN-A) 0.025 % cream, Apply topically at bedtime., Disp: , Rfl:    triamcinolone  ointment (KENALOG ) 0.1 %, Apply 1 Application topically 2 (two) times daily., Disp: , Rfl:    Vitamin D , Ergocalciferol , 50000 units CAPS, TAKE ONE CAPSULE BY MOUTH EVERY MONTH, Disp: 3 capsule, Rfl: 3   Objective:     Vitals:   05/13/24 1306  BP: 128/60  Pulse: 81  SpO2: 100%  Weight: 197 lb (89.4 kg)  Height: 5' 6 (1.676 m)      Body mass index is 31.8 kg/m.    Physical Exam:    General: Well-appearing, cooperative, sitting comfortably in no acute distress.   OMT Physical  Exam:   Cervical: TTP paraspinal, C3-5 RLSL Rib: Bilateral elevated first rib with TTP, worse on right Thoracic: TTP paraspinal/trapezius/rhomboid/levator, T3-5 RRSL, T6-8 RLSR    Electronically signed by:  Odis Mace D.CLEMENTEEN [name] 1:25 PM 05/13/24

## 2024-05-13 ENCOUNTER — Ambulatory Visit: Admitting: Sports Medicine

## 2024-05-13 VITALS — BP 128/60 | HR 81 | Ht 66.0 in | Wt 197.0 lb

## 2024-05-13 DIAGNOSIS — M9902 Segmental and somatic dysfunction of thoracic region: Secondary | ICD-10-CM

## 2024-05-13 DIAGNOSIS — M9901 Segmental and somatic dysfunction of cervical region: Secondary | ICD-10-CM

## 2024-05-13 DIAGNOSIS — M542 Cervicalgia: Secondary | ICD-10-CM

## 2024-05-13 DIAGNOSIS — M5412 Radiculopathy, cervical region: Secondary | ICD-10-CM | POA: Diagnosis not present

## 2024-05-13 DIAGNOSIS — M9908 Segmental and somatic dysfunction of rib cage: Secondary | ICD-10-CM

## 2024-05-13 DIAGNOSIS — M5030 Other cervical disc degeneration, unspecified cervical region: Secondary | ICD-10-CM

## 2024-05-13 DIAGNOSIS — L658 Other specified nonscarring hair loss: Secondary | ICD-10-CM | POA: Diagnosis not present

## 2024-05-13 NOTE — Patient Instructions (Signed)
 Epidural Right sided c7-t1 Follow up in 6 to 8 weeks.

## 2024-05-19 LAB — GENECONNECT MOLECULAR SCREEN: Genetic Analysis Overall Interpretation: NEGATIVE

## 2024-05-22 NOTE — Telephone Encounter (Signed)
 I spoke to patient and gave her the name of the 3 medications insurance prefers and she said she will do research and send a MyChart message on which option she wants to try.   Curtistine Quiet, CMA

## 2024-06-01 ENCOUNTER — Other Ambulatory Visit

## 2024-06-11 NOTE — Progress Notes (Signed)
 Angel [name] [facility] [address] [place] [address] Phone: [phone] Subjective:   Angel [name], am serving as a scribe for Dr. Arthea [name].  I'm seeing this patient by the request  of:  Plotnikov, Angel GAILS, MD  CC: Right shoulder pain, neck pain  YEP:Dlagzrupcz  01/08/2024 Chronic problem with exacerbation.  Discussed icing regimen and home exercises.  Discussed that I do think some of this could be secondary to some cervical radiculopathy.  Concerning because this continues to come back.  At this point I would like to consider the possibility of advanced imaging of the neck with a x-ray and MRI.  Depending on findings could be a candidate for possible epidurals.  We will discuss with patient once we have imaging to further evaluate and discuss.     Updated 06/15/2024 Angel [name] is a 48 y.o. female coming in with complaint of R shoulder pain. Here for MSK. Shoulder is about the same. Rescheduling for epidural.      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 Compliance was encouraged.     Past Surgical History:  Procedure Laterality Date   BILATERAL SALPINGECTOMY  09/26/2015   Procedure: BILATERAL SALPINGECTOMY;  Surgeon: Angel Rummer, MD;  Location: [facility];  Service: Gynecology;;   BREAST BIOPSY Bilateral 07/13/2013   Procedure: BILATERAL EXCISION BREAS MASSES;  Surgeon: Angel DELENA Poli, MD;  Location: [facility];  Service: General;  Laterality: Bilateral;   BREAST CYST EXCISION Left 2014   4 cysts removed   DE QUERVAIN'S RELEASE Right 09/06/2003   DILITATION & CURRETTAGE/HYSTROSCOPY WITH NOVASURE ABLATION N/A 09/26/2015   Procedure: DILATATION & CURETTAGE/HYSTEROSCOPY WITH NOVASURE ABLATION;  Surgeon: Angel Rummer, MD;  Location: [facility];  Service: Gynecology;  Laterality: N/A;   LAPAROSCOPIC OVARIAN CYSTECTOMY Right 09/26/2015   Procedure: LAPAROSCOPIC Right OVARIAN CYSTECTOMY, Bilateral Peritoneal Biopsies over Uteralsacral area;  Surgeon: Angel Rummer, MD;  Location: [facility];  Service: Gynecology;  Laterality: Right;   LEFT HEART CATH AND CORONARY ANGIOGRAPHY N/A 12/30/2019   Procedure: LEFT HEART CATH AND CORONARY ANGIOGRAPHY;  Surgeon: Angel Alm ORN, MD;  Location: [facility];  Service: Cardiovascular;  Laterality: N/A;   MASS EXCISION Right 10/07/2023   Procedure: EXCISION RIGHT AXILLARY  MASS;  Surgeon: [name] Vicenta, MD;  Location: [facility];  Service: General;  Laterality: Right;   SPINAL FUSION  07/05/2018  TUBAL LIGATION  12/27/2005   WISDOM TOOTH EXTRACTION     WRIST GANGLION EXCISION Right 09/06/2003   WRIST GANGLION EXCISION Left 09/04/1999   Social History   Socioeconomic History   Marital status: Married    Spouse name: Not on file   Number of children: 2   Years of education: Not on file   Highest education level: Not on file   Occupational History   Not on file  Tobacco Use   Smoking status: Never   Smokeless tobacco: Never  Vaping Use   Vaping status: Never Used  Substance and Sexual Activity   Alcohol use: No   Drug use: No   Sexual activity: Not on file    Comment: BTL  Other Topics Concern   Not on file  Social History Narrative   Not on file   Social Drivers of Health   Financial Resource Strain: Not on file  Food Insecurity: No Food Insecurity (08/30/2021)   Received from [facility]   Hunger Vital Sign    Within the past 12 months, you worried that your food would run out before you got the money to buy more.: Never true    Within the past 12 months, the food you bought just didn't last and you didn't have money to get more.: Never true  Transportation Needs: Not on file  Physical Activity: Not on file  Stress: Not on file  Social Connections: Unknown (12/10/2021)   Received from [facility]   Social Network    Social Network: Not on file   Allergies  Allergen Reactions   Other Dermatitis    Surgical glues Surgical glues   Butalbital -Acetaminophen      Did not like the effect   Lunesta  [Eszopiclone ]     Bad taste    Trazodone  And Nefazodone    Wound Dressing Adhesive     Other reaction(s): Rash:itching   Wound Dressings Other (See Comments)    Other reaction(s): Rash:itching   Zolpidem      Ate in her sleep   Family History  Problem Relation Age of Onset   Hypertension Father    Diabetes Father    Heart disease Father    Cancer Father        prostate   Sickle cell anemia Daughter    Sickle cell anemia Son    Birth defects Maternal Grandfather        prostate cancer    Current Outpatient Medications (Endocrine & Metabolic):    methylPREDNISolone  (MEDROL  DOSEPAK) 4 MG TBPK tablet, As directed  Current Outpatient Medications (Cardiovascular):    spironolactone (ALDACTONE) 50 MG tablet, Take 50 mg by mouth daily.  Current Outpatient Medications (Respiratory):     albuterol  (VENTOLIN  HFA) 108 (90 Base) MCG/ACT inhaler, INHALE 2 PUFFS INTO THE LUNGS EVERY 6 HOURS AS NEEDED FOR WHEEZING OR SHORTNESS OF BREATH   ipratropium (ATROVENT ) 0.06 % nasal spray, Place 2 sprays into both nostrils 2 (two) times daily.   promethazine  (PHENERGAN ) 25 MG tablet, TAKE 1 TABLET BY MOUTH EVERY 4 HOURS AS NEEDED FOR NAUSEA/VOMITING  Current Outpatient Medications (Analgesics):    acetaminophen  (TYLENOL ) 500 MG tablet, Take 1,000 mg by mouth daily.    diclofenac  (CATAFLAM ) 50 MG tablet, Take 1 tablet (50 mg total) by mouth 3 (three) times daily as needed.   ibuprofen  (ADVIL ) 600 MG tablet, 1 po tid pc prn pain   Rimegepant Sulfate (NURTEC) 75 MG TBDP, 1 po every other day for prophylaxis   rizatriptan  (MAXALT )  10 MG tablet, Take 1 tablet (10 mg total) by mouth daily as needed for migraine. May repeat in 2 hours if needed   traMADol  (ULTRAM ) 50 MG tablet, Take 1-2 tablets (50-100 mg total) by mouth every 6 (six) hours as needed for severe pain (pain score 7-10) or moderate pain (pain score 4-6).   Current Outpatient Medications (Other):    betamethasone dipropionate 0.05 % cream, Apply 1 application topically daily as needed (Eczema).    clindamycin (CLINDAGEL) 1 % gel, Apply 1 application topically daily as needed (acne).    doxycycline  (VIBRAMYCIN ) 50 MG capsule, SMARTSIG:1-2 Capsule(s) By Mouth Every Evening   hydrocortisone  (ANUSOL -HC) 2.5 % rectal cream, Use bid   Multiple Vitamin (MULTI-VITAMIN) tablet, Take 1 tablet by mouth daily.   ondansetron  (ZOFRAN ) 4 MG tablet, Take 1 tablet (4 mg total) by mouth every 8 (eight) hours as needed for nausea or vomiting.   phentermine  (ADIPEX-P ) 37.5 MG tablet, Take 1 tablet (37.5 mg total) by mouth daily before breakfast.   Semaglutide -Weight Management (WEGOVY ) 0.25 MG/0.5ML SOAJ, Inject 0.25 mg into the skin once a week.   temazepam  (RESTORIL ) 15 MG capsule, Take 1-2 capsules (15-30 mg total) by mouth at bedtime as needed for  sleep.   tiZANidine  (ZANAFLEX ) 4 MG tablet, Take 4 mg by mouth daily as needed (Back pain).    tretinoin (RETIN-A) 0.025 % cream, Apply topically at bedtime.   triamcinolone  ointment (KENALOG ) 0.1 %, Apply 1 Application topically 2 (two) times daily.   Vitamin D , Ergocalciferol , 50000 units CAPS, TAKE ONE CAPSULE BY MOUTH EVERY MONTH   Reviewed prior external information including notes and imaging from  primary care provider As well as notes that were available from care everywhere and other healthcare systems.  Past medical history, social, surgical and family history all reviewed in electronic medical record.  No pertanent information unless stated regarding to the chief complaint.   Review of Systems:  No headache, visual changes, nausea, vomiting, diarrhea, constipation, dizziness, abdominal pain, skin rash, fevers, chills, night sweats, weight loss, swollen lymph nodes, body aches, joint swelling, chest pain, shortness of breath, mood changes. POSITIVE muscle aches  Objective  Blood pressure 108/70, pulse 83, height 5' 6 (1.676 m), SpO2 98%.   General: No apparent distress alert and oriented x3 mood and affect normal, dressed appropriately.  HEENT: Pupils equal, extraocular movements intact  Respiratory: Patient's speak in full sentences and does not appear short of breath  Cardiovascular: No lower extremity edema, non tender, no erythema  Right shoulder exam does not have any significant loss of lordosis.  Patient does have some limited sidebending bilaterally.   Osteopathic findings C2 flexed rotated and side bent right C4 flexed rotated and side bent left C6 flexed rotated and side bent left T3 extended rotated and side bent right inhaled third rib T9 extended rotated and side bent left L2 flexed rotated and side bent right Sacrum right on right    Impression and Recommendations:  Slipped rib syndrome Attempted osteopathic manipulation after further evaluation.   Discussed which activities to do which ones to avoid.  Increase activity slowly.  Discussed icing regimen.  Follow-up again in 6 to 12 weeks.  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  Is responding well though to osteopathic manipulation.  Increase activity slowly.  Follow-up with me again 6 to 12 weeks otherwise.  Discussed the possibility of another epidural which patient will consider.    Decision today to  treat with OMT was based on Physical Exam  After verbal consent patient was treated with  ME, FPR techniques in cervical, thoracic, rib, lumbar and sacral areas, all areas are chronic   Patient tolerated the procedure well with improvement in symptoms  Patient given exercises, stretches and lifestyle modifications  See medications in patient instructions if given  Patient will follow up in 4-8 weeks   The above documentation has been reviewed and is accurate and complete Timesha Cervantez M Liat Mayol, DO

## 2024-06-15 ENCOUNTER — Encounter: Payer: Self-pay | Admitting: Family Medicine

## 2024-06-15 ENCOUNTER — Ambulatory Visit: Admitting: Family Medicine

## 2024-06-15 VITALS — BP 108/70 | HR 83 | Ht 66.0 in

## 2024-06-15 DIAGNOSIS — M5030 Other cervical disc degeneration, unspecified cervical region: Secondary | ICD-10-CM | POA: Diagnosis not present

## 2024-06-15 DIAGNOSIS — M9908 Segmental and somatic dysfunction of rib cage: Secondary | ICD-10-CM | POA: Diagnosis not present

## 2024-06-15 DIAGNOSIS — M9903 Segmental and somatic dysfunction of lumbar region: Secondary | ICD-10-CM | POA: Diagnosis not present

## 2024-06-15 DIAGNOSIS — M940 Chondrocostal junction syndrome [Tietze]: Secondary | ICD-10-CM | POA: Diagnosis not present

## 2024-06-15 DIAGNOSIS — M9904 Segmental and somatic dysfunction of sacral region: Secondary | ICD-10-CM

## 2024-06-15 DIAGNOSIS — M9902 Segmental and somatic dysfunction of thoracic region: Secondary | ICD-10-CM

## 2024-06-15 DIAGNOSIS — M9901 Segmental and somatic dysfunction of cervical region: Secondary | ICD-10-CM | POA: Diagnosis not present

## 2024-06-15 NOTE — Patient Instructions (Addendum)
 Good to see you! See you again in 3 months [facility] [phone]

## 2024-06-15 NOTE — Assessment & Plan Note (Signed)
 Attempted osteopathic manipulation after further evaluation.  Discussed which activities to do which ones to avoid.  Increase activity slowly.  Discussed icing regimen.  Follow-up again in 6 to 12 weeks.

## 2024-06-15 NOTE — Assessment & Plan Note (Signed)
 Discussed with patient again.  Is responding well though to osteopathic manipulation.  Increase activity slowly.  Follow-up with me again 6 to 12 weeks otherwise.  Discussed the possibility of another epidural which patient will consider.

## 2024-07-16 NOTE — Discharge Instructions (Signed)

## 2024-07-17 ENCOUNTER — Inpatient Hospital Stay
Admission: RE | Admit: 2024-07-17 | Discharge: 2024-07-17 | Disposition: A | Source: Ambulatory Visit | Attending: Sports Medicine

## 2024-07-17 DIAGNOSIS — M542 Cervicalgia: Secondary | ICD-10-CM

## 2024-07-17 DIAGNOSIS — M4722 Other spondylosis with radiculopathy, cervical region: Secondary | ICD-10-CM | POA: Diagnosis not present

## 2024-07-17 DIAGNOSIS — M5030 Other cervical disc degeneration, unspecified cervical region: Secondary | ICD-10-CM

## 2024-07-17 DIAGNOSIS — M5412 Radiculopathy, cervical region: Secondary | ICD-10-CM

## 2024-07-17 MED ORDER — IOPAMIDOL (ISOVUE-M 300) INJECTION 61%
1.0000 mL | Freq: Once | INTRAMUSCULAR | Status: AC | PRN
  Administered 2024-07-17: 1 mL via EPIDURAL

## 2024-07-17 MED ORDER — TRIAMCINOLONE ACETONIDE 40 MG/ML IJ SUSP (RADIOLOGY)
60.0000 mg | Freq: Once | INTRAMUSCULAR | Status: AC
  Administered 2024-07-17: 60 mg via EPIDURAL

## 2024-08-19 ENCOUNTER — Encounter: Payer: Self-pay | Admitting: Internal Medicine

## 2024-08-19 ENCOUNTER — Ambulatory Visit: Admitting: Internal Medicine

## 2024-08-19 VITALS — BP 96/62 | HR 76 | Ht 66.0 in | Wt 203.2 lb

## 2024-08-19 DIAGNOSIS — G4700 Insomnia, unspecified: Secondary | ICD-10-CM

## 2024-08-19 DIAGNOSIS — E6609 Other obesity due to excess calories: Secondary | ICD-10-CM | POA: Diagnosis not present

## 2024-08-19 DIAGNOSIS — Z6833 Body mass index (BMI) 33.0-33.9, adult: Secondary | ICD-10-CM | POA: Diagnosis not present

## 2024-08-19 DIAGNOSIS — R252 Cramp and spasm: Secondary | ICD-10-CM

## 2024-08-19 DIAGNOSIS — E559 Vitamin D deficiency, unspecified: Secondary | ICD-10-CM | POA: Diagnosis not present

## 2024-08-19 DIAGNOSIS — E66811 Obesity, class 1: Secondary | ICD-10-CM

## 2024-08-19 MED ORDER — TRIAZOLAM 0.125 MG PO TABS: 0.1250 mg | ORAL_TABLET | Freq: Every evening | ORAL | 0 refills | Status: AC | PRN

## 2024-08-19 MED ORDER — VITAMIN D (ERGOCALCIFEROL) 50000 UNITS PO CAPS: ORAL_CAPSULE | ORAL | 3 refills | Status: AC

## 2024-08-19 MED ORDER — RYBELSUS 3 MG PO TABS: 3.0000 mg | ORAL_TABLET | Freq: Every day | ORAL | 5 refills | Status: AC

## 2024-08-19 NOTE — Progress Notes (Signed)
 "  Subjective:  Patient ID: Angel [name], female    DOB: 1976/06/07  Age: 49 y.o. MRN: [id number]  CC: Medical Management of Chronic Issues (3 Month follow up)   HPI Odella VEAR Gin presents for leg cramps - bad over the holidays, resolved... C/o insomnia - hard to wake up after Temazepam ... C/o being overweight -she would like to have a prescription for oral Wegovy   Outpatient Medications Prior to Visit  Medication Sig Dispense Refill   acetaminophen  (TYLENOL ) 500 MG tablet Take 1,000 mg by mouth daily.      albuterol  (VENTOLIN  HFA) 108 (90 Base) MCG/ACT inhaler INHALE 2 PUFFS INTO THE LUNGS EVERY 6 HOURS AS NEEDED FOR WHEEZING OR SHORTNESS OF BREATH 6.7 g 3   betamethasone dipropionate 0.05 % cream Apply 1 application topically daily as needed (Eczema).      clindamycin (CLINDAGEL) 1 % gel Apply 1 application topically daily as needed (acne).   11   diclofenac  (CATAFLAM ) 50 MG tablet Take 1 tablet (50 mg total) by mouth 3 (three) times daily as needed. 90 tablet 3   doxycycline  (VIBRAMYCIN ) 50 MG capsule SMARTSIG:1-2 Capsule(s) By Mouth Every Evening     hydrocortisone  (ANUSOL -HC) 2.5 % rectal cream Use bid 30 g 1   ibuprofen  (ADVIL ) 600 MG tablet 1 po tid pc prn pain 60 tablet 3   ipratropium (ATROVENT ) 0.06 % nasal spray Place 2 sprays into both nostrils 2 (two) times daily.     Multiple Vitamin (MULTI-VITAMIN) tablet Take 1 tablet by mouth daily.     ondansetron  (ZOFRAN ) 4 MG tablet Take 1 tablet (4 mg total) by mouth every 8 (eight) hours as needed for nausea or vomiting. 20 tablet 0   rizatriptan  (MAXALT ) 10 MG tablet Take 1 tablet (10 mg total) by mouth daily as needed for migraine. May repeat in 2 hours if needed 12 tablet 12   spironolactone (ALDACTONE) 50 MG tablet Take 50 mg by mouth daily.     tretinoin (RETIN-A) 0.025 % cream Apply topically at bedtime.     triamcinolone  ointment (KENALOG ) 0.1 % Apply 1 Application topically 2 (two) times daily.     temazepam  (RESTORIL )  15 MG capsule Take 1-2 capsules (15-30 mg total) by mouth at bedtime as needed for sleep. 60 capsule 3   Vitamin D , Ergocalciferol , 50000 units CAPS TAKE ONE CAPSULE BY MOUTH EVERY MONTH 3 capsule 3   methylPREDNISolone  (MEDROL  DOSEPAK) 4 MG TBPK tablet As directed (Patient not taking: Reported on 08/19/2024) 21 tablet 0   phentermine  (ADIPEX-P ) 37.5 MG tablet Take 1 tablet (37.5 mg total) by mouth daily before breakfast. (Patient not taking: Reported on 08/19/2024) 30 tablet 2   promethazine  (PHENERGAN ) 25 MG tablet TAKE 1 TABLET BY MOUTH EVERY 4 HOURS AS NEEDED FOR NAUSEA/VOMITING (Patient not taking: Reported on 08/19/2024) 30 tablet 3   Rimegepant Sulfate (NURTEC) 75 MG TBDP 1 po every other day for prophylaxis (Patient not taking: Reported on 08/19/2024) 15 tablet 11   tiZANidine  (ZANAFLEX ) 4 MG tablet Take 4 mg by mouth daily as needed (Back pain).  (Patient not taking: Reported on 08/19/2024)     traMADol  (ULTRAM ) 50 MG tablet Take 1-2 tablets (50-100 mg total) by mouth every 6 (six) hours as needed for severe pain (pain score 7-10) or moderate pain (pain score 4-6). (Patient not taking: Reported on 08/19/2024) 20 tablet 1   Semaglutide -Weight Management (WEGOVY ) 0.25 MG/0.5ML SOAJ Inject 0.25 mg into the skin once a week. (Patient not taking: Reported on  08/19/2024) 2 mL 2   No facility-administered medications prior to visit.    ROS: Review of Systems  Constitutional:  Positive for fatigue and unexpected weight change. Negative for activity change, appetite change and chills.  HENT:  Negative for congestion, mouth sores and sinus pressure.   Eyes:  Negative for visual disturbance.  Respiratory:  Negative for apnea, cough and chest tightness.   Cardiovascular:  Negative for chest pain and leg swelling.  Gastrointestinal:  Negative for abdominal pain and nausea.  Genitourinary:  Negative for difficulty urinating, frequency and vaginal pain.  Musculoskeletal:  Positive for myalgias. Negative for  arthralgias, back pain and gait problem.  Skin:  Negative for pallor and rash.  Neurological:  Negative for dizziness, tremors, weakness, numbness and headaches.  Psychiatric/Behavioral:  Positive for sleep disturbance. Negative for confusion and suicidal ideas. The patient is nervous/anxious.     Objective:  BP 96/62   Pulse 76   Ht 5' 6 (1.676 m)   Wt 203 lb 3.2 oz (92.2 kg)   SpO2 98%   BMI 32.80 kg/m   BP Readings from Last 3 Encounters:  08/19/24 96/62  07/17/24 121/74  06/15/24 108/70    Wt Readings from Last 3 Encounters:  08/19/24 203 lb 3.2 oz (92.2 kg)  05/13/24 197 lb (89.4 kg)  05/06/24 194 lb (88 kg)    Physical Exam Constitutional:      General: She is not in acute distress.    Appearance: She is well-developed. She is obese.  HENT:     Head: Normocephalic.     Right Ear: External ear normal.     Left Ear: External ear normal.     Nose: Nose normal.  Eyes:     General:        Right eye: No discharge.        Left eye: No discharge.     Conjunctiva/sclera: Conjunctivae normal.     Pupils: Pupils are equal, round, and reactive to light.  Neck:     Thyroid : No thyromegaly.     Vascular: No JVD.     Trachea: No tracheal deviation.  Cardiovascular:     Rate and Rhythm: Normal rate and regular rhythm.     Heart sounds: Normal heart sounds.  Pulmonary:     Effort: No respiratory distress.     Breath sounds: No stridor. No wheezing.  Abdominal:     General: Bowel sounds are normal. There is no distension.     Palpations: Abdomen is soft. There is no mass.     Tenderness: There is no abdominal tenderness. There is no guarding or rebound.  Musculoskeletal:        General: No tenderness.     Cervical back: Normal range of motion and neck supple. No rigidity.  Lymphadenopathy:     Cervical: No cervical adenopathy.  Skin:    Findings: No erythema or rash.  Neurological:     Cranial Nerves: No cranial nerve deficit.     Motor: No abnormal muscle tone.      Coordination: Coordination normal.     Deep Tendon Reflexes: Reflexes normal.  Psychiatric:        Behavior: Behavior normal.        Thought Content: Thought content normal.        Judgment: Judgment normal.     Lab Results  Component Value Date   WBC 4.3 10/31/2023   HGB 13.0 10/31/2023   HCT 39.1 10/31/2023   PLT 293.0 10/31/2023  GLUCOSE 95 10/31/2023   CHOL 200 10/31/2023   TRIG 60.0 10/31/2023   HDL 73.30 10/31/2023   LDLCALC 115 (H) 10/31/2023   ALT 10 10/31/2023   AST 12 10/31/2023   NA 139 10/31/2023   K 4.2 10/31/2023   CL 106 10/31/2023   CREATININE 0.86 10/31/2023   BUN 13 10/31/2023   CO2 28 10/31/2023   TSH 1.72 10/31/2023   HGBA1C 5.8 10/31/2023    DG INJECT DIAG/THERA/INC NEEDLE/CATH/PLC EPI/CERV/THOR W/IMG Result Date: 07/17/2024 CLINICAL DATA:  49 year old female with cervical spondylosis without myelopathy. She has neck pain radiating into the right shoulder. She experienced prior relief from an epidural steroid injection in June of this year but presents with recurrent symptoms. FLUOROSCOPY: Radiation Exposure Index (as provided by the fluoroscopic device): 2.5 mGy Kerma PROCEDURE: CERVICAL EPIDURAL INJECTION An interlaminar approach was performed on the right at C7-T1. A 20 gauge epidural needle was advanced using loss-of-resistance technique. DIAGNOSTIC EPIDURAL INJECTION Injection of Isovue -M 300 shows a good epidural pattern with spread above and below the level of needle placement, primarily on the right. No vascular opacification is seen. THERAPEUTIC EPIDURAL INJECTION 1.5 ml of Kenalog  40 mixed with 2 ml of normal saline were then instilled. The procedure was well-tolerated, and the patient was discharged thirty minutes following the injection in good condition. IMPRESSION: Technically successful epidural injection on the right at C7-T1. Electronically Signed   By: Wilkie Lent M.D.   On: 07/17/2024 14:32    Assessment & Plan:   Problem List  Items Addressed This Visit     Vitamin D  deficiency - Primary   Continue with vitamin D  monthly      Insomnia disorder   Intolerant of zolpidem - eating in her sleep Sonata  did not help Lorazepam  is not working now d/c Temazepam  is working, but it is hard to wake up after Temazepam ... Will try Halcion  0.125-0.25 mg tablets at at bedtime instead.  Potential benefits of a long term benzodiazepines  use as well as potential risks  and complications were explained to the patient and were aknowledged.       Obesity   Discussed.  Will try oral Wegovy  3 mg daily.  Advance the dose up if tolerated      Relevant Medications   Semaglutide  (RYBELSUS ) 3 MG TABS   Leg cramps   Severe cramps in both legs.  Unclear etiology.  Resolved Can try Benadryl, Tylenol  PM as needed if relapsed         Meds ordered this encounter  Medications   triazolam  (HALCION ) 0.125 MG tablet    Sig: Take 1-2 tablets (0.125-0.25 mg total) by mouth at bedtime as needed for sleep.    Dispense:  60 tablet    Refill:  0   Semaglutide  (RYBELSUS ) 3 MG TABS    Sig: Take 1 tablet (3 mg total) by mouth daily.    Dispense:  30 tablet    Refill:  5   Vitamin D , Ergocalciferol , 50000 units CAPS    Sig: TAKE ONE CAPSULE BY MOUTH EVERY MONTH    Dispense:  3 capsule    Refill:  3      Follow-up: Return in about 3 months (around 11/17/2024) for a follow-up visit.  Marolyn Noel, MD "

## 2024-08-19 NOTE — Assessment & Plan Note (Addendum)
 Severe cramps in both legs.  Unclear etiology.  Resolved Can try Benadryl, Tylenol  PM as needed if relapsed

## 2024-08-19 NOTE — Assessment & Plan Note (Addendum)
 Intolerant of zolpidem - eating in her sleep Sonata  did not help Lorazepam  is not working now d/c Temazepam  is working, but it is hard to wake up after Temazepam ... Will try Halcion  0.125-0.25 mg tablets at at bedtime instead.  Potential benefits of a long term benzodiazepines  use as well as potential risks  and complications were explained to the patient and were aknowledged.

## 2024-08-19 NOTE — Assessment & Plan Note (Signed)
 Discussed.  Will try oral Wegovy  3 mg daily.  Advance the dose up if tolerated

## 2024-08-19 NOTE — Assessment & Plan Note (Addendum)
 Continue with vitamin D  monthly

## 2024-08-24 ENCOUNTER — Other Ambulatory Visit: Payer: Self-pay | Admitting: Internal Medicine

## 2024-09-10 NOTE — Progress Notes (Unsigned)
 " [facility] [address] [place] [address] Phone: [phone] Subjective:   ISusannah Gully, am serving as a scribe for Dr. Arthea Claudene.  I'm seeing this patient by the request  of:  Plotnikov, Karlynn GAILS, MD  CC: Back and neck pain follow-up  YEP:Dlagzrupcz  LORELI DEBRULER is a 49 y.o. female coming in with complaint of back and neck pain. OMT 06/15/2024. Patient states doing better than usual. Still some discomfort. No new symptoms.  Medications patient has been prescribed: None  Taking:         Reviewed prior external information including notes and imaging from previsou exam, outside providers and external EMR if available.   As well as notes that were available from care everywhere and other healthcare systems.  Past medical history, social, surgical and family history all reviewed in electronic medical record.  No pertanent information unless stated regarding to the chief complaint.   Past Medical History:  Diagnosis Date   Arthritis    Atypical squamous cells of undetermined significance (ASC-US ) on cervical Pap smear 10/30/2019   Bloating symptom 04/30/2012   9/13 2 hrs long cramping episodes associated w/diarrhea x 3 episodes R/o a viral illness, GS, pancreatitis and other dx's 9/18 gluten free diet   Breast lump 06/08/2013   Breast mass 06/2013   bilateral   Cardiac murmur 09/04/2019   Cataract    Cyst of right ovary 10/30/2019   Degeneration of intervertebral disc of cervical spine without prolapsed disc 08/21/2017   DOE (dyspnea on exertion) 06/10/2019   GERD (gastroesophageal reflux disease) 08/2014   takes Protonix  daily   Herniated disc    History of anemia 10/30/2019   History of dysmenorrhea 10/30/2019   History of hematuria 10/30/2019   Insomnia 10/07/2013   Chronic    Irritant dermatitis 11/17/2014   Migraine headache    [place]mer[place] 10/30/2019   Nausea and vomiting 12/07/2022   Overweight 09/04/2019    Sickle cell trait    Vitamin D  deficiency 2009   Vitamin D  deficiency 09/03/2007   Chronic 2/14 Risks associated with treatment noncompliance were discussed. Compliance was encouraged.      Allergies[1]   Review of Systems:  No headache, visual changes, nausea, vomiting, diarrhea, constipation, dizziness, abdominal pain, skin rash, fevers, chills, night sweats, weight loss, swollen lymph nodes, body aches, joint swelling, chest pain, shortness of breath, mood changes. POSITIVE muscle aches  Objective  Blood pressure 120/76, pulse 79, height 5' 6 (1.676 m), weight 207 lb (93.9 kg), SpO2 99%.   General: No apparent distress alert and oriented x3 mood and affect normal, dressed appropriately.  HEENT: Pupils equal, extraocular movements intact  Respiratory: Patient's speak in full sentences and does not appear short of breath  Cardiovascular: No lower extremity edema, non tender, no erythema  Gait MSK:  Back does have some loss of lordosis noted.  Some tenderness to palpation very minorly over at the cervical thoracic area.  Mild limitation in sidebending bilaterally.  Osteopathic findings  C2 flexed rotated and side bent right C6 flexed rotated and side bent right L2 flexed rotated and side bent right L4 flexed rotated sidebent left Sacrum right on right    Assessment and Plan:  Neck pain Significant improvement after the epidural.  No other significant changes in management at this time.  Responds extremely well though to osteopathic manipulation.  Follow-up again in 6 to 8 weeks    Nonallopathic problems  Decision today to treat  with OMT was based on Physical Exam  After verbal consent patient was treated with HVLA, ME, FPR techniques in cervical, rib, thoracic, lumbar, and sacral  areas avoided HVLA on the cervical spine  Patient tolerated the procedure well with improvement in symptoms  Patient given exercises, stretches and lifestyle modifications  See medications in  patient instructions if given  Patient will follow up in 4-8 weeks     The above documentation has been reviewed and is accurate and complete Arthea CHRISTELLA Sharps, DO         Note: This dictation was prepared with Dragon dictation along with smaller phrase technology. Any transcriptional errors that result from this process are unintentional.            [1]  Allergies Allergen Reactions   Other Dermatitis    Surgical glues Surgical glues   Butalbital -Acetaminophen      Did not like the effect   Lunesta  [Eszopiclone ]     Bad taste    Trazodone  And Nefazodone    Wound Dressing Adhesive     Other reaction(s): Rash:itching   Wound Dressings Other (See Comments)    Other reaction(s): Rash:itching   Zolpidem      Ate in her sleep   "

## 2024-09-16 ENCOUNTER — Ambulatory Visit: Admitting: Family Medicine

## 2024-09-16 ENCOUNTER — Encounter: Payer: Self-pay | Admitting: Family Medicine

## 2024-09-16 VITALS — BP 120/76 | HR 79 | Ht 66.0 in | Wt 207.0 lb

## 2024-09-16 DIAGNOSIS — M9903 Segmental and somatic dysfunction of lumbar region: Secondary | ICD-10-CM

## 2024-09-16 DIAGNOSIS — M9908 Segmental and somatic dysfunction of rib cage: Secondary | ICD-10-CM

## 2024-09-16 DIAGNOSIS — M9904 Segmental and somatic dysfunction of sacral region: Secondary | ICD-10-CM

## 2024-09-16 DIAGNOSIS — M9901 Segmental and somatic dysfunction of cervical region: Secondary | ICD-10-CM

## 2024-09-16 DIAGNOSIS — M542 Cervicalgia: Secondary | ICD-10-CM

## 2024-09-16 DIAGNOSIS — M9902 Segmental and somatic dysfunction of thoracic region: Secondary | ICD-10-CM

## 2024-09-16 NOTE — Patient Instructions (Signed)
 Good to see you.  So proud of you! Doing amazing. Lets check back in in 3 months.

## 2024-09-16 NOTE — Assessment & Plan Note (Signed)
 Significant improvement after the epidural.  No other significant changes in management at this time.  Responds extremely well though to osteopathic manipulation.  Follow-up again in 6 to 8 weeks

## 2024-09-18 ENCOUNTER — Other Ambulatory Visit: Payer: Self-pay

## 2024-09-18 MED ORDER — WEGOVY 1.5 MG PO TABS: 1.5000 mg | ORAL_TABLET | Freq: Every day | ORAL | 2 refills | Status: AC

## 2024-11-17 ENCOUNTER — Ambulatory Visit: Admitting: Internal Medicine

## 2024-12-16 ENCOUNTER — Ambulatory Visit: Admitting: Family Medicine

## 2025-02-04 ENCOUNTER — Encounter: Admitting: Internal Medicine
